# Patient Record
Sex: Female | Born: 1940
Health system: Southern US, Community
[De-identification: ages and names within clinical notes are randomized; demographics above are authoritative.]

## PROBLEM LIST (undated history)

## (undated) DIAGNOSIS — I509 Heart failure, unspecified: Secondary | ICD-10-CM

## (undated) DIAGNOSIS — L570 Actinic keratosis: Secondary | ICD-10-CM

## (undated) DIAGNOSIS — I10 Essential (primary) hypertension: Secondary | ICD-10-CM

## (undated) DIAGNOSIS — J449 Chronic obstructive pulmonary disease, unspecified: Secondary | ICD-10-CM

## (undated) DIAGNOSIS — I2510 Atherosclerotic heart disease of native coronary artery without angina pectoris: Secondary | ICD-10-CM

## (undated) DIAGNOSIS — M1990 Unspecified osteoarthritis, unspecified site: Secondary | ICD-10-CM

## (undated) DIAGNOSIS — C801 Malignant (primary) neoplasm, unspecified: Secondary | ICD-10-CM

## (undated) DIAGNOSIS — J45909 Unspecified asthma, uncomplicated: Secondary | ICD-10-CM

## (undated) DIAGNOSIS — F32A Depression, unspecified: Secondary | ICD-10-CM

## (undated) DIAGNOSIS — F329 Major depressive disorder, single episode, unspecified: Secondary | ICD-10-CM

## (undated) HISTORY — DX: Depression, unspecified: F32.A

## (undated) HISTORY — DX: Unspecified osteoarthritis, unspecified site: M19.90

## (undated) HISTORY — DX: Major depressive disorder, single episode, unspecified: F32.9

## (undated) HISTORY — DX: Actinic keratosis: L57.0

## (undated) HISTORY — DX: Unspecified asthma, uncomplicated: J45.909

## (undated) HISTORY — PX: JOINT REPLACEMENT: SHX530

## (undated) HISTORY — DX: Malignant (primary) neoplasm, unspecified: C80.1

## (undated) HISTORY — PX: CORONARY ARTERY BYPASS GRAFT: SHX141

---

## 1968-05-20 HISTORY — PX: OVARIAN CYST REMOVAL: SHX89

## 1968-05-20 HISTORY — PX: TUBAL LIGATION: SHX77

## 1988-05-20 DIAGNOSIS — C801 Malignant (primary) neoplasm, unspecified: Secondary | ICD-10-CM

## 1988-05-20 HISTORY — PX: BREAST SURGERY: SHX581

## 1988-05-20 HISTORY — DX: Malignant (primary) neoplasm, unspecified: C80.1

## 2017-01-06 LAB — BASIC METABOLIC PANEL
BUN: 15 (ref 4–21)
Creatinine: 0.8 (ref 0.5–1.1)
Glucose: 119
Potassium: 4.6 (ref 3.4–5.3)
Sodium: 140 (ref 137–147)

## 2017-03-17 LAB — TSH: TSH: 2.31 (ref 0.41–5.90)

## 2017-03-17 LAB — VITAMIN D 25 HYDROXY (VIT D DEFICIENCY, FRACTURES): Vit D, 25-Hydroxy: 30

## 2017-03-24 LAB — PTH, INTACT: PTH, Intact: 72

## 2018-04-07 ENCOUNTER — Encounter (INDEPENDENT_AMBULATORY_CARE_PROVIDER_SITE_OTHER): Payer: Self-pay

## 2018-04-07 ENCOUNTER — Ambulatory Visit (INDEPENDENT_AMBULATORY_CARE_PROVIDER_SITE_OTHER): Payer: PPO | Admitting: Family Medicine

## 2018-04-07 ENCOUNTER — Encounter: Payer: Self-pay | Admitting: Family Medicine

## 2018-04-07 VITALS — BP 138/84 | HR 64 | Temp 97.7°F | Resp 16 | Ht 61.0 in | Wt 176.2 lb

## 2018-04-07 DIAGNOSIS — I10 Essential (primary) hypertension: Secondary | ICD-10-CM | POA: Diagnosis not present

## 2018-04-07 DIAGNOSIS — L989 Disorder of the skin and subcutaneous tissue, unspecified: Secondary | ICD-10-CM | POA: Diagnosis not present

## 2018-04-07 DIAGNOSIS — Z9109 Other allergy status, other than to drugs and biological substances: Secondary | ICD-10-CM | POA: Diagnosis not present

## 2018-04-07 DIAGNOSIS — Z853 Personal history of malignant neoplasm of breast: Secondary | ICD-10-CM

## 2018-04-07 DIAGNOSIS — I2510 Atherosclerotic heart disease of native coronary artery without angina pectoris: Secondary | ICD-10-CM

## 2018-04-07 DIAGNOSIS — J4530 Mild persistent asthma, uncomplicated: Secondary | ICD-10-CM

## 2018-04-07 DIAGNOSIS — F339 Major depressive disorder, recurrent, unspecified: Secondary | ICD-10-CM | POA: Diagnosis not present

## 2018-04-07 DIAGNOSIS — Z23 Encounter for immunization: Secondary | ICD-10-CM

## 2018-04-07 HISTORY — DX: Personal history of malignant neoplasm of breast: Z85.3

## 2018-04-07 MED ORDER — AZELASTINE HCL 0.15 % NA SOLN: 1.0000 | Freq: Every day | NASAL | 1 refills | Status: DC | PRN

## 2018-04-07 MED ORDER — ALBUTEROL SULFATE HFA 108 (90 BASE) MCG/ACT IN AERS: 2.0000 | INHALATION_SPRAY | RESPIRATORY_TRACT | 2 refills | Status: DC | PRN

## 2018-04-07 MED ORDER — LISINOPRIL 20 MG PO TABS: 20.0000 mg | ORAL_TABLET | Freq: Two times a day (BID) | ORAL | 3 refills | Status: DC

## 2018-04-07 MED ORDER — ALBUTEROL SULFATE (2.5 MG/3ML) 0.083% IN NEBU: 2.5000 mg | INHALATION_SOLUTION | Freq: Three times a day (TID) | RESPIRATORY_TRACT | 2 refills | Status: DC | PRN

## 2018-04-07 MED ORDER — FLUTICASONE FUROATE-VILANTEROL 200-25 MCG/INH IN AEPB: 1.0000 | INHALATION_SPRAY | Freq: Every day | RESPIRATORY_TRACT | 5 refills | Status: DC

## 2018-04-07 MED ORDER — TIOTROPIUM BROMIDE MONOHYDRATE 2.5 MCG/ACT IN AERS: 2.0000 | INHALATION_SPRAY | Freq: Every day | RESPIRATORY_TRACT | 3 refills | Status: DC

## 2018-04-07 MED ORDER — SERTRALINE HCL 25 MG PO TABS: 25.0000 mg | ORAL_TABLET | Freq: Every day | ORAL | 3 refills | Status: DC

## 2018-04-07 NOTE — Assessment & Plan Note (Signed)
Not currently on Statin. Will discuss at next visit

## 2018-04-07 NOTE — Assessment & Plan Note (Addendum)
When on controllers does well - no need for rescue. Worse with any illness or allergy trigger. Moved due to possible need for O2 in [place]. Refill medications. Obtain Pulm records. Pulse Ox 98% on RA

## 2018-04-07 NOTE — Assessment & Plan Note (Signed)
Declined any screening.

## 2018-04-07 NOTE — Patient Instructions (Addendum)
You will get a phone call from our referral coordinator   It was great to meet you today!  Restart your Zoloft  If thoughts about suicide you said you would 1. Talk to your daughter Dennis Bast can also call the clinic or the Suicide Hotline

## 2018-04-07 NOTE — Assessment & Plan Note (Addendum)
Off medication x 3 months and noticing symptoms returning. Restart zoloft. Has safety plan. Return in 6 weeks for re-evaluation

## 2018-04-07 NOTE — Progress Notes (Signed)
Subjective:     Nancy Merritt is a 77 y.o. female presenting for [place] (previous PCP in [place].) and Medication Refill (needs all of her medications refilled)     HPI  #Asthma/COPD - was living with daughter and the altitude of [place] and her doctor wanted to put her on O2 but she didn't want that - can walk 1/2 mile - stairs are difficult - was seeing a specialist - but has reached a stable medication and feels  - doing well on controllers  #HTN #S/p CABG - went to the ER 2 times for chest pain which was non-cardiac related - no HA - has been more than a year since ER visit - had a normal cardiac work-up through the ER - no recent visits to cardiology >1 year ago - not currently on statin - will discuss at next visit or review records    #Sore on back - started 3-4 years ago - started like pimple, thought ingrown hair - never healed - seems to be growing in size - irritating, but no painful - more itchy - no hx of skin cancer for self or family  #depression - has been out of medication and would like to restart - no thoughts of wanting to hurt herself  Review of Systems  Constitutional: Negative for chills and fever.  HENT: Negative for congestion and sore throat.   Eyes: Negative.   Respiratory: Positive for cough. Negative for chest tightness and shortness of breath.   Cardiovascular: Negative for chest pain, palpitations and leg swelling.  Gastrointestinal: Negative.   Endocrine: Negative.   Genitourinary: Negative.   Musculoskeletal: Negative.   Skin: Positive for wound (see HPI).  Allergic/Immunologic: Negative.   Neurological: Negative for dizziness and light-headedness.  Hematological: Bruises/bleeds easily (skin is thinner).  Psychiatric/Behavioral: Negative for confusion and decreased concentration. The patient is nervous/anxious (depressed mood).    Past Medical History:  Diagnosis Date  . Arthritis   . Asthma   .  Cancer ([place]) 1990  . Depression    Past Surgical History:  Procedure Laterality Date  . BREAST SURGERY Left 1990  . CORONARY ARTERY BYPASS GRAFT    . JOINT REPLACEMENT Right    Hip  . OVARIAN CYST REMOVAL  1970  . TUBAL LIGATION  1970   Family History  Problem Relation Age of Onset  . Cancer Father        lung  . Heart attack Father 27  . Alcohol abuse Father   . Arthritis Father   . COPD Father   . Diabetes Father   . Hypertension Father   . Breast cancer Sister 54  . Arthritis Sister   . Cancer Sister   . COPD Sister   . Drug abuse Sister   . Hypertension Sister   . Miscarriages / Stillbirths Sister   . Thyroid disease Sister   . Arthritis Mother   . COPD Mother   . Diabetes Mother   . Hearing loss Mother   . Hypertension Mother   . Miscarriages / [place] Mother   . Stroke Mother   . Alcohol abuse Brother   . Arthritis Brother   . Hypertension Brother   . Arthritis Daughter   . Asthma Daughter   . Depression Daughter   . Hypertension Daughter   . Miscarriages / [place] Daughter   . Thyroid disease Daughter   . Arthritis Maternal Grandmother   . Hypertension Maternal Grandmother   . Stroke Maternal Grandmother   .  Thyroid disease Maternal Grandmother   . Alcohol abuse Maternal Grandfather   . Arthritis Maternal Grandfather   . Cancer Maternal Grandfather   . Depression Maternal Grandfather   . Hearing loss Maternal Grandfather   . Hypertension Maternal Grandfather   . Arthritis Sister   . Thyroid disease Sister   . Thyroid disease Daughter   . Hypertension Daughter   . Depression Daughter   . COPD Daughter   . Alcohol abuse Daughter   . Arthritis Daughter   . Arthritis Daughter   . COPD Daughter   . Depression Daughter   . Hearing loss Daughter   . Hypertension Daughter   . Thyroid disease Daughter     Social History   Tobacco Use  . Smoking status: Former Smoker    Packs/day: 1.00    Years: 40.00    Pack years: 40.00    Types:  Cigarettes    Last attempt to quit: 2002    Years since quitting: 17.8  . Smokeless tobacco: Never Used  Substance Use Topics  . Alcohol use: Not Currently  . Drug use: Never       Objective:    BP Readings from Last 3 Encounters:  04/07/18 138/84   Wt Readings from Last 3 Encounters:  04/07/18 176 lb 4 oz (79.9 kg)    BP 138/84   Pulse 64   Temp 97.7 F (36.5 C)   Resp 16   Ht 5\' 1"  (1.549 m)   Wt 176 lb 4 oz (79.9 kg)   SpO2 98%   BMI 33.30 kg/m    Physical Exam  Constitutional: She appears well-developed and well-nourished. No distress.  HENT:  Head: Normocephalic and atraumatic.  Eyes: Conjunctivae and EOM are normal. No scleral icterus.  Neck: Neck supple.  Cardiovascular: Normal rate and regular rhythm.  No murmur heard. Pulmonary/Chest: Effort normal. No respiratory distress. She has wheezes (scattered expiratory).  Neurological: She is alert.  Skin: Skin is warm and dry. Capillary refill takes less than 2 seconds. She is not diaphoretic.  ~1.5 x 3 cm red to pink lesion with some areas of poor healing.   Psychiatric: She has a normal mood and affect.      Office Visit from 04/07/2018 in [facility] at [facility]  PHQ-9 Total Score  18           Assessment & Plan:   Problem List Items Addressed This Visit      Cardiovascular and Mediastinum   Hypertension   Relevant Medications   lisinopril (PRINIVIL,ZESTRIL) 20 MG tablet   CAD (coronary artery disease)    Not currently on Statin. Will discuss at next visit      Relevant Medications   lisinopril (PRINIVIL,ZESTRIL) 20 MG tablet     Respiratory   Asthma - Primary    When on controllers does well - no need for rescue. Worse with any illness or allergy trigger. Moved due to possible need for O2 in [place]. Refill medications. Obtain Pulm records. Pulse Ox 98% on RA       Relevant Medications   albuterol (PROVENTIL HFA;VENTOLIN HFA) 108 (90 Base) MCG/ACT inhaler   albuterol  (PROVENTIL) (2.5 MG/3ML) 0.083% nebulizer solution   fluticasone furoate-vilanterol (BREO ELLIPTA) 200-25 MCG/INH AEPB   Tiotropium Bromide Monohydrate (SPIRIVA RESPIMAT) 2.5 MCG/ACT AERS     Musculoskeletal and Integument   Skin lesion of back    Concerning for BCC. Referral to derm      Relevant Orders  Ambulatory referral to Dermatology     Other   Depression, recurrent ([place])    Off medication x 3 months and noticing symptoms returning. Restart zoloft. Has safety plan. Return in 6 weeks for re-evaluation      Relevant Medications   sertraline (ZOLOFT) 25 MG tablet   Hx of breast cancer    Declined any screening.       Other Visit Diagnoses    Environmental allergies       Relevant Medications   Azelastine HCl 0.15 % SOLN   Need for immunization against influenza       Relevant Orders   Flu vaccine HIGH DOSE PF (Fluzone High dose) (Completed)       Return in about 6 weeks (around 05/19/2018) for depression.  Lesleigh Noe, MD

## 2018-04-07 NOTE — Assessment & Plan Note (Signed)
Concerning for BCC. Referral to derm

## 2018-04-13 ENCOUNTER — Encounter: Payer: Self-pay | Admitting: Family Medicine

## 2018-04-13 DIAGNOSIS — E210 Primary hyperparathyroidism: Secondary | ICD-10-CM | POA: Insufficient documentation

## 2018-04-15 ENCOUNTER — Telehealth: Payer: Self-pay | Admitting: Family Medicine

## 2018-04-15 NOTE — Telephone Encounter (Signed)
Rec'd from [facility] forwarded 29 pages to Dr. Lesleigh Noe MD

## 2018-04-20 LAB — CALCIUM: CALCIUM: 10.8

## 2018-05-26 ENCOUNTER — Encounter: Payer: Self-pay | Admitting: Family Medicine

## 2018-05-26 ENCOUNTER — Ambulatory Visit (INDEPENDENT_AMBULATORY_CARE_PROVIDER_SITE_OTHER): Payer: PPO | Admitting: Family Medicine

## 2018-05-26 VITALS — BP 128/72 | HR 70 | Temp 97.7°F | Ht 61.0 in | Wt 174.8 lb

## 2018-05-26 DIAGNOSIS — I10 Essential (primary) hypertension: Secondary | ICD-10-CM

## 2018-05-26 DIAGNOSIS — F339 Major depressive disorder, recurrent, unspecified: Secondary | ICD-10-CM | POA: Diagnosis not present

## 2018-05-26 DIAGNOSIS — J4530 Mild persistent asthma, uncomplicated: Secondary | ICD-10-CM

## 2018-05-26 MED ORDER — SERTRALINE HCL 25 MG PO TABS: 25.0000 mg | ORAL_TABLET | Freq: Every day | ORAL | 3 refills | Status: DC

## 2018-05-26 NOTE — Progress Notes (Signed)
Subjective:     Nancy Merritt is a 78 y.o. female presenting for Depression (6 week follow up)     HPI  #Depression - feels like the zoloft is working well - no side effects  - has noticed improvement, and feels better on this dose - stopped it for a few years successfully but symptoms returned  #Asthma - has noticed more cough in the wet climate - is needing her rescue inhaler more often - does not feel that she has a cold - is using the rescue 1 time a day  #Sore on shoulder - has derm appointment in February   Review of Systems  HENT: Negative for congestion, sinus pain and sneezing.   Respiratory: Positive for cough. Negative for shortness of breath.      04/07/2018: Clinic - Restarted zoloft for depression  Social History   Tobacco Use  Smoking Status Former Smoker  . Packs/day: 1.00  . Years: 40.00  . Pack years: 40.00  . Types: Cigarettes  . Last attempt to quit: 2002  . Years since quitting: 18.0  Smokeless Tobacco Never Used        Objective:    BP Readings from Last 3 Encounters:  05/26/18 128/72  04/07/18 138/84   Wt Readings from Last 3 Encounters:  05/26/18 174 lb 12 oz (79.3 kg)  04/07/18 176 lb 4 oz (79.9 kg)    BP 128/72   Pulse 70   Temp 97.7 F (36.5 C)   Ht 5\' 1"  (1.549 m)   Wt 174 lb 12 oz (79.3 kg)   SpO2 98%   BMI 33.02 kg/m    Physical Exam Constitutional:      General: She is not in acute distress.    Appearance: She is well-developed. She is not diaphoretic.  HENT:     Right Ear: External ear normal.     Left Ear: External ear normal.     Nose: Nose normal.  Eyes:     Conjunctiva/sclera: Conjunctivae normal.  Neck:     Musculoskeletal: Neck supple.  Cardiovascular:     Rate and Rhythm: Normal rate and regular rhythm.     Heart sounds: Murmur present.  Pulmonary:     Effort: Pulmonary effort is normal. No respiratory distress.     Breath sounds: Wheezing and rhonchi present. No rales.  Skin:  General: Skin is warm and dry.     Capillary Refill: Capillary refill takes less than 2 seconds.  Neurological:     Mental Status: She is alert. Mental status is at baseline.  Psychiatric:        Mood and Affect: Mood normal.        Behavior: Behavior normal.       Depression screen [facility] 2/9 05/26/2018 04/07/2018  Decreased Interest 0 3  Down, Depressed, Hopeless 0 3  PHQ - 2 Score 0 6  Altered sleeping 1 3  Tired, decreased energy 2 3  Change in appetite 0 3  Feeling bad or failure about yourself  0 1  Trouble concentrating 1 2  Moving slowly or fidgety/restless 0 0  Suicidal thoughts 0 0  PHQ-9 Score 4 18  Difficult doing work/chores Somewhat difficult Somewhat difficult       Assessment & Plan:   Problem List Items Addressed This Visit      Cardiovascular and Mediastinum   Hypertension    BP at goal on current medication.         Respiratory   Asthma -  Primary    Worsening symptoms as in new environment. Previously followed by pulm with potential need for O2. SpO2 is good today. Will refer. Also discussed trial of increased albuterol x 1-2 days to see if that helps with symptoms. No sign of exacerbation.       Relevant Orders   Ambulatory referral to Pulmonology     Other   Depression, recurrent ([place])    Significant improvement with PHQ-9 from 18>4 since starting medication. Refill provided.       Relevant Medications   sertraline (ZOLOFT) 25 MG tablet       Return in about 6 months (around 11/24/2018).  Lesleigh Noe, MD

## 2018-05-26 NOTE — Assessment & Plan Note (Signed)
Worsening symptoms as in new environment. Previously followed by pulm with potential need for O2. SpO2 is good today. Will refer. Also discussed trial of increased albuterol x 1-2 days to see if that helps with symptoms. No sign of exacerbation.

## 2018-05-26 NOTE — Patient Instructions (Signed)
#  Depression  - I'm glad you are doing better with the zoloft, continue taking the medication  #Asthma - try using albuterol 3 times a day for the next 1-2 days to see if that helps - pulmonology referral -- go see the coordinator today  #Blood pressure - looks great! Keep taking the medication  #Lesion on shoulder - I'm glad you have a dermatology appointment - have them send records from your visit to me

## 2018-05-26 NOTE — Assessment & Plan Note (Signed)
BP at goal on current medication.

## 2018-05-26 NOTE — Assessment & Plan Note (Signed)
Significant improvement with PHQ-9 from 18>4 since starting medication. Refill provided.

## 2018-05-27 ENCOUNTER — Telehealth: Payer: Self-pay

## 2018-05-27 ENCOUNTER — Ambulatory Visit (INDEPENDENT_AMBULATORY_CARE_PROVIDER_SITE_OTHER): Payer: PPO | Admitting: Pulmonary Disease

## 2018-05-27 ENCOUNTER — Encounter: Payer: Self-pay | Admitting: Pulmonary Disease

## 2018-05-27 VITALS — BP 120/80 | HR 92 | Ht 61.0 in | Wt 175.4 lb

## 2018-05-27 DIAGNOSIS — J4530 Mild persistent asthma, uncomplicated: Secondary | ICD-10-CM | POA: Diagnosis not present

## 2018-05-27 MED ORDER — FLUTICASONE PROPIONATE HFA 44 MCG/ACT IN AERO: 2.0000 | INHALATION_SPRAY | Freq: Two times a day (BID) | RESPIRATORY_TRACT | 5 refills | Status: DC | PRN

## 2018-05-27 NOTE — Telephone Encounter (Signed)
Reached out to [facility] office and they do not have a record of Bone density, colonoscopy or mammogram records.

## 2018-05-27 NOTE — Patient Instructions (Addendum)
Mild Persistent Asthma Uncontrolled symptoms. Not in active exacerbation.   --CONTINUE Breo 200-25 mcg one puff daily --CONTINUE Spiriva Respimat 2.5 mcg two puffs twice daily --START Flovent 44 mcg 2 puffs as needed up to twice a day for shortness of breath or wheezing --OK TO CONTINUE Albuterol inhaler 2 puffs every 4 hours as needed for shortness of breath or wheezing  Please call if you are using your inhalers as instructed but are still having worsening symptoms.

## 2018-05-27 NOTE — Progress Notes (Signed)
Synopsis: Referred in 05/2018 for asthma  Subjective:   [name] GENDER: female DOB: February 12, 1941, MRN: [id number]   HPI  Chief Complaint  Patient presents with  . Consult    hx asthma - moved here recently from [place] - noticed more coughing since moving here   Nancy Merritt is a 78 year old female former smoker with childhood asthma, hypertension and depression who presents as a new consult for asthma.  She recently moved from [place] to [place] and is experiencing worsening asthma symptoms. Has been on Breo and Spiriva since 1.5-2 years and seemed to effective before she moved.  Does not have symptoms in the morning. During the day she starts having mild wheezing and productive cough with thin yellow secretions. No shortness of breath. Worsens with rainy weather, humidity. She uses her albuterol once a day with relief. Reports post nasal drip. She takes claritin. No exacerbations in the last year.  Social History: 40-pack-year smoking history.  Quit 18 years ago  Environmental exposures: None  2019 Jan Feb March April May June July Aug Sept Oct Nov Dec                2020 Feb Feb March April May June July Aug Sept Oct Nov Dec                 ACT-2  I have personally reviewed patient's past medical/family/social history, allergies, current medications.  Past Medical History:  Diagnosis Date  . Arthritis   . Asthma   . Cancer ([place]) 1990  . Depression      Family History  Problem Relation Age of Onset  . Cancer Father        lung  . Heart attack Father 5  . Alcohol abuse Father   . Arthritis Father   . COPD Father   . Diabetes Father   . Hypertension Father   . Breast cancer Sister 61  . Arthritis Sister   . Cancer Sister   . COPD Sister   . Drug abuse Sister   . Hypertension Sister   . Miscarriages / Stillbirths Sister   . Thyroid disease Sister   . Arthritis Mother   . COPD Mother   . Diabetes Mother   . Hearing loss Mother     . Hypertension Mother   . Miscarriages / [place] Mother   . Stroke Mother   . Alcohol abuse Brother   . Arthritis Brother   . Hypertension Brother   . Arthritis Daughter   . Asthma Daughter   . Depression Daughter   . Hypertension Daughter   . Miscarriages / [place] Daughter   . Thyroid disease Daughter   . Arthritis Maternal Grandmother   . Hypertension Maternal Grandmother   . Stroke Maternal Grandmother   . Thyroid disease Maternal Grandmother   . Alcohol abuse Maternal Grandfather   . Arthritis Maternal Grandfather   . Cancer Maternal Grandfather   . Depression Maternal Grandfather   . Hearing loss Maternal Grandfather   . Hypertension Maternal Grandfather   . Arthritis Sister   . Thyroid disease Sister   . Thyroid disease Daughter   . Hypertension Daughter   . Depression Daughter   . COPD Daughter   . Alcohol abuse Daughter   . Arthritis Daughter   . Arthritis Daughter   . COPD Daughter   . Depression Daughter   . Hearing loss Daughter   . Hypertension Daughter   . Thyroid disease Daughter  Social History   Occupational History  . Not on file  Tobacco Use  . Smoking status: Former Smoker    Packs/day: 1.00    Years: 40.00    Pack years: 40.00    Types: Cigarettes    Last attempt to quit: 2002    Years since quitting: 18.0  . Smokeless tobacco: Never Used  Substance and Sexual Activity  . Alcohol use: Not Currently  . Drug use: Never  . Sexual activity: Not Currently    Allergies  Allergen Reactions  . Advair Diskus [Fluticasone-Salmeterol] Shortness Of Breath    As of 09/01/2014  . Levofloxacin     Nausea, brain fog, depression, as of 12/12/14  . Qvar [Beclomethasone]     Lungs closing-as of 05/03/2015  . Singulair [Montelukast] Shortness Of Breath  . Symbicort [Budesonide-Formoterol Fumarate]     Hard time breathing as of 10/31/2014.     Outpatient Medications Prior to Visit  Medication Sig Dispense Refill  . albuterol  (PROVENTIL HFA;VENTOLIN HFA) 108 (90 Base) MCG/ACT inhaler Inhale 2 puffs into the lungs every 4 (four) hours as needed for wheezing or shortness of breath. 1 Inhaler 2  . albuterol (PROVENTIL) (2.5 MG/3ML) 0.083% nebulizer solution Take 3 mLs (2.5 mg total) by nebulization 3 (three) times daily as needed. 75 mL 2  . Azelastine HCl 0.15 % SOLN Place 1 spray into the nose daily as needed. 30 mL 1  . Flaxseed, Linseed, (FLAX SEEDS PO) Take 1 tablet by mouth daily.    . fluticasone furoate-vilanterol (BREO ELLIPTA) 200-25 MCG/INH AEPB Inhale 1 puff into the lungs daily. 60 each 5  . ibuprofen (ADVIL,MOTRIN) 200 MG tablet Take 200 mg by mouth every 6 (six) hours as needed.    Marland Kitchen lisinopril (PRINIVIL,ZESTRIL) 20 MG tablet Take 1 tablet (20 mg total) by mouth 2 (two) times daily. 180 tablet 3  . loratadine (CLARITIN) 10 MG tablet Take 10 mg by mouth daily.    . Multiple Vitamins-Minerals (CENTRUM SILVER PO) Take 1 tablet by mouth daily.    . sertraline (ZOLOFT) 25 MG tablet Take 1 tablet (25 mg total) by mouth daily. 90 tablet 3  . Tiotropium Bromide Monohydrate (SPIRIVA RESPIMAT) 2.5 MCG/ACT AERS Inhale 2 puffs into the lungs daily. 3 Inhaler 3   No facility-administered medications prior to visit.     Review of Systems  Constitutional: Negative for chills, diaphoresis, fever, malaise/fatigue and weight loss.  HENT: Positive for congestion. Negative for sore throat.   Respiratory: Positive for cough, sputum production, shortness of breath and wheezing. Negative for hemoptysis.   Cardiovascular: Negative for chest pain, orthopnea, leg swelling and PND.  Gastrointestinal: Negative for abdominal pain, heartburn and nausea.  Genitourinary: Negative for frequency.  Musculoskeletal: Negative for myalgias.  Skin: Negative for rash.  Neurological: Negative for dizziness, weakness and headaches.  Endo/Heme/Allergies: Does not bruise/bleed easily.     Objective:   Vitals:   05/27/18 1339  BP: 120/80   Pulse: 92  SpO2: 93%  Weight: 175 lb 6.4 oz (79.6 kg)  Height: 5\' 1"  (1.549 m)   SpO2: 93 % O2 Device: None (Room air)  Physical Exam General: Well-appearing, no acute distress HENT: Nancy Merritt, AT, OP with cobblestoning, MMM Eyes: EOMI, no scleral icterus Respiratory: Clear to auscultation bilaterally.  No crackles, wheezing or rales Cardiovascular: RRR, -M/R/G, no JVD GI: BS+, soft, nontender Extremities:-Edema,-tenderness Neuro: AAO x4, CNII-XII grossly intact Skin: Intact, no rashes or bruising Psych: Normal mood, normal affect  Chest imaging: No available  chest imaging for review  PFT: 07/17/2015 FEV1/FVC 54 FVC 1.96 (86%) FEV1 1.24 (73%).  Significant increase post-BD.  Normal lung volumes.  Normal DLCO Imaging, labs and test noted above have been reviewed independently by me.    Assessment & Plan:   Mild Persistent Asthma Allergic Rhinitis.  Discussion: 78 year old female compliant with current asthma therapy. Has previously tried Symbicort, Qvar and Advair and did not tolerate.  Mild Persistent Asthma Uncontrolled symptoms. Not in active exacerbation.   --CONTINUE Breo 200-25 mcg one puff daily --CONTINUE Spiriva Respimat 2.5 mcg two puffs twice daily --START Flovent 44 mcg 2 puffs as needed up to twice a day for shortness of breath or wheezing --OK TO CONTINUE Albuterol inhaler 2 puffs every 4 hours as needed for shortness of breath or wheezing  Allergic Rhinitis --CONTINUE nasal spray daily as needed --CONTINUE claritin daily  Please call if you are using your inhalers as instructed but are still having worsening symptoms.  No orders of the defined types were placed in this encounter.  Meds ordered this encounter  Medications  . fluticasone (FLOVENT HFA) 44 MCG/ACT inhaler    Sig: Inhale 2 puffs into the lungs 2 (two) times daily as needed.    Dispense:  1 Inhaler    Refill:  5    Return in about 3 months (around 08/26/2018).  Elison Worrel Rodman Pickle,  MD [facility] 05/27/2018 2:19 PM  Personal pager: [phone] If unanswered, please page CCM On-call: [phone]

## 2018-07-07 DIAGNOSIS — L819 Disorder of pigmentation, unspecified: Secondary | ICD-10-CM | POA: Diagnosis not present

## 2018-07-07 DIAGNOSIS — C44519 Basal cell carcinoma of skin of other part of trunk: Secondary | ICD-10-CM | POA: Diagnosis not present

## 2018-07-07 DIAGNOSIS — D485 Neoplasm of uncertain behavior of skin: Secondary | ICD-10-CM | POA: Diagnosis not present

## 2018-07-07 DIAGNOSIS — C44612 Basal cell carcinoma of skin of right upper limb, including shoulder: Secondary | ICD-10-CM | POA: Diagnosis not present

## 2018-08-26 ENCOUNTER — Ambulatory Visit: Payer: PPO | Admitting: Pulmonary Disease

## 2018-09-01 ENCOUNTER — Ambulatory Visit: Payer: PPO

## 2018-09-08 ENCOUNTER — Encounter: Payer: PPO | Admitting: Family Medicine

## 2018-10-29 ENCOUNTER — Other Ambulatory Visit: Payer: Self-pay | Admitting: Family Medicine

## 2018-10-29 DIAGNOSIS — I10 Essential (primary) hypertension: Secondary | ICD-10-CM

## 2018-10-29 NOTE — Addendum Note (Signed)
Addended by: Ellamae Sia on: 10/29/2018 01:57 PM   Modules accepted: Orders

## 2018-10-29 NOTE — Progress Notes (Signed)
Orders for annual  

## 2018-11-02 ENCOUNTER — Ambulatory Visit: Payer: PPO

## 2018-11-02 ENCOUNTER — Other Ambulatory Visit (INDEPENDENT_AMBULATORY_CARE_PROVIDER_SITE_OTHER): Payer: PPO

## 2018-11-02 DIAGNOSIS — I10 Essential (primary) hypertension: Secondary | ICD-10-CM

## 2018-11-02 LAB — BASIC METABOLIC PANEL
BUN: 15 mg/dL (ref 6–23)
CO2: 29 mEq/L (ref 19–32)
Calcium: 10.8 mg/dL — ABNORMAL HIGH (ref 8.4–10.5)
Chloride: 107 mEq/L (ref 96–112)
Creatinine, Ser: 0.83 mg/dL (ref 0.40–1.20)
GFR: 66.19 mL/min (ref >60.00)
Glucose, Bld: 96 mg/dL (ref 70–99)
Potassium: 5.6 mEq/L — ABNORMAL HIGH (ref 3.5–5.1)
Sodium: 140 mEq/L (ref 135–145)

## 2018-11-02 LAB — LIPID PANEL
Cholesterol: 188 mg/dL (ref 0–200)
HDL: 53.2 mg/dL (ref >39.00)
LDL Cholesterol: 111 mg/dL — ABNORMAL HIGH (ref 0–99)
NonHDL: 134.82
Total CHOL/HDL Ratio: 4
Triglycerides: 120 mg/dL (ref 0.0–149.0)
VLDL: 24 mg/dL (ref 0.0–40.0)

## 2018-11-09 ENCOUNTER — Telehealth: Payer: Self-pay | Admitting: Family Medicine

## 2018-11-09 ENCOUNTER — Ambulatory Visit (INDEPENDENT_AMBULATORY_CARE_PROVIDER_SITE_OTHER): Payer: PPO | Admitting: Family Medicine

## 2018-11-09 ENCOUNTER — Other Ambulatory Visit: Payer: Self-pay

## 2018-11-09 ENCOUNTER — Encounter: Payer: Self-pay | Admitting: Family Medicine

## 2018-11-09 VITALS — BP 148/80 | HR 71 | Temp 98.1°F | Resp 20 | Ht 60.5 in | Wt 179.2 lb

## 2018-11-09 DIAGNOSIS — Z7189 Other specified counseling: Secondary | ICD-10-CM | POA: Insufficient documentation

## 2018-11-09 DIAGNOSIS — Z0000 Encounter for general adult medical examination without abnormal findings: Secondary | ICD-10-CM | POA: Diagnosis not present

## 2018-11-09 DIAGNOSIS — I10 Essential (primary) hypertension: Secondary | ICD-10-CM

## 2018-11-09 DIAGNOSIS — E875 Hyperkalemia: Secondary | ICD-10-CM

## 2018-11-09 DIAGNOSIS — F339 Major depressive disorder, recurrent, unspecified: Secondary | ICD-10-CM

## 2018-11-09 DIAGNOSIS — Z23 Encounter for immunization: Secondary | ICD-10-CM

## 2018-11-09 LAB — COMPREHENSIVE METABOLIC PANEL
ALT: 19 U/L (ref 0–35)
AST: 14 U/L (ref 0–37)
Albumin: 4.1 g/dL (ref 3.5–5.2)
Alkaline Phosphatase: 102 U/L (ref 39–117)
BUN: 16 mg/dL (ref 6–23)
CO2: 29 mEq/L (ref 19–32)
Calcium: 10.8 mg/dL — ABNORMAL HIGH (ref 8.4–10.5)
Chloride: 108 mEq/L (ref 96–112)
Creatinine, Ser: 0.81 mg/dL (ref 0.40–1.20)
GFR: 68.08 mL/min (ref >60.00)
Glucose, Bld: 91 mg/dL (ref 70–99)
Potassium: 5.8 mEq/L — ABNORMAL HIGH (ref 3.5–5.1)
Sodium: 141 mEq/L (ref 135–145)
Total Bilirubin: 0.4 mg/dL (ref 0.2–1.2)
Total Protein: 6.3 g/dL (ref 6.0–8.3)

## 2018-11-09 MED ORDER — HYDROCHLOROTHIAZIDE 12.5 MG PO TABS: 12.5000 mg | ORAL_TABLET | Freq: Every day | ORAL | 1 refills | Status: DC

## 2018-11-09 MED ORDER — LISINOPRIL 20 MG PO TABS: 20.0000 mg | ORAL_TABLET | Freq: Every day | ORAL | 3 refills | Status: DC

## 2018-11-09 NOTE — Progress Notes (Signed)
Subjective:   Nancy Merritt is a 78 y.o. female who presents for Medicare Annual (Subsequent) preventive examination.  Review of Systems:  Review of Systems  Constitutional: Negative for chills and fever.  HENT: Positive for congestion. Negative for hearing loss.   Eyes: Negative for blurred vision and double vision.  Respiratory: Negative for cough, sputum production and shortness of breath.   Cardiovascular: Negative for chest pain, palpitations and leg swelling.  Gastrointestinal: Negative for heartburn, nausea and vomiting.  Genitourinary: Negative for dysuria.  Musculoskeletal: Negative for back pain and myalgias.  Skin: Negative for itching and rash.  Neurological: Negative for dizziness and headaches.  Endo/Heme/Allergies: Bruises/bleeds easily.  Psychiatric/Behavioral: Negative for depression. The patient is not nervous/anxious.           Objective:     Vitals: BP (!) 148/80   Pulse 71   Temp 98.1 F (36.7 C)   Resp 20   Ht 5' 0.5" (1.537 m)   Wt 179 lb 4 oz (81.3 kg)   SpO2 96%   BMI 34.43 kg/m   Body mass index is 34.43 kg/m.  No flowsheet data found.  Tobacco Social History   Tobacco Use  Smoking Status Former Smoker  . Packs/day: 1.00  . Years: 40.00  . Pack years: 40.00  . Types: Cigarettes  . Quit date: 2002  . Years since quitting: 18.4  Smokeless Tobacco Never Used     Counseling given: Not Answered    Past Medical History:  Diagnosis Date  . Arthritis   . Asthma   . Cancer ([place]) 1990  . Depression    Past Surgical History:  Procedure Laterality Date  . BREAST SURGERY Left 1990  . CORONARY ARTERY BYPASS GRAFT    . JOINT REPLACEMENT Right    Hip  . OVARIAN CYST REMOVAL  1970  . TUBAL LIGATION  1970   Family History  Problem Relation Age of Onset  . Cancer Father        lung  . Heart attack Father 64  . Alcohol abuse Father   . Arthritis Father   . COPD Father   . Diabetes Father   . Hypertension Father   .  Breast cancer Sister 64  . Arthritis Sister   . Cancer Sister   . COPD Sister   . Drug abuse Sister   . Hypertension Sister   . Miscarriages / Stillbirths Sister   . Thyroid disease Sister   . Arthritis Mother   . COPD Mother   . Diabetes Mother   . Hearing loss Mother   . Hypertension Mother   . Miscarriages / [place] Mother   . Stroke Mother   . Alcohol abuse Brother   . Arthritis Brother   . Hypertension Brother   . Arthritis Daughter   . Asthma Daughter   . Depression Daughter   . Hypertension Daughter   . Miscarriages / [place] Daughter   . Thyroid disease Daughter   . Arthritis Maternal Grandmother   . Hypertension Maternal Grandmother   . Stroke Maternal Grandmother   . Thyroid disease Maternal Grandmother   . Alcohol abuse Maternal Grandfather   . Arthritis Maternal Grandfather   . Cancer Maternal Grandfather   . Depression Maternal Grandfather   . Hearing loss Maternal Grandfather   . Hypertension Maternal Grandfather   . Arthritis Sister   . Thyroid disease Sister   . Thyroid disease Daughter   . Hypertension Daughter   . Depression Daughter   .  COPD Daughter   . Alcohol abuse Daughter   . Arthritis Daughter   . Arthritis Daughter   . COPD Daughter   . Depression Daughter   . Hearing loss Daughter   . Hypertension Daughter   . Thyroid disease Daughter    Social History   Socioeconomic History  . Marital status: Widowed    Spouse name: Not on file  . Number of children: 3  . Years of education: 2 associate degrees  . Highest education level: Not on file  Occupational History  . Not on file  Social Needs  . Financial resource strain: Not hard at all  . Food insecurity    Worry: Not on file    Inability: Not on file  . Transportation needs    Medical: Not on file    Non-medical: Not on file  Tobacco Use  . Smoking status: Former Smoker    Packs/day: 1.00    Years: 40.00    Pack years: 40.00    Types: Cigarettes    Quit date: 2002     Years since quitting: 18.4  . Smokeless tobacco: Never Used  Substance and Sexual Activity  . Alcohol use: Not Currently  . Drug use: Never  . Sexual activity: Not Currently  Lifestyle  . Physical activity    Days per week: Not on file    Minutes per session: Not on file  . Stress: Not on file  Relationships  . Social Herbalist on phone: Not on file    Gets together: Not on file    Attends religious service: Not on file    Active member of club or organization: Not on file    Attends meetings of clubs or organizations: Not on file    Relationship status: Not on file  Other Topics Concern  . 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 albuterol (PROVENTIL HFA;VENTOLIN HFA) 108 (90 Base) MCG/ACT inhaler Inhale 2 puffs into the lungs every 4 (four) hours as needed for wheezing or shortness of breath.  Marland Kitchen albuterol (PROVENTIL) (2.5 MG/3ML) 0.083% nebulizer solution Take 3 mLs (2.5 mg total) by nebulization 3 (three) times daily as needed.  . Azelastine HCl 0.15 % SOLN Place 1 spray into the nose daily as needed.  . Flaxseed, Linseed, (FLAX SEEDS PO) Take 1 tablet by mouth daily.  . fluticasone (FLOVENT HFA) 44 MCG/ACT inhaler Inhale 2 puffs into the lungs 2 (two) times daily as needed.  . fluticasone furoate-vilanterol (BREO ELLIPTA) 200-25 MCG/INH AEPB Inhale 1 puff into the lungs daily.  Marland Kitchen ibuprofen (ADVIL,MOTRIN) 200 MG tablet Take 200 mg by mouth every 6 (six) hours as needed.  Marland Kitchen lisinopril (PRINIVIL,ZESTRIL) 20 MG tablet Take 1 tablet (20 mg total) by mouth  2 (two) times daily.  Marland Kitchen loratadine (CLARITIN) 10 MG tablet Take 10 mg by mouth daily.  . Multiple Vitamins-Minerals (CENTRUM SILVER PO) Take 1 tablet by mouth daily.  . Tiotropium Bromide Monohydrate (SPIRIVA RESPIMAT) 2.5 MCG/ACT AERS Inhale 2 puffs into the lungs  daily.  . [DISCONTINUED] sertraline (ZOLOFT) 25 MG tablet Take 1 tablet (25 mg total) by mouth daily.   No facility-administered encounter medications on file as of 11/09/2018.     Activities of Daily Living No flowsheet data found.   - Able to do all care for herself, cooks for her family - dresses herself - manages her own money  Patient Care Team: Lesleigh Noe, MD as PCP - General (Family Medicine) Margaretha Seeds, MD as Consulting Physician (Pulmonary Disease)  Dentist - needs to get one Eye doctor - does not have       Assessment:   This is a routine wellness examination for [place].  Exercise Activities and Dietary recommendations     Stays active Eats well rounded diet  Fall Risk Fall Risk  11/09/2018 05/26/2018  Falls in the past year? 0 0  Number falls in past yr: 0 0  Injury with Fall? 0 0   Is the patient's home free of loose throw rugs in walkways, pet beds, electrical cords, etc?   no      Grab bars in the bathroom? yes      Handrails on the stairs?   yes      Adequate lighting?   yes  Timed Get Up and Go performed: not indicated, no falls  Depression Screen PHQ 2/9 Scores 11/09/2018 05/26/2018 04/07/2018  PHQ - 2 Score 0 0 6  PHQ- 9 Score 0 4 18     Cognitive Function  Mini-Cog - 11/09/18 1017    Normal clock drawing test?  yes    How many words correct?  3        Immunization History  Administered Date(s) Administered  . Influenza, High Dose Seasonal PF 04/07/2018  . Pneumococcal Conjugate-13 11/09/2018  . Pneumococcal Polysaccharide-23 08/15/2015  . Tdap 08/15/2015  . Zoster 08/15/2015    Qualifies for Shingles Vaccine? yes  Screening Tests Health Maintenance  Topic  Date Due  . PNA vac Low Risk Adult (2 of 2 - PCV13) 08/14/2016  . DEXA SCAN  11/09/2019 (Originally 02/06/2004)  . INFLUENZA VACCINE  12/19/2018  . TETANUS/TDAP  08/14/2025    Cancer Screenings: Lung: Low Dose CT Chest recommended if Age 64-80 years, 30 pack-year currently smoking OR have quit w/in 15years. Patient does not qualify. Breast:  Up to date on Mammogram? No   Up to date of Bone Density/Dexa? No -- declined screening Colorectal: n/a due to age  Additional Screenings: : Hepatitis C Screening: already done   Lab Results  Component Value Date   CHOL 188 11/02/2018   HDL 53.20 11/02/2018   LDLCALC 111 (H) 11/02/2018   TRIG 120.0 11/02/2018   CHOLHDL 4 11/02/2018    The 10-year ASCVD risk score Mikey Bussing DC Jr., et al., 2013) is: 39.2%   Values used to calculate the score:     Age: 55 years     Sex: Female     Is Non-Hispanic African American: No     Diabetic: No     Tobacco smoker: No     Systolic Blood Pressure: 767 mmHg     Is BP treated: Yes     HDL Cholesterol: 53.2 mg/dL     Total Cholesterol: 188 mg/dL  Was on statin for a while after her heart surgery. Does not feel the benefits are great.      Plan:       I have personally reviewed and noted the following in the patient's chart:   .  Medical and social history . Use of alcohol, tobacco or illicit drugs - not using . Current medications and supplements - reviewed. Discussed risk of ibuprofen and advised tylenol. Pt understands risk and using low dose.  . Functional ability and status - good, independent . Nutritional status - good . Physical activity - good . Advanced directives -- given paperwork and advised discussing with daughter . List of other physicians -- updated, though advised finding dentist . Hospitalizations, surgeries, and ER visits in previous 12 months - n/a . Vitals - bp a little elevated, recommended home monitoring . Screenings to include cognitive, depression, and falls -- negative  . Referrals and appointments -- non needed  In addition, I have reviewed and discussed with patient certain preventive protocols, quality metrics, and best practice recommendations. A written personalized care plan for preventive services as well as general preventive health recommendations were provided to patient.     Lesleigh Noe, MD  11/09/2018

## 2018-11-09 NOTE — Telephone Encounter (Signed)
Notified patient of elevated potassium  Advised reducing lisinopril from 20 mg BID > 20 mg once daily  Will add HCTZ 12.5 mg to help with BP control  Advised the ER if cp, palpitations develop  Also told pt to call to schedule lab f/u appointment on Thursday to repeat blood work. Will also plan to order a CBC to rule out other causes of hyperkalemia.

## 2018-11-09 NOTE — Patient Instructions (Signed)
Great to see you today  # Pneumonia shot today  # Advance Directive Form - work through with your daughter - then bring copy to the office to put in your chart  #Blood Pressure - check at home occasionally if higher than 150/90 call the clinic for a follow-up appointment

## 2018-11-09 NOTE — Assessment & Plan Note (Signed)
Remission currently. Using a sunlight lamp. Uses Zoloft seasonally for SAD

## 2018-11-12 NOTE — Telephone Encounter (Signed)
I left msg for pt to call for lab scheduling.

## 2018-11-17 NOTE — Telephone Encounter (Signed)
I left second msg

## 2018-12-25 ENCOUNTER — Other Ambulatory Visit: Payer: Self-pay

## 2018-12-25 DIAGNOSIS — J4530 Mild persistent asthma, uncomplicated: Secondary | ICD-10-CM

## 2018-12-25 MED ORDER — BREO ELLIPTA 200-25 MCG/INH IN AEPB: 1.0000 | INHALATION_SPRAY | Freq: Every day | RESPIRATORY_TRACT | 5 refills | Status: DC

## 2018-12-30 ENCOUNTER — Other Ambulatory Visit: Payer: Self-pay

## 2018-12-30 DIAGNOSIS — J4530 Mild persistent asthma, uncomplicated: Secondary | ICD-10-CM

## 2018-12-30 MED ORDER — BREO ELLIPTA 200-25 MCG/INH IN AEPB: 1.0000 | INHALATION_SPRAY | Freq: Every day | RESPIRATORY_TRACT | 5 refills | Status: DC

## 2018-12-30 NOTE — Telephone Encounter (Signed)
Patient changed pharmacy. Resending Breo inhaler to [facility].

## 2019-05-03 DIAGNOSIS — M542 Cervicalgia: Secondary | ICD-10-CM | POA: Diagnosis not present

## 2019-05-03 DIAGNOSIS — M9901 Segmental and somatic dysfunction of cervical region: Secondary | ICD-10-CM | POA: Diagnosis not present

## 2019-05-03 DIAGNOSIS — M9902 Segmental and somatic dysfunction of thoracic region: Secondary | ICD-10-CM | POA: Diagnosis not present

## 2019-05-03 DIAGNOSIS — M62830 Muscle spasm of back: Secondary | ICD-10-CM | POA: Diagnosis not present

## 2019-05-03 DIAGNOSIS — M9903 Segmental and somatic dysfunction of lumbar region: Secondary | ICD-10-CM | POA: Diagnosis not present

## 2019-05-03 DIAGNOSIS — M62838 Other muscle spasm: Secondary | ICD-10-CM | POA: Diagnosis not present

## 2019-05-03 DIAGNOSIS — M546 Pain in thoracic spine: Secondary | ICD-10-CM | POA: Diagnosis not present

## 2019-05-03 DIAGNOSIS — M545 Low back pain: Secondary | ICD-10-CM | POA: Diagnosis not present

## 2019-05-08 ENCOUNTER — Other Ambulatory Visit: Payer: Self-pay

## 2019-05-08 ENCOUNTER — Emergency Department (HOSPITAL_COMMUNITY): Payer: PPO

## 2019-05-08 ENCOUNTER — Emergency Department (HOSPITAL_COMMUNITY)
Admission: EM | Admit: 2019-05-08 | Discharge: 2019-05-08 | Disposition: A | Discharge status: Home / Self Care | Payer: PPO | Attending: Emergency Medicine | Admitting: Emergency Medicine

## 2019-05-08 DIAGNOSIS — I10 Essential (primary) hypertension: Secondary | ICD-10-CM | POA: Diagnosis not present

## 2019-05-08 DIAGNOSIS — J45909 Unspecified asthma, uncomplicated: Secondary | ICD-10-CM | POA: Insufficient documentation

## 2019-05-08 DIAGNOSIS — Z853 Personal history of malignant neoplasm of breast: Secondary | ICD-10-CM | POA: Insufficient documentation

## 2019-05-08 DIAGNOSIS — Z79899 Other long term (current) drug therapy: Secondary | ICD-10-CM | POA: Diagnosis not present

## 2019-05-08 DIAGNOSIS — I2510 Atherosclerotic heart disease of native coronary artery without angina pectoris: Secondary | ICD-10-CM | POA: Insufficient documentation

## 2019-05-08 DIAGNOSIS — R079 Chest pain, unspecified: Secondary | ICD-10-CM | POA: Diagnosis not present

## 2019-05-08 DIAGNOSIS — Z87891 Personal history of nicotine dependence: Secondary | ICD-10-CM | POA: Diagnosis not present

## 2019-05-08 DIAGNOSIS — Z951 Presence of aortocoronary bypass graft: Secondary | ICD-10-CM | POA: Insufficient documentation

## 2019-05-08 DIAGNOSIS — J441 Chronic obstructive pulmonary disease with (acute) exacerbation: Secondary | ICD-10-CM | POA: Insufficient documentation

## 2019-05-08 DIAGNOSIS — Z20828 Contact with and (suspected) exposure to other viral communicable diseases: Secondary | ICD-10-CM | POA: Insufficient documentation

## 2019-05-08 DIAGNOSIS — R0602 Shortness of breath: Secondary | ICD-10-CM | POA: Diagnosis not present

## 2019-05-08 LAB — POCT I-STAT EG7
Bicarbonate: 24.4 mmol/L (ref 20.0–28.0)
Calcium, Ion: 1.31 mmol/L (ref 1.15–1.40)
HCT: 43 % (ref 36.0–46.0)
Hemoglobin: 14.6 g/dL (ref 12.0–15.0)
O2 Saturation: 92 %
Potassium: 3.6 mmol/L (ref 3.5–5.1)
Sodium: 141 mmol/L (ref 135–145)
TCO2: 26 mmol/L (ref 22–32)
pCO2, Ven: 38.4 mmHg — ABNORMAL LOW (ref 44.0–60.0)
pH, Ven: 7.412 (ref 7.250–7.430)
pO2, Ven: 62 mmHg — ABNORMAL HIGH (ref 32.0–45.0)

## 2019-05-08 LAB — BRAIN NATRIURETIC PEPTIDE: B Natriuretic Peptide: 62 pg/mL (ref 0.0–100.0)

## 2019-05-08 LAB — CBC WITH DIFFERENTIAL/PLATELET
Abs Immature Granulocytes: 0.03 10*3/uL (ref 0.00–0.07)
Basophils Absolute: 0 10*3/uL (ref 0.0–0.1)
Basophils Relative: 0 %
Eosinophils Absolute: 0.2 10*3/uL (ref 0.0–0.5)
Eosinophils Relative: 2 %
HCT: 44.2 % (ref 36.0–46.0)
Hemoglobin: 14.7 g/dL (ref 12.0–15.0)
Immature Granulocytes: 0 %
Lymphocytes Relative: 19 %
Lymphs Abs: 2.2 10*3/uL (ref 0.7–4.0)
MCH: 30.1 pg (ref 26.0–34.0)
MCHC: 33.3 g/dL (ref 30.0–36.0)
MCV: 90.6 fL (ref 80.0–100.0)
Monocytes Absolute: 0.5 10*3/uL (ref 0.1–1.0)
Monocytes Relative: 4 %
Neutro Abs: 8.8 10*3/uL — ABNORMAL HIGH (ref 1.7–7.7)
Neutrophils Relative %: 75 %
Platelets: 544 10*3/uL — ABNORMAL HIGH (ref 150–400)
RBC: 4.88 MIL/uL (ref 3.87–5.11)
RDW: 13.3 % (ref 11.5–15.5)
WBC: 11.8 10*3/uL — ABNORMAL HIGH (ref 4.0–10.5)
nRBC: 0 % (ref 0.0–0.2)

## 2019-05-08 LAB — COMPREHENSIVE METABOLIC PANEL
ALT: 25 U/L (ref 0–44)
AST: 19 U/L (ref 15–41)
Albumin: 3.6 g/dL (ref 3.5–5.0)
Alkaline Phosphatase: 96 U/L (ref 38–126)
Anion gap: 12 (ref 5–15)
BUN: 13 mg/dL (ref 8–23)
CO2: 22 mmol/L (ref 22–32)
Calcium: 10.7 mg/dL — ABNORMAL HIGH (ref 8.9–10.3)
Chloride: 109 mmol/L (ref 98–111)
Creatinine, Ser: 0.96 mg/dL (ref 0.44–1.00)
GFR calc Af Amer: >60 mL/min (ref >60)
GFR calc non Af Amer: 56 mL/min — ABNORMAL LOW (ref >60)
Glucose, Bld: 174 mg/dL — ABNORMAL HIGH (ref 70–99)
Potassium: 3.7 mmol/L (ref 3.5–5.1)
Sodium: 143 mmol/L (ref 135–145)
Total Bilirubin: 0.3 mg/dL (ref 0.3–1.2)
Total Protein: 6.4 g/dL — ABNORMAL LOW (ref 6.5–8.1)

## 2019-05-08 LAB — TROPONIN I (HIGH SENSITIVITY): Troponin I (High Sensitivity): 5 ng/L (ref <18)

## 2019-05-08 LAB — POC SARS CORONAVIRUS 2 AG -  ED: SARS Coronavirus 2 Ag: NEGATIVE

## 2019-05-08 MED ORDER — DEXAMETHASONE 4 MG PO TABS
6.0000 mg | ORAL_TABLET | Freq: Once | ORAL | Status: AC
  Administered 2019-05-08: 19:00:00 6 mg via ORAL
  Filled 2019-05-08: qty 2

## 2019-05-08 MED ORDER — ALBUTEROL SULFATE HFA 108 (90 BASE) MCG/ACT IN AERS
4.0000 | INHALATION_SPRAY | Freq: Once | RESPIRATORY_TRACT | Status: AC
  Administered 2019-05-08: 4 via RESPIRATORY_TRACT
  Filled 2019-05-08: qty 6.7

## 2019-05-08 MED ORDER — AZITHROMYCIN 250 MG PO TABS: ORAL_TABLET | ORAL | 0 refills | Status: DC

## 2019-05-08 MED ORDER — PREDNISONE 20 MG PO TABS: 20.0000 mg | ORAL_TABLET | Freq: Two times a day (BID) | ORAL | 0 refills | Status: AC

## 2019-05-08 NOTE — ED Notes (Addendum)
Pt ambulated around room. Oxygen saturations maintained between 91%-94% on room air. Patient still has cough with ambulating

## 2019-05-08 NOTE — ED Provider Notes (Signed)
[facility] EM[facility] DEPART[facility] Provider Note   CSN: [id number] Arrival date & time: 05/08/19  1619     History No chief complaint on file.   Nancy Merritt is a 78 y.o. female.  HPI Nancy Merritt is a 78 y.o. female with a medical history of cabg, asthma who presents to the ED for non productive cough and shortness of breath that worsened today despite home breathing treatments. She also reports chest pain for two hours just prior to arrival, left sided, sharp, no modifying factors, left sided. No sick contacts. She reports her symptoms feel like a mild asthma exacerbation similar to previous episodes. She reports mild dyspnea with ambulation.      Past Medical History:  Diagnosis Date  . Arthritis   . Asthma   . Cancer ([place]) 1990  . Depression     Patient Active Problem List   Diagnosis Date Noted  . Advance directive discussed with patient 11/09/2018  . Hypercalcemia 04/13/2018  . Mild persistent asthma 04/07/2018  . Hypertension 04/07/2018  . Depression, recurrent ([place]) 04/07/2018  . Hx of breast cancer 04/07/2018  . Skin lesion of back 04/07/2018  . CAD (coronary artery disease) 04/07/2018    Past Surgical History:  Procedure Laterality Date  . BREAST SURGERY Left 1990  . CORONARY ARTERY BYPASS GRAFT    . JOINT REPLACEMENT Right    Hip  . OVARIAN CYST REMOVAL  1970  . TUBAL LIGATION  1970     OB History   No obstetric history on file.     Family History  Problem Relation Age of Onset  . Cancer Father        lung  . Heart attack Father 26  . Alcohol abuse Father   . Arthritis Father   . COPD Father   . Diabetes Father   . Hypertension Father   . Breast cancer Sister 37  . Arthritis Sister   . Cancer Sister   . COPD Sister   . Drug abuse Sister   . Hypertension Sister   . Miscarriages / Stillbirths Sister   . Thyroid disease Sister   . Arthritis Mother   . COPD Mother   . Diabetes Mother   . Hearing loss Mother     . Hypertension Mother   . Miscarriages / [place] Mother   . Stroke Mother   . Alcohol abuse Brother   . Arthritis Brother   . Hypertension Brother   . Arthritis Daughter   . Asthma Daughter   . Depression Daughter   . Hypertension Daughter   . Miscarriages / [place] Daughter   . Thyroid disease Daughter   . Arthritis Maternal Grandmother   . Hypertension Maternal Grandmother   . Stroke Maternal Grandmother   . Thyroid disease Maternal Grandmother   . Alcohol abuse Maternal Grandfather   . Arthritis Maternal Grandfather   . Cancer Maternal Grandfather   . Depression Maternal Grandfather   . Hearing loss Maternal Grandfather   . Hypertension Maternal Grandfather   . Arthritis Sister   . Thyroid disease Sister   . Thyroid disease Daughter   . Hypertension Daughter   . Depression Daughter   . COPD Daughter   . Alcohol abuse Daughter   . Arthritis Daughter   . Arthritis Daughter   . COPD Daughter   . Depression Daughter   . Hearing loss Daughter   . Hypertension Daughter   . Thyroid disease Daughter     Social History  Tobacco Use  . Smoking status: Former Smoker    Packs/Sheilyn Boehlke: 1.00    Years: 40.00    Pack years: 40.00    Types: Cigarettes    Quit date: 2002    Years since quitting: 18.9  . Smokeless tobacco: Never Used  Substance Use Topics  . Alcohol use: Not Currently  . Drug use: Never    Home Medications Prior to Admission medications   Medication Sig Start Date End Date Taking? Authorizing Provider  albuterol (PROVENTIL HFA;VENTOLIN HFA) 108 (90 Base) MCG/ACT inhaler Inhale 2 puffs into the lungs every 4 (four) hours as needed for wheezing or shortness of breath. 04/07/18   Lesleigh Noe, MD  albuterol (PROVENTIL) (2.5 MG/3ML) 0.083% nebulizer solution Take 3 mLs (2.5 mg total) by nebulization 3 (three) times daily as needed. 04/07/18   Lesleigh Noe, MD  Azelastine HCl 0.15 % SOLN Place 1 spray into the nose daily as needed. 04/07/18   Lesleigh Noe, MD  azithromycin (ZITHROMAX) 250 MG tablet Take 2 tablets on 12/20 From 12/21-12/24 take one tablet once a Jaidyn Usery 05/08/19   Niasia Lanphear, Lovena Le, MD  Flaxseed, Linseed, (FLAX SEEDS PO) Take 1 tablet by mouth daily.    [provider]  fluticasone (FLOVENT HFA) 44 MCG/ACT inhaler Inhale 2 puffs into the lungs 2 (two) times daily as needed. 05/27/18   Margaretha Seeds, MD  fluticasone furoate-vilanterol (BREO ELLIPTA) 200-25 MCG/INH AEPB Inhale 1 puff into the lungs daily. 12/30/18   Lesleigh Noe, MD  hydrochlorothiazide (HYDRODIURIL) 12.5 MG tablet Take 1 tablet (12.5 mg total) by mouth daily. 11/09/18   Lesleigh Noe, MD  ibuprofen (ADVIL,MOTRIN) 200 MG tablet Take 200 mg by mouth every 6 (six) hours as needed.    [provider]  lisinopril (ZESTRIL) 20 MG tablet Take 1 tablet (20 mg total) by mouth daily. 11/09/18   Lesleigh Noe, MD  loratadine (CLARITIN) 10 MG tablet Take 10 mg by mouth daily.    [provider]  Multiple Vitamins-Minerals (CENTRUM SILVER PO) Take 1 tablet by mouth daily.    [provider]  predniSONE (DELTASONE) 20 MG tablet Take 1 tablet (20 mg total) by mouth 2 (two) times daily with a meal for 5 days. 05/08/19 05/13/19  Anisha Starliper, Lovena Le, MD  Tiotropium Bromide Monohydrate (SPIRIVA RESPIMAT) 2.5 MCG/ACT AERS Inhale 2 puffs into the lungs daily. 04/07/18   Lesleigh Noe, MD    Allergies    Advair diskus [fluticasone-salmeterol], Levofloxacin, Qvar [beclomethasone], Singulair [montelukast], and Symbicort [budesonide-formoterol fumarate]  Review of Systems   Review of Systems  Constitutional: Positive for fatigue. Negative for chills and fever.  HENT: Negative for ear pain and sore throat.   Eyes: Negative for pain and visual disturbance.  Respiratory: Positive for cough and shortness of breath.   Cardiovascular: Negative for chest pain and palpitations.  Gastrointestinal: Negative for abdominal pain and vomiting.  Genitourinary:  Negative for dysuria and hematuria.  Musculoskeletal: Negative for arthralgias and back pain.  Skin: Negative for color change and rash.  Neurological: Negative for seizures and syncope.  All other systems reviewed and are negative.   Physical Exam Updated Vital Signs BP (!) 143/86 (BP Location: Right Arm)   Pulse 86   Temp 98.1 F (36.7 C) (Oral)   Resp 20   Ht 5' (1.524 m)   Wt 81.6 kg   SpO2 95%   BMI 35.15 kg/m   Physical Exam Vitals and nursing note reviewed.  Constitutional:  General: She is in acute distress.     Appearance: Normal appearance. She is well-developed. She is obese. She is not ill-appearing.     Comments: Mild acute distress  HENT:     Head: Normocephalic and atraumatic.     Right Ear: External ear normal.     Left Ear: External ear normal.     Nose: Nose normal. No rhinorrhea.     Mouth/Throat:     Mouth: Mucous membranes are moist.  Eyes:     General:        Right eye: No discharge.        Left eye: No discharge.     Conjunctiva/sclera: Conjunctivae normal.  Cardiovascular:     Rate and Rhythm: Normal rate and regular rhythm.     Pulses: Normal pulses.     Heart sounds: Normal heart sounds. No murmur.  Pulmonary:     Effort: Pulmonary effort is normal. No respiratory distress.     Breath sounds: Wheezing and rhonchi present. No rales.  Abdominal:     General: Abdomen is flat. There is no distension.     Palpations: Abdomen is soft.     Tenderness: There is no abdominal tenderness.  Musculoskeletal:        General: No deformity or signs of injury. Normal range of motion.     Cervical back: Normal range of motion and neck supple.  Skin:    General: Skin is warm and dry.     Capillary Refill: Capillary refill takes less than 2 seconds.     Coloration: Skin is not jaundiced.  Neurological:     General: No focal deficit present.     Mental Status: She is alert. Mental status is at baseline.  Psychiatric:        Mood and Affect: Mood  normal.        Behavior: Behavior normal.     ED Results / Procedures / Treatments   Labs (all labs ordered are listed, but only abnormal results are displayed) Labs Reviewed  COMPREHENSIVE METABOLIC PANEL - Abnormal; Notable for the following components:      Result Value   Glucose, Bld 174 (*)    Calcium 10.7 (*)    Total Protein 6.4 (*)    GFR calc non Af Amer 56 (*)    All other components within normal limits  CBC WITH DIFFERENTIAL/PLATELET - Abnormal; Notable for the following components:   WBC 11.8 (*)    Platelets 544 (*)    Neutro Abs 8.8 (*)    All other components within normal limits  POCT I-STAT EG7 - Abnormal; Notable for the following components:   pCO2, Ven 38.4 (*)    pO2, Ven 62.0 (*)    All other components within normal limits  BRAIN NATRIURETIC PEPTIDE  POC SARS CORONAVIRUS 2 AG -  ED  I-STAT VENOUS BLOOD GAS, ED  TROPONIN I (HIGH SENSITIVITY)  TROPONIN I (HIGH SENSITIVITY)    EKG EKG Interpretation  Date/Time:  Saturday May 08 2019 17:24:42 EST Ventricular Rate:  93 PR Interval:  162 QRS Duration: 86 QT Interval:  354 QTC Calculation: 440 R Axis:   -19 Text Interpretation: Sinus rhythm with occasional Premature ventricular complexes Septal infarct , age undetermined Abnormal ECG Confirmed by Virgel Manifold [phone]) on 05/08/2019 8:58:30 PM   Radiology DG Chest Port 1 View  Result Date: 05/08/2019 CLINICAL DATA:  Shortness of breath and chest pain. EXAM: PORTABLE CHEST 1 VIEW COMPARISON:  None. FINDINGS: The  cardiomediastinal silhouette is unremarkable. CABG changes and surgical clips in the LEFT axilla are noted. Mild bibasilar opacities are present No pleural effusion, pneumothorax or acute bony abnormality. IMPRESSION: Mild bibasilar opacities of uncertain chronicity. Question atelectasis, scarring or possibly airspace disease. Electronically Signed   By: Margarette Canada M.D.   On: 05/08/2019 17:56    Procedures Procedures (including  critical care time)  Medications Ordered in ED Medications  albuterol (VENTOLIN HFA) 108 (90 Base) MCG/ACT inhaler 4 puff (4 puffs Inhalation Given 05/08/19 1900)  dexamethasone (DECADRON) tablet 6 mg (6 mg Oral Given 05/08/19 1858)    ED Course  I have reviewed the triage vital signs and the nursing notes.  Pertinent labs & imaging results that were available during my care of the patient were reviewed by me and considered in my medical decision making (see chart for details).    MDM Rules/Calculators/A&P                      Ridhima Demerath is a 78 y.o. female with a medical history of cabg, asthma who presents to the ED for non productive cough and shortness of breath that worsened today despite home breathing treatments. She also reports chest pain for two hours just prior to arrival, left sided, sharp, no modifying factors, left sided. No sick contacts. She reports her symptoms feel like a mild asthma exacerbation similar to previous episodes. She reports mild dyspnea with ambulation.  HPI and physical exam as above. She presents awake, alert, hemodynamically stable, afebrile, non toxic. No desaturation with ambulation on room air. She is mildly dyspneic on exertion. Cxr is equivocal with mild bibasilar opacities. Low suspicion for PE (no hypoxia or tachycardia, wells score 0). Her symptoms are likely due to copd exacerbation, will treat her with course of antibiotics and steroids. She is well appearing and ready for discharge. Discussed plan with patient and family including strict return precautions and follow up with PCP. Patient and family understand and are amenable with plan.    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

## 2019-05-08 NOTE — ED Notes (Signed)
Patient verbalizes understanding of discharge instructions. Opportunity for questioning and answers were provided. Armband removed by staff, pt discharged from ED with family.  

## 2019-05-08 NOTE — ED Triage Notes (Signed)
Patient arrives via POV due to having increased shortness and of breath and cough x2 days and some chest pain that has resolved. No known covid exposures.   Reports no chest pain at this time.

## 2019-05-08 NOTE — ED Notes (Signed)
Pt daughter Ricard Dillon [phone]

## 2019-05-08 NOTE — ED Notes (Signed)
Ricard Dillon [phone] daughter looking for an update on the patient

## 2019-05-17 ENCOUNTER — Telehealth: Payer: Self-pay

## 2019-05-17 NOTE — Telephone Encounter (Signed)
Ok for in-person visit as long as she has not developed new symptoms (fever, diarrhea, loss of taste/smell) which would want to consider retesting for covid.

## 2019-05-17 NOTE — Telephone Encounter (Signed)
Patient advised and verbalized understanding 

## 2019-05-17 NOTE — Telephone Encounter (Signed)
Patient was seen in the ED on 05/08/19 for COPD exacerbation. Patient states she is still having issues with feeling short of breath, and does not feel like her symptoms are improving. Patient was tested for COVID while in the ED and it was negative. Dr. Einar Pheasant, is it okay to bring this patient in the office for an ED follow up, or will it need to be virtual? Thanks!

## 2019-05-19 ENCOUNTER — Ambulatory Visit (INDEPENDENT_AMBULATORY_CARE_PROVIDER_SITE_OTHER): Payer: PPO | Admitting: Family Medicine

## 2019-05-19 ENCOUNTER — Other Ambulatory Visit: Payer: Self-pay

## 2019-05-19 ENCOUNTER — Encounter: Payer: Self-pay | Admitting: Family Medicine

## 2019-05-19 VITALS — BP 132/88 | HR 90 | Temp 97.8°F | Resp 24 | Ht 60.5 in | Wt 184.2 lb

## 2019-05-19 DIAGNOSIS — Z23 Encounter for immunization: Secondary | ICD-10-CM | POA: Diagnosis not present

## 2019-05-19 DIAGNOSIS — J4531 Mild persistent asthma with (acute) exacerbation: Secondary | ICD-10-CM

## 2019-05-19 MED ORDER — PREDNISONE 20 MG PO TABS: ORAL_TABLET | ORAL | 0 refills | Status: AC

## 2019-05-19 MED ORDER — FLOVENT HFA 44 MCG/ACT IN AERO: 2.0000 | INHALATION_SPRAY | Freq: Two times a day (BID) | RESPIRATORY_TRACT | 5 refills | Status: DC | PRN

## 2019-05-19 NOTE — Progress Notes (Signed)
Subjective:     Nancy Merritt is a 78 y.o. female presenting for ER follow up     HPI  #asthma exacerbation - has not been feeling well for 4-5 weeks - went to the ER 12/19 - treated with prednisone and azithromycin - cannot do normal activities - worse breathing with moving around - things got better on the medication  - does not feel she has gotten back to normal - not worse with laying flat - sitting or laying down is doing well - using rescue inhaler 1 time a day and nebulizer 3 times a day  - did not get back to baseline on the steroid and noticed improvement towards the end of the course  Review of Systems  Constitutional: Positive for fatigue. Negative for chills and fever.  Respiratory: Positive for cough, shortness of breath and wheezing.     05/08/2019: ER - COPD exacerbation - prednisone and azithromycin. Normal pulse ox, neg covid  Social History   Tobacco Use  Smoking Status Former Smoker  . Packs/day: 1.00  . Years: 40.00  . Pack years: 40.00  . Types: Cigarettes  . Quit date: 2002  . Years since quitting: 19.0  Smokeless Tobacco Never Used        Objective:    BP Readings from Last 3 Encounters:  05/19/19 132/88  05/08/19 (!) 143/86  11/09/18 (!) 148/80   Wt Readings from Last 3 Encounters:  05/19/19 184 lb 4 oz (83.6 kg)  05/08/19 180 lb (81.6 kg)  11/09/18 179 lb 4 oz (81.3 kg)    BP 132/88   Pulse 90   Temp 97.8 F (36.6 C)   Resp (!) 24   Ht 5' 0.5" (1.537 m)   Wt 184 lb 4 oz (83.6 kg)   SpO2 98%   BMI 35.39 kg/m    Physical Exam Constitutional:      General: She is not in acute distress.    Appearance: She is well-developed. She is not diaphoretic.  HENT:     Right Ear: External ear normal.     Left Ear: External ear normal.     Nose:     Comments: Wearing mask Eyes:     Conjunctiva/sclera: Conjunctivae normal.  Cardiovascular:     Rate and Rhythm: Normal rate and regular rhythm.     Heart sounds: No murmur.    Pulmonary:     Effort: Pulmonary effort is normal. Tachypnea present. No accessory muscle usage or respiratory distress.     Breath sounds: Wheezing (diffuse) present.  Musculoskeletal:     Cervical back: Neck supple.  Skin:    General: Skin is warm and dry.     Capillary Refill: Capillary refill takes less than 2 seconds.  Neurological:     Mental Status: She is alert. Mental status is at baseline.  Psychiatric:        Mood and Affect: Mood normal.        Behavior: Behavior normal.           Assessment & Plan:   Problem List Items Addressed This Visit      Respiratory   Mild persistent asthma - Primary    Reviewed last pulm note from 05/2018. Pt never started the flovent inhaler. This may be helpful for her. Sent to pharmacy. Will also do longer steroid course - as pt noted some improvement towards the end of previous steroid burst. Advised follow-up with pulmonology if no improvement in 2 weeks  Relevant Medications   fluticasone (FLOVENT HFA) 44 MCG/ACT inhaler   predniSONE (DELTASONE) 20 MG tablet       Return if symptoms worsen or fail to improve.  Lesleigh Noe, MD

## 2019-05-19 NOTE — Patient Instructions (Addendum)
Asthma - start taking Flovent (new inhaler) twice a day for the next 3 days.  - then take as needed for wheezing and shortness of breath  - Prednisone taper  Call to schedule appointment with Chi Rodman Pickle, MD [facility] Phone: [phone]  If not better see the pulmonary doctor

## 2019-05-19 NOTE — Assessment & Plan Note (Addendum)
Reviewed last pulm note from 05/2018. Pt never started the flovent inhaler. This may be helpful for her. Sent to pharmacy. Will also do longer steroid course - as pt noted some improvement towards the end of previous steroid burst. Advised follow-up with pulmonology if no improvement in 2 weeks

## 2019-05-29 ENCOUNTER — Other Ambulatory Visit: Payer: Self-pay | Admitting: Family Medicine

## 2019-05-29 DIAGNOSIS — J4530 Mild persistent asthma, uncomplicated: Secondary | ICD-10-CM

## 2019-06-10 DIAGNOSIS — M542 Cervicalgia: Secondary | ICD-10-CM | POA: Diagnosis not present

## 2019-06-10 DIAGNOSIS — M9902 Segmental and somatic dysfunction of thoracic region: Secondary | ICD-10-CM | POA: Diagnosis not present

## 2019-06-10 DIAGNOSIS — M62838 Other muscle spasm: Secondary | ICD-10-CM | POA: Diagnosis not present

## 2019-06-10 DIAGNOSIS — M9903 Segmental and somatic dysfunction of lumbar region: Secondary | ICD-10-CM | POA: Diagnosis not present

## 2019-06-10 DIAGNOSIS — M9901 Segmental and somatic dysfunction of cervical region: Secondary | ICD-10-CM | POA: Diagnosis not present

## 2019-06-10 DIAGNOSIS — M62830 Muscle spasm of back: Secondary | ICD-10-CM | POA: Diagnosis not present

## 2019-06-10 DIAGNOSIS — M545 Low back pain: Secondary | ICD-10-CM | POA: Diagnosis not present

## 2019-06-10 DIAGNOSIS — M546 Pain in thoracic spine: Secondary | ICD-10-CM | POA: Diagnosis not present

## 2019-06-17 DIAGNOSIS — M9902 Segmental and somatic dysfunction of thoracic region: Secondary | ICD-10-CM | POA: Diagnosis not present

## 2019-06-17 DIAGNOSIS — M9903 Segmental and somatic dysfunction of lumbar region: Secondary | ICD-10-CM | POA: Diagnosis not present

## 2019-06-17 DIAGNOSIS — M542 Cervicalgia: Secondary | ICD-10-CM | POA: Diagnosis not present

## 2019-06-17 DIAGNOSIS — M62830 Muscle spasm of back: Secondary | ICD-10-CM | POA: Diagnosis not present

## 2019-06-17 DIAGNOSIS — M9901 Segmental and somatic dysfunction of cervical region: Secondary | ICD-10-CM | POA: Diagnosis not present

## 2019-06-17 DIAGNOSIS — M62838 Other muscle spasm: Secondary | ICD-10-CM | POA: Diagnosis not present

## 2019-06-17 DIAGNOSIS — M546 Pain in thoracic spine: Secondary | ICD-10-CM | POA: Diagnosis not present

## 2019-06-17 DIAGNOSIS — M545 Low back pain: Secondary | ICD-10-CM | POA: Diagnosis not present

## 2019-06-21 DIAGNOSIS — M546 Pain in thoracic spine: Secondary | ICD-10-CM | POA: Diagnosis not present

## 2019-06-21 DIAGNOSIS — M542 Cervicalgia: Secondary | ICD-10-CM | POA: Diagnosis not present

## 2019-06-21 DIAGNOSIS — M62830 Muscle spasm of back: Secondary | ICD-10-CM | POA: Diagnosis not present

## 2019-06-21 DIAGNOSIS — M9901 Segmental and somatic dysfunction of cervical region: Secondary | ICD-10-CM | POA: Diagnosis not present

## 2019-06-21 DIAGNOSIS — M62838 Other muscle spasm: Secondary | ICD-10-CM | POA: Diagnosis not present

## 2019-06-21 DIAGNOSIS — M9902 Segmental and somatic dysfunction of thoracic region: Secondary | ICD-10-CM | POA: Diagnosis not present

## 2019-06-21 DIAGNOSIS — M9903 Segmental and somatic dysfunction of lumbar region: Secondary | ICD-10-CM | POA: Diagnosis not present

## 2019-06-21 DIAGNOSIS — M545 Low back pain: Secondary | ICD-10-CM | POA: Diagnosis not present

## 2019-06-22 DIAGNOSIS — M546 Pain in thoracic spine: Secondary | ICD-10-CM | POA: Diagnosis not present

## 2019-06-22 DIAGNOSIS — M542 Cervicalgia: Secondary | ICD-10-CM | POA: Diagnosis not present

## 2019-06-22 DIAGNOSIS — M9902 Segmental and somatic dysfunction of thoracic region: Secondary | ICD-10-CM | POA: Diagnosis not present

## 2019-06-22 DIAGNOSIS — M9901 Segmental and somatic dysfunction of cervical region: Secondary | ICD-10-CM | POA: Diagnosis not present

## 2019-06-22 DIAGNOSIS — M62830 Muscle spasm of back: Secondary | ICD-10-CM | POA: Diagnosis not present

## 2019-06-22 DIAGNOSIS — M9903 Segmental and somatic dysfunction of lumbar region: Secondary | ICD-10-CM | POA: Diagnosis not present

## 2019-06-22 DIAGNOSIS — M62838 Other muscle spasm: Secondary | ICD-10-CM | POA: Diagnosis not present

## 2019-06-22 DIAGNOSIS — M545 Low back pain: Secondary | ICD-10-CM | POA: Diagnosis not present

## 2019-06-28 DIAGNOSIS — M62838 Other muscle spasm: Secondary | ICD-10-CM | POA: Diagnosis not present

## 2019-06-28 DIAGNOSIS — M545 Low back pain: Secondary | ICD-10-CM | POA: Diagnosis not present

## 2019-06-28 DIAGNOSIS — M9901 Segmental and somatic dysfunction of cervical region: Secondary | ICD-10-CM | POA: Diagnosis not present

## 2019-06-28 DIAGNOSIS — M546 Pain in thoracic spine: Secondary | ICD-10-CM | POA: Diagnosis not present

## 2019-06-28 DIAGNOSIS — M9903 Segmental and somatic dysfunction of lumbar region: Secondary | ICD-10-CM | POA: Diagnosis not present

## 2019-06-28 DIAGNOSIS — M62830 Muscle spasm of back: Secondary | ICD-10-CM | POA: Diagnosis not present

## 2019-06-28 DIAGNOSIS — M542 Cervicalgia: Secondary | ICD-10-CM | POA: Diagnosis not present

## 2019-06-28 DIAGNOSIS — M9902 Segmental and somatic dysfunction of thoracic region: Secondary | ICD-10-CM | POA: Diagnosis not present

## 2019-06-29 DIAGNOSIS — M545 Low back pain: Secondary | ICD-10-CM | POA: Diagnosis not present

## 2019-06-29 DIAGNOSIS — M62838 Other muscle spasm: Secondary | ICD-10-CM | POA: Diagnosis not present

## 2019-06-29 DIAGNOSIS — M9903 Segmental and somatic dysfunction of lumbar region: Secondary | ICD-10-CM | POA: Diagnosis not present

## 2019-06-29 DIAGNOSIS — M9902 Segmental and somatic dysfunction of thoracic region: Secondary | ICD-10-CM | POA: Diagnosis not present

## 2019-06-29 DIAGNOSIS — M9901 Segmental and somatic dysfunction of cervical region: Secondary | ICD-10-CM | POA: Diagnosis not present

## 2019-06-29 DIAGNOSIS — M542 Cervicalgia: Secondary | ICD-10-CM | POA: Diagnosis not present

## 2019-06-29 DIAGNOSIS — M546 Pain in thoracic spine: Secondary | ICD-10-CM | POA: Diagnosis not present

## 2019-06-29 DIAGNOSIS — M62830 Muscle spasm of back: Secondary | ICD-10-CM | POA: Diagnosis not present

## 2019-08-19 ENCOUNTER — Ambulatory Visit (INDEPENDENT_AMBULATORY_CARE_PROVIDER_SITE_OTHER): Payer: PPO | Admitting: Family Medicine

## 2019-08-19 ENCOUNTER — Encounter: Payer: Self-pay | Admitting: Family Medicine

## 2019-08-19 ENCOUNTER — Telehealth: Payer: Self-pay | Admitting: Family Medicine

## 2019-08-19 VITALS — HR 86

## 2019-08-19 DIAGNOSIS — J4530 Mild persistent asthma, uncomplicated: Secondary | ICD-10-CM

## 2019-08-19 DIAGNOSIS — J441 Chronic obstructive pulmonary disease with (acute) exacerbation: Secondary | ICD-10-CM

## 2019-08-19 DIAGNOSIS — J449 Chronic obstructive pulmonary disease, unspecified: Secondary | ICD-10-CM | POA: Insufficient documentation

## 2019-08-19 MED ORDER — AZITHROMYCIN 250 MG PO TABS: ORAL_TABLET | ORAL | 0 refills | Status: DC

## 2019-08-19 MED ORDER — PREDNISONE 20 MG PO TABS: ORAL_TABLET | ORAL | 0 refills | Status: AC

## 2019-08-19 MED ORDER — ALBUTEROL SULFATE (2.5 MG/3ML) 0.083% IN NEBU: 2.5000 mg | INHALATION_SOLUTION | Freq: Three times a day (TID) | RESPIRATORY_TRACT | 2 refills | Status: DC | PRN

## 2019-08-19 NOTE — Progress Notes (Signed)
I connected with Nancy Merritt on 08/19/19 at  4:00 PM EDT by video and verified that I am speaking with the correct person using two identifiers.   I discussed the limitations, risks, security and privacy concerns of performing an evaluation and management service by video and the availability of in person appointments. I also discussed with the patient that there may be a patient responsible charge related to this service. The patient expressed understanding and agreed to proceed.  Patient location: Home Provider Location: [place] Participants: Lesleigh Noe and Nancy Merritt   Subjective:     Nancy Merritt is a 79 y.o. female presenting for Shortness of Breath, Cough, Fatigue, and Nasal Congestion     Cough This is a new problem. The current episode started 1 to 4 weeks ago. The cough is productive of sputum. Associated symptoms include ear pain, myalgias, nasal congestion, rhinorrhea and shortness of breath. Pertinent negatives include no chills, fever, headaches or sore throat.   No loss of taste or smell Did get tested for covid in December  No known exposure - no one else is sick at home  Has been doing nebulizer 3 times in additional her regular asthma medications, will also occasionally need her rescue inhaler  Thinks this is an asthma e  Review of Systems  Constitutional: Positive for fatigue. Negative for chills and fever.  HENT: Positive for congestion, ear pain and rhinorrhea. Negative for sinus pressure, sinus pain and sore throat.   Eyes: Negative for itching.  Respiratory: Positive for cough and shortness of breath.   Gastrointestinal: Negative for abdominal pain, diarrhea, nausea and vomiting.       Occasional gas pains  Musculoskeletal: Positive for myalgias.  Neurological: Negative for headaches.     Social History   Tobacco Use  Smoking Status Former Smoker  . Packs/day: 1.00  . Years: 40.00  . Pack years: 40.00  . Types:  Cigarettes  . Quit date: 2002  . Years since quitting: 19.2  Smokeless Tobacco Never Used        Objective:   BP Readings from Last 3 Encounters:  05/19/19 132/88  05/08/19 (!) 143/86  11/09/18 (!) 148/80   Wt Readings from Last 3 Encounters:  05/19/19 184 lb 4 oz (83.6 kg)  05/08/19 180 lb (81.6 kg)  11/09/18 179 lb 4 oz (81.3 kg)   Pulse 86   SpO2 90%    Physical Exam Constitutional:      General: She is not in acute distress.    Appearance: Normal appearance. She is not ill-appearing.  HENT:     Head: Normocephalic and atraumatic.     Right Ear: External ear normal.     Left Ear: External ear normal.  Eyes:     Conjunctiva/sclera: Conjunctivae normal.  Pulmonary:     Effort: Pulmonary effort is normal. No respiratory distress.  Neurological:     Mental Status: She is alert. Mental status is at baseline.  Psychiatric:        Mood and Affect: Mood normal.        Behavior: Behavior normal.        Thought Content: Thought content normal.        Judgment: Judgment normal.             Assessment & Plan:   Problem List Items Addressed This Visit      Respiratory   Mild persistent asthma   Relevant Medications   azithromycin (ZITHROMAX) 250 MG  tablet   albuterol (PROVENTIL) (2.5 MG/3ML) 0.083% nebulizer solution   predniSONE (DELTASONE) 20 MG tablet   COPD exacerbation (HCC) - Primary    Second exacerbation in a 6 month period. Previous one did not resolve with 5 days of steroids, so will give longer course with this episode. Discuss need for pulmonology follow-up and will provide number for them to call in AVS. Pt desired avoiding abx, will start with steroids and if no improvement in 2 days will start Abx. ER precautions if worsening      Relevant Medications   cetirizine (ZYRTEC) 10 MG tablet   azithromycin (ZITHROMAX) 250 MG tablet   albuterol (PROVENTIL) (2.5 MG/3ML) 0.083% nebulizer solution   predniSONE (DELTASONE) 20 MG tablet       No  follow-ups on file.  Lesleigh Noe, MD

## 2019-08-19 NOTE — Patient Instructions (Signed)
Call [place] [facility], [place]: [phone] Fax: [phone]  To get follow-up appointment

## 2019-08-19 NOTE — Telephone Encounter (Signed)
[facility] - Client TELEPHONE ADVICE RECORD [facility] Patient Name: Nancy Merritt Gender: Female DOB: Sep 09, 1940 Age: 79 Y 93 M 13 D Return Phone Number: [phone] (Primary) Address[place] [address] Client [facility] - Client Client Site [place] - Day Physician Waunita Schooner- MD Contact Type Call Who Is Calling Patient / Member / Family / Caregiver Call Type Triage / Clinical Relationship To Patient Self Return Phone Number [phone] (Primary) Chief Complaint BREATHING - shortness of breath or sounds breathless Reason for Call Symptomatic / Request for [name] states she has for about a week she is having shortness of breath and now it is getting worse to where when she is gets up she is having to gasp for air. Translation No Nurse Assessment Nurse: Sherrell Puller, RN, Amy Date/Time Eilene Ghazi Time): 08/19/2019 9:28:19 AM Confirm and document reason for call. If symptomatic, describe symptoms. ---Caller states she is short of breath, cough and fatigue. Has had shortness of breath for a week that has gradually become worse. No fever. Says she feels some tightness in her chest, pulse ox is 90% and usually runs about 97% Has the patient had close contact with a person known or suspected to have the novel coronavirus illness OR traveled / lives in area with major community spread (including international travel) in the last 14 days from the onset of symptoms? * If Asymptomatic, screen for exposure and travel within the last 14 days. ---No Does the patient have any new or worsening symptoms? ---Yes Will a triage be completed? ---Yes Related visit to physician within the last 2 weeks? ---No Does the PT have any chronic conditions? (i.e. diabetes, asthma, this includes High risk factors for pregnancy, etc.) ---Yes List chronic conditions.  ---Asthma Is this a behavioral health or substance abuse call? ---No Guidelines Guideline Title Affirmed Question Affirmed Notes Nurse Date/Time ([place] Time) COVID-19 - Diagnosed or Suspected SEVERE or constant chest pain or pressure (Exception: mild central Ramsey, RN, Amy 08/19/2019 9:29:46 AM PLEASE NOTE: All timestamps contained within this report are represented as [place] Standard Time. CONFIDENTIALTY NOTICE: This fax transmission is intended only for the addressee. It contains information that is legally privileged, confidential or otherwise protected from use or disclosure. If you are not the intended recipient, you are strictly prohibited from reviewing, disclosing, copying using or disseminating any of this information or taking any action in reliance on or regarding this information. If you have received this fax in error, please notify us immediately by telephone so that we can arrange for its return to [place]. Phone: [phone], Toll-Free: [phone], Fax: [phone] Page: 2 of 2 Call Id: [id number] Guidelines Guideline Title Affirmed Question Affirmed Notes Nurse Date/Time Eilene Ghazi Time) chest pain, present only when coughing) Disp. Time Eilene Ghazi Time) Disposition Final User 08/19/2019 9:25:32 AM Send to Urgent Queue Rulon Eisenmenger 08/19/2019 9:36:35 AM Go to ED Now Yes Sherrell Puller, RN, Amy Caller Disagree/Comply Disagree Caller Understands Yes PreDisposition Did not know what to do Care Advice Given Per Guideline GO TO ED NOW: * You need to be seen in the Emergency Department. * Go to the ED at ___________ [place] now. Drive carefully. WEAR A MASK - COVER YOUR MOUTH AND NOSE: * Wear a mask. ANOTHER ADULT SHOULD DRIVE: * It is better and safer if another adult drives instead of you. CALL EMS 911 IF: * Severe difficulty breathing occurs * Lips or face turns blue *  Confusion occurs. CARE ADVICE given per COVID-19 - DIAGNOSED OR SUSPECTED (Adult) guideline.  Patient was advised to go to ER but she says she doesn't want to because she doesn't think her symptoms are that serious and she wants to be seen in the office, this nurse advised ER would be best but patient wants appointment. Called backline and provided information to the office and that patient is refusing an ER visit. Warm transferred patient over per office. Referrals GO TO FACILITY REFUSED

## 2019-08-19 NOTE — Telephone Encounter (Signed)
Patient called in today stating she was having SOB upon exertion, a cough and fatigue.PAtient stated that this has been going on for 1 week and progressing. I sent patient over to the Access Nurse to be Triaged. Access nurse called back , stating she advised that patient should go to the ED for further evaluation of her symptoms but patient declined, she was refusing to go to an ED. Patient was transferred to me where I advised that with these symptoms we are unable to bring her in but would be happy to schedule a virtual visit with her provider. Patient wanted me to speak with her daughter to get information about virtual and the reason an in office visit could not happen. Patient daughter voiced understanding about information given  Virtual visit was scheduled with Dr Einar Pheasant for this afternoon

## 2019-08-19 NOTE — Telephone Encounter (Signed)
Please see note from encounter

## 2019-08-19 NOTE — Assessment & Plan Note (Addendum)
Second exacerbation in a 6 month period. Previous one did not resolve with 5 days of steroids, so will give longer course with this episode. Discuss need for pulmonology follow-up and will provide number for them to call in AVS. Pt desired avoiding abx, will start with steroids and if no improvement in 2 days will start Abx. ER precautions if worsening

## 2019-08-27 ENCOUNTER — Telehealth: Payer: Self-pay

## 2019-08-27 DIAGNOSIS — I10 Essential (primary) hypertension: Secondary | ICD-10-CM

## 2019-08-27 MED ORDER — LISINOPRIL 20 MG PO TABS: 20.0000 mg | ORAL_TABLET | Freq: Two times a day (BID) | ORAL | 2 refills | Status: DC

## 2019-08-27 NOTE — Telephone Encounter (Signed)
Please schedule patient for AWV with the nurse and then CPE with labs same day with Dr Einar Pheasant after 11/09/2019, when possible. Thank you

## 2019-08-30 NOTE — Telephone Encounter (Signed)
Refill was already provided last week.

## 2019-08-30 NOTE — Telephone Encounter (Signed)
Patient came in office stating she needs a refill for her lisinopril prescription

## 2019-09-12 ENCOUNTER — Other Ambulatory Visit: Payer: Self-pay | Admitting: Family Medicine

## 2019-09-12 DIAGNOSIS — J4530 Mild persistent asthma, uncomplicated: Secondary | ICD-10-CM

## 2019-11-04 ENCOUNTER — Ambulatory Visit: Payer: PPO

## 2019-11-11 ENCOUNTER — Encounter: Payer: Self-pay | Admitting: Family Medicine

## 2019-11-11 ENCOUNTER — Ambulatory Visit (INDEPENDENT_AMBULATORY_CARE_PROVIDER_SITE_OTHER): Payer: PPO | Admitting: Family Medicine

## 2019-11-11 ENCOUNTER — Other Ambulatory Visit: Payer: Self-pay

## 2019-11-11 VITALS — BP 100/60 | HR 74 | Temp 97.8°F | Ht 59.5 in | Wt 176.5 lb

## 2019-11-11 DIAGNOSIS — I2510 Atherosclerotic heart disease of native coronary artery without angina pectoris: Secondary | ICD-10-CM | POA: Diagnosis not present

## 2019-11-11 DIAGNOSIS — J441 Chronic obstructive pulmonary disease with (acute) exacerbation: Secondary | ICD-10-CM

## 2019-11-11 DIAGNOSIS — Z0000 Encounter for general adult medical examination without abnormal findings: Secondary | ICD-10-CM | POA: Diagnosis not present

## 2019-11-11 DIAGNOSIS — J4530 Mild persistent asthma, uncomplicated: Secondary | ICD-10-CM | POA: Diagnosis not present

## 2019-11-11 DIAGNOSIS — I10 Essential (primary) hypertension: Secondary | ICD-10-CM | POA: Diagnosis not present

## 2019-11-11 MED ORDER — ALBUTEROL SULFATE (2.5 MG/3ML) 0.083% IN NEBU: 2.5000 mg | INHALATION_SOLUTION | Freq: Three times a day (TID) | RESPIRATORY_TRACT | 2 refills | Status: DC | PRN

## 2019-11-11 NOTE — Progress Notes (Signed)
Annual Exam   Chief Complaint:  Chief Complaint  Patient presents with  . Annual Exam    History of Present Illness:  [name] is a 79 y.o. No obstetric history on file. who LMP was No LMP recorded. Patient is postmenopausal., presents today for her annual examination.    Dyspnea with exertion With sustained moving around Sometimes has issues with in the grocery  Worse than it was last year No nighttime waking PND Lays flat at night  Nutrition She does get adequate calcium and Vitamin D in her diet. Diet: cheese, yogurt, meat, most veggies, salad greens - trying to avoid sweets and fastfood Exercise: getting SOB with activites  Safety The patient wears seatbelts: yes.     The patient feels safe at home and in their relationships: yes.   Dentist: has dentures - planning to get them realigned in the next month No vision issues - no recent eye  GYN She is not sexually active.    Lung Cancer Screening Annual screening for adults age 35-80 yo with 30 year pack history? Yes Current Tobacco user? No Quit less than 15 years ago? No Interested in low dose CT for lung cancer screening? no  Weight Wt Readings from Last 3 Encounters:  11/11/19 176 lb 8 oz (80.1 kg)  05/19/19 184 lb 4 oz (83.6 kg)  05/08/19 180 lb (81.6 kg)   Patient has high BMI  BMI Readings from Last 1 Encounters:  11/11/19 35.05 kg/m     Chronic disease screening Blood pressure monitoring:  BP Readings from Last 3 Encounters:  11/11/19 100/60  05/19/19 132/88  05/08/19 (!) 143/86    Lipid Monitoring: Indication for screening: age >71, obesity, diabetes, family hx, CV risk factors.  Lipid screening: Yes  Lab Results  Component Value Date   CHOL 188 11/02/2018   HDL 53.20 11/02/2018   LDLCALC 111 (H) 11/02/2018   TRIG 120.0 11/02/2018   CHOLHDL 4 11/02/2018     Diabetes Screening: age >81, overweight, family hx, PCOS, hx of gestational diabetes, at risk ethnicity Diabetes  Screening screening: Yes  No results found for: HGBA1C   Past Medical History:  Diagnosis Date  . Arthritis   . Asthma   . Cancer ([place]) 1990  . Depression     Past Surgical History:  Procedure Laterality Date  . BREAST SURGERY Left 1990  . CORONARY ARTERY BYPASS GRAFT    . JOINT REPLACEMENT Right    Hip  . OVARIAN CYST REMOVAL  1970  . TUBAL LIGATION  1970    Prior to Admission medications   Medication Sig Start Date End Date Taking? Authorizing Provider  albuterol (PROVENTIL HFA;VENTOLIN HFA) 108 (90 Base) MCG/ACT inhaler Inhale 2 puffs into the lungs every 4 (four) hours as needed for wheezing or shortness of breath. 04/07/18  Yes Lesleigh Noe, MD  albuterol (PROVENTIL) (2.5 MG/3ML) 0.083% nebulizer solution Take 3 mLs (2.5 mg total) by nebulization 3 (three) times daily as needed. 08/19/19  Yes Lesleigh Noe, MD  Azelastine HCl 0.15 % SOLN Place 1 spray into the nose daily as needed. 04/07/18  Yes Lesleigh Noe, MD  BREO ELLIPTA 200-25 MCG/INH AEPB TAKE 1 PUFF BY MOUTH EVERY DAY 09/13/19  Yes Lesleigh Noe, MD  Cholecalciferol (VITAMIN D-3) 125 MCG (5000 UT) TABS Take 1 tablet by mouth daily.   Yes [provider]  Flaxseed, Linseed, (FLAX SEEDS PO) Take 1 tablet by mouth daily.   Yes [provider]  ibuprofen (ADVIL,MOTRIN) 200 MG tablet Take 200 mg by mouth every 6 (six) hours as needed.   Yes [provider]  lisinopril (ZESTRIL) 20 MG tablet Take 1 tablet (20 mg total) by mouth in the morning and at bedtime. 08/27/19  Yes Lesleigh Noe, MD  loratadine (CLARITIN) 10 MG tablet Take 10 mg by mouth daily.   Yes [provider]  milk thistle 175 MG tablet Take 250 mg by mouth 3 (three) times daily.   Yes [provider]  Multiple Vitamins-Minerals (CENTRUM SILVER PO) Take 1 tablet by mouth daily.   Yes [provider]  NUTRITIONAL SUPPLEMENTS PO Take by mouth. Daily essentials for women- 1 packet a day   Yes [provider]  NUTRITIONAL SUPPLEMENTS PO Pro Nac- 1 capsule daily   Yes [provider]  NUTRITIONAL SUPPLEMENTS PO Hista support- 3 capsules a daily during allergy season   Yes [provider]  Probiotic Product (PROBIOTIC DAILY PO) Take 1 tablet by mouth daily.   Yes [provider]  SPIRIVA RESPIMAT 2.5 MCG/ACT AERS INHALE 2 PUFFS INTO THE LUNGS EVERY DAY 05/29/19  Yes Lesleigh Noe, MD    Allergies  Allergen Reactions  . Advair Diskus [Fluticasone-Salmeterol] Shortness Of Breath    As of 09/01/2014  . Levofloxacin     Nausea, brain fog, depression, as of 12/12/14  . Qvar [Beclomethasone]     Lungs closing-as of 05/03/2015  . Singulair [Montelukast] Shortness Of Breath  . Symbicort [Budesonide-Formoterol Fumarate]     Hard time breathing as of 10/31/2014.    Gynecologic History: No LMP recorded. Patient is postmenopausal.  Obstetric History: No obstetric history on file.  Social History   Socioeconomic History  . Marital status: Widowed    Spouse name: Not on file  . Number of children: 3  . Years of education: 2 associate degrees  . Highest education level: Not on file  Occupational History  . Not on file  Tobacco Use  . Smoking status: Former Smoker    Packs/day: 1.00    Years: 40.00    Pack years: 40.00    Types: Cigarettes    Quit date: 2002    Years since quitting: 19.4  . Smokeless tobacco: Never Used  Vaping Use  . Vaping Use: Never used  Substance and Sexual Activity  . Alcohol use: Not Currently  . Drug use: Never  . Sexual activity: Not Currently  Other Topics Concern  . Not on file  Social History Narrative   Moved from [place] with Daughter, [name]  (one daughter in [place] and the other in [place]) - 5 granddaughters and 2 great-grandchildren   Enjoys needle work, reading, cook   Exercise: not really, can walk 1/2 a mile at slow pace w/o getting SOB, SOB with stairs   Sleeps on the ground floor   Retired  from Press photographer (bookkeeper)   Social Determinants of Radio broadcast assistant Strain:   . Difficulty of Paying Living Expenses:   Food Insecurity:   . Worried About Charity fundraiser in the Last Year:   . Arboriculturist in the Last Year:   Transportation Needs:   . Film/video editor (Medical):   Marland Kitchen Lack of Transportation (Non-Medical):   Physical Activity:   . Days of Exercise per Week:   . Minutes of Exercise per Session:   Stress:   . Feeling of Stress :   Social Connections:   .  Frequency of Communication with Friends and Family:   . Frequency of Social Gatherings with Friends and Family:   . Attends Religious Services:   . Active Member of Clubs or Organizations:   . Attends Archivist Meetings:   Marland Kitchen Marital Status:   Intimate Partner Violence:   . Fear of Current or Ex-Partner:   . Emotionally Abused:   Marland Kitchen Physically Abused:   . Sexually Abused:     Family History  Problem Relation Age of Onset  . Cancer Father        lung  . Heart attack Father 36  . Alcohol abuse Father   . Arthritis Father   . COPD Father   . Diabetes Father   . Hypertension Father   . Breast cancer Sister 64  . Arthritis Sister   . Cancer Sister   . COPD Sister   . Drug abuse Sister   . Hypertension Sister   . Miscarriages / Stillbirths Sister   . Thyroid disease Sister   . Arthritis Mother   . COPD Mother   . Diabetes Mother   . Hearing loss Mother   . Hypertension Mother   . Miscarriages / [place] Mother   . Stroke Mother   . Alcohol abuse Brother   . Arthritis Brother   . Hypertension Brother   . Arthritis Daughter   . Asthma Daughter   . Depression Daughter   . Hypertension Daughter   . Miscarriages / [place] Daughter   . Thyroid disease Daughter   . Arthritis Maternal Grandmother   . Hypertension Maternal Grandmother   . Stroke Maternal Grandmother   . Thyroid disease Maternal Grandmother   . Alcohol abuse Maternal Grandfather   . Arthritis  Maternal Grandfather   . Cancer Maternal Grandfather   . Depression Maternal Grandfather   . Hearing loss Maternal Grandfather   . Hypertension Maternal Grandfather   . Arthritis Sister   . Thyroid disease Sister   . Thyroid disease Daughter   . Hypertension Daughter   . Depression Daughter   . COPD Daughter   . Alcohol abuse Daughter   . Arthritis Daughter   . Arthritis Daughter   . COPD Daughter   . Depression Daughter   . Hearing loss Daughter   . Hypertension Daughter   . Thyroid disease Daughter     Review of Systems  Constitutional: Negative for chills and fever.  HENT: Negative for congestion and sore throat.   Eyes: Negative for blurred vision and double vision.  Respiratory: Negative for shortness of breath.   Cardiovascular: Negative for chest pain.  Gastrointestinal: Negative for heartburn, nausea and vomiting.  Genitourinary: Negative.   Musculoskeletal: Negative.  Negative for myalgias.  Skin: Negative for rash.  Neurological: Negative for dizziness and headaches.  Endo/Heme/Allergies: Does not bruise/bleed easily.  Psychiatric/Behavioral: Negative for depression. The patient is not nervous/anxious.      Physical Exam BP 100/60   Pulse 74   Temp 97.8 F (36.6 C) (Temporal)   Ht 4' 11.5" (1.511 m)   Wt 176 lb 8 oz (80.1 kg)   SpO2 95%   BMI 35.05 kg/m    BP Readings from Last 3 Encounters:  11/11/19 100/60  05/19/19 132/88  05/08/19 (!) 143/86      Physical Exam Constitutional:      General: She is not in acute distress.    Appearance: She is well-developed. She is obese. She is not diaphoretic.  HENT:     Head: Normocephalic  and atraumatic.     Right Ear: External ear normal.     Left Ear: External ear normal.     Nose: Nose normal.  Eyes:     General: No scleral icterus.    Conjunctiva/sclera: Conjunctivae normal.  Cardiovascular:     Rate and Rhythm: Normal rate and regular rhythm.     Heart sounds: No murmur heard.   Pulmonary:      Effort: Pulmonary effort is normal. No respiratory distress.     Breath sounds: Decreased air movement present. Decreased breath sounds and wheezing present.  Abdominal:     General: Bowel sounds are normal. There is no distension.     Palpations: Abdomen is soft. There is no mass.     Tenderness: There is no abdominal tenderness. There is no guarding or rebound.  Musculoskeletal:        General: Normal range of motion.     Cervical back: Neck supple.  Lymphadenopathy:     Cervical: No cervical adenopathy.  Skin:    General: Skin is warm and dry.     Capillary Refill: Capillary refill takes less than 2 seconds.  Neurological:     Mental Status: She is alert and oriented to person, place, and time.     Deep Tendon Reflexes: Reflexes normal.  Psychiatric:        Behavior: Behavior normal.        Results:  PHQ-9:    Office Visit from 11/09/2018 in [place] [facility] Total Score 0        Assessment: 79 y.o. No obstetric history on file. female here for routine annual physical examination.  Plan: Problem List Items Addressed This Visit      Cardiovascular and Mediastinum   Hypertension   Relevant Orders   Comprehensive metabolic panel   CAD (coronary artery disease)   Relevant Orders   Lipid panel     Respiratory   Mild persistent asthma   Relevant Medications   albuterol (PROVENTIL) (2.5 MG/3ML) 0.083% nebulizer solution   COPD exacerbation (HCC)   Relevant Medications   albuterol (PROVENTIL) (2.5 MG/3ML) 0.083% nebulizer solution    Other Visit Diagnoses    Annual physical exam    -  Primary      Screening: -- Blood pressure screen normal -- cholesterol screening: will obtain -- Weight screening: overweight: continue to monitor -- Diabetes Screening: will obtain -- Nutrition: normal - encouraged calcium and healthy diet  The ASCVD Risk score Mikey Bussing DC Jr., et al., 2013) failed to calculate for the following reasons:   The 2013  ASCVD risk score is only valid for ages 48 to 64  -- Statin therapy for Age 7-75 with CVD risk >7.5%  --->> pt with CAD and discussed benefit of statin. She states she would not like to be on statin, stopped several years ago  Psych -- Depression screening (PHQ-9):    Office Visit from 11/09/2018 in [place] [facility]  PHQ-9 Total Score 0       Safety -- tobacco screening: not using -- alcohol screening:  low-risk usage. -- no evidence of domestic violence or intimate partner violence.   Cancer Screening --- aged out of cancer screening  Immunizations -- flu vaccine up to date -- TDAP q10 years up to date -- PPSV-23 (19-64 with chronic disease or smoking) up to date  Discussed covid-19 vaccine. She declined getting - due to not enough research. Discussed that she is high risk  and recommended she continue to wear masks in public.   Encouraged regular vision and dental care    Lesleigh Noe, MD

## 2019-11-11 NOTE — Patient Instructions (Addendum)
For sleep - try melatonin   Try to avoid drinking water 2 hours before bed Still drink during the day  Try albuterol before exercise  Make an eye doctor appointment    Would recommend the following:   1) 800 units of Vitamin D daily 2) Get 1200 mg of elemental calcium --- this is best from your diet. Try to track how much calcium you get on a typical day. You could find ways to add more (dairy products, leafy greens). Take a supplement for whatever you don't typically get so you reach 1200 mg of calcium.  3) Physical activity (ideally weight bearing) - like walking briskly 30 minutes 5 days a week.

## 2019-11-12 LAB — COMPREHENSIVE METABOLIC PANEL
ALT: 779 U/L — ABNORMAL HIGH (ref 0–35)
AST: 352 U/L — ABNORMAL HIGH (ref 0–37)
Albumin: 4.2 g/dL (ref 3.5–5.2)
Alkaline Phosphatase: 130 U/L — ABNORMAL HIGH (ref 39–117)
BUN: 33 mg/dL — ABNORMAL HIGH (ref 6–23)
CO2: 22 mEq/L (ref 19–32)
Calcium: 11.1 mg/dL — ABNORMAL HIGH (ref 8.4–10.5)
Chloride: 103 mEq/L (ref 96–112)
Creatinine, Ser: 0.8 mg/dL (ref 0.40–1.20)
GFR: 68.88 mL/min (ref >60.00)
Glucose, Bld: 84 mg/dL (ref 70–99)
Potassium: 5.2 mEq/L — ABNORMAL HIGH (ref 3.5–5.1)
Sodium: 138 mEq/L (ref 135–145)
Total Bilirubin: 0.4 mg/dL (ref 0.2–1.2)
Total Protein: 6.6 g/dL (ref 6.0–8.3)

## 2019-11-12 LAB — LIPID PANEL
Cholesterol: 148 mg/dL (ref 0–200)
HDL: 38.6 mg/dL — ABNORMAL LOW (ref >39.00)
LDL Cholesterol: 89 mg/dL (ref 0–99)
NonHDL: 109.22
Total CHOL/HDL Ratio: 4
Triglycerides: 103 mg/dL (ref 0.0–149.0)
VLDL: 20.6 mg/dL (ref 0.0–40.0)

## 2019-11-16 ENCOUNTER — Other Ambulatory Visit: Payer: Self-pay

## 2019-11-16 ENCOUNTER — Ambulatory Visit (INDEPENDENT_AMBULATORY_CARE_PROVIDER_SITE_OTHER): Payer: PPO | Admitting: Family Medicine

## 2019-11-16 VITALS — BP 130/78 | HR 74 | Temp 97.2°F | Wt 177.0 lb

## 2019-11-16 DIAGNOSIS — R7989 Other specified abnormal findings of blood chemistry: Secondary | ICD-10-CM | POA: Diagnosis not present

## 2019-11-16 DIAGNOSIS — J4530 Mild persistent asthma, uncomplicated: Secondary | ICD-10-CM

## 2019-11-16 LAB — PROTIME-INR
INR: 1.1 ratio — ABNORMAL HIGH (ref 0.8–1.0)
Prothrombin Time: 11.8 s (ref 9.6–13.1)

## 2019-11-16 LAB — COMPREHENSIVE METABOLIC PANEL
ALT: 702 U/L — ABNORMAL HIGH (ref 0–35)
AST: 306 U/L — ABNORMAL HIGH (ref 0–37)
Albumin: 3.9 g/dL (ref 3.5–5.2)
Alkaline Phosphatase: 120 U/L — ABNORMAL HIGH (ref 39–117)
BUN: 18 mg/dL (ref 6–23)
CO2: 26 mEq/L (ref 19–32)
Calcium: 11 mg/dL — ABNORMAL HIGH (ref 8.4–10.5)
Chloride: 104 mEq/L (ref 96–112)
Creatinine, Ser: 0.67 mg/dL (ref 0.40–1.20)
GFR: 84.52 mL/min (ref >60.00)
Glucose, Bld: 112 mg/dL — ABNORMAL HIGH (ref 70–99)
Potassium: 4.5 mEq/L (ref 3.5–5.1)
Sodium: 136 mEq/L (ref 135–145)
Total Bilirubin: 0.7 mg/dL (ref 0.2–1.2)
Total Protein: 6.3 g/dL (ref 6.0–8.3)

## 2019-11-16 LAB — CBC WITH DIFFERENTIAL/PLATELET
Basophils Absolute: 0 10*3/uL (ref 0.0–0.1)
Basophils Relative: 0.7 % (ref 0.0–3.0)
Eosinophils Absolute: 0.2 10*3/uL (ref 0.0–0.7)
Eosinophils Relative: 3.4 % (ref 0.0–5.0)
HCT: 41.7 % (ref 36.0–46.0)
Hemoglobin: 13.9 g/dL (ref 12.0–15.0)
Lymphocytes Relative: 27.4 % (ref 12.0–46.0)
Lymphs Abs: 1.9 10*3/uL (ref 0.7–4.0)
MCHC: 33.3 g/dL (ref 30.0–36.0)
MCV: 90.1 fl (ref 78.0–100.0)
Monocytes Absolute: 0.6 10*3/uL (ref 0.1–1.0)
Monocytes Relative: 9 % (ref 3.0–12.0)
Neutro Abs: 4.2 10*3/uL (ref 1.4–7.7)
Neutrophils Relative %: 59.5 % (ref 43.0–77.0)
Platelets: 615 10*3/uL — ABNORMAL HIGH (ref 150.0–400.0)
RBC: 4.63 Mil/uL (ref 3.87–5.11)
RDW: 15.7 % — ABNORMAL HIGH (ref 11.5–15.5)
WBC: 7 10*3/uL (ref 4.0–10.5)

## 2019-11-16 LAB — IBC PANEL
Iron: 182 ug/dL — ABNORMAL HIGH (ref 42–145)
Saturation Ratios: 62.5 % — ABNORMAL HIGH (ref 20.0–50.0)
Transferrin: 208 mg/dL — ABNORMAL LOW (ref 212.0–360.0)

## 2019-11-16 LAB — FERRITIN: Ferritin: 739.4 ng/mL — ABNORMAL HIGH (ref 10.0–291.0)

## 2019-11-16 MED ORDER — ALBUTEROL SULFATE HFA 108 (90 BASE) MCG/ACT IN AERS: 2.0000 | INHALATION_SPRAY | RESPIRATORY_TRACT | 3 refills | Status: DC | PRN

## 2019-11-16 NOTE — Assessment & Plan Note (Signed)
Pt with new elevation in LFTs, Ca, Alk Phos. Discussed concern for acute hepatitis or cancer of some kind. Advised urgent blood work and liver ultrasound. She would like to see what the blood work shows before getting the US. Reassuring is she seems to be asymptomatic and no other risk factors.  

## 2019-11-16 NOTE — Patient Instructions (Signed)
Liver tests today  Will call you when the results come back

## 2019-11-16 NOTE — Progress Notes (Signed)
Subjective:     Nancy Merritt is a 79 y.o. female presenting for Follow-up (abnormal labs )     HPI  #elevated liver function tests - does not drink alcohol - does not take tylenol - does take ibuprofen occasionally  - did have period of easy bruising which has resolved - no hx of blood transfusion - no family hx of liver issues - no belly pain - endorses some belly pain  Review of Systems   Social History   Tobacco Use  Smoking Status Former Smoker  . Packs/day: 1.00  . Years: 40.00  . Pack years: 40.00  . Types: Cigarettes  . Quit date: 2002  . Years since quitting: 19.5  Smokeless Tobacco Never Used        Objective:    BP Readings from Last 3 Encounters:  11/16/19 130/78  11/11/19 100/60  05/19/19 132/88   Wt Readings from Last 3 Encounters:  11/16/19 177 lb (80.3 kg)  11/11/19 176 lb 8 oz (80.1 kg)  05/19/19 184 lb 4 oz (83.6 kg)    BP 130/78   Pulse 74   Temp (!) 97.2 F (36.2 C) (Temporal)   Wt 177 lb (80.3 kg)   SpO2 95%   BMI 35.15 kg/m    Physical Exam Constitutional:      General: She is not in acute distress.    Appearance: She is well-developed. She is obese. She is not diaphoretic.  HENT:     Right Ear: External ear normal.     Left Ear: External ear normal.     Nose: Nose normal.  Eyes:     General: No scleral icterus.    Conjunctiva/sclera: Conjunctivae normal.  Cardiovascular:     Rate and Rhythm: Normal rate and regular rhythm.     Heart sounds: No murmur heard.   Pulmonary:     Effort: Pulmonary effort is normal. No respiratory distress.     Breath sounds: Wheezing and rhonchi present. No rales.  Abdominal:     General: Bowel sounds are normal. There is no distension.     Palpations: Abdomen is soft. There is no hepatomegaly.     Tenderness: There is no abdominal tenderness. There is no guarding or rebound.  Musculoskeletal:     Cervical back: Neck supple.  Skin:    General: Skin is warm and dry.      Capillary Refill: Capillary refill takes less than 2 seconds.  Neurological:     Mental Status: She is alert. Mental status is at baseline.  Psychiatric:        Mood and Affect: Mood normal.        Behavior: Behavior normal.           Assessment & Plan:   Problem List Items Addressed This Visit      Respiratory   Mild persistent asthma    Refill as inhalers expired. Pt to try using prior to exercise.       Relevant Medications   albuterol (VENTOLIN HFA) 108 (90 Base) MCG/ACT inhaler     Other   Elevated liver function tests - Primary    Pt with new elevation in LFTs, Ca, Alk Phos. Discussed concern for acute hepatitis or cancer of some kind. Advised urgent blood work and liver ultrasound. She would like to see what the blood work shows before getting the [place]. Reassuring is she seems to be asymptomatic and no other risk factors.       Relevant Orders  Comprehensive metabolic panel   CBC with Differential   Protime-INR   Hepatitis panel, acute   IBC panel   Ferritin   US Abdomen Limited RUQ       Return for based on results.  Lesleigh Noe, MD  This visit occurred during the SARS-CoV-2 public health emergency.  Safety protocols were in place, including screening questions prior to the visit, additional usage of staff PPE, and extensive cleaning of exam room while observing appropriate contact time as indicated for disinfecting solutions.

## 2019-11-16 NOTE — Assessment & Plan Note (Signed)
Refill as inhalers expired. Pt to try using prior to exercise.

## 2019-11-17 ENCOUNTER — Telehealth: Payer: Self-pay | Admitting: Family Medicine

## 2019-11-17 ENCOUNTER — Telehealth: Payer: Self-pay | Admitting: Oncology

## 2019-11-17 DIAGNOSIS — R7989 Other specified abnormal findings of blood chemistry: Secondary | ICD-10-CM

## 2019-11-17 LAB — HEPATITIS PANEL, ACUTE
Hep A IgM: NONREACTIVE
Hep B C IgM: NONREACTIVE
Hepatitis B Surface Ag: NONREACTIVE
Hepatitis C Ab: NONREACTIVE
SIGNAL TO CUT-OFF: 0.01 (ref <1.00)

## 2019-11-17 NOTE — Telephone Encounter (Signed)
Received an urgent referral from Dr. Einar Pheasant for elevated liver function tests and elevated ferritin. Nancy Merritt has been cld and scheduled to see Dr. Alen Blew on 7/2 at 2pm. [facility] been made aware to arrive 15 minutes early. I provided the address and phone # to our office.

## 2019-11-17 NOTE — Telephone Encounter (Signed)
Called to review abnormal iron studies Referral to hematology Other work-up reassuring - no sign of hepatitis infection  Again discussed red flags with elevated liver studies   She will delay ultrasound until she speaks with hematology incase other imaging study is recommended

## 2019-11-18 ENCOUNTER — Ambulatory Visit (INDEPENDENT_AMBULATORY_CARE_PROVIDER_SITE_OTHER): Payer: PPO

## 2019-11-18 VITALS — BP 130/78 | Wt 173.2 lb

## 2019-11-18 DIAGNOSIS — Z0000 Encounter for general adult medical examination without abnormal findings: Secondary | ICD-10-CM | POA: Diagnosis not present

## 2019-11-18 NOTE — Patient Instructions (Signed)
Nancy Merritt , Thank you for taking time to come for your Medicare Wellness Visit. I appreciate your ongoing commitment to your health goals. Please review the following plan we discussed and let me know if I can assist you in the future.   Screening recommendations/referrals: Colonoscopy: no longer required Mammogram: no longer required Bone Density: declined Recommended yearly ophthalmology/optometry visit for glaucoma screening and checkup Recommended yearly dental visit for hygiene and checkup  Vaccinations: Influenza vaccine: Up to date, completed 05/19/2019, due 12/2019 Pneumococcal vaccine: Completed series Tdap vaccine: Up to date, completed 08/15/2015, due 07/2025 Shingles vaccine: due, check with insurance for coverage   Covid-19:declined  Advanced directives: Please bring a copy of your POA (Power of Attorney) and/or Living Will to your next appointment.   Conditions/risks identified: hypertension  Next appointment: Follow up in one year for your annual wellness visit    Preventive Care 41 Years and Older, Female Preventive care refers to lifestyle choices and visits with your health care provider that can promote health and wellness. What does preventive care include?  A yearly physical exam. This is also called an annual well check.  Dental exams once or twice a year.  Routine eye exams. Ask your health care provider how often you should have your eyes checked.  Personal lifestyle choices, including:  Daily care of your teeth and gums.  Regular physical activity.  Eating a healthy diet.  Avoiding tobacco and drug use.  Limiting alcohol use.  Practicing safe sex.  Taking low-dose aspirin every day.  Taking vitamin and mineral supplements as recommended by your health care provider. What happens during an annual well check? The services and screenings done by your health care provider during your annual well check will depend on your age, overall health,  lifestyle risk factors, and family history of disease. Counseling  Your health care provider may ask you questions about your:  Alcohol use.  Tobacco use.  Drug use.  Emotional well-being.  Home and relationship well-being.  Sexual activity.  Eating habits.  History of falls.  Memory and ability to understand (cognition).  Work and work Statistician.  Reproductive health. Screening  You may have the following tests or measurements:  Height, weight, and BMI.  Blood pressure.  Lipid and cholesterol levels. These may be checked every 5 years, or more frequently if you are over 75 years old.  Skin check.  Lung cancer screening. You may have this screening every year starting at age 51 if you have a 30-pack-year history of smoking and currently smoke or have quit within the past 15 years.  Fecal occult blood test (FOBT) of the stool. You may have this test every year starting at age 71.  Flexible sigmoidoscopy or colonoscopy. You may have a sigmoidoscopy every 5 years or a colonoscopy every 10 years starting at age 13.  Hepatitis C blood test.  Hepatitis B blood test.  Sexually transmitted disease (STD) testing.  Diabetes screening. This is done by checking your blood sugar (glucose) after you have not eaten for a while (fasting). You may have this done every 1-3 years.  Bone density scan. This is done to screen for osteoporosis. You may have this done starting at age 33.  Mammogram. This may be done every 1-2 years. Talk to your health care provider about how often you should have regular mammograms. Talk with your health care provider about your test results, treatment options, and if necessary, the need for more tests. Vaccines  Your health care  provider may recommend certain vaccines, such as:  Influenza vaccine. This is recommended every year.  Tetanus, diphtheria, and acellular pertussis (Tdap, Td) vaccine. You may need a Td booster every 10 years.  Zoster  vaccine. You may need this after age 67.  Pneumococcal 13-valent conjugate (PCV13) vaccine. One dose is recommended after age 17.  Pneumococcal polysaccharide (PPSV23) vaccine. One dose is recommended after age 67. Talk to your health care provider about which screenings and vaccines you need and how often you need them. This information is not intended to replace advice given to you by your health care provider. Make sure you discuss any questions you have with your health care provider. Document Released: 06/02/2015 Document Revised: 01/24/2016 Document Reviewed: 03/07/2015 Elsevier Interactive Patient Education  2017 [place] Prevention in the Home Falls can cause injuries. They can happen to people of all ages. There are many things you can do to make your home safe and to help prevent falls. What can I do on the outside of my home?  Regularly fix the edges of walkways and driveways and fix any cracks.  Remove anything that might make you trip as you walk through a door, such as a raised step or threshold.  Trim any bushes or trees on the path to your home.  Use bright outdoor lighting.  Clear any walking paths of anything that might make someone trip, such as rocks or tools.  Regularly check to see if handrails are loose or broken. Make sure that both sides of any steps have handrails.  Any raised decks and porches should have guardrails on the edges.  Have any leaves, snow, or ice cleared regularly.  Use sand or salt on walking paths during winter.  Clean up any spills in your garage right away. This includes oil or grease spills. What can I do in the bathroom?  Use night lights.  Install grab bars by the toilet and in the tub and shower. Do not use towel bars as grab bars.  Use non-skid mats or decals in the tub or shower.  If you need to sit down in the shower, use a plastic, non-slip stool.  Keep the floor dry. Clean up any water that spills on the  floor as soon as it happens.  Remove soap buildup in the tub or shower regularly.  Attach bath mats securely with double-sided non-slip rug tape.  Do not have throw rugs and other things on the floor that can make you trip. What can I do in the bedroom?  Use night lights.  Make sure that you have a light by your bed that is easy to reach.  Do not use any sheets or blankets that are too big for your bed. They should not hang down onto the floor.  Have a firm chair that has side arms. You can use this for support while you get dressed.  Do not have throw rugs and other things on the floor that can make you trip. What can I do in the kitchen?  Clean up any spills right away.  Avoid walking on wet floors.  Keep items that you use a lot in easy-to-reach places.  If you need to reach something above you, use a strong step stool that has a grab bar.  Keep electrical cords out of the way.  Do not use floor polish or wax that makes floors slippery. If you must use wax, use non-skid floor wax.  Do not have throw  rugs and other things on the floor that can make you trip. What can I do with my stairs?  Do not leave any items on the stairs.  Make sure that there are handrails on both sides of the stairs and use them. Fix handrails that are broken or loose. Make sure that handrails are as long as the stairways.  Check any carpeting to make sure that it is firmly attached to the stairs. Fix any carpet that is loose or worn.  Avoid having throw rugs at the top or bottom of the stairs. If you do have throw rugs, attach them to the floor with carpet tape.  Make sure that you have a light switch at the top of the stairs and the bottom of the stairs. If you do not have them, ask someone to add them for you. What else can I do to help prevent falls?  Wear shoes that:  Do not have high heels.  Have rubber bottoms.  Are comfortable and fit you well.  Are closed at the toe. Do not wear  sandals.  If you use a stepladder:  Make sure that it is fully opened. Do not climb a closed stepladder.  Make sure that both sides of the stepladder are locked into place.  Ask someone to hold it for you, if possible.  Clearly mark and make sure that you can see:  Any grab bars or handrails.  First and last steps.  Where the edge of each step is.  Use tools that help you move around (mobility aids) if they are needed. These include:  Canes.  Walkers.  Scooters.  Crutches.  Turn on the lights when you go into a dark area. Replace any light bulbs as soon as they burn out.  Set up your furniture so you have a clear path. Avoid moving your furniture around.  If any of your floors are uneven, fix them.  If there are any pets around you, be aware of where they are.  Review your medicines with your doctor. Some medicines can make you feel dizzy. This can increase your chance of falling. Ask your doctor what other things that you can do to help prevent falls. This information is not intended to replace advice given to you by your health care provider. Make sure you discuss any questions you have with your health care provider. Document Released: 03/02/2009 Document Revised: 10/12/2015 Document Reviewed: 06/10/2014 Elsevier Interactive Patient Education  2017 [employer].

## 2019-11-18 NOTE — Progress Notes (Signed)
Subjective:   Nancy Merritt is a 79 y.o. female who presents for Medicare Annual (Subsequent) preventive examination.  Review of Systems: N/A      I connected with the patient today by telephone and verified that I am speaking with the correct person using two identifiers. Location patient: home Location nurse: work Persons participating in the virtual visit: patient, Marine scientist.   I discussed the limitations, risks, security and privacy concerns of performing an evaluation and management service by telephone and the availability of in person appointments. I also discussed with the patient that there may be a patient responsible charge related to this service. The patient expressed understanding and verbally consented to this telephonic visit.    Interactive audio and video telecommunications were attempted between this nurse and patient, however failed, due to patient having technical difficulties OR patient did not have access to video capability.  We continued and completed visit with audio only.     Cardiac Risk Factors include: advanced age (>86men, >30 women);hypertension     Objective:    Today's Vitals   11/18/19 1524  BP: 130/78  Weight: 173 lb 4 oz (78.6 kg)   Body mass index is 34.41 kg/m.  Advanced Directives 11/18/2019  Does Patient Have a Medical Advance Directive? Yes  Type of Paramedic of [place];Living will  Copy of [place] in Chart? No - copy requested    Current Medications (verified) Outpatient Encounter Medications as of 11/18/2019  Medication Sig  . albuterol (PROVENTIL) (2.5 MG/3ML) 0.083% nebulizer solution Take 3 mLs (2.5 mg total) by nebulization 3 (three) times daily as needed.  Marland Kitchen albuterol (VENTOLIN HFA) 108 (90 Base) MCG/ACT inhaler Inhale 2 puffs into the lungs every 4 (four) hours as needed for wheezing or shortness of breath.  . Azelastine HCl 0.15 % SOLN Place 1 spray into the nose daily as needed.    Marland Kitchen BREO ELLIPTA 200-25 MCG/INH AEPB TAKE 1 PUFF BY MOUTH EVERY DAY  . Cholecalciferol (VITAMIN D-3) 125 MCG (5000 UT) TABS Take 1 tablet by mouth daily.  . Flaxseed, Linseed, (FLAX SEEDS PO) Take 1 tablet by mouth daily.  Marland Kitchen ibuprofen (ADVIL,MOTRIN) 200 MG tablet Take 200 mg by mouth every 6 (six) hours as needed.  Marland Kitchen lisinopril (ZESTRIL) 20 MG tablet Take 1 tablet (20 mg total) by mouth in the morning and at bedtime.  Marland Kitchen loratadine (CLARITIN) 10 MG tablet Take 10 mg by mouth daily.  . milk thistle 175 MG tablet Take 250 mg by mouth 3 (three) times daily.  . Multiple Vitamins-Minerals (CENTRUM SILVER PO) Take 1 tablet by mouth daily.  Marland Kitchen NUTRITIONAL SUPPLEMENTS PO Take by mouth. Daily essentials for women- 1 packet a day  . NUTRITIONAL SUPPLEMENTS PO Pro Nac- 1 capsule daily  . NUTRITIONAL SUPPLEMENTS PO [place] support- 3 capsules a daily during allergy season  . Probiotic Product (PROBIOTIC DAILY PO) Take 1 tablet by mouth daily.  Marland Kitchen SPIRIVA RESPIMAT 2.5 MCG/ACT AERS INHALE 2 PUFFS INTO THE LUNGS EVERY DAY   No facility-administered encounter medications on file as of 11/18/2019.    Allergies (verified) Advair diskus [fluticasone-salmeterol], Levofloxacin, Qvar [beclomethasone], Singulair [montelukast], and Symbicort [budesonide-formoterol fumarate]   History: Past Medical History:  Diagnosis Date  . Arthritis   . Asthma   . Cancer ([place]) 1990  . Depression    Past Surgical History:  Procedure Laterality Date  . BREAST SURGERY Left 1990  . CORONARY ARTERY BYPASS GRAFT    . JOINT REPLACEMENT  Right    Hip  . OVARIAN CYST REMOVAL  1970  . TUBAL LIGATION  1970   Family History  Problem Relation Age of Onset  . Cancer Father        lung  . Heart attack Father 24  . Alcohol abuse Father   . Arthritis Father   . COPD Father   . Diabetes Father   . Hypertension Father   . Breast cancer Sister 95  . Arthritis Sister   . Cancer Sister   . COPD Sister   . Drug abuse Sister   .  Hypertension Sister   . Miscarriages / Stillbirths Sister   . Thyroid disease Sister   . Arthritis Mother   . COPD Mother   . Diabetes Mother   . Hearing loss Mother   . Hypertension Mother   . Miscarriages / [place] Mother   . Stroke Mother   . Alcohol abuse Brother   . Arthritis Brother   . Hypertension Brother   . Arthritis Daughter   . Asthma Daughter   . Depression Daughter   . Hypertension Daughter   . Miscarriages / [place] Daughter   . Thyroid disease Daughter   . Arthritis Maternal Grandmother   . Hypertension Maternal Grandmother   . Stroke Maternal Grandmother   . Thyroid disease Maternal Grandmother   . Alcohol abuse Maternal Grandfather   . Arthritis Maternal Grandfather   . Cancer Maternal Grandfather   . Depression Maternal Grandfather   . Hearing loss Maternal Grandfather   . Hypertension Maternal Grandfather   . Arthritis Sister   . Thyroid disease Sister   . Thyroid disease Daughter   . Hypertension Daughter   . Depression Daughter   . COPD Daughter   . Alcohol abuse Daughter   . Arthritis Daughter   . Arthritis Daughter   . COPD Daughter   . Depression Daughter   . Hearing loss Daughter   . Hypertension Daughter   . Thyroid disease Daughter    Social History   Socioeconomic History  . Marital status: Widowed    Spouse name: Not on file  . Number of children: 3  . Years of education: 2 associate degrees  . Highest education level: Not on file  Occupational History  . Not on file  Tobacco Use  . Smoking status: Former Smoker    Packs/day: 1.00    Years: 40.00    Pack years: 40.00    Types: Cigarettes    Quit date: 2002    Years since quitting: 19.5  . Smokeless tobacco: Never Used  Vaping Use  . Vaping Use: Never used  Substance and Sexual Activity  . Alcohol use: Not Currently  . Drug use: Never  . Sexual activity: Not Currently  Other Topics Concern  . 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 Difficulty of Paying Living Expenses: Not hard at all  Food Insecurity: No Food Insecurity  . Worried About Charity fundraiser in the Last Year: Never true  .  Ran Out of Food in the Last Year: Never true  Transportation Needs: No Transportation Needs  . Lack of Transportation (Medical): No  . Lack of Transportation (Non-Medical): No  Physical Activity: Insufficiently Active  . Days of Exercise per Week: 3 days  . Minutes of Exercise per Session: 20 min  Stress: No Stress Concern Present  . Feeling of Stress : Not at all  Social Connections:   . Frequency of Communication with Friends and Family:   . Frequency of Social Gatherings with Friends and Family:   . Attends Religious Services:   . Active Member of Clubs or Organizations:   . Attends Archivist Meetings:   Marland Kitchen Marital Status:     Tobacco Counseling Counseling given: Not Answered   Clinical Intake:  Pre-visit preparation completed: Yes  Pain : No/denies pain     Nutritional Status: BMI > 30  Obese Nutritional Risks: None Diabetes: No  How often do you need to have someone help you when you read instructions, pamphlets, or other written materials from your doctor or pharmacy?: 1 - Never What is the last grade level you completed in school?: 2 associate degrees  Diabetic: No Nutrition Risk Assessment:  Has the patient had any N/V/D within the last 2 months?  No  Does the patient have any non-healing wounds?  No  Has the patient had any unintentional weight loss or weight gain?  No   Diabetes:  Is the patient  diabetic?  No  If diabetic, was a CBG obtained today?  No  Did the patient bring in their glucometer from home?  No  How often do you monitor your CBG's? N/A.   Financial Strains and Diabetes Management:  Are you having any financial strains with the device, your supplies or your medication? No .  Does the patient want to be seen by Chronic Care Management for management of their diabetes?  No  Would the patient like to be referred to a Nutritionist or for Diabetic Management?  No   Interpreter Needed?: No  Information entered by :: CJohnson, LPN   Activities of Daily Living In your present state of health, do you have any difficulty performing the following activities: 11/18/2019  Hearing? Y  Comment wears hearing aids  Vision? N  Difficulty concentrating or making decisions? N  Walking or climbing stairs? N  Dressing or bathing? N  Doing errands, shopping? N  Preparing Food and eating ? N  Using the Toilet? N  In the past six months, have you accidently leaked urine? Y  Comment stress incontinence  Do you have problems with loss of bowel control? N  Managing your Medications? N  Managing your Finances? N  Housekeeping or managing your Housekeeping? N  Some recent data might be hidden    Patient Care Team: Lesleigh Noe, MD as PCP - General (Family Medicine) Margaretha Seeds, MD as Consulting Physician (Pulmonary Disease)  Indicate any recent Medical Services you may have received from other than [facility] providers in the past year (date may be approximate).     Assessment:   This is a routine wellness examination for [place].  Hearing/Vision screen  Hearing Screening   125Hz  250Hz  500Hz  1000Hz  2000Hz  3000Hz  4000Hz  6000Hz  8000Hz   Right ear:           Left ear:           Vision Screening Comments: Patient gets annual eye exams   Dietary issues and exercise activities discussed: Current  Exercise Habits: Home exercise routine, Type of exercise: treadmill, Time  (Minutes): 15, Frequency (Times/Week): 3, Weekly Exercise (Minutes/Week): 45, Intensity: Moderate, Exercise limited by: None identified  Goals    . Patient Stated     11/18/2019, I will continue to walk on my treadmill for 3 days a week for 15 minutes.       Depression Screen PHQ 2/9 Scores 11/18/2019 11/11/2019 11/09/2018 05/26/2018 04/07/2018  PHQ - 2 Score 0 0 0 0 6  PHQ- 9 Score 0 2 0 4 18    Fall Risk Fall Risk  11/18/2019 11/09/2018 05/26/2018  Falls in the past year? 0 0 0  Number falls in past yr: 0 0 0  Injury with Fall? 0 0 0  Risk for fall due to : Medication side effect - -  Follow up Falls evaluation completed;Falls prevention discussed - -    Any stairs in or around the home? Yes  If so, are there any without handrails? No  Home free of loose throw rugs in walkways, pet beds, electrical cords, etc? Yes  Adequate lighting in your home to reduce risk of falls? Yes   ASSISTIVE DEVICES UTILIZED TO PREVENT FALLS:  Life alert? No  Use of a cane, walker or w/c? Yes  Grab bars in the bathroom? Yes  Shower chair or bench in shower? No  Elevated toilet seat or a handicapped toilet? No   TIMED UP AND GO:  Was the test performed? N/A, telephonic visit.   Cognitive Function: MMSE - Mini Mental State Exam 11/18/2019  Not completed: Refused      Mini Cog  Mini-Cog screen was completed. Maximum score is 22. A value of 0 denotes this part of the MMSE was not completed or the patient failed this part of the Mini-Cog screening.  6CIT Screen 11/11/2019  What Year? 4 points    Immunizations Immunization History  Administered Date(s) Administered  . Influenza, High Dose Seasonal PF 04/07/2018  . Influenza,inj,Quad PF,6+ Mos 05/19/2019  . Pneumococcal Conjugate-13 11/09/2018  . Pneumococcal Polysaccharide-23 08/15/2015  . Tdap 08/15/2015  . Zoster 08/15/2015    TDAP status: Up to date Flu Vaccine status: Up to date Pneumococcal vaccine status: Up to date Covid-19 vaccine  status: Declined, Education has been provided regarding the importance of this vaccine but patient still declined. Advised may receive this vaccine at local pharmacy or Health Dept.or vaccine clinic. Aware to provide a copy of the vaccination record if obtained from local pharmacy or Health Dept. Verbalized acceptance and understanding.  Qualifies for Shingles Vaccine? Yes   Zostavax completed Yes   Shingrix Completed?: No.    Education has been provided regarding the importance of this vaccine. Patient has been advised to call insurance company to determine out of pocket expense if they have not yet received this vaccine. Advised may also receive vaccine at local pharmacy or Health Dept. Verbalized acceptance and understanding.  Screening Tests Health Maintenance  Topic Date Due  . COVID-19 Vaccine (1) 11/27/2019 (Originally 02/06/1953)  . INFLUENZA VACCINE  12/19/2019  . TETANUS/TDAP  08/14/2025  . PNA vac Low Risk Adult  Completed  . DEXA SCAN  Discontinued    Health Maintenance  There are no preventive care reminders to display for this patient.  Colorectal cancer screening: No longer required.  Mammogram status: No longer required.  Bone Density status: declined  Lung Cancer Screening: (Low Dose CT Chest recommended if Age 56-80 years, 30 pack-year currently smoking OR have quit w/in 15 years.)  does not qualify.    Additional Screening:  Hepatitis C Screening: does not qualify; Completed N/A  Vision Screening: Recommended annual ophthalmology exams for early detection of glaucoma and other disorders of the eye. Is the patient up to date with their annual eye exam?  No Who is the provider or what is the name of the office in which the patient attends annual eye exams? Does not have one If pt is not established with a provider, would they like to be referred to a provider to establish care? No .   Dental Screening: Recommended annual dental exams for proper oral  hygiene  Community Resource Referral / Chronic Care Management: CRR required this visit?  No   CCM required this visit?  No      Plan:     I have personally reviewed and noted the following in the patient's chart:   . Medical and social history . Use of alcohol, tobacco or illicit drugs  . Current medications and supplements . Functional ability and status . Nutritional status . Physical activity . Advanced directives . List of other physicians . Hospitalizations, surgeries, and ER visits in previous 12 months . Vitals . Screenings to include cognitive, depression, and falls . Referrals and appointments  In addition, I have reviewed and discussed with patient certain preventive protocols, quality metrics, and best practice recommendations. A written personalized care plan for preventive services as well as general preventive health recommendations were provided to patient.   Due to this being a telephonic visit, the after visit summary with patients personalized plan was offered to patient via mail or my-chart. Patient preferred to pick up at office at next visit.   Andrez Grime, LPN   01/23/453

## 2019-11-18 NOTE — Progress Notes (Signed)
PCP notes:  Health Maintenance: COVID- declined Shingrix- declined   Abnormal Screenings: none   Patient concerns: none   Nurse concerns: none   Next PCP appt: none

## 2019-11-19 ENCOUNTER — Inpatient Hospital Stay: Payer: PPO

## 2019-11-19 ENCOUNTER — Other Ambulatory Visit: Payer: Self-pay

## 2019-11-19 ENCOUNTER — Inpatient Hospital Stay: Payer: PPO | Attending: Oncology | Admitting: Oncology

## 2019-11-19 ENCOUNTER — Telehealth: Payer: Self-pay | Admitting: Oncology

## 2019-11-19 DIAGNOSIS — Z809 Family history of malignant neoplasm, unspecified: Secondary | ICD-10-CM | POA: Insufficient documentation

## 2019-11-19 DIAGNOSIS — J45909 Unspecified asthma, uncomplicated: Secondary | ICD-10-CM | POA: Diagnosis not present

## 2019-11-19 DIAGNOSIS — R7401 Elevation of levels of liver transaminase levels: Secondary | ICD-10-CM | POA: Insufficient documentation

## 2019-11-19 DIAGNOSIS — Z801 Family history of malignant neoplasm of trachea, bronchus and lung: Secondary | ICD-10-CM

## 2019-11-19 DIAGNOSIS — Z87891 Personal history of nicotine dependence: Secondary | ICD-10-CM | POA: Insufficient documentation

## 2019-11-19 DIAGNOSIS — R7989 Other specified abnormal findings of blood chemistry: Secondary | ICD-10-CM | POA: Diagnosis not present

## 2019-11-19 DIAGNOSIS — Z803 Family history of malignant neoplasm of breast: Secondary | ICD-10-CM | POA: Diagnosis not present

## 2019-11-19 NOTE — Telephone Encounter (Signed)
Scheduled per 07/02 los, patient received updated calender.

## 2019-11-19 NOTE — Progress Notes (Signed)
Reason for the request:    Elevated iron levels.  HPI: I was asked by Dr. Einar Pheasant  to evaluate Nancy Merritt for evaluation of elevated iron studies. She is an 79 year old woman with history of asthma and no active health issues. She had a routine laboratory data on 11/11/2019 which showed elevation in her liver function tests including alkaline phosphatase at 130, AST of 352 and ALT of 779. Repeat testing on November 16, 2019 showed her alkaline phosphatase 120, AST of 306 and ALT of 702. Bilirubin was normal. Iron studies were obtained showed iron level of 182 and ferritin of 739. Hepatitis panel was not detected. Previous labs in June 2020 showed normal liver function test. In December 2020 she also had normal LFTs.  Clinically, she reports no specific complaints at this time. She denies any abdominal pain, early satiety or weight loss. Denies excessive fatigue or tiredness. Performance status quality of life remained intact. Is able to drive but lives with her daughter currently.  She does not report any headaches, blurry vision, syncope or seizures. Does not report any fevers, chills or sweats.  Does not report any cough, wheezing or hemoptysis.  Does not report any chest pain, palpitation, orthopnea or leg edema.  Does not report any nausea, vomiting or abdominal pain.  Does not report any constipation or diarrhea.  Does not report any skeletal complaints.    Does not report frequency, urgency or hematuria.  Does not report any skin rashes or lesions. Does not report any heat or cold intolerance.  Does not report any lymphadenopathy or petechiae.  Does not report any anxiety or depression.  Remaining review of systems is negative.    Past Medical History:  Diagnosis Date   Arthritis    Asthma    Cancer ([place]) 1990   Depression   :  Past Surgical History:  Procedure Laterality Date   BREAST SURGERY Left 1990   CORONARY ARTERY BYPASS GRAFT     JOINT REPLACEMENT Right    Hip   OVARIAN  CYST REMOVAL  1970   TUBAL LIGATION  1970  :   Current Outpatient Medications:    albuterol (PROVENTIL) (2.5 MG/3ML) 0.083% nebulizer solution, Take 3 mLs (2.5 mg total) by nebulization 3 (three) times daily as needed., Disp: 75 mL, Rfl: 2   albuterol (VENTOLIN HFA) 108 (90 Base) MCG/ACT inhaler, Inhale 2 puffs into the lungs every 4 (four) hours as needed for wheezing or shortness of breath., Disp: 6.7 g, Rfl: 3   Azelastine HCl 0.15 % SOLN, Place 1 spray into the nose daily as needed., Disp: 30 mL, Rfl: 1   BREO ELLIPTA 200-25 MCG/INH AEPB, TAKE 1 PUFF BY MOUTH EVERY DAY, Disp: 60 each, Rfl: 5   Cholecalciferol (VITAMIN D-3) 125 MCG (5000 UT) TABS, Take 1 tablet by mouth daily., Disp: , Rfl:    Flaxseed, Linseed, (FLAX SEEDS PO), Take 1 tablet by mouth daily., Disp: , Rfl:    ibuprofen (ADVIL,MOTRIN) 200 MG tablet, Take 200 mg by mouth every 6 (six) hours as needed., Disp: , Rfl:    lisinopril (ZESTRIL) 20 MG tablet, Take 1 tablet (20 mg total) by mouth in the morning and at bedtime., Disp: 180 tablet, Rfl: 2   loratadine (CLARITIN) 10 MG tablet, Take 10 mg by mouth daily., Disp: , Rfl:    milk thistle 175 MG tablet, Take 250 mg by mouth 3 (three) times daily., Disp: , Rfl:    Multiple Vitamins-Minerals (CENTRUM SILVER PO), Take 1 tablet  by mouth daily., Disp: , Rfl:    NUTRITIONAL SUPPLEMENTS PO, Take by mouth. Daily essentials for women- 1 packet a day, Disp: , Rfl:    NUTRITIONAL SUPPLEMENTS PO, Pro Nac- 1 capsule daily, Disp: , Rfl:    NUTRITIONAL SUPPLEMENTS PO, Hista support- 3 capsules a daily during allergy season, Disp: , Rfl:    Probiotic Product (PROBIOTIC DAILY PO), Take 1 tablet by mouth daily., Disp: , Rfl:    SPIRIVA RESPIMAT 2.5 MCG/ACT AERS, INHALE 2 PUFFS INTO THE LUNGS EVERY DAY, Disp: 12 g, Rfl: 2:  Allergies  Allergen Reactions   Advair Diskus [Fluticasone-Salmeterol] Shortness Of Breath    As of 09/01/2014   Levofloxacin     Nausea, brain fog,  depression, as of 12/12/14   Qvar [Beclomethasone]     Lungs closing-as of 05/03/2015   Singulair [Montelukast] Shortness Of Breath   Symbicort [Budesonide-Formoterol Fumarate]     Hard time breathing as of 10/31/2014.  :  Family History  Problem Relation Age of Onset   Cancer Father        lung   Heart attack Father 61   Alcohol abuse Father    Arthritis Father    COPD Father    Diabetes Father    Hypertension Father    Breast cancer Sister 3   Arthritis Sister    Cancer Sister    COPD Sister    Drug abuse Sister    Hypertension Sister    Miscarriages / [place] Sister    Thyroid disease Sister    Arthritis Mother    COPD Mother    Diabetes Mother    Hearing loss Mother    Hypertension Mother    Miscarriages / [place] Mother    Stroke Mother    Alcohol abuse Brother    Arthritis Brother    Hypertension Brother    Arthritis Daughter    Asthma Daughter    Depression Daughter    Hypertension Daughter    50 / [place] Daughter    Thyroid disease Daughter    Arthritis Maternal Grandmother    Hypertension Maternal Grandmother    Stroke Maternal Grandmother    Thyroid disease Maternal Grandmother    Alcohol abuse Maternal Grandfather    Arthritis Maternal Grandfather    Cancer Maternal Grandfather    Depression Maternal Grandfather    Hearing loss Maternal Grandfather    Hypertension Maternal Grandfather    Arthritis Sister    Thyroid disease Sister    Thyroid disease Daughter    Hypertension Daughter    Depression Daughter    COPD Daughter    Alcohol abuse Daughter    Arthritis Daughter    Arthritis Daughter    COPD Daughter    Depression Daughter    Hearing loss Daughter    Hypertension Daughter    Thyroid disease Daughter   :  Social History   Socioeconomic History   Marital status: Widowed    Spouse name: Not on file   Number of children: 3   Years of education: 2  associate degrees   Highest education level: Not on file  Occupational History   Not on file  Tobacco Use   Smoking status: Former Smoker    Packs/day: 1.00    Years: 40.00    Pack years: 40.00    Types: Cigarettes    Quit date: 2002    Years since quitting: 19.5   Smokeless tobacco: Never Used  Scientific laboratory technician Use: Never used  Substance and Sexual Activity   Alcohol use: Not Currently   Drug use: Never   Sexual activity: Not Currently  Other Topics Concern   Not on file  Social History Narrative   Moved from [place] with Daughter, [name]  (one daughter in [place] and the other in [place]) - 5 granddaughters and 2 great-grandchildren   Enjoys needle work, reading, cook   Exercise: not really, can walk 1/2 a mile at slow pace w/o getting SOB, SOB with stairs   Sleeps on the ground floor   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)   Social Determinants of Radio broadcast assistant Strain: Low Risk    Difficulty of Paying Living Expenses: Not hard at all  Food Insecurity: No Food Insecurity   Worried About Charity fundraiser in the Last Year: Never true   Arboriculturist in the Last Year: Never true  Transportation Needs: No Transportation Needs   Lack of Transportation (Medical): No   Lack of Transportation (Non-Medical): No  Physical Activity: Insufficiently Active   Days of Exercise per Week: 3 days   Minutes of Exercise per Session: 20 min  Stress: No Stress Concern Present   Feeling of Stress : Not at all  Social Connections:    Frequency of Communication with Friends and Family:    Frequency of Social Gatherings with Friends and Family:    Attends Religious Services:    Active Member of Clubs or Organizations:    Attends Music therapist:    Marital Status:   Intimate Partner Violence: Not At Risk   Fear of Current or Ex-Partner: No   Emotionally Abused: No   Physically Abused: No   Sexually Abused: No   :  Pertinent items are noted in HPI.  Exam: Blood pressure (!) 142/77, pulse 98, temperature 98.1 F (36.7 C), temperature source Temporal, resp. rate 20, height 4' 11.5" (1.511 m), weight 175 lb 6.4 oz (79.6 kg), SpO2 96 %.  ECOG 1 General appearance: alert and cooperative appeared without distress. Head: atraumatic without any abnormalities. Eyes: conjunctivae/corneas clear. PERRL.  Sclera anicteric. Throat: lips, mucosa, and tongue normal; without oral thrush or ulcers. Resp: clear to auscultation bilaterally without rhonchi, wheezes or dullness to percussion. Cardio: regular rate and rhythm, S1, S2 normal, no murmur, click, rub or gallop GI: soft, non-tender; bowel sounds normal; no masses,  no organomegaly Skin: Skin color, texture, turgor normal. No rashes or lesions Lymph nodes: Cervical, supraclavicular, and axillary nodes normal. Neurologic: Grossly normal without any motor, sensory or deep tendon reflexes. Musculoskeletal: No joint deformity or effusion.    Assessment and Plan:    79 year old woman with:  1. Elevated iron studies including total iron as well as ferritin detected on labs in June 2021. This is in the setting of elevated liver function tests including alkaline phosphatase, AST and ALT. Previous laboratory testing in December 2020 showed no specific abnormalities.  The differential diagnosis of these findings were reviewed. Hemochromatosis can be a possibility at this point which is resulting in her increase iron levels as well as decrease. This can in turn caused increased iron deposition in her liver and elevated liver function test.  I doubt this is related to a occult malignancy and metastatic disease to the liver but imaging of the liver would be reasonable at this time.  It is also possible that she is suffering from acute hepatitis which in turn can cause elevation in her iron levels. We  will work this up at this time I will obtain a hemochromatosis  panel and repeat iron studies in the near. Treatment for her hemochromatosis would require phlebotomy if this is documented to be the case.  We'll set up a follow-up after the results have been completed to give the final diagnosis and management plan.  2. Elevated transaminases: Unclear etiology. This could be related to hemochromatosis but could also be related to acute hepatitis. He is scheduled to have an ultrasound of the liver. I recommend GI hepatology evaluation to rule out other causes.   3. Follow-up: Will be in the near future to follow her progress and discuss laboratory testing.  45  minutes were dedicated to this visit. The time was spent on reviewing laboratory data, imaging studies, discussing treatment options, discussing differential diagnosis and answering questions regarding future plan.      A copy of this consult has been forwarded to the requesting physician.

## 2019-11-24 ENCOUNTER — Ambulatory Visit
Admission: RE | Admit: 2019-11-24 | Discharge: 2019-11-24 | Disposition: A | Discharge status: Home / Self Care | Payer: PPO | Source: Ambulatory Visit | Attending: Family Medicine | Admitting: Family Medicine

## 2019-11-24 DIAGNOSIS — R7989 Other specified abnormal findings of blood chemistry: Secondary | ICD-10-CM | POA: Diagnosis not present

## 2019-11-24 DIAGNOSIS — K824 Cholesterolosis of gallbladder: Secondary | ICD-10-CM | POA: Diagnosis not present

## 2019-11-24 LAB — HEMOCHROMATOSIS DNA-PCR(C282Y,H63D)

## 2019-11-25 ENCOUNTER — Other Ambulatory Visit: Payer: Self-pay | Admitting: Family Medicine

## 2019-11-25 ENCOUNTER — Telehealth: Payer: Self-pay | Admitting: Family Medicine

## 2019-11-25 DIAGNOSIS — R7989 Other specified abnormal findings of blood chemistry: Secondary | ICD-10-CM

## 2019-11-25 NOTE — Telephone Encounter (Signed)
Routing to Hematologist to see if they would like to reach out to patient about seeing Hepatology sooner to rule out other etiologies.   Pt declined referral to hepatology due to wanting to see hematology again prior to going.   Recent liver US results available - normal appearing liver.

## 2019-11-25 NOTE — Telephone Encounter (Signed)
Called patient regarding Urgent Hepatology  Referral. She doesn't want to go to this consult until after her Hematology appointment on 01/13/2020. Patient says she will call our  office after the appointment and will tell us if she wants to get the consult with Hepatology  or if she doesn't want the consult. Will Defer Referral until August 26th 2021

## 2020-01-07 ENCOUNTER — Other Ambulatory Visit: Payer: Self-pay

## 2020-01-07 ENCOUNTER — Inpatient Hospital Stay: Payer: PPO | Attending: Oncology

## 2020-01-07 DIAGNOSIS — R7401 Elevation of levels of liver transaminase levels: Secondary | ICD-10-CM | POA: Insufficient documentation

## 2020-01-07 DIAGNOSIS — R7989 Other specified abnormal findings of blood chemistry: Secondary | ICD-10-CM | POA: Insufficient documentation

## 2020-01-07 LAB — CMP (CANCER CENTER ONLY)
ALT: 74 U/L — ABNORMAL HIGH (ref 0–44)
AST: 37 U/L (ref 15–41)
Albumin: 3.2 g/dL — ABNORMAL LOW (ref 3.5–5.0)
Alkaline Phosphatase: 105 U/L (ref 38–126)
Anion gap: 8 (ref 5–15)
BUN: 15 mg/dL (ref 8–23)
CO2: 28 mmol/L (ref 22–32)
Calcium: 10.9 mg/dL — ABNORMAL HIGH (ref 8.9–10.3)
Chloride: 108 mmol/L (ref 98–111)
Creatinine: 0.84 mg/dL (ref 0.44–1.00)
GFR, Est AFR Am: >60 mL/min (ref >60)
GFR, Estimated: >60 mL/min (ref >60)
Glucose, Bld: 110 mg/dL — ABNORMAL HIGH (ref 70–99)
Potassium: 4 mmol/L (ref 3.5–5.1)
Sodium: 144 mmol/L (ref 135–145)
Total Bilirubin: 0.7 mg/dL (ref 0.3–1.2)
Total Protein: 6.3 g/dL — ABNORMAL LOW (ref 6.5–8.1)

## 2020-01-07 LAB — IRON AND TIBC
Iron: 152 ug/dL — ABNORMAL HIGH (ref 41–142)
Saturation Ratios: 55 % (ref 21–57)
TIBC: 275 ug/dL (ref 236–444)
UIBC: 123 ug/dL (ref 120–384)

## 2020-01-07 LAB — FERRITIN: Ferritin: 396 ng/mL — ABNORMAL HIGH (ref 11–307)

## 2020-01-10 ENCOUNTER — Other Ambulatory Visit: Payer: Self-pay

## 2020-01-10 ENCOUNTER — Inpatient Hospital Stay: Payer: PPO | Admitting: Oncology

## 2020-01-10 DIAGNOSIS — R7989 Other specified abnormal findings of blood chemistry: Secondary | ICD-10-CM | POA: Diagnosis not present

## 2020-01-10 NOTE — Progress Notes (Signed)
Hematology and Oncology Follow Up Visit  Nancy Merritt [id number] 1940/09/18 79 y.o. 01/10/2020 9:25 AM Nancy Merritt, MDCody, [name], MD   Principle Diagnosis: 79 year old woman with elevated iron and ferritin diagnosed in June 2021.  She was found to have iron of 182 and ferritin of 700 in the setting of elevated transaminases.  These findings appear to be reactive with work-up revealed no evidence of hemochromatosis.    Current therapy: No intervention needed.  Interim History: Nancy Merritt returns today for a follow-up visit.  Since the last visit, she reports no major changes in her health.  She denies any recent hospitalization or illnesses.  She denies any abdominal pain or excessive fatigue.  She has been taking multivitamins with increased iron supplements and has discontinued this at this time.     Medications: I have reviewed the patient's current medications.  Current Outpatient Medications  Medication Sig Dispense Refill   albuterol (PROVENTIL) (2.5 MG/3ML) 0.083% nebulizer solution Take 3 mLs (2.5 mg total) by nebulization 3 (three) times daily as needed. 75 mL 2   albuterol (VENTOLIN HFA) 108 (90 Base) MCG/ACT inhaler Inhale 2 puffs into the lungs every 4 (four) hours as needed for wheezing or shortness of breath. 6.7 g 3   Azelastine HCl 0.15 % SOLN Place 1 spray into the nose daily as needed. 30 mL 1   BREO ELLIPTA 200-25 MCG/INH AEPB TAKE 1 PUFF BY MOUTH EVERY DAY 60 each 5   Cholecalciferol (VITAMIN D-3) 125 MCG (5000 UT) TABS Take 1 tablet by mouth daily.     Flaxseed, Linseed, (FLAX SEEDS PO) Take 1 tablet by mouth daily.     ibuprofen (ADVIL,MOTRIN) 200 MG tablet Take 200 mg by mouth every 6 (six) hours as needed.     lisinopril (ZESTRIL) 20 MG tablet Take 1 tablet (20 mg total) by mouth in the morning and at bedtime. 180 tablet 2   loratadine (CLARITIN) 10 MG tablet Take 10 mg by mouth daily.     milk thistle 175 MG tablet Take 250 mg by mouth 3  (three) times daily.     Multiple Vitamins-Minerals (CENTRUM SILVER PO) Take 1 tablet by mouth daily.     NUTRITIONAL SUPPLEMENTS PO Take by mouth. Daily essentials for women- 1 packet a day     NUTRITIONAL SUPPLEMENTS PO Pro Nac- 1 capsule daily     NUTRITIONAL SUPPLEMENTS PO Hista support- 3 capsules a daily during allergy season     Probiotic Product (PROBIOTIC DAILY PO) Take 1 tablet by mouth daily.     SPIRIVA RESPIMAT 2.5 MCG/ACT AERS INHALE 2 PUFFS INTO THE LUNGS EVERY DAY 12 g 2   No current facility-administered medications for this visit.     Allergies:  Allergies  Allergen Reactions   Advair Diskus [Fluticasone-Salmeterol] Shortness Of Breath    As of 09/01/2014   Levofloxacin     Nausea, brain fog, depression, as of 12/12/14   Qvar [Beclomethasone]     Lungs closing-as of 05/03/2015   Singulair [Montelukast] Shortness Of Breath   Symbicort [Budesonide-Formoterol Fumarate]     Hard time breathing as of 10/31/2014.     Physical Exam: Blood pressure (!) 153/79, pulse 89, temperature 99.2 F (37.3 C), temperature source Tympanic, resp. rate 20, height 4' 11.5" (1.511 m), weight 171 lb (77.6 kg), SpO2 98 %. ECOG: 0   General appearance: Comfortable appearing without any discomfort Head: Normocephalic without any trauma Oropharynx: Mucous membranes are moist and pink without any thrush or  ulcers. Eyes: Pupils are equal and round reactive to light. Lymph nodes: No cervical, supraclavicular, inguinal or axillary lymphadenopathy.   Heart:regular rate and rhythm.  S1 and S2 without leg edema. Lung: Clear without any rhonchi or wheezes.  No dullness to percussion. Abdomin: Soft, nontender, nondistended with good bowel sounds.  No hepatosplenomegaly. Musculoskeletal: No joint deformity or effusion.  Full range of motion noted. Neurological: No deficits noted on motor, sensory and deep tendon reflex exam. Skin: No petechial rash or dryness.  Appeared moist.      Lab Results: Lab Results  Component Value Date   WBC 7.0 11/16/2019   HGB 13.9 11/16/2019   HCT 41.7 11/16/2019   MCV 90.1 11/16/2019   PLT 615.0 (H) 11/16/2019     Chemistry      Component Value Date/Time   NA 144 01/07/2020 1024   NA 140 01/06/2017 0000   K 4.0 01/07/2020 1024   CL 108 01/07/2020 1024   CO2 28 01/07/2020 1024   BUN 15 01/07/2020 1024   BUN 15 01/06/2017 0000   CREATININE 0.84 01/07/2020 1024   GLU 119 01/06/2017 0000      Component Value Date/Time   CALCIUM 10.9 (H) 01/07/2020 1024   CALCIUM 10.8 03/24/2017 0000   ALKPHOS 105 01/07/2020 1024   AST 37 01/07/2020 1024   ALT 74 (H) 01/07/2020 1024   BILITOT 0.7 01/07/2020 1024         Impression and Plan:   79 year old woman with:  1. Elevated serum iron and ferritin diagnosed in December 2020.  She was found to have ferritin up to 739.4 with iron of 182.   Interventional diagnosis was reviewed again and her laboratory data were updated.  Laboratory data obtained on August 20 showed iron level of 152 with a ferritin that is down to 396.  Her hemochromatosis panel was normal without any genetic mutation detected.  These findings are highly suggestive of an inflammatory reaction predominantly in the setting of her elevated liver function test.  Acute hepatitis might likely caused her elevation in iron and ferritin level which appears to be improving at this time.  I see no evidence to suggest need for phlebotomy or additional intervention.    2. Elevated transaminases: Her liver ultrasound is normal.  Her AST ALT are also normalizing.  It is possible that acute hepatitis might have resulted in an elevation in her AST and ALT and subsequently resulted in elevation in her ferritin.  I recommended the periodic monitoring under the care of her primary care physician and further the pathology evaluation if abnormalities are detected again in the future.   3. Follow-up: I am happy to see her  in the future as needed.  I see no need for any further hematological follow-up.    30  minutes were spent on this encounter.  The time was dedicated to reviewing imaging studies updating her laboratory data, reviewing differential diagnosis and answering questions regarding future plan of care.     Zola Button, MD 8/23/20219:25 AM

## 2020-01-11 ENCOUNTER — Telehealth: Payer: Self-pay | Admitting: Oncology

## 2020-01-11 NOTE — Telephone Encounter (Signed)
No appointments scheduled per 8/23 los. No check out notes provided.  

## 2020-02-22 ENCOUNTER — Ambulatory Visit (INDEPENDENT_AMBULATORY_CARE_PROVIDER_SITE_OTHER): Payer: PPO | Admitting: Family Medicine

## 2020-02-22 ENCOUNTER — Encounter: Payer: Self-pay | Admitting: Family Medicine

## 2020-02-22 ENCOUNTER — Other Ambulatory Visit: Payer: Self-pay

## 2020-02-22 VITALS — BP 122/80 | HR 100 | Temp 97.6°F | Wt 172.5 lb

## 2020-02-22 DIAGNOSIS — H6123 Impacted cerumen, bilateral: Secondary | ICD-10-CM

## 2020-02-22 DIAGNOSIS — J302 Other seasonal allergic rhinitis: Secondary | ICD-10-CM

## 2020-02-22 NOTE — Patient Instructions (Addendum)
#  Ear fullness - Try Flonase - nasal spray - over the counter - Continue daily allergy medication - OK to use pseudoephedrine as needed - You may get some drainage over the next day or so  - If still having fullness or difficulty hearing after you get your new hearing aids let me know and we can have you see the Ear specialist  Watch the moles on your skin and let me know if any change

## 2020-02-22 NOTE — Progress Notes (Signed)
   Subjective:     Nancy Merritt is a 79 y.o. female presenting for Ear Fullness     HPI  #Ear fullness - due for new hearing aides - has hearing loss - trouble with ear aches and stuffiness - increased sinus symptoms - tried pseudoephedrine due to pressure - eye watering and allergy symptoms as well    Review of Systems   Social History   Tobacco Use  Smoking Status Former Smoker  . Packs/day: 1.00  . Years: 40.00  . Pack years: 40.00  . Types: Cigarettes  . Quit date: 2002  . Years since quitting: 19.7  Smokeless Tobacco Never Used        Objective:    BP Readings from Last 3 Encounters:  02/22/20 122/80  01/10/20 (!) 153/79  11/19/19 (!) 142/77   Wt Readings from Last 3 Encounters:  02/22/20 172 lb 8 oz (78.2 kg)  01/10/20 171 lb (77.6 kg)  11/19/19 175 lb 6.4 oz (79.6 kg)    BP 122/80   Pulse 100   Temp 97.6 F (36.4 C) (Temporal)   Wt 172 lb 8 oz (78.2 kg)   SpO2 95%   BMI 34.26 kg/m    Physical Exam Constitutional:      General: She is not in acute distress.    Appearance: She is well-developed. She is not diaphoretic.  HENT:     Head:     Comments: Some improvement in left cerumen after cleaning. Unable to clear right.     Right Ear: External ear normal. There is impacted cerumen.     Left Ear: External ear normal. There is impacted cerumen.     Nose: Nose normal.  Eyes:     Conjunctiva/sclera: Conjunctivae normal.  Cardiovascular:     Rate and Rhythm: Normal rate.  Pulmonary:     Effort: Pulmonary effort is normal.  Musculoskeletal:     Cervical back: Neck supple.  Skin:    General: Skin is warm and dry.     Capillary Refill: Capillary refill takes less than 2 seconds.  Neurological:     Mental Status: She is alert. Mental status is at baseline.  Psychiatric:        Mood and Affect: Mood normal.        Behavior: Behavior normal.           Assessment & Plan:   Problem List Items Addressed This Visit    None      Visit Diagnoses    Bilateral impacted cerumen    -  Primary   Seasonal allergies         Attempted to clear ear wax w/o improvement. On further question she notes worsening allergies. Suspect some eustacian tube dysfunction and allergies - treat with flonase and allergy medication - if hearing aides replaced and still difficulty hearing return to clinic   Return if symptoms worsen or fail to improve.  Lesleigh Noe, MD  This visit occurred during the SARS-CoV-2 public health emergency.  Safety protocols were in place, including screening questions prior to the visit, additional usage of staff PPE, and extensive cleaning of exam room while observing appropriate contact time as indicated for disinfecting solutions.

## 2020-03-09 ENCOUNTER — Other Ambulatory Visit: Payer: Self-pay | Admitting: Family Medicine

## 2020-03-09 DIAGNOSIS — J4530 Mild persistent asthma, uncomplicated: Secondary | ICD-10-CM

## 2020-03-27 ENCOUNTER — Other Ambulatory Visit: Payer: Self-pay | Admitting: Family Medicine

## 2020-03-27 DIAGNOSIS — J4530 Mild persistent asthma, uncomplicated: Secondary | ICD-10-CM

## 2020-03-27 DIAGNOSIS — J441 Chronic obstructive pulmonary disease with (acute) exacerbation: Secondary | ICD-10-CM

## 2020-05-03 DIAGNOSIS — I6202 Nontraumatic subacute subdural hemorrhage: Secondary | ICD-10-CM | POA: Diagnosis not present

## 2020-05-03 DIAGNOSIS — Z951 Presence of aortocoronary bypass graft: Secondary | ICD-10-CM | POA: Diagnosis not present

## 2020-05-03 DIAGNOSIS — Z853 Personal history of malignant neoplasm of breast: Secondary | ICD-10-CM | POA: Diagnosis not present

## 2020-05-03 DIAGNOSIS — I679 Cerebrovascular disease, unspecified: Secondary | ICD-10-CM | POA: Diagnosis not present

## 2020-05-03 DIAGNOSIS — I2510 Atherosclerotic heart disease of native coronary artery without angina pectoris: Secondary | ICD-10-CM | POA: Diagnosis not present

## 2020-05-03 DIAGNOSIS — I6782 Cerebral ischemia: Secondary | ICD-10-CM | POA: Diagnosis not present

## 2020-05-03 DIAGNOSIS — R29700 NIHSS score 0: Secondary | ICD-10-CM | POA: Diagnosis not present

## 2020-05-03 DIAGNOSIS — I6529 Occlusion and stenosis of unspecified carotid artery: Secondary | ICD-10-CM | POA: Diagnosis not present

## 2020-05-03 DIAGNOSIS — Z881 Allergy status to other antibiotic agents status: Secondary | ICD-10-CM | POA: Diagnosis not present

## 2020-05-03 DIAGNOSIS — I6502 Occlusion and stenosis of left vertebral artery: Secondary | ICD-10-CM | POA: Diagnosis not present

## 2020-05-03 DIAGNOSIS — R4789 Other speech disturbances: Secondary | ICD-10-CM | POA: Diagnosis not present

## 2020-05-03 DIAGNOSIS — I639 Cerebral infarction, unspecified: Secondary | ICD-10-CM | POA: Diagnosis not present

## 2020-05-03 DIAGNOSIS — I10 Essential (primary) hypertension: Secondary | ICD-10-CM | POA: Diagnosis not present

## 2020-05-03 DIAGNOSIS — I6522 Occlusion and stenosis of left carotid artery: Secondary | ICD-10-CM | POA: Diagnosis not present

## 2020-05-03 DIAGNOSIS — J449 Chronic obstructive pulmonary disease, unspecified: Secondary | ICD-10-CM | POA: Diagnosis not present

## 2020-05-03 DIAGNOSIS — Z888 Allergy status to other drugs, medicaments and biological substances status: Secondary | ICD-10-CM | POA: Diagnosis not present

## 2020-05-03 DIAGNOSIS — Z7902 Long term (current) use of antithrombotics/antiplatelets: Secondary | ICD-10-CM | POA: Diagnosis not present

## 2020-05-03 DIAGNOSIS — Z20822 Contact with and (suspected) exposure to covid-19: Secondary | ICD-10-CM | POA: Diagnosis not present

## 2020-05-03 DIAGNOSIS — R4701 Aphasia: Secondary | ICD-10-CM | POA: Diagnosis not present

## 2020-05-04 DIAGNOSIS — Z881 Allergy status to other antibiotic agents status: Secondary | ICD-10-CM | POA: Diagnosis not present

## 2020-05-04 DIAGNOSIS — I2510 Atherosclerotic heart disease of native coronary artery without angina pectoris: Secondary | ICD-10-CM | POA: Diagnosis not present

## 2020-05-04 DIAGNOSIS — Z20822 Contact with and (suspected) exposure to covid-19: Secondary | ICD-10-CM | POA: Diagnosis not present

## 2020-05-04 DIAGNOSIS — Z853 Personal history of malignant neoplasm of breast: Secondary | ICD-10-CM | POA: Diagnosis not present

## 2020-05-04 DIAGNOSIS — I6529 Occlusion and stenosis of unspecified carotid artery: Secondary | ICD-10-CM | POA: Diagnosis not present

## 2020-05-04 DIAGNOSIS — Z7902 Long term (current) use of antithrombotics/antiplatelets: Secondary | ICD-10-CM | POA: Diagnosis not present

## 2020-05-04 DIAGNOSIS — I6522 Occlusion and stenosis of left carotid artery: Secondary | ICD-10-CM | POA: Diagnosis not present

## 2020-05-04 DIAGNOSIS — I6202 Nontraumatic subacute subdural hemorrhage: Secondary | ICD-10-CM | POA: Diagnosis not present

## 2020-05-04 DIAGNOSIS — I10 Essential (primary) hypertension: Secondary | ICD-10-CM | POA: Diagnosis not present

## 2020-05-04 DIAGNOSIS — I6782 Cerebral ischemia: Secondary | ICD-10-CM | POA: Diagnosis not present

## 2020-05-04 DIAGNOSIS — R4701 Aphasia: Secondary | ICD-10-CM | POA: Diagnosis not present

## 2020-05-04 DIAGNOSIS — I6502 Occlusion and stenosis of left vertebral artery: Secondary | ICD-10-CM | POA: Diagnosis not present

## 2020-05-04 DIAGNOSIS — I6509 Occlusion and stenosis of unspecified vertebral artery: Secondary | ICD-10-CM | POA: Diagnosis not present

## 2020-05-04 DIAGNOSIS — J449 Chronic obstructive pulmonary disease, unspecified: Secondary | ICD-10-CM | POA: Diagnosis not present

## 2020-05-04 DIAGNOSIS — Z888 Allergy status to other drugs, medicaments and biological substances status: Secondary | ICD-10-CM | POA: Diagnosis not present

## 2020-05-04 DIAGNOSIS — R29700 NIHSS score 0: Secondary | ICD-10-CM | POA: Diagnosis not present

## 2020-05-04 DIAGNOSIS — Z951 Presence of aortocoronary bypass graft: Secondary | ICD-10-CM | POA: Diagnosis not present

## 2020-05-04 DIAGNOSIS — R4789 Other speech disturbances: Secondary | ICD-10-CM | POA: Diagnosis not present

## 2020-05-04 DIAGNOSIS — I679 Cerebrovascular disease, unspecified: Secondary | ICD-10-CM | POA: Diagnosis not present

## 2020-05-04 DIAGNOSIS — I639 Cerebral infarction, unspecified: Secondary | ICD-10-CM | POA: Diagnosis not present

## 2020-05-18 ENCOUNTER — Other Ambulatory Visit: Payer: Self-pay

## 2020-05-18 ENCOUNTER — Ambulatory Visit (INDEPENDENT_AMBULATORY_CARE_PROVIDER_SITE_OTHER): Payer: PPO | Admitting: Family Medicine

## 2020-05-18 VITALS — BP 138/80 | HR 84 | Temp 98.2°F | Ht <= 58 in | Wt 168.4 lb

## 2020-05-18 DIAGNOSIS — N390 Urinary tract infection, site not specified: Secondary | ICD-10-CM | POA: Diagnosis not present

## 2020-05-18 DIAGNOSIS — J441 Chronic obstructive pulmonary disease with (acute) exacerbation: Secondary | ICD-10-CM | POA: Diagnosis not present

## 2020-05-18 DIAGNOSIS — I10 Essential (primary) hypertension: Secondary | ICD-10-CM | POA: Diagnosis not present

## 2020-05-18 DIAGNOSIS — I69328 Other speech and language deficits following cerebral infarction: Secondary | ICD-10-CM

## 2020-05-18 DIAGNOSIS — N3000 Acute cystitis without hematuria: Secondary | ICD-10-CM | POA: Diagnosis not present

## 2020-05-18 DIAGNOSIS — R319 Hematuria, unspecified: Secondary | ICD-10-CM | POA: Diagnosis not present

## 2020-05-18 DIAGNOSIS — Z8673 Personal history of transient ischemic attack (TIA), and cerebral infarction without residual deficits: Secondary | ICD-10-CM | POA: Insufficient documentation

## 2020-05-18 LAB — COMPREHENSIVE METABOLIC PANEL
ALT: 25 U/L (ref 0–35)
AST: 22 U/L (ref 0–37)
Albumin: 4 g/dL (ref 3.5–5.2)
Alkaline Phosphatase: 106 U/L (ref 39–117)
BUN: 15 mg/dL (ref 6–23)
CO2: 28 mEq/L (ref 19–32)
Calcium: 10.8 mg/dL — ABNORMAL HIGH (ref 8.4–10.5)
Chloride: 103 mEq/L (ref 96–112)
Creatinine, Ser: 0.77 mg/dL (ref 0.40–1.20)
GFR: 72.42 mL/min (ref >60.00)
Glucose, Bld: 105 mg/dL — ABNORMAL HIGH (ref 70–99)
Potassium: 4.5 mEq/L (ref 3.5–5.1)
Sodium: 137 mEq/L (ref 135–145)
Total Bilirubin: 0.5 mg/dL (ref 0.2–1.2)
Total Protein: 7 g/dL (ref 6.0–8.3)

## 2020-05-18 LAB — CBC
HCT: 45.6 % (ref 36.0–46.0)
Hemoglobin: 15 g/dL (ref 12.0–15.0)
MCHC: 32.8 g/dL (ref 30.0–36.0)
MCV: 90.3 fl (ref 78.0–100.0)
Platelets: 998 10*3/uL — ABNORMAL HIGH (ref 150.0–400.0)
RBC: 5.05 Mil/uL (ref 3.87–5.11)
RDW: 14.4 % (ref 11.5–15.5)
WBC: 16.3 10*3/uL — ABNORMAL HIGH (ref 4.0–10.5)

## 2020-05-18 LAB — POCT URINALYSIS DIP (MANUAL ENTRY)
Bilirubin, UA: NEGATIVE
Glucose, UA: NEGATIVE mg/dL
Nitrite, UA: POSITIVE — AB
Protein Ur, POC: 30 mg/dL — AB
Spec Grav, UA: 1.015 (ref 1.010–1.025)
Urobilinogen, UA: NEGATIVE E.U./dL — AB
pH, UA: 5.5 (ref 5.0–8.0)

## 2020-05-18 MED ORDER — CEPHALEXIN 500 MG PO CAPS: 500.0000 mg | ORAL_CAPSULE | Freq: Two times a day (BID) | ORAL | 0 refills | Status: AC

## 2020-05-18 MED ORDER — PREDNISONE 50 MG PO TABS: 50.0000 mg | ORAL_TABLET | Freq: Every day | ORAL | 0 refills | Status: AC

## 2020-05-18 NOTE — Patient Instructions (Addendum)
Urine infection  - Keflex 500 mg twice daily for 7 days - urine culture - just in case  #Referral I have placed a referral to a specialist for you. You should receive a phone call from the specialty office. Make sure your voicemail is not full and that if you are able to answer your phone to unknown or new numbers.   It may take up to 2 weeks to hear about the referral. If you do not hear anything in 2 weeks, please call our office and ask to speak with the referral coordinator.    #Speech language referral  Neurology   Continue the aspirin and the cholesterol medication

## 2020-05-18 NOTE — Assessment & Plan Note (Signed)
Word finding and some decision making difficulty. Will start with SLP referral and neurology referral to help with some memory/decision difficulty

## 2020-05-18 NOTE — Assessment & Plan Note (Signed)
Worsening cough/wheezing w/o increased sputum. Nebulizer for 1-2 days and start steroids if not improved

## 2020-05-18 NOTE — Progress Notes (Signed)
Subjective:     Nancy Merritt is a 79 y.o. female presenting for Hospitalization Follow-up (Stroke)     HPI  #Dysruia - cloudy urine - discomfort in urination - increased urgency - no frequency - no blood in the urine - no fever/chills, n/v, diarrhea  #Stoke - 05/03/2020 - daughter helps provide history - was on vacation  - 11:30 AM - she was not responding to questions and conversations - she said she was looking a needle work trying to figure out what to do. Then she started drooling and falling back in chair and "out of it"  - called 911 - she was recovering - first responders thought it was a TIA - since symptoms were improving but she had some forgetfulness  - was having word finding difficulty - stayed overnight - saw neurology and diagnosed with stroke - echo, mri, ct head  Persistent symptoms: when reading is noticing that her right eye keeps closing, difficulty making decisions, trouble finding words  Medication changes - started low dose asa and cholesterol medication   #Hip pain - has been taking ibuprofen for this   #COPD - planning ot use her albuterol today - daughter notes that she has not been breathing well for a while - has been using breo and spiriva - using rescue inhaler at least daily  Review of Systems   Social History   Tobacco Use  Smoking Status Former Smoker   Packs/day: 1.00   Years: 40.00   Pack years: 40.00   Types: Cigarettes   Quit date: 2002   Years since quitting: 20.0  Smokeless Tobacco Never Used        Objective:    BP Readings from Last 3 Encounters:  05/18/20 138/80  02/22/20 122/80  01/10/20 (!) 153/79   Wt Readings from Last 3 Encounters:  05/18/20 168 lb 6.4 oz (76.4 kg)  02/22/20 172 lb 8 oz (78.2 kg)  01/10/20 171 lb (77.6 kg)    BP 138/80 (BP Location: Right Arm, Patient Position: Sitting, Cuff Size: Normal)    Pulse 84    Temp 98.2 F (36.8 C) (Temporal)    Ht (!) 5.25" (0.133 m)    Wt  168 lb 6.4 oz (76.4 kg)    SpO2 94%    BMI 4295.62 kg/m    Physical Exam Constitutional:      General: She is not in acute distress.    Appearance: She is well-developed. She is not diaphoretic.  HENT:     Right Ear: External ear normal.     Left Ear: External ear normal.  Eyes:     Extraocular Movements: Extraocular movements intact.     Conjunctiva/sclera: Conjunctivae normal.  Cardiovascular:     Rate and Rhythm: Normal rate and regular rhythm.  Pulmonary:     Effort: Pulmonary effort is normal.     Breath sounds: Wheezing present.  Musculoskeletal:     Cervical back: Neck supple.  Skin:    General: Skin is warm and dry.     Capillary Refill: Capillary refill takes less than 2 seconds.  Neurological:     Mental Status: She is alert. Mental status is at baseline.  Psychiatric:        Mood and Affect: Mood normal.        Behavior: Behavior normal.     Urine with +LE and nitrites      Assessment & Plan:   Problem List Items Addressed This Visit  Cardiovascular and Mediastinum   Hypertension    BP at goal. Cont lisinopril 20 mg      Relevant Medications   atorvastatin (LIPITOR) 40 MG tablet   [facility] ASPIRIN ADULT LOW DOSE 81 MG chewable tablet   Other Relevant Orders   Comprehensive metabolic panel   CBC     Respiratory   COPD exacerbation (HCC)    Worsening cough/wheezing w/o increased sputum. 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 Will start with SLP referral and neurology referral to help with some memory/decision difficulty      Relevant Orders   Ambulatory referral to Speech Therapy   Ambulatory referral to Neurology   History of CVA (cerebrovascular accident) without residual deficits - Primary    Cont asa 81 mg and  atorvastatin 40 mg. Neurology referral. Request outside records.       Relevant Orders   Ambulatory referral to Speech Therapy   Ambulatory referral to Neurology    Other Visit Diagnoses    Urinary tract infection with hematuria, site unspecified       Relevant Medications   cephALEXin (KEFLEX) 500 MG capsule   Other Relevant Orders   POCT urinalysis dipstick (Completed)   Acute cystitis without hematuria       Relevant Medications   cephALEXin (KEFLEX) 500 MG capsule     Will treat for UTI and send for culture   Return in about 2 months (around 07/17/2020).  Lesleigh Noe, MD  This visit occurred during the SARS-CoV-2 public health emergency.  Safety protocols were in place, including screening questions prior to the visit, additional usage of staff PPE, and extensive cleaning of exam room while observing appropriate contact time as indicated for disinfecting solutions.

## 2020-05-18 NOTE — Assessment & Plan Note (Signed)
Cont asa 81 mg and atorvastatin 40 mg. Neurology referral. Request outside records.

## 2020-05-18 NOTE — Addendum Note (Signed)
Addended by: Trudie Reed A on: 05/18/2020 01:03 PM   Modules accepted: Orders

## 2020-05-18 NOTE — Assessment & Plan Note (Signed)
BP at goal. Cont lisinopril 20 mg

## 2020-05-19 LAB — URINE CULTURE
MICRO NUMBER:: 11370513
SPECIMEN QUALITY:: ADEQUATE

## 2020-05-24 ENCOUNTER — Telehealth: Payer: Self-pay | Admitting: Family Medicine

## 2020-05-24 DIAGNOSIS — Z8673 Personal history of transient ischemic attack (TIA), and cerebral infarction without residual deficits: Secondary | ICD-10-CM

## 2020-05-24 NOTE — Telephone Encounter (Signed)
Left voicemail for pt to call back.   If she does please relay the following  Reviewed outside records with recommendation for cardiology follow-up.   Please see if she already sees cardiology. If not will recommend referral to cardiology.

## 2020-05-24 NOTE — Telephone Encounter (Signed)
Referral sent to CVD office on [facility], and faxed outside hospital records to them also.

## 2020-05-24 NOTE — Telephone Encounter (Signed)
She currently is not seeing a cardiologist and would appreciate a referral to one. Thank you, EM

## 2020-05-24 NOTE — Telephone Encounter (Signed)
Referral placed. Routing to referral coordinator so that records from hospital stay can be sent

## 2020-05-24 NOTE — Addendum Note (Signed)
Addended by: Lynnda Child on: 05/24/2020 03:13 PM   Modules accepted: Orders

## 2020-05-25 ENCOUNTER — Other Ambulatory Visit: Payer: Self-pay | Admitting: Family Medicine

## 2020-05-25 DIAGNOSIS — I10 Essential (primary) hypertension: Secondary | ICD-10-CM

## 2020-05-31 ENCOUNTER — Other Ambulatory Visit: Payer: Self-pay

## 2020-05-31 ENCOUNTER — Telehealth: Payer: Self-pay

## 2020-05-31 DIAGNOSIS — J4530 Mild persistent asthma, uncomplicated: Secondary | ICD-10-CM

## 2020-05-31 MED ORDER — ALBUTEROL SULFATE HFA 108 (90 BASE) MCG/ACT IN AERS: 2.0000 | INHALATION_SPRAY | RESPIRATORY_TRACT | 3 refills | Status: DC | PRN

## 2020-05-31 MED ORDER — ATORVASTATIN CALCIUM 40 MG PO TABS: 40.0000 mg | ORAL_TABLET | Freq: Every day | ORAL | 1 refills | Status: DC

## 2020-05-31 NOTE — Telephone Encounter (Signed)
Patient came into the office needing a refill on medications   atorvastatin (LIPITOR) 40 MG tablet  albuterol (VENTOLIN HFA) 108 (90 Base) MCG/ACT inhaler . [id number] [place], [place] - [address]

## 2020-06-14 ENCOUNTER — Encounter: Payer: Self-pay | Admitting: Family Medicine

## 2020-06-14 ENCOUNTER — Other Ambulatory Visit: Payer: PPO

## 2020-06-14 ENCOUNTER — Telehealth (INDEPENDENT_AMBULATORY_CARE_PROVIDER_SITE_OTHER): Payer: PPO | Admitting: Family Medicine

## 2020-06-14 ENCOUNTER — Other Ambulatory Visit: Payer: Self-pay

## 2020-06-14 ENCOUNTER — Telehealth: Payer: Self-pay | Admitting: *Deleted

## 2020-06-14 VITALS — Temp 98.9°F | Ht 62.5 in

## 2020-06-14 DIAGNOSIS — R300 Dysuria: Secondary | ICD-10-CM | POA: Diagnosis not present

## 2020-06-14 DIAGNOSIS — N3090 Cystitis, unspecified without hematuria: Secondary | ICD-10-CM

## 2020-06-14 DIAGNOSIS — U071 COVID-19: Secondary | ICD-10-CM

## 2020-06-14 HISTORY — DX: COVID-19: U07.1

## 2020-06-14 LAB — POC URINALSYSI DIPSTICK (AUTOMATED)
Bilirubin, UA: NEGATIVE
Glucose, UA: NEGATIVE
Ketones, UA: NEGATIVE
Nitrite, UA: NEGATIVE
Protein, UA: POSITIVE — AB
Spec Grav, UA: 1.015 (ref 1.010–1.025)
Urobilinogen, UA: 1 E.U./dL
pH, UA: 6.5 (ref 5.0–8.0)

## 2020-06-14 MED ORDER — SULFAMETHOXAZOLE-TRIMETHOPRIM 800-160 MG PO TABS: 1.0000 | ORAL_TABLET | Freq: Two times a day (BID) | ORAL | 0 refills | Status: AC

## 2020-06-14 NOTE — Telephone Encounter (Signed)
Patient called stating that she has another UTI and it is worse this time. Patient stated that she is having pain with urination. Patient complaints of burning, urgency and frequency. Patient requested medication. Patient was scheduled for a virtual visit with Dr. Lorelei Pont today. Patient's daughter will drop off a urine specimen before the appointment. ER precautions were given to patient's daughter and she verbalized understanding.

## 2020-06-14 NOTE — Telephone Encounter (Signed)
Noted  

## 2020-06-14 NOTE — Progress Notes (Signed)
      Nancy Merritt T. Nancy Apollo, MD Primary Care and Sports Medicine [facility] [place], [address] Phone: [phone]  FAX: [phone]  Nancy Merritt - 80 y.o. female  MRN [id number]  Date of Birth: [birth year]  Visit Date: 06/14/2020  PCP: Lesleigh Noe, MD  Referred by: Lesleigh Noe, MD  Virtual Visit via Video Note:  I connected with  Nancy Merritt on 06/14/2020  3:40 PM EST by a video enabled telemedicine application and verified that I am speaking with the correct person using two identifiers.   Location patient: home computer, tablet, or smartphone Location provider: work or home office Consent: Verbal consent directly obtained from [name]. Persons participating in the virtual visit: patient, provider  I discussed the limitations of evaluation and management by telemedicine and the availability of in person appointments. The patient expressed understanding and agreed to proceed.  Chief Complaint  Patient presents with  . Dysuria    With urinary urgency/frequency     History of Present Illness:  Not frequently having UTI's  She was treated several weeks ago for a UTI, culture was contaminated, she was treated with Keflex 500 mg p.o. twice daily.  She has had a recurrence of some symptoms.  Her daughter does provide additional history.  Currently she is having some significant dysuria as well as urgency.   Review of Systems as above: See pertinent positives and pertinent negatives per HPI No acute distress verbally   Observations/Objective/Exam:  An attempt was made to discern vital signs over the phone and per patient if applicable and possible.   General:    Alert, Oriented, appears well and in no acute distress  Pulmonary:     On inspection no signs of respiratory distress.  Psych / Neurological:     Pleasant and cooperative.  Assessment and Plan:    ICD-10-CM   1. Cystitis  N30.90    2. Dysuria  R30.0 POCT Urinalysis Dipstick (Automated)    Urine Culture   Start Septra, await culture.  I discussed the assessment and treatment plan with the patient. The patient was provided an opportunity to ask questions and all were answered. The patient agreed with the plan and demonstrated an understanding of the instructions.   The patient was advised to call back or seek an in-person evaluation if the symptoms worsen or if the condition fails to improve as anticipated.  Follow-up: prn unless noted otherwise below No follow-ups on file.  Meds ordered this encounter  Medications  . sulfamethoxazole-trimethoprim (BACTRIM DS) 800-160 MG tablet    Sig: Take 1 tablet by mouth 2 (two) times daily for 7 days.    Dispense:  14 tablet    Refill:  0   Orders Placed This Encounter  Procedures  . Urine Culture  . POCT Urinalysis Dipstick (Automated)    Signed,  Dawnielle Christiana T. Gloriajean Okun, MD

## 2020-06-15 ENCOUNTER — Emergency Department (HOSPITAL_COMMUNITY): Payer: PPO

## 2020-06-15 ENCOUNTER — Other Ambulatory Visit: Payer: Self-pay

## 2020-06-15 ENCOUNTER — Inpatient Hospital Stay (HOSPITAL_COMMUNITY)
Admission: EM | Admit: 2020-06-15 | Discharge: 2020-06-20 | DRG: 177 | Disposition: A | Discharge status: Home / Self Care | Payer: PPO | Attending: Internal Medicine | Admitting: Internal Medicine

## 2020-06-15 ENCOUNTER — Telehealth: Payer: Self-pay

## 2020-06-15 DIAGNOSIS — U071 COVID-19: Principal | ICD-10-CM | POA: Diagnosis present

## 2020-06-15 DIAGNOSIS — J440 Chronic obstructive pulmonary disease with acute lower respiratory infection: Secondary | ICD-10-CM | POA: Diagnosis present

## 2020-06-15 DIAGNOSIS — Z87891 Personal history of nicotine dependence: Secondary | ICD-10-CM

## 2020-06-15 DIAGNOSIS — Z813 Family history of other psychoactive substance abuse and dependence: Secondary | ICD-10-CM | POA: Diagnosis not present

## 2020-06-15 DIAGNOSIS — Z66 Do not resuscitate: Secondary | ICD-10-CM | POA: Diagnosis present

## 2020-06-15 DIAGNOSIS — F32A Depression, unspecified: Secondary | ICD-10-CM | POA: Diagnosis present

## 2020-06-15 DIAGNOSIS — Z888 Allergy status to other drugs, medicaments and biological substances status: Secondary | ICD-10-CM

## 2020-06-15 DIAGNOSIS — Z833 Family history of diabetes mellitus: Secondary | ICD-10-CM

## 2020-06-15 DIAGNOSIS — Z8673 Personal history of transient ischemic attack (TIA), and cerebral infarction without residual deficits: Secondary | ICD-10-CM | POA: Diagnosis not present

## 2020-06-15 DIAGNOSIS — Z951 Presence of aortocoronary bypass graft: Secondary | ICD-10-CM | POA: Diagnosis not present

## 2020-06-15 DIAGNOSIS — J411 Mucopurulent chronic bronchitis: Secondary | ICD-10-CM | POA: Diagnosis not present

## 2020-06-15 DIAGNOSIS — Z96641 Presence of right artificial hip joint: Secondary | ICD-10-CM | POA: Diagnosis present

## 2020-06-15 DIAGNOSIS — Z853 Personal history of malignant neoplasm of breast: Secondary | ICD-10-CM | POA: Diagnosis not present

## 2020-06-15 DIAGNOSIS — I2510 Atherosclerotic heart disease of native coronary artery without angina pectoris: Secondary | ICD-10-CM | POA: Diagnosis present

## 2020-06-15 DIAGNOSIS — Z8349 Family history of other endocrine, nutritional and metabolic diseases: Secondary | ICD-10-CM | POA: Diagnosis not present

## 2020-06-15 DIAGNOSIS — Z7982 Long term (current) use of aspirin: Secondary | ICD-10-CM

## 2020-06-15 DIAGNOSIS — J1282 Pneumonia due to coronavirus disease 2019: Secondary | ICD-10-CM | POA: Diagnosis present

## 2020-06-15 DIAGNOSIS — I10 Essential (primary) hypertension: Secondary | ICD-10-CM | POA: Diagnosis present

## 2020-06-15 DIAGNOSIS — Z823 Family history of stroke: Secondary | ICD-10-CM | POA: Diagnosis not present

## 2020-06-15 DIAGNOSIS — J449 Chronic obstructive pulmonary disease, unspecified: Secondary | ICD-10-CM | POA: Diagnosis present

## 2020-06-15 DIAGNOSIS — Z811 Family history of alcohol abuse and dependence: Secondary | ICD-10-CM | POA: Diagnosis not present

## 2020-06-15 DIAGNOSIS — Z803 Family history of malignant neoplasm of breast: Secondary | ICD-10-CM

## 2020-06-15 DIAGNOSIS — Z825 Family history of asthma and other chronic lower respiratory diseases: Secondary | ICD-10-CM | POA: Diagnosis not present

## 2020-06-15 DIAGNOSIS — R0602 Shortness of breath: Secondary | ICD-10-CM | POA: Diagnosis not present

## 2020-06-15 DIAGNOSIS — R000 Tachycardia, unspecified: Secondary | ICD-10-CM | POA: Diagnosis not present

## 2020-06-15 DIAGNOSIS — J9811 Atelectasis: Secondary | ICD-10-CM | POA: Diagnosis not present

## 2020-06-15 DIAGNOSIS — Z8249 Family history of ischemic heart disease and other diseases of the circulatory system: Secondary | ICD-10-CM

## 2020-06-15 DIAGNOSIS — Z7951 Long term (current) use of inhaled steroids: Secondary | ICD-10-CM

## 2020-06-15 DIAGNOSIS — J9601 Acute respiratory failure with hypoxia: Secondary | ICD-10-CM | POA: Diagnosis present

## 2020-06-15 DIAGNOSIS — Z79899 Other long term (current) drug therapy: Secondary | ICD-10-CM

## 2020-06-15 DIAGNOSIS — Z8261 Family history of arthritis: Secondary | ICD-10-CM

## 2020-06-15 DIAGNOSIS — Z818 Family history of other mental and behavioral disorders: Secondary | ICD-10-CM

## 2020-06-15 LAB — BASIC METABOLIC PANEL
Anion gap: 11 (ref 5–15)
BUN: 9 mg/dL (ref 8–23)
CO2: 22 mmol/L (ref 22–32)
Calcium: 9.8 mg/dL (ref 8.9–10.3)
Chloride: 105 mmol/L (ref 98–111)
Creatinine, Ser: 0.92 mg/dL (ref 0.44–1.00)
GFR, Estimated: >60 mL/min (ref >60)
Glucose, Bld: 132 mg/dL — ABNORMAL HIGH (ref 70–99)
Potassium: 3.7 mmol/L (ref 3.5–5.1)
Sodium: 138 mmol/L (ref 135–145)

## 2020-06-15 LAB — FERRITIN: Ferritin: 656 ng/mL — ABNORMAL HIGH (ref 11–307)

## 2020-06-15 LAB — CBC
HCT: 45.6 % (ref 36.0–46.0)
Hemoglobin: 15.6 g/dL — ABNORMAL HIGH (ref 12.0–15.0)
MCH: 30.5 pg (ref 26.0–34.0)
MCHC: 34.2 g/dL (ref 30.0–36.0)
MCV: 89.2 fL (ref 80.0–100.0)
Platelets: 544 10*3/uL — ABNORMAL HIGH (ref 150–400)
RBC: 5.11 MIL/uL (ref 3.87–5.11)
RDW: 14.4 % (ref 11.5–15.5)
WBC: 8.2 10*3/uL (ref 4.0–10.5)
nRBC: 0 % (ref 0.0–0.2)

## 2020-06-15 LAB — FIBRINOGEN: Fibrinogen: 362 mg/dL (ref 210–475)

## 2020-06-15 LAB — D-DIMER, QUANTITATIVE: D-Dimer, Quant: 1.01 ug/mL-FEU — ABNORMAL HIGH (ref 0.00–0.50)

## 2020-06-15 LAB — LACTATE DEHYDROGENASE: LDH: 246 U/L — ABNORMAL HIGH (ref 98–192)

## 2020-06-15 LAB — PROCALCITONIN: Procalcitonin: <0.1 ng/mL

## 2020-06-15 LAB — SARS CORONAVIRUS 2 BY RT PCR (HOSPITAL ORDER, PERFORMED IN ~~LOC~~ HOSPITAL LAB): SARS Coronavirus 2: POSITIVE — AB

## 2020-06-15 LAB — TROPONIN I (HIGH SENSITIVITY): Troponin I (High Sensitivity): 6 ng/L (ref <18)

## 2020-06-15 LAB — BRAIN NATRIURETIC PEPTIDE: B Natriuretic Peptide: 47.5 pg/mL (ref 0.0–100.0)

## 2020-06-15 MED ORDER — ATORVASTATIN CALCIUM 40 MG PO TABS
40.0000 mg | ORAL_TABLET | Freq: Every day | ORAL | Status: DC
  Administered 2020-06-16 – 2020-06-20 (×5): 40 mg via ORAL
  Filled 2020-06-15 (×4): qty 1

## 2020-06-15 MED ORDER — ACETAMINOPHEN 325 MG PO TABS
650.0000 mg | ORAL_TABLET | Freq: Four times a day (QID) | ORAL | Status: DC | PRN
  Administered 2020-06-19 – 2020-06-20 (×4): 650 mg via ORAL
  Filled 2020-06-15 (×4): qty 2

## 2020-06-15 MED ORDER — UMECLIDINIUM BROMIDE 62.5 MCG/INH IN AEPB
1.0000 | INHALATION_SPRAY | Freq: Every day | RESPIRATORY_TRACT | Status: DC
  Administered 2020-06-16 – 2020-06-20 (×5): 1 via RESPIRATORY_TRACT
  Filled 2020-06-15: qty 7

## 2020-06-15 MED ORDER — ENOXAPARIN SODIUM 40 MG/0.4ML ~~LOC~~ SOLN
40.0000 mg | SUBCUTANEOUS | Status: DC
  Administered 2020-06-15 – 2020-06-20 (×6): 40 mg via SUBCUTANEOUS
  Filled 2020-06-15 (×7): qty 0.4

## 2020-06-15 MED ORDER — ASPIRIN 81 MG PO CHEW
81.0000 mg | CHEWABLE_TABLET | Freq: Every day | ORAL | Status: DC
  Administered 2020-06-16 – 2020-06-20 (×5): 81 mg via ORAL
  Filled 2020-06-15 (×5): qty 1

## 2020-06-15 MED ORDER — POLYETHYLENE GLYCOL 3350 17 G PO PACK
17.0000 g | PACK | Freq: Every day | ORAL | Status: DC | PRN
  Administered 2020-06-19: 17 g via ORAL
  Filled 2020-06-15: qty 1

## 2020-06-15 MED ORDER — SODIUM CHLORIDE 0.9 % IV SOLN
200.0000 mg | Freq: Once | INTRAVENOUS | Status: AC
  Administered 2020-06-15: 200 mg via INTRAVENOUS
  Filled 2020-06-15: qty 200

## 2020-06-15 MED ORDER — IPRATROPIUM-ALBUTEROL 20-100 MCG/ACT IN AERS
1.0000 | INHALATION_SPRAY | Freq: Four times a day (QID) | RESPIRATORY_TRACT | Status: DC | PRN
  Filled 2020-06-15: qty 4

## 2020-06-15 MED ORDER — DEXAMETHASONE SODIUM PHOSPHATE 10 MG/ML IJ SOLN
6.0000 mg | Freq: Once | INTRAMUSCULAR | Status: AC
  Administered 2020-06-15: 6 mg via INTRAVENOUS
  Filled 2020-06-15: qty 1

## 2020-06-15 MED ORDER — LISINOPRIL 20 MG PO TABS
20.0000 mg | ORAL_TABLET | Freq: Every day | ORAL | Status: DC
  Administered 2020-06-16 – 2020-06-20 (×5): 20 mg via ORAL
  Filled 2020-06-15 (×5): qty 1

## 2020-06-15 MED ORDER — ZINC SULFATE 220 (50 ZN) MG PO CAPS
220.0000 mg | ORAL_CAPSULE | Freq: Every day | ORAL | Status: DC
  Administered 2020-06-15 – 2020-06-20 (×6): 220 mg via ORAL
  Filled 2020-06-15 (×6): qty 1

## 2020-06-15 MED ORDER — GUAIFENESIN-DM 100-10 MG/5ML PO SYRP: 10.0000 mL | ORAL_SOLUTION | ORAL | Status: DC | PRN

## 2020-06-15 MED ORDER — HYDROCOD POLST-CPM POLST ER 10-8 MG/5ML PO SUER: 5.0000 mL | Freq: Two times a day (BID) | ORAL | Status: DC | PRN

## 2020-06-15 MED ORDER — SODIUM CHLORIDE 0.9 % IV SOLN
100.0000 mg | Freq: Every day | INTRAVENOUS | Status: AC
  Administered 2020-06-16 – 2020-06-19 (×4): 100 mg via INTRAVENOUS
  Filled 2020-06-15 (×4): qty 20

## 2020-06-15 MED ORDER — SODIUM CHLORIDE 0.9% FLUSH
3.0000 mL | Freq: Two times a day (BID) | INTRAVENOUS | Status: DC
  Administered 2020-06-16 – 2020-06-20 (×9): 3 mL via INTRAVENOUS

## 2020-06-15 MED ORDER — DEXAMETHASONE SODIUM PHOSPHATE 10 MG/ML IJ SOLN
6.0000 mg | INTRAMUSCULAR | Status: DC
Start: 2020-06-16 — End: 2020-06-21
  Administered 2020-06-16 – 2020-06-20 (×5): 6 mg via INTRAVENOUS
  Filled 2020-06-15 (×5): qty 1

## 2020-06-15 MED ORDER — ASCORBIC ACID 500 MG PO TABS
500.0000 mg | ORAL_TABLET | Freq: Every day | ORAL | Status: DC
  Administered 2020-06-15 – 2020-06-20 (×6): 500 mg via ORAL
  Filled 2020-06-15 (×6): qty 1

## 2020-06-15 MED ORDER — ALBUTEROL SULFATE HFA 108 (90 BASE) MCG/ACT IN AERS
2.0000 | INHALATION_SPRAY | RESPIRATORY_TRACT | Status: DC | PRN
  Administered 2020-06-16: 2 via RESPIRATORY_TRACT

## 2020-06-15 MED ORDER — ALBUTEROL SULFATE HFA 108 (90 BASE) MCG/ACT IN AERS
2.0000 | INHALATION_SPRAY | Freq: Once | RESPIRATORY_TRACT | Status: AC
  Administered 2020-06-15: 2 via RESPIRATORY_TRACT
  Filled 2020-06-15: qty 6.7

## 2020-06-15 MED ORDER — FLUTICASONE FUROATE-VILANTEROL 200-25 MCG/INH IN AEPB
1.0000 | INHALATION_SPRAY | Freq: Every day | RESPIRATORY_TRACT | Status: DC
  Administered 2020-06-16 – 2020-06-20 (×5): 1 via RESPIRATORY_TRACT
  Filled 2020-06-15: qty 28

## 2020-06-15 NOTE — H&P (Signed)
History and Physical   Nancy Merritt [id number] DOB: Feb 26, 1941 DOA: 06/15/2020  PCP: Lesleigh Noe, MD   Patient coming from: Home  Chief Complaint: Shortness of breath, orthopnea, edema  HPI: Nancy Merritt is a 80 y.o. female with medical history significant of CAD status post CABG x4, COPD, depression, CVA , breast cancer, hypertension who presents with about a week of shortness of breath, orthopnea.  She states she has had about a month of increased leg swelling, 2 weeks of fatigue and 1 week of shortness of breath as above. She was checking her home pulse ox back on the recommendation of her doctor and found that she had desatted to 85% at home.  She called her doctor who recommended that she go to the ED to be evaluated.  Patient continued to have saturations in the mid to upper 80s which improved with 2 L of oxygen.  Of note, Patient is unvaccinated and her daughter was positive for Covid 2 weeks ago.  She lives with her daughter. Patient denies fevers, chills, chest pain, dental pain, constipation, diarrhea, nausea, vomiting.  ED Course: Vital signs in the ED significant for requiring 2 L to maintain saturations, tachypnea in the 20s, heart rate in the 90s to low 100s.  Lab work-up showed BMP with glucose 132.  CBC with hemoglobin stable 15.6 and platelets stable at 544.  BMP normal.  Troponin pending.  Respiratory panel for flu and Covid positive for Covid.  Imaging showed chest x-ray with patchy right lung infiltrates.  Patient started on steroids and remdesivir in ED.  Review of Systems: As per HPI otherwise all other systems reviewed and are negative.  Past Medical History:  Diagnosis Date  . Arthritis   . Asthma   . Cancer ([place]) 1990  . Depression     Past Surgical History:  Procedure Laterality Date  . BREAST SURGERY Left 1990  . CORONARY ARTERY BYPASS GRAFT    . JOINT REPLACEMENT Right    Hip  . OVARIAN CYST REMOVAL  1970  . TUBAL LIGATION  1970    Social  History  reports that she quit smoking about 20 years ago. Her smoking use included cigarettes. She has a 40.00 pack-year smoking history. She has never used smokeless tobacco. She reports previous alcohol use. She reports that she does not use drugs.  Allergies  Allergen Reactions  . Advair Diskus [Fluticasone-Salmeterol] Shortness Of Breath    As of 09/01/2014  . Levofloxacin     Nausea, brain fog, depression, as of 12/12/14  . Qvar [Beclomethasone]     Lungs closing-as of 05/03/2015  . Singulair [Montelukast] Shortness Of Breath  . Symbicort [Budesonide-Formoterol Fumarate]     Hard time breathing as of 10/31/2014.    Family History  Problem Relation Age of Onset  . Cancer Father        lung  . Heart attack Father 18  . Alcohol abuse Father   . Arthritis Father   . COPD Father   . Diabetes Father   . Hypertension Father   . Breast cancer Sister 20  . Arthritis Sister   . Cancer Sister   . COPD Sister   . Drug abuse Sister   . Hypertension Sister   . Miscarriages / Stillbirths Sister   . Thyroid disease Sister   . Arthritis Mother   . COPD Mother   . Diabetes Mother   . Hearing loss Mother   . Hypertension Mother   . Miscarriages / [place]  Mother   . Stroke Mother   . Alcohol abuse Brother   . Arthritis Brother   . Hypertension Brother   . Arthritis Daughter   . Asthma Daughter   . Depression Daughter   . Hypertension Daughter   . Miscarriages / [place] Daughter   . Thyroid disease Daughter   . Arthritis Maternal Grandmother   . Hypertension Maternal Grandmother   . Stroke Maternal Grandmother   . Thyroid disease Maternal Grandmother   . Alcohol abuse Maternal Grandfather   . Arthritis Maternal Grandfather   . Cancer Maternal Grandfather   . Depression Maternal Grandfather   . Hearing loss Maternal Grandfather   . Hypertension Maternal Grandfather   . Arthritis Sister   . Thyroid disease Sister   . Thyroid disease Daughter   . Hypertension  Daughter   . Depression Daughter   . COPD Daughter   . Alcohol abuse Daughter   . Arthritis Daughter   . Arthritis Daughter   . COPD Daughter   . Depression Daughter   . Hearing loss Daughter   . Hypertension Daughter   . Thyroid disease Daughter   Reviewed on admission  Prior to Admission medications   Medication Sig Start Date End Date Taking? Authorizing Provider  albuterol (PROVENTIL) (2.5 MG/3ML) 0.083% nebulizer solution INHALE 1 VIAL VIA NEBULIZER 3 TIMES A DAY AS NEEDED 03/27/20   Lesleigh Noe, MD  albuterol (VENTOLIN HFA) 108 (90 Base) MCG/ACT inhaler Inhale 2 puffs into the lungs every 4 (four) hours as needed for wheezing or shortness of breath. 05/31/20   Lesleigh Noe, MD  atorvastatin (LIPITOR) 40 MG tablet Take 1 tablet (40 mg total) by mouth daily. 05/31/20   Lesleigh Noe, MD  Azelastine HCl 0.15 % SOLN Place 1 spray into the nose daily as needed. 04/07/18   Lesleigh Noe, MD  BREO ELLIPTA 200-25 MCG/INH AEPB INHALE 1 PUFF BY MOUTH EVERY DAY 03/09/20   Lesleigh Noe, MD  [facility]S ASPIRIN ADULT LOW DOSE 81 MG chewable tablet Chew 81 mg by mouth daily. 05/04/20   [provider]  ibuprofen (ADVIL,MOTRIN) 200 MG tablet Take 200 mg by mouth every 6 (six) hours as needed.    [provider]  lisinopril (ZESTRIL) 20 MG tablet TAKE 1 TABLET (20 MG TOTAL) BY MOUTH IN THE MORNING AND AT BEDTIME. 05/25/20   Lesleigh Noe, MD  loratadine (CLARITIN) 10 MG tablet Take 10 mg by mouth daily.    [provider]  SPIRIVA RESPIMAT 2.5 MCG/ACT AERS INHALE 2 PUFFS INTO THE LUNGS EVERY DAY 05/29/19   Lesleigh Noe, MD  sulfamethoxazole-trimethoprim (BACTRIM DS) 800-160 MG tablet Take 1 tablet by mouth 2 (two) times daily for 7 days. 06/14/20 06/21/20  Owens Loffler, MD    Physical Exam: Vitals:   06/15/20 1815 06/15/20 1830 06/15/20 1845 06/15/20 1849  BP: 122/77 123/73 126/70   Pulse: 91 94 95   Resp: (!) 23 19 (!) 27   Temp:      TempSrc:      SpO2: 92%  91% 92% 92%  Height:       Physical Exam Constitutional:      General: She is not in acute distress.    Appearance: Normal appearance.  HENT:     Head: Normocephalic and atraumatic.     Mouth/Throat:     Mouth: Mucous membranes are moist.     Pharynx: Oropharynx is clear.  Eyes:     Extraocular Movements: Extraocular  movements intact.     Pupils: Pupils are equal, round, and reactive to light.  Cardiovascular:     Rate and Rhythm: Normal rate and regular rhythm.     Pulses: Normal pulses.     Heart sounds: Normal heart sounds.  Pulmonary:     Effort: Pulmonary effort is normal. No respiratory distress.     Breath sounds: Rales (Trace) present.  Abdominal:     General: Bowel sounds are normal. There is no distension.     Palpations: Abdomen is soft.     Tenderness: There is no abdominal tenderness.  Musculoskeletal:        General: No swelling or deformity.     Right lower leg: No edema.     Left lower leg: No edema.  Skin:    General: Skin is warm and dry.  Neurological:     General: No focal deficit present.     Mental Status: Mental status is at baseline.    Labs on Admission: I have personally reviewed following labs and imaging studies  CBC: Recent Labs  Lab 06/15/20 1309  WBC 8.2  HGB 15.6*  HCT 45.6  MCV 89.2  PLT 544*    Basic Metabolic Panel: Recent Labs  Lab 06/15/20 1309  NA 138  K 3.7  CL 105  CO2 22  GLUCOSE 132*  BUN 9  CREATININE 0.92  CALCIUM 9.8    GFR: CrCl cannot be calculated (Unknown ideal weight.).  Liver Function Tests: No results for input(s): AST, ALT, ALKPHOS, BILITOT, PROT, ALBUMIN in the last 168 hours.  Urine analysis:    Component Value Date/Time   BILIRUBINUR Negative 06/14/2020 1457   KETONESUR trace (5) (A) 05/18/2020 1230   PROTEINUR Positive (A) 06/14/2020 1457   UROBILINOGEN 1.0 06/14/2020 1457   NITRITE Negative 06/14/2020 1457   LEUKOCYTESUR Large (3+) (A) 06/14/2020 1457    Radiological Exams on  Admission: DG Chest 2 View  Result Date: 06/15/2020 CLINICAL DATA:  Shortness of breath. EXAM: CHEST - 2 VIEW COMPARISON:  No prior. FINDINGS: Prior median sternotomy. Heart size normal. Patchy right lung infiltrates consistent with pneumonia. Mild bibasilar atelectasis. No pleural effusion or pneumothorax. Thoracic spine scoliosis concave left. Prior left mastectomy. IMPRESSION: Patchy right lung infiltrates consistent with pneumonia. Question patient's COVID status. Mild bibasilar atelectasis. Electronically Signed   By: Marcello Moores  Register   On: 06/15/2020 13:27    EKG: Independently reviewed.  Sinus tachycardia 103 bpm.  Some baseline wander and T wave flattening in V1.  Assessment/Plan Principal Problem:   Acute hypoxemic respiratory failure due to COVID-19 [facility]) Active Problems:   Hypertension   Hx of breast cancer   CAD (coronary artery disease)   COPD (chronic obstructive pulmonary disease) (HCC)   History of CVA (cerebrovascular accident) without residual deficits  Acute proximal respiratory failure due to COVID-19 >  Found to be positive here in ED in the setting of ongoing shortness of breath and daughter with recent positive.  Unvaccinated. > Requiring 2 L to maintain saturations,  patchy opacities at right lung on chest x-ray - Continue Decadron - Continue remdesivir - Pulmonary hygiene - As needed cough suppressant - Zinc, vitamin C - As needed inhaler - We will check procalcitonin, D-dimer, CRP, ferritin, fibrinogen - Daily CBC, CMP, CRP, D-dimer, ferritin, mag, Phos  COPD - Continue home to troponin, Breo, albuterol - As needed Combivent  CAD status post CABG x4 - Continue home aspirin, atorvastatin, lisinopril  History of CVA with resolution of speech  deficits - Continue home statin  Hypertension - Continue home lisinopril  History of breast cancer  DVT prophylaxis: Lovenox Code Status:   DNR, states daughter is healthcare power of attorney Family  Communication:  None on admission.  It does appear her daughter has been previously updated.   Disposition Plan:   Patient is from:  Home  Anticipated DC to:  Pending clinical course  Anticipated DC date:  Pending clinical course  Anticipated DC barriers: None  Consults called:  None  Admission status:  Observation, telemetry   Severity of Illness: The appropriate patient status for this patient is OBSERVATION. Observation status is judged to be reasonable and necessary in order to provide the required intensity of service to ensure the patient's safety. The patient's presenting symptoms, physical exam findings, and initial radiographic and laboratory data in the context of their medical condition is felt to place them at decreased risk for further clinical deterioration. Furthermore, it is anticipated that the patient will be medically stable for discharge from the hospital within 2 midnights of admission. The following factors support the patient status of observation.   " The patient's presenting symptoms include shortness of breath, orthopnea, edema. " The physical exam findings include trace rales. " The initial radiographic and laboratory data are concerning for right lung patchy infiltrates, respiratory panel positive for Covid, requiring oxygen to maintain saturations.Marcelyn Bruins MD [facility]  How to contact the [facility] Attending or Consulting provider [place] or covering provider during after hours [place], for this patient?   1. Check the care team in [facility] and look for a) attending/consulting [facility] provider listed and b) the [facility] team listed 2. Log into [email or web address] and use [place] universal password to access. If you do not have the password, please contact the hospital operator. 3. Locate the [facility] provider you are looking for under [facility] and page to a number that you can be directly reached. 4. If you still have difficulty reaching the provider, please  page the [facility] (Director on Call) for the Hospitalists listed on amion for assistance.  06/15/2020, 9:13 PM

## 2020-06-15 NOTE — ED Provider Notes (Signed)
[facility]ERGENCY DEPARTMENT Provider Note   CSN: [id number] Arrival date & time: 06/15/20  1246     History Chief Complaint  Patient presents with  . Shortness of Breath    Nancy Merritt is a 80 y.o. female hx of CVA, COPD, here presenting with shortness of breath and hypoxia.  Patient states that her daughter was positive for Covid about 2 weeks ago.  They do live together.  She states that for the last week or so, she has been having worsening shortness of breath.  Patient has been monitoring her oxygen level and it is in the low 90s and since yesterday it is down in the 80s.  Apparently at home he was reading 85%.  Patient did not receive the Covid vaccine.  The history is provided by the patient.       Past Medical History:  Diagnosis Date  . Arthritis   . Asthma   . Cancer ([place]) 1990  . Depression     Patient Active Problem List   Diagnosis Date Noted  . 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 History of CVA (cerebrovascular accident) without residual deficits 05/18/2020  . Elevated liver function tests 11/16/2019  . COPD exacerbation ([place]) 08/19/2019  . Advance directive discussed with patient 11/09/2018  . Hypercalcemia 04/13/2018  . Mild persistent asthma 04/07/2018  . Hypertension 04/07/2018  . Depression, recurrent ([place]) 04/07/2018  . Hx of breast cancer 04/07/2018  . Skin lesion of back 04/07/2018  . CAD (coronary artery disease) 04/07/2018    Past Surgical History:  Procedure Laterality Date  . BREAST SURGERY Left 1990  . CORONARY ARTERY BYPASS GRAFT    . JOINT REPLACEMENT Right    Hip  . OVARIAN CYST REMOVAL  1970  . TUBAL LIGATION  1970     OB History   No obstetric history on file.     Family History  Problem Relation Age of Onset  . Cancer Father        lung  . Heart attack Father 63  . Alcohol abuse Father   . Arthritis Father   . COPD Father   . Diabetes  Father   . Hypertension Father   . Breast cancer Sister 25  . Arthritis Sister   . Cancer Sister   . COPD Sister   . Drug abuse Sister   . Hypertension Sister   . Miscarriages / Stillbirths Sister   . Thyroid disease Sister   . Arthritis Mother   . COPD Mother   . Diabetes Mother   . Hearing loss Mother   . Hypertension Mother   . Miscarriages / [place] Mother   . Stroke Mother   . Alcohol abuse Brother   . Arthritis Brother   . Hypertension Brother   . Arthritis Daughter   . Asthma Daughter   . Depression Daughter   . Hypertension Daughter   . Miscarriages / [place] Daughter   . Thyroid disease Daughter   . Arthritis Maternal Grandmother   . Hypertension Maternal Grandmother   . Stroke Maternal Grandmother   . Thyroid disease Maternal Grandmother   . Alcohol abuse Maternal Grandfather   . Arthritis Maternal Grandfather   . Cancer Maternal Grandfather   . Depression Maternal Grandfather   . Hearing loss Maternal Grandfather   . Hypertension Maternal Grandfather   . Arthritis Sister   . Thyroid disease Sister   . Thyroid disease Daughter   .  Hypertension Daughter   . Depression Daughter   . COPD Daughter   . Alcohol abuse Daughter   . Arthritis Daughter   . Arthritis Daughter   . COPD Daughter   . Depression Daughter   . Hearing loss Daughter   . Hypertension Daughter   . Thyroid disease Daughter     Social History   Tobacco Use  . Smoking status: Former Smoker    Packs/day: 1.00    Years: 40.00    Pack years: 40.00    Types: Cigarettes    Quit date: 2002    Years since quitting: 20.0  . Smokeless tobacco: Never Used  Vaping Use  . Vaping Use: Never used  Substance Use Topics  . Alcohol use: Not Currently  . Drug use: Never    Home Medications Prior to Admission medications   Medication Sig Start Date End Date Taking? Authorizing Provider  albuterol (PROVENTIL) (2.5 MG/3ML) 0.083% nebulizer solution INHALE 1 VIAL VIA NEBULIZER 3 TIMES A  DAY AS NEEDED 03/27/20   Lesleigh Noe, MD  albuterol (VENTOLIN HFA) 108 (90 Base) MCG/ACT inhaler Inhale 2 puffs into the lungs every 4 (four) hours as needed for wheezing or shortness of breath. 05/31/20   Lesleigh Noe, MD  atorvastatin (LIPITOR) 40 MG tablet Take 1 tablet (40 mg total) by mouth daily. 05/31/20   Lesleigh Noe, MD  Azelastine HCl 0.15 % SOLN Place 1 spray into the nose daily as needed. 04/07/18   Lesleigh Noe, MD  BREO ELLIPTA 200-25 MCG/INH AEPB INHALE 1 PUFF BY MOUTH EVERY DAY 03/09/20   Lesleigh Noe, MD  [employer] ASPIRIN ADULT LOW DOSE 81 MG chewable tablet Chew 81 mg by mouth daily. 05/04/20   [provider]  ibuprofen (ADVIL,MOTRIN) 200 MG tablet Take 200 mg by mouth every 6 (six) hours as needed.    [provider]  lisinopril (ZESTRIL) 20 MG tablet TAKE 1 TABLET (20 MG TOTAL) BY MOUTH IN THE MORNING AND AT BEDTIME. 05/25/20   Lesleigh Noe, MD  loratadine (CLARITIN) 10 MG tablet Take 10 mg by mouth daily.    [provider]  SPIRIVA RESPIMAT 2.5 MCG/ACT AERS INHALE 2 PUFFS INTO THE LUNGS EVERY DAY 05/29/19   Lesleigh Noe, MD  sulfamethoxazole-trimethoprim (BACTRIM DS) 800-160 MG tablet Take 1 tablet by mouth 2 (two) times daily for 7 days. 06/14/20 06/21/20  Owens Loffler, MD    Allergies    Advair diskus [fluticasone-salmeterol], Levofloxacin, Qvar [beclomethasone], Singulair [montelukast], and Symbicort [budesonide-formoterol fumarate]  Review of Systems   Review of Systems  Respiratory: Positive for shortness of breath.   All other systems reviewed and are negative.   Physical Exam Updated Vital Signs BP 103/71   Pulse 91   Temp 99.1 F (37.3 C) (Oral)   Resp (!) 21   Ht 5' 2.5" (1.588 m)   SpO2 96%   BMI 30.31 kg/m   Physical Exam Vitals and nursing note reviewed.  Constitutional:      Comments: Chronically ill, tachypneic  HENT:     Head: Normocephalic.     Mouth/Throat:     Mouth: Mucous membranes are moist.   Eyes:     Extraocular Movements: Extraocular movements intact.     Pupils: Pupils are equal, round, and reactive to light.  Cardiovascular:     Rate and Rhythm: Normal rate and regular rhythm.  Pulmonary:     Comments: Tachypneic, diminished bilateral bases, + wheezing throughout  Musculoskeletal:  General: Normal range of motion.     Cervical back: Normal range of motion and neck supple.     Right lower leg: No edema.     Left lower leg: No edema.  Skin:    General: Skin is warm.     Capillary Refill: Capillary refill takes less than 2 seconds.  Neurological:     General: No focal deficit present.     Mental Status: She is oriented to person, place, and time.  Psychiatric:        Mood and Affect: Mood normal.        Behavior: Behavior normal.     ED Results / Procedures / Treatments   Labs (all labs ordered are listed, but only abnormal results are displayed) Labs Reviewed  SARS CORONAVIRUS 2 BY RT PCR (HOSPITAL ORDER, [facility]) - Abnormal; Notable for the following components:      Result Value   SARS Coronavirus 2 POSITIVE (*)    All other components within normal limits  CBC - Abnormal; Notable for the following components:   Hemoglobin 15.6 (*)    Platelets 544 (*)    All other components within normal limits  BASIC METABOLIC PANEL - Abnormal; Notable for the following components:   Glucose, Bld 132 (*)    All other components within normal limits  BRAIN NATRIURETIC PEPTIDE  TROPONIN I (HIGH SENSITIVITY)  TROPONIN I (HIGH SENSITIVITY)    EKG EKG Interpretation  Date/Time:  Thursday June 15 2020 12:57:40 EST Ventricular Rate:  103 PR Interval:  142 QRS Duration: 68 QT Interval:  320 QTC Calculation: 419 R Axis:   58 Text Interpretation: Sinus tachycardia Otherwise normal ECG poor baseline Confirmed by Wandra Arthurs [phone]) on 06/15/2020 4:39:37 PM   Radiology DG Chest 2 View  Result Date: 06/15/2020 CLINICAL DATA:   Shortness of breath. EXAM: CHEST - 2 VIEW COMPARISON:  No prior. FINDINGS: Prior median sternotomy. Heart size normal. Patchy right lung infiltrates consistent with pneumonia. Mild bibasilar atelectasis. No pleural effusion or pneumothorax. Thoracic spine scoliosis concave left. Prior left mastectomy. IMPRESSION: Patchy right lung infiltrates consistent with pneumonia. Question patient's COVID status. Mild bibasilar atelectasis. Electronically Signed   By: Marcello Moores  Register   On: 06/15/2020 13:27    Procedures Procedures   CRITICAL CARE Performed by: Wandra Arthurs   Total critical care time: 30  minutes  Critical care time was exclusive of separately billable procedures and treating other patients.  Critical care was necessary to treat or prevent imminent or life-threatening deterioration.  Critical care was time spent personally by me on the following activities: development of treatment plan with patient and/or surrogate as well as nursing, discussions with consultants, evaluation of patient's response to treatment, examination of patient, obtaining history from patient or surrogate, ordering and performing treatments and interventions, ordering and review of laboratory studies, ordering and review of radiographic studies, pulse oximetry and re-evaluation of patient's condition.   Medications Ordered in ED Medications  dexamethasone (DECADRON) injection 6 mg (6 mg Intravenous Given 06/15/20 1701)  albuterol (VENTOLIN HFA) 108 (90 Base) MCG/ACT inhaler 2 puff (2 puffs Inhalation Given 06/15/20 1703)    ED Course  I have reviewed the triage vital signs and the nursing notes.  Pertinent labs & imaging results that were available during my care of the patient were reviewed by me and considered in my medical decision making (see chart for details).    MDM Rules/Calculators/A&P  Nancy Merritt is a 80 y.o. female here with SOB.  Patient is hypoxic to 85%.  Patient is  not on oxygen at baseline.  Consider Covid versus CHF versus COPD.  Patient is not vaccinated against COVID. Will get covid test, cxr, bnp.   6:44 PM Labs unremarkable and chest x-ray showed Covid pneumonia.  Covid test is positive.  Given Decadron and remdesivir.  Will admit for hypoxia from Covid    Final Clinical Impression(s) / ED Diagnoses Final diagnoses:  None    Rx / DC Orders ED Discharge Orders    None       Drenda Freeze, MD 06/15/20 1845

## 2020-06-15 NOTE — Telephone Encounter (Signed)
Nancy Merritt (DPR called) pt is having some difficulty breathing and SOB upon exertion; pt has COPD and has not been feeling well for couple of weeks. Pt has prod cough but not sure color of phlegm; no fever but does have runny nose and sinus congestion. pts pulse ox earlier was 85% and P 113. Pt is not having CP and has not been tested for covid; Nancy Merritt said she has had covid in last 14 days and has been around pt without a mask. Nancy Merritt said pt is just getting out of shower and recked pulse ox now at 83%. Nancy Merritt declined 911 and will take pt now to [facility] ED. Sending note to DR Einar Pheasant.

## 2020-06-15 NOTE — ED Triage Notes (Signed)
Pt reports shortness of breath, especially when lying down, and BLE swelling for several weeks. Hx of CABG x 4 and CHF. Compliant with diuretic. Reports home SpO2 reading of 85%.

## 2020-06-15 NOTE — Telephone Encounter (Signed)
Agree with ER.

## 2020-06-16 DIAGNOSIS — Z8349 Family history of other endocrine, nutritional and metabolic diseases: Secondary | ICD-10-CM | POA: Diagnosis not present

## 2020-06-16 DIAGNOSIS — Z8261 Family history of arthritis: Secondary | ICD-10-CM | POA: Diagnosis not present

## 2020-06-16 DIAGNOSIS — Z823 Family history of stroke: Secondary | ICD-10-CM | POA: Diagnosis not present

## 2020-06-16 DIAGNOSIS — Z8673 Personal history of transient ischemic attack (TIA), and cerebral infarction without residual deficits: Secondary | ICD-10-CM

## 2020-06-16 DIAGNOSIS — Z853 Personal history of malignant neoplasm of breast: Secondary | ICD-10-CM | POA: Diagnosis not present

## 2020-06-16 DIAGNOSIS — J411 Mucopurulent chronic bronchitis: Secondary | ICD-10-CM | POA: Diagnosis not present

## 2020-06-16 DIAGNOSIS — Z888 Allergy status to other drugs, medicaments and biological substances status: Secondary | ICD-10-CM | POA: Diagnosis not present

## 2020-06-16 DIAGNOSIS — Z87891 Personal history of nicotine dependence: Secondary | ICD-10-CM | POA: Diagnosis not present

## 2020-06-16 DIAGNOSIS — Z813 Family history of other psychoactive substance abuse and dependence: Secondary | ICD-10-CM | POA: Diagnosis not present

## 2020-06-16 DIAGNOSIS — U071 COVID-19: Secondary | ICD-10-CM | POA: Diagnosis present

## 2020-06-16 DIAGNOSIS — J9601 Acute respiratory failure with hypoxia: Secondary | ICD-10-CM | POA: Diagnosis present

## 2020-06-16 DIAGNOSIS — Z825 Family history of asthma and other chronic lower respiratory diseases: Secondary | ICD-10-CM | POA: Diagnosis not present

## 2020-06-16 DIAGNOSIS — J440 Chronic obstructive pulmonary disease with acute lower respiratory infection: Secondary | ICD-10-CM | POA: Diagnosis present

## 2020-06-16 DIAGNOSIS — Z818 Family history of other mental and behavioral disorders: Secondary | ICD-10-CM | POA: Diagnosis not present

## 2020-06-16 DIAGNOSIS — Z803 Family history of malignant neoplasm of breast: Secondary | ICD-10-CM | POA: Diagnosis not present

## 2020-06-16 DIAGNOSIS — F32A Depression, unspecified: Secondary | ICD-10-CM | POA: Diagnosis present

## 2020-06-16 DIAGNOSIS — Z96641 Presence of right artificial hip joint: Secondary | ICD-10-CM | POA: Diagnosis present

## 2020-06-16 DIAGNOSIS — I2510 Atherosclerotic heart disease of native coronary artery without angina pectoris: Secondary | ICD-10-CM | POA: Diagnosis present

## 2020-06-16 DIAGNOSIS — Z811 Family history of alcohol abuse and dependence: Secondary | ICD-10-CM | POA: Diagnosis not present

## 2020-06-16 DIAGNOSIS — I10 Essential (primary) hypertension: Secondary | ICD-10-CM | POA: Diagnosis present

## 2020-06-16 DIAGNOSIS — Z66 Do not resuscitate: Secondary | ICD-10-CM | POA: Diagnosis present

## 2020-06-16 DIAGNOSIS — Z833 Family history of diabetes mellitus: Secondary | ICD-10-CM | POA: Diagnosis not present

## 2020-06-16 DIAGNOSIS — Z8249 Family history of ischemic heart disease and other diseases of the circulatory system: Secondary | ICD-10-CM | POA: Diagnosis not present

## 2020-06-16 DIAGNOSIS — J1282 Pneumonia due to coronavirus disease 2019: Secondary | ICD-10-CM | POA: Diagnosis present

## 2020-06-16 DIAGNOSIS — Z951 Presence of aortocoronary bypass graft: Secondary | ICD-10-CM | POA: Diagnosis not present

## 2020-06-16 LAB — CBC WITH DIFFERENTIAL/PLATELET
Abs Immature Granulocytes: 0.07 10*3/uL (ref 0.00–0.07)
Basophils Absolute: 0 10*3/uL (ref 0.0–0.1)
Basophils Relative: 0 %
Eosinophils Absolute: 0 10*3/uL (ref 0.0–0.5)
Eosinophils Relative: 0 %
HCT: 43.4 % (ref 36.0–46.0)
Hemoglobin: 14.4 g/dL (ref 12.0–15.0)
Immature Granulocytes: 1 %
Lymphocytes Relative: 17 %
Lymphs Abs: 0.9 10*3/uL (ref 0.7–4.0)
MCH: 29.7 pg (ref 26.0–34.0)
MCHC: 33.2 g/dL (ref 30.0–36.0)
MCV: 89.5 fL (ref 80.0–100.0)
Monocytes Absolute: 0.4 10*3/uL (ref 0.1–1.0)
Monocytes Relative: 8 %
Neutro Abs: 4 10*3/uL (ref 1.7–7.7)
Neutrophils Relative %: 74 %
Platelets: 510 10*3/uL — ABNORMAL HIGH (ref 150–400)
RBC: 4.85 MIL/uL (ref 3.87–5.11)
RDW: 14.4 % (ref 11.5–15.5)
WBC: 5.3 10*3/uL (ref 4.0–10.5)
nRBC: 0 % (ref 0.0–0.2)

## 2020-06-16 LAB — COMPREHENSIVE METABOLIC PANEL
ALT: 30 U/L (ref 0–44)
AST: 32 U/L (ref 15–41)
Albumin: 2.7 g/dL — ABNORMAL LOW (ref 3.5–5.0)
Alkaline Phosphatase: 102 U/L (ref 38–126)
Anion gap: 12 (ref 5–15)
BUN: 14 mg/dL (ref 8–23)
CO2: 22 mmol/L (ref 22–32)
Calcium: 10 mg/dL (ref 8.9–10.3)
Chloride: 101 mmol/L (ref 98–111)
Creatinine, Ser: 0.92 mg/dL (ref 0.44–1.00)
GFR, Estimated: >60 mL/min (ref >60)
Glucose, Bld: 171 mg/dL — ABNORMAL HIGH (ref 70–99)
Potassium: 3.9 mmol/L (ref 3.5–5.1)
Sodium: 135 mmol/L (ref 135–145)
Total Bilirubin: 0.8 mg/dL (ref 0.3–1.2)
Total Protein: 6.3 g/dL — ABNORMAL LOW (ref 6.5–8.1)

## 2020-06-16 LAB — URINE CULTURE
MICRO NUMBER:: 11459836
SPECIMEN QUALITY:: ADEQUATE

## 2020-06-16 LAB — D-DIMER, QUANTITATIVE: D-Dimer, Quant: 1.19 ug/mL-FEU — ABNORMAL HIGH (ref 0.00–0.50)

## 2020-06-16 LAB — FERRITIN: Ferritin: 607 ng/mL — ABNORMAL HIGH (ref 11–307)

## 2020-06-16 LAB — PHOSPHORUS: Phosphorus: 3.1 mg/dL (ref 2.5–4.6)

## 2020-06-16 LAB — C-REACTIVE PROTEIN: CRP: 4.6 mg/dL — ABNORMAL HIGH (ref <1.0)

## 2020-06-16 LAB — MAGNESIUM: Magnesium: 1.8 mg/dL (ref 1.7–2.4)

## 2020-06-16 NOTE — ED Notes (Signed)
Pt given incentive spirometer and instructed on use, pt verbalized understanding

## 2020-06-16 NOTE — ED Notes (Signed)
Pt transferred to hospital bed for comfort.

## 2020-06-16 NOTE — ED Notes (Signed)
Attempted to give reportx1 

## 2020-06-16 NOTE — Progress Notes (Signed)
PROGRESS NOTE    Nancy Merritt  OHY:[id number] DOB: [birth year] DOA: 06/15/2020 PCP: Lesleigh Noe, MD    Brief Narrative:  80 y.o. female with medical history significant of CAD status post CABG x4, COPD, depression, CVA , breast cancer, hypertension who presents with about a week of shortness of breath, orthopnea.             She states she has had about a month of increased leg swelling, 2 weeks of fatigue and 1 week of shortness of breath as above. She was checking her home pulse ox back on the recommendation of her doctor and found that she had desatted to 85% at home.  She called her doctor who recommended that she go to the ED to be evaluated.  Patient continued to have saturations in the mid to upper 80s which improved with 2 L of oxygen.             Of note, Patient is unvaccinated and her daughter was positive for Covid 2 weeks ago.  She lives with her daughter. Patient denies fevers, chills, chest pain, dental pain, constipation, diarrhea, nausea, vomiting.  ED Course: Vital signs in the ED significant for requiring 2 L to maintain saturations, tachypnea in the 20s, heart rate in the 90s to low 100s.  Lab work-up showed BMP with glucose 132.  CBC with hemoglobin stable 15.6 and platelets stable at 544.  BMP normal.  Troponin pending.  Respiratory panel for flu and Covid positive for Covid.  Imaging showed chest x-ray with patchy right lung infiltrates.  Patient started on steroids and remdesivir in ED.  Assessment & Plan:   Principal Problem:   Acute hypoxemic respiratory failure due to COVID-19 [facility]) Active Problems:   Hypertension   Hx of breast cancer   CAD (coronary artery disease)   COPD (chronic obstructive pulmonary disease) (HCC)   History of CVA (cerebrovascular accident) without residual deficits   Acute proximal respiratory failure due to COVID-19 > Noted to be covid pos > in the setting of ongoing shortness of breath and daughter with recent positive.   Unvaccinated. >Requiring 2.5 L to maintain saturations >patchy opacities were noted in R lung fields on chest x-ray -Continue on Decadron as tolerated -Continue on remdesivir -Zinc, vitamin C -Cont PRN bronchodilator - cont to follow serial inflammatory markers  COPD - Continue home to troponin, Breo, albuterol - cont with PRN Combivent  CAD status post CABG x4 - Cont wtih home aspirin, atorvastatin, lisinopril  History of CVA with resolution of speech deficits - Continue home statin as tolerated  Hypertension - Continue home lisinopril -BP currently stable and controled  History of breast cancer -Followed by Dr. Alen Blew as outpatient  DVT prophylaxis: Lovenox subq Code Status: DNR Family Communication: Pt in room, family not at bedside  Status is: Observation  The patient will require care spanning > 2 midnights and should be moved to inpatient because: Unsafe d/c plan and Inpatient level of care appropriate due to severity of illness  Dispo: The patient is from: Home              Anticipated d/c is to: Home              Anticipated d/c date is: 2 days              Patient currently is not medically stable to d/c.   Difficult to place patient No   Consultants:     Procedures:  Antimicrobials: Anti-infectives (From admission, onward)   Start     Dose/Rate Route Frequency Ordered Stop   06/16/20 1000  remdesivir 100 mg in sodium chloride 0.9 % 100 mL IVPB       "Followed by" Linked Group Details   100 mg 200 mL/hr over 30 Minutes Intravenous Daily 06/15/20 1828 06/20/20 0959   06/15/20 1830  remdesivir 200 mg in sodium chloride 0.9% 250 mL IVPB       "Followed by" Linked Group Details   200 mg 580 mL/hr over 30 Minutes Intravenous Once 06/15/20 1828 06/15/20 2213       Subjective: Feeling generally weak  Objective: Vitals:   06/16/20 0756 06/16/20 0900 06/16/20 1200 06/16/20 1500  BP:  110/72 133/69 (!) 123/51  Pulse:  91 83 (!) 105   Resp:  (!) 28 (!) 23 (!) 22  Temp: 97.8 F (36.6 C)     TempSrc: Oral     SpO2:  94% 95% 92%  Height:        Intake/Output Summary (Last 24 hours) at 06/16/2020 1733 Last data filed at 06/16/2020 1207 Gross per 24 hour  Intake 100 ml  Output --  Net 100 ml   There were no vitals filed for this visit.  Examination:  General exam: Appears calm and comfortable  Respiratory system: no audible wheezing. Respiratory effort normal. Cardiovascular system: perfused, no notable jvd Gastrointestinal system: Abdomen is nondistended, soft and nontender. No organomegaly or masses felt. Normal bowel sounds heard. Central nervous system: Alert and oriented. No focal neurological deficits. Extremities: Symmetric 5 x 5 power. Skin: No rashes, lesions  Psychiatry: Judgement and insight appear normal. Mood & affect appropriate.   Data Reviewed: I have personally reviewed following labs and imaging studies  CBC: Recent Labs  Lab 06/15/20 1309 06/16/20 0704  WBC 8.2 5.3  NEUTROABS  --  4.0  HGB 15.6* 14.4  HCT 45.6 43.4  MCV 89.2 89.5  PLT 544* [id number]*   Basic Metabolic Panel: Recent Labs  Lab 06/15/20 1309 06/16/20 0704  NA 138 135  K 3.7 3.9  CL 105 101  CO2 22 22  GLUCOSE 132* 171*  BUN 9 14  CREATININE 0.92 0.92  CALCIUM 9.8 10.0  MG  --  1.8  PHOS  --  3.1   GFR: CrCl cannot be calculated (Unknown ideal weight.). Liver Function Tests: Recent Labs  Lab 06/16/20 0704  AST 32  ALT 30  ALKPHOS 102  BILITOT 0.8  PROT 6.3*  ALBUMIN 2.7*   No results for input(s): LIPASE, AMYLASE in the last 168 hours. No results for input(s): AMMONIA in the last 168 hours. Coagulation Profile: No results for input(s): INR, PROTIME in the last 168 hours. Cardiac Enzymes: No results for input(s): CKTOTAL, CKMB, CKMBINDEX, TROPONINI in the last 168 hours. BNP (last 3 results) No results for input(s): PROBNP in the last 8760 hours. HbA1C: No results for input(s): HGBA1C in the last  72 hours. CBG: No results for input(s): GLUCAP in the last 168 hours. Lipid Profile: No results for input(s): CHOL, HDL, LDLCALC, TRIG, CHOLHDL, LDLDIRECT in the last 72 hours. Thyroid Function Tests: No results for input(s): TSH, T4TOTAL, FREET4, T3FREE, THYROIDAB in the last 72 hours. Anemia Panel: Recent Labs    06/15/20 2104 06/16/20 0704  FERRITIN 656* 607*   Sepsis Labs: Recent Labs  Lab 06/15/20 2104  PROCALCITON <0.10    Recent Results (from the past 240 hour(s))  Urine Culture     Status: Abnormal (  Preliminary result)   Collection Time: 06/14/20  3:09 PM   Specimen: Urine  Result Value Ref Range Status   MICRO NUMBER: [id number]  Preliminary   SPECIMEN QUALITY: Adequate  Preliminary   Sample Source URINE, CLEAN CATCH  Preliminary   STATUS: PRELIMINARY  Preliminary   ISOLATE 1: Gram negative bacilli isolated (A)  Preliminary    Comment: Greater than 100,000 CFU/mL of Gram negative bacilli isolated Identification and susceptibilities to follow.  SARS Coronavirus 2 by RT PCR (hospital order, performed in [facility] hospital lab) Nasopharyngeal Nasopharyngeal Swab     Status: Abnormal   Collection Time: 06/15/20  4:54 PM   Specimen: Nasopharyngeal Swab  Result Value Ref Range Status   SARS Coronavirus 2 POSITIVE (A) NEGATIVE Final    Comment: RESULT CALLED TO, READ BACK BY AND VERIFIED WITH: H DOOLEY RN 1817 06/15/20 A BROWNING (NOTE) SARS-CoV-2 target nucleic acids are DETECTED  SARS-CoV-2 RNA is generally detectable in upper respiratory specimens  during the acute phase of infection.  Positive results are indicative  of the presence of the identified virus, but do not rule out bacterial infection or co-infection with other pathogens not detected by the test.  Clinical correlation with patient history and  other diagnostic information is necessary to determine patient infection status.  The expected result is negative.  Fact Sheet for Patients:    StrictlyIdeas.no   Fact Sheet for Healthcare Providers:   BankingDealers.co.za    This test is not yet approved or cleared by the [place] FDA and  has been authorized for detection and/or diagnosis of SARS-CoV-2 by FDA under an Emergency Use Authorization (EUA).  This EUA will remain in effect (meaning this t est can be used) for the duration of  the COVID-19 declaration under Section 564(b)(1) of the Act, 21 U.S.C. section 360-bbb-3(b)(1), unless the authorization is terminated or revoked sooner.  Performed at [facility], [place] [address], [place], [place] [address]      Radiology Studies: DG Chest 2 View  Result Date: 06/15/2020 CLINICAL DATA:  Shortness of breath. EXAM: CHEST - 2 VIEW COMPARISON:  No prior. FINDINGS: Prior median sternotomy. Heart size normal. Patchy right lung infiltrates consistent with pneumonia. Mild bibasilar atelectasis. No pleural effusion or pneumothorax. Thoracic spine scoliosis concave left. Prior left mastectomy. IMPRESSION: Patchy right lung infiltrates consistent with pneumonia. Question patient's COVID status. Mild bibasilar atelectasis. Electronically Signed   By: [place]   On: 06/15/2020 13:27    Scheduled Meds: . vitamin C  500 mg Oral Daily  . aspirin  81 mg Oral Daily  . atorvastatin  40 mg Oral Daily  . dexamethasone (DECADRON) injection  6 mg Intravenous Q24H  . enoxaparin (LOVENOX) injection  40 mg Subcutaneous Q24H  . fluticasone furoate-vilanterol  1 puff Inhalation Daily  . lisinopril  20 mg Oral Daily  . sodium chloride flush  3 mL Intravenous Q12H  . umeclidinium bromide  1 puff Inhalation Daily  . zinc sulfate  220 mg Oral Daily   Continuous Infusions: . remdesivir 100 mg in NS 100 mL Stopped (06/16/20 1207)     LOS: 0 days   Marylu Lund, MD [facility] Pager On Amion  If 7PM-7AM, please contact night-coverage 06/16/2020, 5:33 PM

## 2020-06-16 NOTE — ED Notes (Signed)
Attempted to titrate pt off the oxygen but pt dropped to 86% on room air, 2L [place] reapplied

## 2020-06-16 NOTE — ED Notes (Signed)
Attempted to give report. Floor not wanting to take report at this time. States that they need to wait for night shift to get report.

## 2020-06-16 NOTE — ED Notes (Signed)
Dinner Trays Ordered @ 1635. 

## 2020-06-17 ENCOUNTER — Encounter (HOSPITAL_COMMUNITY): Payer: Self-pay | Admitting: Internal Medicine

## 2020-06-17 DIAGNOSIS — I10 Essential (primary) hypertension: Secondary | ICD-10-CM

## 2020-06-17 LAB — CBC WITH DIFFERENTIAL/PLATELET
Abs Immature Granulocytes: 0.16 10*3/uL — ABNORMAL HIGH (ref 0.00–0.07)
Basophils Absolute: 0 10*3/uL (ref 0.0–0.1)
Basophils Relative: 0 %
Eosinophils Absolute: 0 10*3/uL (ref 0.0–0.5)
Eosinophils Relative: 0 %
HCT: 45.9 % (ref 36.0–46.0)
Hemoglobin: 15.3 g/dL — ABNORMAL HIGH (ref 12.0–15.0)
Immature Granulocytes: 1 %
Lymphocytes Relative: 12 %
Lymphs Abs: 1.8 10*3/uL (ref 0.7–4.0)
MCH: 29.8 pg (ref 26.0–34.0)
MCHC: 33.3 g/dL (ref 30.0–36.0)
MCV: 89.5 fL (ref 80.0–100.0)
Monocytes Absolute: 0.9 10*3/uL (ref 0.1–1.0)
Monocytes Relative: 6 %
Neutro Abs: 12.8 10*3/uL — ABNORMAL HIGH (ref 1.7–7.7)
Neutrophils Relative %: 81 %
Platelets: 740 10*3/uL — ABNORMAL HIGH (ref 150–400)
RBC: 5.13 MIL/uL — ABNORMAL HIGH (ref 3.87–5.11)
RDW: 14.3 % (ref 11.5–15.5)
WBC: 15.7 10*3/uL — ABNORMAL HIGH (ref 4.0–10.5)
nRBC: 0 % (ref 0.0–0.2)

## 2020-06-17 LAB — COMPREHENSIVE METABOLIC PANEL
ALT: 38 U/L (ref 0–44)
AST: 42 U/L — ABNORMAL HIGH (ref 15–41)
Albumin: 2.9 g/dL — ABNORMAL LOW (ref 3.5–5.0)
Alkaline Phosphatase: 103 U/L (ref 38–126)
Anion gap: 13 (ref 5–15)
BUN: 25 mg/dL — ABNORMAL HIGH (ref 8–23)
CO2: 21 mmol/L — ABNORMAL LOW (ref 22–32)
Calcium: 10.6 mg/dL — ABNORMAL HIGH (ref 8.9–10.3)
Chloride: 104 mmol/L (ref 98–111)
Creatinine, Ser: 0.98 mg/dL (ref 0.44–1.00)
GFR, Estimated: 58 mL/min — ABNORMAL LOW (ref >60)
Glucose, Bld: 126 mg/dL — ABNORMAL HIGH (ref 70–99)
Potassium: 4.3 mmol/L (ref 3.5–5.1)
Sodium: 138 mmol/L (ref 135–145)
Total Bilirubin: 0.5 mg/dL (ref 0.3–1.2)
Total Protein: 6.2 g/dL — ABNORMAL LOW (ref 6.5–8.1)

## 2020-06-17 LAB — D-DIMER, QUANTITATIVE: D-Dimer, Quant: 1.03 ug/mL-FEU — ABNORMAL HIGH (ref 0.00–0.50)

## 2020-06-17 LAB — PHOSPHORUS: Phosphorus: 3.4 mg/dL (ref 2.5–4.6)

## 2020-06-17 LAB — C-REACTIVE PROTEIN: CRP: 2.3 mg/dL — ABNORMAL HIGH (ref <1.0)

## 2020-06-17 LAB — FERRITIN: Ferritin: 564 ng/mL — ABNORMAL HIGH (ref 11–307)

## 2020-06-17 LAB — MAGNESIUM: Magnesium: 2.1 mg/dL (ref 1.7–2.4)

## 2020-06-17 MED ORDER — ORAL CARE MOUTH RINSE
15.0000 mL | Freq: Two times a day (BID) | OROMUCOSAL | Status: DC
  Administered 2020-06-17 – 2020-06-20 (×7): 15 mL via OROMUCOSAL

## 2020-06-17 MED ORDER — ALBUTEROL SULFATE HFA 108 (90 BASE) MCG/ACT IN AERS: 2.0000 | INHALATION_SPRAY | RESPIRATORY_TRACT | Status: DC | PRN

## 2020-06-17 NOTE — Plan of Care (Signed)
  Problem: Nutrition: Goal: Adequate nutrition will be maintained Outcome: Progressing   Problem: Pain Managment: Goal: General experience of comfort will improve Outcome: Progressing   Problem: Safety: Goal: Ability to remain free from injury will improve Outcome: Progressing   Problem: Skin Integrity: Goal: Risk for impaired skin integrity will decrease Outcome: Progressing   Problem: Education: Goal: Knowledge of risk factors and measures for prevention of condition will improve Outcome: Progressing   Problem: Coping: Goal: Psychosocial and spiritual needs will be supported Outcome: Progressing   Problem: Respiratory: Goal: Will maintain a patent airway Outcome: Progressing Goal: Complications related to the disease process, condition or treatment will be avoided or minimized Outcome: Progressing   Problem: Education: Goal: Knowledge of disease or condition will improve Outcome: Progressing Goal: Knowledge of the prescribed therapeutic regimen will improve Outcome: Progressing Goal: Individualized Educational Video(s) Outcome: Progressing   Problem: Activity: Goal: Ability to tolerate increased activity will improve Outcome: Progressing Goal: Will verbalize the importance of balancing activity with adequate rest periods Outcome: Progressing   Problem: Respiratory: Goal: Ability to maintain a clear airway will improve Outcome: Progressing Goal: Levels of oxygenation will improve Outcome: Progressing Goal: Ability to maintain adequate ventilation will improve Outcome: Progressing

## 2020-06-17 NOTE — Evaluation (Signed)
Physical Therapy Evaluation Patient Details Name: Nancy Merritt MRN: [id number] DOB: 08/10/1940 Today's Date: 06/17/2020   History of Present Illness  80 y.o. female with medical history significant of CAD status post CABG x4, COPD, depression, CVA , breast cancer, hypertension who presented to the ED with about a week of shortness of breath, orthopnea.Admitted COVID+.    Clinical Impression  Pt admitted with above diagnosis. PTA pt independent and active, living at home with her daughter. On eval, she required supervision ambulation 50' without AD. Pt demonstrates mod I bed mobility and transfers. Pt on 2L O2. O2 monitor not working Investment banker, corporate notified), unable to obtain SpO2. 2/4 DOE noted with ambulation. Pt currently with functional limitations due to the deficits listed below. Pt will benefit from skilled PT to increase their independence and safety with mobility to allow discharge home. PT to follow acutely. No follow up services indicated.       Follow Up Recommendations No PT follow up;Supervision for mobility/OOB    Equipment Recommendations  None recommended by PT    Recommendations for Other Services       Precautions / Restrictions Precautions Precautions: None      Mobility  Bed Mobility Overal bed mobility: Modified Independent                  Transfers Overall transfer level: Modified independent Equipment used: None                Ambulation/Gait Ambulation/Gait assistance: Supervision Gait Distance (Feet): 50 Feet Assistive device: None Gait Pattern/deviations: WFL(Within Functional Limits) Gait velocity: WFL Gait velocity interpretation: 1.31 - 2.62 ft/sec, indicative of limited community ambulator General Gait Details: Mobilized on 2L O2. 2/4 DOE. O2 monitor not reading, unable to obtain SpO2. Pt declining hallway amb due to fatigue (reports not sleeping well last night).  Stairs            Wheelchair Mobility    Modified Rankin  (Stroke Patients Only)       Balance Overall balance assessment: Mild deficits observed, not formally tested                                           Pertinent Vitals/Pain Pain Assessment: No/denies pain    Home Living Family/patient expects to be discharged to:: Private residence Living Arrangements: Children (daughter) Available Help at Discharge: Family;Available 24 hours/day Type of Home: House Home Access: Stairs to enter Entrance Stairs-Rails: Psychiatric nurse of Steps: 5 Home Layout: One level Home Equipment: Cane - single point      Prior Function Level of Independence: Independent with assistive device(s)         Comments: occassional use of cane in community; drives, cooks     Journalist, newspaper        Extremity/Trunk Assessment   Upper Extremity Assessment Upper Extremity Assessment: Overall WFL for tasks assessed    Lower Extremity Assessment Lower Extremity Assessment: Overall WFL for tasks assessed    Cervical / Trunk Assessment Cervical / Trunk Assessment: Kyphotic  Communication   Communication: No difficulties  Cognition Arousal/Alertness: Awake/alert Behavior During Therapy: WFL for tasks assessed/performed Overall Cognitive Status: Within Functional Limits for tasks assessed  General Comments      Exercises     Assessment/Plan    PT Assessment Patient needs continued PT services  PT Problem List Decreased mobility;Cardiopulmonary status limiting activity;Decreased activity tolerance       PT Treatment Interventions Therapeutic activities;Gait training;Therapeutic exercise;Patient/family education;Balance training;Functional mobility training    PT Goals (Current goals can be found in the Care Plan section)  Acute Rehab PT Goals Patient Stated Goal: home PT Goal Formulation: With patient Time For Goal Achievement: 07/01/20 Potential to  Achieve Goals: Good    Frequency Min 3X/week   Barriers to discharge        Co-evaluation               AM-PAC PT "6 Clicks" Mobility  Outcome Measure Help needed turning from your back to your side while in a flat bed without using bedrails?: None Help needed moving from lying on your back to sitting on the side of a flat bed without using bedrails?: None Help needed moving to and from a bed to a chair (including a wheelchair)?: None Help needed standing up from a chair using your arms (e.g., wheelchair or bedside chair)?: None Help needed to walk in hospital room?: A Little Help needed climbing 3-5 steps with a railing? : A Little 6 Click Score: 22    End of Session Equipment Utilized During Treatment: Oxygen Activity Tolerance: Patient tolerated treatment well Patient left: in bed;with call bell/phone within reach Nurse Communication: Mobility status PT Visit Diagnosis: Difficulty in walking, not elsewhere classified (R26.2)    Time: 0940-1000 PT Time Calculation (min) (ACUTE ONLY): 20 min   Charges:   PT Evaluation $PT Eval Moderate Complexity: 1 Mod          Lorrin Goodell, PT  Office # [phone] Pager [phone]   Lorriane Shire 06/17/2020, 2:30 PM

## 2020-06-17 NOTE — Progress Notes (Signed)
PROGRESS NOTE    Nancy Merritt  [id number] DOB: Jan 11, 1941 DOA: 06/15/2020 PCP: Lesleigh Noe, MD    Brief Narrative:  80 y.o. female with medical history significant of CAD status post CABG x4, COPD, depression, CVA , breast cancer, hypertension who presents with about a week of shortness of breath, orthopnea.             She states she has had about a month of increased leg swelling, 2 weeks of fatigue and 1 week of shortness of breath as above. She was checking her home pulse ox back on the recommendation of her doctor and found that she had desatted to 85% at home.  She called her doctor who recommended that she go to the ED to be evaluated.  Patient continued to have saturations in the mid to upper 80s which improved with 2 L of oxygen.             Of note, Patient is unvaccinated and her daughter was positive for Covid 2 weeks ago.  She lives with her daughter. Patient denies fevers, chills, chest pain, dental pain, constipation, diarrhea, nausea, vomiting.  ED Course: Vital signs in the ED significant for requiring 2 L to maintain saturations, tachypnea in the 20s, heart rate in the 90s to low 100s.  Lab work-up showed BMP with glucose 132.  CBC with hemoglobin stable 15.6 and platelets stable at 544.  BMP normal.  Troponin pending.  Respiratory panel for flu and Covid positive for Covid.  Imaging showed chest x-ray with patchy right lung infiltrates.  Patient started on steroids and remdesivir in ED.  Assessment & Plan:   Principal Problem:   Acute hypoxemic respiratory failure due to COVID-19 [facility]) Active Problems:   Hypertension   Hx of breast cancer   CAD (coronary artery disease)   COPD (chronic obstructive pulmonary disease) (HCC)   History of CVA (cerebrovascular accident) without residual deficits   COVID-19   Acute proximal respiratory failure due to COVID-19 > Noted to be covid pos > in the setting of ongoing shortness of breath and daughter with recent  positive.  Unvaccinated. >Requiring 2.5 L to maintain saturations >patchy opacities were noted in R lung fields on chest x-ray -Continue on Decadron as tolerated -Continue on remdesivir -Zinc, vitamin C -Cont PRN bronchodilator - cont to follow serial inflammatory markers. CRP is down to 2.3  COPD - Continue home to troponin, Breo, albuterol - cont with PRN Combivent as needed  CAD status post CABG x4 - Cont wtih home aspirin, atorvastatin, lisinopril  History of CVA with resolution of speech deficits - Continue home statin as pt tolerates  Hypertension - Continue home lisinopril -BP currently stable and controled  History of breast cancer -Pt is followed by Dr. Alen Blew as outpatient  DVT prophylaxis: Lovenox subq Code Status: DNR Family Communication: Pt in room, family not at bedside  Status is: Inpatient  Will require inpatient stay because of: Unsafe d/c plan and Inpatient level of care appropriate due to severity of illness  Dispo: The patient is from: Home              Anticipated d/c is to: Home              Anticipated d/c date is: 2 days              Patient currently is not medically stable to d/c.   Difficult to place patient No   Consultants:     Procedures:  Antimicrobials: Anti-infectives (From admission, onward)   Start     Dose/Rate Route Frequency Ordered Stop   06/16/20 1000  remdesivir 100 mg in sodium chloride 0.9 % 100 mL IVPB       "Followed by" Linked Group Details   100 mg 200 mL/hr over 30 Minutes Intravenous Daily 06/15/20 1828 06/20/20 0959   06/15/20 1830  remdesivir 200 mg in sodium chloride 0.9% 250 mL IVPB       "Followed by" Linked Group Details   200 mg 580 mL/hr over 30 Minutes Intravenous Once 06/15/20 1828 06/15/20 2213      Subjective: Reports feeling sob this AM  Objective: Vitals:   06/16/20 2011 06/17/20 0100 06/17/20 0441 06/17/20 1349  BP: (!) 120/59 113/62 110/66 92/66  Pulse: 96  73 89   Resp: 18  20 19   Temp: (!) 97.5 F (36.4 C) 97.8 F (36.6 C) 98.3 F (36.8 C) 97.6 F (36.4 C)  TempSrc: Oral Oral Oral Oral  SpO2: 93% 96% 95% 92%  Weight: 75.2 kg  75.6 kg   Height: 5' (1.524 m)       Intake/Output Summary (Last 24 hours) at 06/17/2020 1735 Last data filed at 06/16/2020 2100 Gross per 24 hour  Intake 120 ml  Output --  Net 120 ml   Filed Weights   06/16/20 2011 06/17/20 0441  Weight: 75.2 kg 75.6 kg    Examination: General exam: Awake, laying in bed, in nad Respiratory system: Normal respiratory effort, no wheezing Cardiovascular system: regular rate, s1, s2 Gastrointestinal system: Soft, nondistended, positive BS Central nervous system: CN2-12 grossly intact, strength intact Extremities: Perfused, no clubbing Skin: Normal skin turgor, no notable skin lesions seen Psychiatry: Mood normal // no visual hallucinations   Data Reviewed: I have personally reviewed following labs and imaging studies  CBC: Recent Labs  Lab 06/15/20 1309 06/16/20 0704 06/17/20 0519  WBC 8.2 5.3 15.7*  NEUTROABS  --  4.0 12.8*  HGB 15.6* 14.4 15.3*  HCT 45.6 43.4 45.9  MCV 89.2 89.5 89.5  PLT 544* 510* 629*   Basic Metabolic Panel: Recent Labs  Lab 06/15/20 1309 06/16/20 0704 06/17/20 0519  NA 138 135 138  K 3.7 3.9 4.3  CL 105 101 104  CO2 22 22 21*  GLUCOSE 132* 171* 126*  BUN 9 14 25*  CREATININE 0.92 0.92 0.98  CALCIUM 9.8 10.0 10.6*  MG  --  1.8 2.1  PHOS  --  3.1 3.4   GFR: Estimated Creatinine Clearance: 40.9 mL/min (by C-G formula based on SCr of 0.98 mg/dL). Liver Function Tests: Recent Labs  Lab 06/16/20 0704 06/17/20 0519  AST 32 42*  ALT 30 38  ALKPHOS 102 103  BILITOT 0.8 0.5  PROT 6.3* 6.2*  ALBUMIN 2.7* 2.9*   No results for input(s): LIPASE, AMYLASE in the last 168 hours. No results for input(s): AMMONIA in the last 168 hours. Coagulation Profile: No results for input(s): INR, PROTIME in the last 168 hours. Cardiac  Enzymes: No results for input(s): CKTOTAL, CKMB, CKMBINDEX, TROPONINI in the last 168 hours. BNP (last 3 results) No results for input(s): PROBNP in the last 8760 hours. HbA1C: No results for input(s): HGBA1C in the last 72 hours. CBG: No results for input(s): GLUCAP in the last 168 hours. Lipid Profile: No results for input(s): CHOL, HDL, LDLCALC, TRIG, CHOLHDL, LDLDIRECT in the last 72 hours. Thyroid Function Tests: No results for input(s): TSH, T4TOTAL, FREET4, T3FREE, THYROIDAB in the last 72 hours. Anemia Panel:  Recent Labs    06/16/20 0704 06/17/20 0519  FERRITIN 607* 564*   Sepsis Labs: Recent Labs  Lab 06/15/20 2104  PROCALCITON <0.10    Recent Results (from the past 240 hour(s))  Urine Culture     Status: Abnormal   Collection Time: 06/14/20  3:09 PM   Specimen: Urine  Result Value Ref Range Status   MICRO NUMBER: [id number]  Final   SPECIMEN QUALITY: Adequate  Final   Sample Source URINE, CLEAN CATCH  Final   STATUS: FINAL  Final   ISOLATE 1: Proteus mirabilis (A)  Final    Comment: Greater than 100,000 CFU/mL of Proteus mirabilis      Susceptibility   Proteus mirabilis - URINE CULTURE, REFLEX    AMOX/CLAVULANIC <=2 Sensitive     AMPICILLIN <=2 Sensitive     AMPICILLIN/SULBACTAM <=2 Sensitive     CEFAZOLIN* <=4 Not Reportable      * For infections other than uncomplicated UTIcaused by E. coli, K. pneumoniae or P. mirabilis:Cefazolin is resistant if MIC > or = 8 mcg/mL.(Distinguishing susceptible versus intermediatefor isolates with MIC < or = 4 mcg/mL requiresadditional testing.)For uncomplicated UTI caused by E. coli,K. pneumoniae or P. mirabilis: Cefazolin issusceptible if MIC <32 mcg/mL and predictssusceptible to the oral agents cefaclor, cefdinir,cefpodoxime, cefprozil, cefuroxime, cephalexinand loracarbef.    CEFEPIME <=1 Sensitive     CEFTRIAXONE <=1 Sensitive     CIPROFLOXACIN <=0.25 Sensitive     LEVOFLOXACIN <=0.12 Sensitive     ERTAPENEM <=0.5  Sensitive     GENTAMICIN <=1 Sensitive     NITROFURANTOIN 128 Resistant     PIP/TAZO <=4 Sensitive     TOBRAMYCIN <=1 Sensitive     TRIMETH/SULFA* <=20 Sensitive      * For infections other than uncomplicated UTIcaused by E. coli, K. pneumoniae or P. mirabilis:Cefazolin is resistant if MIC > or = 8 mcg/mL.(Distinguishing susceptible versus intermediatefor isolates with MIC < or = 4 mcg/mL requiresadditional testing.)For uncomplicated UTI caused by E. coli,K. pneumoniae or P. mirabilis: Cefazolin issusceptible if MIC <32 mcg/mL and predictssusceptible to the oral agents cefaclor, cefdinir,cefpodoxime, cefprozil, cefuroxime, cephalexinand loracarbef.Legend:S = Susceptible  I = IntermediateR = Resistant  NS = Not susceptible* = Not tested  NR = Not reported**NN = See antimicrobic comments  SARS Coronavirus 2 by RT PCR (hospital order, performed in [place] hospital lab) Nasopharyngeal Nasopharyngeal Swab     Status: Abnormal   Collection Time: 06/15/20  4:54 PM   Specimen: Nasopharyngeal Swab  Result Value Ref Range Status   SARS Coronavirus 2 POSITIVE (A) NEGATIVE Final    Comment: RESULT CALLED TO, READ BACK BY AND VERIFIED WITH: Shirline Frees RN [id number] 06/15/20 A BROWNING (NOTE) SARS-CoV-2 target nucleic acids are DETECTED  SARS-CoV-2 RNA is generally detectable in upper respiratory specimens  during the acute phase of infection.  Positive results are indicative  of the presence of the identified virus, but do not rule out bacterial infection or co-infection with other pathogens not detected by the test.  Clinical correlation with patient history and  other diagnostic information is necessary to determine patient infection status.  The expected result is negative.  Fact Sheet for Patients:   StrictlyIdeas.no   Fact Sheet for Healthcare Providers:   BankingDealers.co.za    This test is not yet approved or cleared by the [place] FDA and   has been authorized for detection and/or diagnosis of SARS-CoV-2 by FDA under an Emergency Use Authorization (EUA).  This EUA will remain in effect (  meaning this t est can be used) for the duration of  the COVID-19 declaration under Section 564(b)(1) of the Act, 21 U.S.C. section 360-bbb-3(b)(1), unless the authorization is terminated or revoked sooner.  Performed at [facility], [place] [address], [place], [place] [address]      Radiology Studies: No results found.  Scheduled Meds: . vitamin C  500 mg Oral Daily  . aspirin  81 mg Oral Daily  . atorvastatin  40 mg Oral Daily  . dexamethasone (DECADRON) injection  6 mg Intravenous Q24H  . enoxaparin (LOVENOX) injection  40 mg Subcutaneous Q24H  . fluticasone furoate-vilanterol  1 puff Inhalation Daily  . lisinopril  20 mg Oral Daily  . mouth rinse  15 mL Mouth Rinse BID  . sodium chloride flush  3 mL Intravenous Q12H  . umeclidinium bromide  1 puff Inhalation Daily  . zinc sulfate  220 mg Oral Daily   Continuous Infusions: . remdesivir 100 mg in NS 100 mL 100 mg (06/17/20 0848)     LOS: 1 day   Marylu Lund, MD [facility] Pager On Amion  If 7PM-7AM, please contact night-coverage 06/17/2020, 5:35 PM

## 2020-06-18 DIAGNOSIS — Z853 Personal history of malignant neoplasm of breast: Secondary | ICD-10-CM

## 2020-06-18 LAB — CBC WITH DIFFERENTIAL/PLATELET
Abs Immature Granulocytes: 0.12 10*3/uL — ABNORMAL HIGH (ref 0.00–0.07)
Basophils Absolute: 0 10*3/uL (ref 0.0–0.1)
Basophils Relative: 0 %
Eosinophils Absolute: 0 10*3/uL (ref 0.0–0.5)
Eosinophils Relative: 0 %
HCT: 39.8 % (ref 36.0–46.0)
Hemoglobin: 13.4 g/dL (ref 12.0–15.0)
Immature Granulocytes: 1 %
Lymphocytes Relative: 13 %
Lymphs Abs: 1.7 10*3/uL (ref 0.7–4.0)
MCH: 29.8 pg (ref 26.0–34.0)
MCHC: 33.7 g/dL (ref 30.0–36.0)
MCV: 88.6 fL (ref 80.0–100.0)
Monocytes Absolute: 1 10*3/uL (ref 0.1–1.0)
Monocytes Relative: 7 %
Neutro Abs: 10.6 10*3/uL — ABNORMAL HIGH (ref 1.7–7.7)
Neutrophils Relative %: 79 %
Platelets: 690 10*3/uL — ABNORMAL HIGH (ref 150–400)
RBC: 4.49 MIL/uL (ref 3.87–5.11)
RDW: 14.4 % (ref 11.5–15.5)
WBC: 13.4 10*3/uL — ABNORMAL HIGH (ref 4.0–10.5)
nRBC: 0 % (ref 0.0–0.2)

## 2020-06-18 LAB — COMPREHENSIVE METABOLIC PANEL
ALT: 34 U/L (ref 0–44)
AST: 31 U/L (ref 15–41)
Albumin: 2.5 g/dL — ABNORMAL LOW (ref 3.5–5.0)
Alkaline Phosphatase: 84 U/L (ref 38–126)
Anion gap: 7 (ref 5–15)
BUN: 24 mg/dL — ABNORMAL HIGH (ref 8–23)
CO2: 22 mmol/L (ref 22–32)
Calcium: 9.9 mg/dL (ref 8.9–10.3)
Chloride: 108 mmol/L (ref 98–111)
Creatinine, Ser: 0.93 mg/dL (ref 0.44–1.00)
GFR, Estimated: >60 mL/min (ref >60)
Glucose, Bld: 122 mg/dL — ABNORMAL HIGH (ref 70–99)
Potassium: 4.3 mmol/L (ref 3.5–5.1)
Sodium: 137 mmol/L (ref 135–145)
Total Bilirubin: 0.6 mg/dL (ref 0.3–1.2)
Total Protein: 5.4 g/dL — ABNORMAL LOW (ref 6.5–8.1)

## 2020-06-18 LAB — FERRITIN: Ferritin: 493 ng/mL — ABNORMAL HIGH (ref 11–307)

## 2020-06-18 LAB — C-REACTIVE PROTEIN: CRP: 0.9 mg/dL (ref <1.0)

## 2020-06-18 LAB — MAGNESIUM: Magnesium: 1.9 mg/dL (ref 1.7–2.4)

## 2020-06-18 LAB — D-DIMER, QUANTITATIVE: D-Dimer, Quant: 0.77 ug/mL-FEU — ABNORMAL HIGH (ref 0.00–0.50)

## 2020-06-18 LAB — PHOSPHORUS: Phosphorus: 3.2 mg/dL (ref 2.5–4.6)

## 2020-06-18 NOTE — Plan of Care (Signed)
  Problem: Clinical Measurements: Goal: Will remain free from infection Outcome: Progressing   Problem: Activity: Goal: Risk for activity intolerance will decrease Outcome: Progressing   Problem: Nutrition: Goal: Adequate nutrition will be maintained Outcome: Progressing   Problem: Coping: Goal: Level of anxiety will decrease Outcome: Progressing   Problem: Pain Managment: Goal: General experience of comfort will improve Outcome: Progressing   Problem: Safety: Goal: Ability to remain free from injury will improve Outcome: Progressing   Problem: Skin Integrity: Goal: Risk for impaired skin integrity will decrease Outcome: Progressing   Problem: Education: Goal: Knowledge of risk factors and measures for prevention of condition will improve Outcome: Progressing   Problem: Respiratory: Goal: Complications related to the disease process, condition or treatment will be avoided or minimized Outcome: Progressing   Problem: Activity: Goal: Ability to tolerate increased activity will improve Outcome: Progressing Goal: Will verbalize the importance of balancing activity with adequate rest periods Outcome: Progressing

## 2020-06-18 NOTE — Progress Notes (Signed)
PROGRESS NOTE    Nancy Merritt  [id number] DOB: 06-10-1940 DOA: 06/15/2020 PCP: Lesleigh Noe, MD    Brief Narrative:  80 y.o. female with medical history significant of CAD status post CABG x4, COPD, depression, CVA , breast cancer, hypertension who presents with about a week of shortness of breath, orthopnea.             She states she has had about a month of increased leg swelling, 2 weeks of fatigue and 1 week of shortness of breath as above. She was checking her home pulse ox back on the recommendation of her doctor and found that she had desatted to 85% at home.  She called her doctor who recommended that she go to the ED to be evaluated.  Patient continued to have saturations in the mid to upper 80s which improved with 2 L of oxygen.             Of note, Patient is unvaccinated and her daughter was positive for Covid 2 weeks ago.  She lives with her daughter. Patient denies fevers, chills, chest pain, dental pain, constipation, diarrhea, nausea, vomiting.  ED Course: Vital signs in the ED significant for requiring 2 L to maintain saturations, tachypnea in the 20s, heart rate in the 90s to low 100s.  Lab work-up showed BMP with glucose 132.  CBC with hemoglobin stable 15.6 and platelets stable at 544.  BMP normal.  Troponin pending.  Respiratory panel for flu and Covid positive for Covid.  Imaging showed chest x-ray with patchy right lung infiltrates.  Patient started on steroids and remdesivir in ED.  Assessment & Plan:   Principal Problem:   Acute hypoxemic respiratory failure due to COVID-19 [facility]) Active Problems:   Hypertension   Hx of breast cancer   CAD (coronary artery disease)   COPD (chronic obstructive pulmonary disease) (HCC)   History of CVA (cerebrovascular accident) without residual deficits   COVID-19   Acute proximal respiratory failure due to COVID-19 > Noted to be covid pos > in the setting of ongoing shortness of breath and daughter with recent  positive.  Unvaccinated. >Weaned to [facility], cont to wean O2 as tolerated >patchy opacities were noted in R lung fields on chest x-ray -Continue on Decadron as tolerated -Continue on remdesivir -Zinc, vitamin C -Cont PRN bronchodilator - cont to follow serial inflammatory markers. CRP is down to 0.9  COPD - Continue home to troponin, Breo, albuterol - cont with PRN Combivent PRN  CAD status post CABG x4 - Cont wtih home aspirin, atorvastatin, lisinopril  History of CVA with resolution of speech deficits - Continue home statin as pt tolerates  Hypertension - Continue home lisinopril as tolerated -BP currently stable and controled  History of breast cancer -Pt is followed by Dr. Alen Blew as outpatient  DVT prophylaxis: Lovenox subq Code Status: DNR Family Communication: Pt in room, family not at bedside  Status is: Inpatient  Will require inpatient stay because of: Unsafe d/c plan and Inpatient level of care appropriate due to severity of illness  Dispo: The patient is from: Home              Anticipated d/c is to: Home              Anticipated d/c date is: 2 days              Patient currently is not medically stable to d/c.   Difficult to place patient No   Consultants:  Procedures:     Antimicrobials: Anti-infectives (From admission, onward)   Start     Dose/Rate Route Frequency Ordered Stop   06/16/20 1000  remdesivir 100 mg in sodium chloride 0.9 % 100 mL IVPB       "Followed by" Linked Group Details   100 mg 200 mL/hr over 30 Minutes Intravenous Daily 06/15/20 1828 06/20/20 0959   06/15/20 1830  remdesivir 200 mg in sodium chloride 0.9% 250 mL IVPB       "Followed by" Linked Group Details   200 mg 580 mL/hr over 30 Minutes Intravenous Once 06/15/20 1828 06/15/20 2213      Subjective: States feeling better today  Objective: Vitals:   06/17/20 1349 06/17/20 2006 06/18/20 0400 06/18/20 1412  BP: 92/66 117/60 115/64 (!) 129/116  Pulse: 89  90 78 95  Resp: 19 17 18 19   Temp: 97.6 F (36.4 C) 97.8 F (36.6 C) 97.9 F (36.6 C) 97.6 F (36.4 C)  TempSrc: Oral Oral Axillary Oral  SpO2: 92% 92% 95% 96%  Weight:      Height:       No intake or output data in the 24 hours ending 06/18/20 1829 Filed Weights   06/16/20 2011 06/17/20 0441  Weight: 75.2 kg 75.6 kg    Examination: General exam: Conversant, in no acute distress Respiratory system: normal chest rise, clear, no audible wheezing Cardiovascular system: regular rhythm, s1-s2 Gastrointestinal system: Nondistended, nontender, pos BS Central nervous system: No seizures, no tremors Extremities: No cyanosis, no joint deformities Skin: No rashes, no pallor Psychiatry: Affect normal // no auditory hallucinations   Data Reviewed: I have personally reviewed following labs and imaging studies  CBC: Recent Labs  Lab 06/15/20 1309 06/16/20 0704 06/17/20 0519 06/18/20 0405  WBC 8.2 5.3 15.7* 13.4*  NEUTROABS  --  4.0 12.8* 10.6*  HGB 15.6* 14.4 15.3* 13.4  HCT 45.6 43.4 45.9 39.8  MCV 89.2 89.5 89.5 88.6  PLT 544* 510* 740* [id number]*   Basic Metabolic Panel: Recent Labs  Lab 06/15/20 1309 06/16/20 0704 06/17/20 0519 06/18/20 0405  NA 138 135 138 137  K 3.7 3.9 4.3 4.3  CL 105 101 104 108  CO2 22 22 21* 22  GLUCOSE 132* 171* 126* 122*  BUN 9 14 25* 24*  CREATININE 0.92 0.92 0.98 0.93  CALCIUM 9.8 10.0 10.6* 9.9  MG  --  1.8 2.1 1.9  PHOS  --  3.1 3.4 3.2   GFR: Estimated Creatinine Clearance: 43.1 mL/min (by C-G formula based on SCr of 0.93 mg/dL). Liver Function Tests: Recent Labs  Lab 06/16/20 0704 06/17/20 0519 06/18/20 0405  AST 32 42* 31  ALT 30 38 34  ALKPHOS 102 103 84  BILITOT 0.8 0.5 0.6  PROT 6.3* 6.2* 5.4*  ALBUMIN 2.7* 2.9* 2.5*   No results for input(s): LIPASE, AMYLASE in the last 168 hours. No results for input(s): AMMONIA in the last 168 hours. Coagulation Profile: No results for input(s): INR, PROTIME in the last 168  hours. Cardiac Enzymes: No results for input(s): CKTOTAL, CKMB, CKMBINDEX, TROPONINI in the last 168 hours. BNP (last 3 results) No results for input(s): PROBNP in the last 8760 hours. HbA1C: No results for input(s): HGBA1C in the last 72 hours. CBG: No results for input(s): GLUCAP in the last 168 hours. Lipid Profile: No results for input(s): CHOL, HDL, LDLCALC, TRIG, CHOLHDL, LDLDIRECT in the last 72 hours. Thyroid Function Tests: No results for input(s): TSH, T4TOTAL, FREET4, T3FREE, THYROIDAB in the last  72 hours. Anemia Panel: Recent Labs    06/17/20 0519 06/18/20 0405  FERRITIN 564* 493*   Sepsis Labs: Recent Labs  Lab 06/15/20 2104  PROCALCITON <0.10    Recent Results (from the past 240 hour(s))  Urine Culture     Status: Abnormal   Collection Time: 06/14/20  3:09 PM   Specimen: Urine  Result Value Ref Range Status   MICRO NUMBER: [id number]  Final   SPECIMEN QUALITY: Adequate  Final   Sample Source URINE, CLEAN CATCH  Final   STATUS: FINAL  Final   ISOLATE 1: Proteus mirabilis (A)  Final    Comment: Greater than 100,000 CFU/mL of Proteus mirabilis      Susceptibility   Proteus mirabilis - URINE CULTURE, REFLEX    AMOX/CLAVULANIC <=2 Sensitive     AMPICILLIN <=2 Sensitive     AMPICILLIN/SULBACTAM <=2 Sensitive     CEFAZOLIN* <=4 Not Reportable      * For infections other than uncomplicated UTIcaused by E. coli, K. pneumoniae or P. mirabilis:Cefazolin is resistant if MIC > or = 8 mcg/mL.(Distinguishing susceptible versus intermediatefor isolates with MIC < or = 4 mcg/mL requiresadditional testing.)For uncomplicated UTI caused by E. coli,K. pneumoniae or P. mirabilis: Cefazolin issusceptible if MIC <32 mcg/mL and predictssusceptible to the oral agents cefaclor, cefdinir,cefpodoxime, cefprozil, cefuroxime, cephalexinand loracarbef.    CEFEPIME <=1 Sensitive     CEFTRIAXONE <=1 Sensitive     CIPROFLOXACIN <=0.25 Sensitive     LEVOFLOXACIN <=0.12 Sensitive      ERTAPENEM <=0.5 Sensitive     GENTAMICIN <=1 Sensitive     NITROFURANTOIN 128 Resistant     PIP/TAZO <=4 Sensitive     TOBRAMYCIN <=1 Sensitive     TRIMETH/SULFA* <=20 Sensitive      * For infections other than uncomplicated UTIcaused by E. coli, K. pneumoniae or P. mirabilis:Cefazolin is resistant if MIC > or = 8 mcg/mL.(Distinguishing susceptible versus intermediatefor isolates with MIC < or = 4 mcg/mL requiresadditional testing.)For uncomplicated UTI caused by E. coli,K. pneumoniae or P. mirabilis: Cefazolin issusceptible if MIC <32 mcg/mL and predictssusceptible to the oral agents cefaclor, cefdinir,cefpodoxime, cefprozil, cefuroxime, cephalexinand loracarbef.Legend:S = Susceptible  I = IntermediateR = Resistant  NS = Not susceptible* = Not tested  NR = Not reported**NN = See antimicrobic comments  SARS Coronavirus 2 by RT PCR (hospital order, performed in [place] hospital lab) Nasopharyngeal Nasopharyngeal Swab     Status: Abnormal   Collection Time: 06/15/20  4:54 PM   Specimen: Nasopharyngeal Swab  Result Value Ref Range Status   SARS Coronavirus 2 POSITIVE (A) NEGATIVE Final    Comment: RESULT CALLED TO, READ BACK BY AND VERIFIED WITH: Shirline Frees RN [id number] 06/15/20 A BROWNING (NOTE) SARS-CoV-2 target nucleic acids are DETECTED  SARS-CoV-2 RNA is generally detectable in upper respiratory specimens  during the acute phase of infection.  Positive results are indicative  of the presence of the identified virus, but do not rule out bacterial infection or co-infection with other pathogens not detected by the test.  Clinical correlation with patient history and  other diagnostic information is necessary to determine patient infection status.  The expected result is negative.  Fact Sheet for Patients:   StrictlyIdeas.no   Fact Sheet for Healthcare Providers:   BankingDealers.co.za    This test is not yet approved or cleared by the [place] FDA and  has been authorized for detection and/or diagnosis of SARS-CoV-2 by FDA under an Emergency Use Authorization (EUA).  This EUA  will remain in effect (meaning this t est can be used) for the duration of  the COVID-19 declaration under Section 564(b)(1) of the Act, 21 U.S.C. section 360-bbb-3(b)(1), unless the authorization is terminated or revoked sooner.  Performed at [facility], [place] [address], [place], [place] [address]      Radiology Studies: No results found.  Scheduled Meds: . vitamin C  500 mg Oral Daily  . aspirin  81 mg Oral Daily  . atorvastatin  40 mg Oral Daily  . dexamethasone (DECADRON) injection  6 mg Intravenous Q24H  . enoxaparin (LOVENOX) injection  40 mg Subcutaneous Q24H  . fluticasone furoate-vilanterol  1 puff Inhalation Daily  . lisinopril  20 mg Oral Daily  . mouth rinse  15 mL Mouth Rinse BID  . sodium chloride flush  3 mL Intravenous Q12H  . umeclidinium bromide  1 puff Inhalation Daily  . zinc sulfate  220 mg Oral Daily   Continuous Infusions: . remdesivir 100 mg in NS 100 mL 100 mg (06/18/20 0911)     LOS: 2 days   Marylu Lund, MD [facility] Pager On Amion  If 7PM-7AM, please contact night-coverage 06/18/2020, 6:29 PM

## 2020-06-19 LAB — CBC WITH DIFFERENTIAL/PLATELET
Abs Immature Granulocytes: 0.11 10*3/uL — ABNORMAL HIGH (ref 0.00–0.07)
Basophils Absolute: 0 10*3/uL (ref 0.0–0.1)
Basophils Relative: 0 %
Eosinophils Absolute: 0 10*3/uL (ref 0.0–0.5)
Eosinophils Relative: 0 %
HCT: 45.7 % (ref 36.0–46.0)
Hemoglobin: 15.1 g/dL — ABNORMAL HIGH (ref 12.0–15.0)
Immature Granulocytes: 1 %
Lymphocytes Relative: 17 %
Lymphs Abs: 2.1 10*3/uL (ref 0.7–4.0)
MCH: 29.8 pg (ref 26.0–34.0)
MCHC: 33 g/dL (ref 30.0–36.0)
MCV: 90.1 fL (ref 80.0–100.0)
Monocytes Absolute: 1 10*3/uL (ref 0.1–1.0)
Monocytes Relative: 8 %
Neutro Abs: 9.5 10*3/uL — ABNORMAL HIGH (ref 1.7–7.7)
Neutrophils Relative %: 74 %
Platelets: 838 10*3/uL — ABNORMAL HIGH (ref 150–400)
RBC: 5.07 MIL/uL (ref 3.87–5.11)
RDW: 14.5 % (ref 11.5–15.5)
WBC: 12.7 10*3/uL — ABNORMAL HIGH (ref 4.0–10.5)
nRBC: 0 % (ref 0.0–0.2)

## 2020-06-19 LAB — COMPREHENSIVE METABOLIC PANEL
ALT: 37 U/L (ref 0–44)
AST: 31 U/L (ref 15–41)
Albumin: 2.8 g/dL — ABNORMAL LOW (ref 3.5–5.0)
Alkaline Phosphatase: 112 U/L (ref 38–126)
Anion gap: 8 (ref 5–15)
BUN: 26 mg/dL — ABNORMAL HIGH (ref 8–23)
CO2: 23 mmol/L (ref 22–32)
Calcium: 10.4 mg/dL — ABNORMAL HIGH (ref 8.9–10.3)
Chloride: 105 mmol/L (ref 98–111)
Creatinine, Ser: 0.94 mg/dL (ref 0.44–1.00)
GFR, Estimated: >60 mL/min (ref >60)
Glucose, Bld: 150 mg/dL — ABNORMAL HIGH (ref 70–99)
Potassium: 4.6 mmol/L (ref 3.5–5.1)
Sodium: 136 mmol/L (ref 135–145)
Total Bilirubin: 0.4 mg/dL (ref 0.3–1.2)
Total Protein: 6 g/dL — ABNORMAL LOW (ref 6.5–8.1)

## 2020-06-19 LAB — D-DIMER, QUANTITATIVE: D-Dimer, Quant: 0.73 ug/mL-FEU — ABNORMAL HIGH (ref 0.00–0.50)

## 2020-06-19 LAB — C-REACTIVE PROTEIN: CRP: 0.6 mg/dL (ref <1.0)

## 2020-06-19 LAB — FERRITIN: Ferritin: 550 ng/mL — ABNORMAL HIGH (ref 11–307)

## 2020-06-19 LAB — MAGNESIUM: Magnesium: 2.2 mg/dL (ref 1.7–2.4)

## 2020-06-19 LAB — PHOSPHORUS: Phosphorus: 2.9 mg/dL (ref 2.5–4.6)

## 2020-06-19 NOTE — Progress Notes (Signed)
PROGRESS NOTE    Nancy Merritt  VOH:[id number] DOB: [birth year] DOA: 06/15/2020 PCP: Lesleigh Noe, MD    Brief Narrative:  80 y.o. female with medical history significant of CAD status post CABG x4, COPD, depression, CVA , breast cancer, hypertension who presents with about a week of shortness of breath, orthopnea.             She states she has had about a month of increased leg swelling, 2 weeks of fatigue and 1 week of shortness of breath as above. She was checking her home pulse ox back on the recommendation of her doctor and found that she had desatted to 85% at home.  She called her doctor who recommended that she go to the ED to be evaluated.  Patient continued to have saturations in the mid to upper 80s which improved with 2 L of oxygen.             Of note, Patient is unvaccinated and her daughter was positive for Covid 2 weeks ago.  She lives with her daughter. Patient denies fevers, chills, chest pain, dental pain, constipation, diarrhea, nausea, vomiting.  ED Course: Vital signs in the ED significant for requiring 2 L to maintain saturations, tachypnea in the 20s, heart rate in the 90s to low 100s.  Lab work-up showed BMP with glucose 132.  CBC with hemoglobin stable 15.6 and platelets stable at 544.  BMP normal.  Troponin pending.  Respiratory panel for flu and Covid positive for Covid.  Imaging showed chest x-ray with patchy right lung infiltrates.  Patient started on steroids and remdesivir in ED.  Assessment & Plan:   Principal Problem:   Acute hypoxemic respiratory failure due to COVID-19 [facility]) Active Problems:   Hypertension   Hx of breast cancer   CAD (coronary artery disease)   COPD (chronic obstructive pulmonary disease) (HCC)   History of CVA (cerebrovascular accident) without residual deficits   COVID-19   Acute proximal respiratory failure due to COVID-19 > Noted to be covid pos > in the setting of ongoing shortness of breath and daughter with recent  positive.  Unvaccinated. >Weaned to [facility], cont to wean O2 as tolerated, still desaturates on ambulation, cont to wean o2 >patchy opacities were noted in R lung fields on chest x-ray -Continue on Decadron as tolerated -Continue on remdesivir -Zinc, vitamin C -Cont PRN bronchodilator - cont to follow serial inflammatory markers. CRP is down to 0.6  COPD - Continue home to troponin, Breo, albuterol - cont with PRN Combivent PRN  CAD status post CABG x4 - Cont wtih home aspirin, atorvastatin, lisinopril  History of CVA with resolution of speech deficits - Continue home statin as pt tolerates  Hypertension - Continue home lisinopril as tolerated -BP remains stable  History of breast cancer -Pt is followed by Dr. Alen Blew as outpatient  DVT prophylaxis: Lovenox subq Code Status: DNR Family Communication: Pt in room, family not at bedside  Status is: Inpatient  Will require inpatient stay because of: Unsafe d/c plan and Inpatient level of care appropriate due to severity of illness  Dispo: The patient is from: Home              Anticipated d/c is to: Home              Anticipated d/c date is: 2 days              Patient currently is not medically stable to d/c.   Difficult to place  patient No   Consultants:     Procedures:     Antimicrobials: Anti-infectives (From admission, onward)   Start     Dose/Rate Route Frequency Ordered Stop   06/16/20 1000  remdesivir 100 mg in sodium chloride 0.9 % 100 mL IVPB       "Followed by" Linked Group Details   100 mg 200 mL/hr over 30 Minutes Intravenous Daily 06/15/20 1828 06/19/20 0942   06/15/20 1830  remdesivir 200 mg in sodium chloride 0.9% 250 mL IVPB       "Followed by" Linked Group Details   200 mg 580 mL/hr over 30 Minutes Intravenous Once 06/15/20 1828 06/15/20 2213      Subjective: Feeling better  Objective: Vitals:   06/18/20 1412 06/18/20 2156 06/19/20 0900 06/19/20 1300  BP: (!) 129/116 (!) 128/56   (!) 101/50  Pulse: 95 77  92  Resp: 19 18  17   Temp: 97.6 F (36.4 C) 97.9 F (36.6 C)  98.4 F (36.9 C)  TempSrc: Oral Oral  Oral  SpO2: 96% 96% 96% 95%  Weight:      Height:        Intake/Output Summary (Last 24 hours) at 06/19/2020 1735 Last data filed at 06/19/2020 1343 Gross per 24 hour  Intake 240 ml  Output --  Net 240 ml   Filed Weights   06/16/20 2011 06/17/20 0441  Weight: 75.2 kg 75.6 kg    Examination: General exam: Awake, laying in bed, in nad Respiratory system: Normal respiratory effort, no wheezing Cardiovascular system: regular rate, s1, s2 Gastrointestinal system: Soft, nondistended, positive BS Central nervous system: CN2-12 grossly intact, strength intact Extremities: Perfused, no clubbing Skin: Normal skin turgor, no notable skin lesions seen Psychiatry: Mood normal // no visual hallucinations   Data Reviewed: I have personally reviewed following labs and imaging studies  CBC: Recent Labs  Lab 06/15/20 1309 06/16/20 0704 06/17/20 0519 06/18/20 0405 06/19/20 0324  WBC 8.2 5.3 15.7* 13.4* 12.7*  NEUTROABS  --  4.0 12.8* 10.6* 9.5*  HGB 15.6* 14.4 15.3* 13.4 15.1*  HCT 45.6 43.4 45.9 39.8 45.7  MCV 89.2 89.5 89.5 88.6 90.1  PLT 544* 510* 740* 690* 0000000*   Basic Metabolic Panel: Recent Labs  Lab 06/15/20 1309 06/16/20 0704 06/17/20 0519 06/18/20 0405 06/19/20 0324  NA 138 135 138 137 136  K 3.7 3.9 4.3 4.3 4.6  CL 105 101 104 108 105  CO2 22 22 21* 22 23  GLUCOSE 132* 171* 126* 122* 150*  BUN 9 14 25* 24* 26*  CREATININE 0.92 0.92 0.98 0.93 0.94  CALCIUM 9.8 10.0 10.6* 9.9 10.4*  MG  --  1.8 2.1 1.9 2.2  PHOS  --  3.1 3.4 3.2 2.9   GFR: Estimated Creatinine Clearance: 42.6 mL/min (by C-G formula based on SCr of 0.94 mg/dL). Liver Function Tests: Recent Labs  Lab 06/16/20 0704 06/17/20 0519 06/18/20 0405 06/19/20 0324  AST 32 42* 31 31  ALT 30 38 34 37  ALKPHOS 102 103 84 112  BILITOT 0.8 0.5 0.6 0.4  PROT 6.3* 6.2*  5.4* 6.0*  ALBUMIN 2.7* 2.9* 2.5* 2.8*   No results for input(s): LIPASE, AMYLASE in the last 168 hours. No results for input(s): AMMONIA in the last 168 hours. Coagulation Profile: No results for input(s): INR, PROTIME in the last 168 hours. Cardiac Enzymes: No results for input(s): CKTOTAL, CKMB, CKMBINDEX, TROPONINI in the last 168 hours. BNP (last 3 results) No results for input(s): PROBNP in the  last 8760 hours. HbA1C: No results for input(s): HGBA1C in the last 72 hours. CBG: No results for input(s): GLUCAP in the last 168 hours. Lipid Profile: No results for input(s): CHOL, HDL, LDLCALC, TRIG, CHOLHDL, LDLDIRECT in the last 72 hours. Thyroid Function Tests: No results for input(s): TSH, T4TOTAL, FREET4, T3FREE, THYROIDAB in the last 72 hours. Anemia Panel: Recent Labs    06/18/20 0405 06/19/20 0324  FERRITIN 493* 550*   Sepsis Labs: Recent Labs  Lab 06/15/20 2104  PROCALCITON <0.10    Recent Results (from the past 240 hour(s))  Urine Culture     Status: Abnormal   Collection Time: 06/14/20  3:09 PM   Specimen: Urine  Result Value Ref Range Status   MICRO NUMBER: [id number]  Final   SPECIMEN QUALITY: Adequate  Final   Sample Source URINE, CLEAN CATCH  Final   STATUS: FINAL  Final   ISOLATE 1: Proteus mirabilis (A)  Final    Comment: Greater than 100,000 CFU/mL of Proteus mirabilis      Susceptibility   Proteus mirabilis - URINE CULTURE, REFLEX    AMOX/CLAVULANIC <=2 Sensitive     AMPICILLIN <=2 Sensitive     AMPICILLIN/SULBACTAM <=2 Sensitive     CEFAZOLIN* <=4 Not Reportable      * For infections other than uncomplicated UTIcaused by E. coli, K. pneumoniae or P. mirabilis:Cefazolin is resistant if MIC > or = 8 mcg/mL.(Distinguishing susceptible versus intermediatefor isolates with MIC < or = 4 mcg/mL requiresadditional testing.)For uncomplicated UTI caused by E. coli,K. pneumoniae or P. mirabilis: Cefazolin issusceptible if MIC <32 mcg/mL and predictssusceptible  to the oral agents cefaclor, cefdinir,cefpodoxime, cefprozil, cefuroxime, cephalexinand loracarbef.    CEFEPIME <=1 Sensitive     CEFTRIAXONE <=1 Sensitive     CIPROFLOXACIN <=0.25 Sensitive     LEVOFLOXACIN <=0.12 Sensitive     ERTAPENEM <=0.5 Sensitive     GENTAMICIN <=1 Sensitive     NITROFURANTOIN 128 Resistant     PIP/TAZO <=4 Sensitive     TOBRAMYCIN <=1 Sensitive     TRIMETH/SULFA* <=20 Sensitive      * For infections other than uncomplicated UTIcaused by E. coli, K. pneumoniae or P. mirabilis:Cefazolin is resistant if MIC > or = 8 mcg/mL.(Distinguishing susceptible versus intermediatefor isolates with MIC < or = 4 mcg/mL requiresadditional testing.)For uncomplicated UTI caused by E. coli,K. pneumoniae or P. mirabilis: Cefazolin issusceptible if MIC <32 mcg/mL and predictssusceptible to the oral agents cefaclor, cefdinir,cefpodoxime, cefprozil, cefuroxime, cephalexinand loracarbef.Legend:S = Susceptible  I = IntermediateR = Resistant  NS = Not susceptible* = Not tested  NR = Not reported**NN = See antimicrobic comments  SARS Coronavirus 2 by RT PCR (hospital order, performed in [place] hospital lab) Nasopharyngeal Nasopharyngeal Swab     Status: Abnormal   Collection Time: 06/15/20  4:54 PM   Specimen: Nasopharyngeal Swab  Result Value Ref Range Status   SARS Coronavirus 2 POSITIVE (A) NEGATIVE Final    Comment: RESULT CALLED TO, READ BACK BY AND VERIFIED WITH: Shirline Frees RN 1817 06/15/20 A BROWNING (NOTE) SARS-CoV-2 target nucleic acids are DETECTED  SARS-CoV-2 RNA is generally detectable in upper respiratory specimens  during the acute phase of infection.  Positive results are indicative  of the presence of the identified virus, but do not rule out bacterial infection or co-infection with other pathogens not detected by the test.  Clinical correlation with patient history and  other diagnostic information is necessary to determine patient infection status.  The expected result  is  negative.  Fact Sheet for Patients:   StrictlyIdeas.no   Fact Sheet for Healthcare Providers:   BankingDealers.co.za    This test is not yet approved or cleared by the [place] FDA and  has been authorized for detection and/or diagnosis of SARS-CoV-2 by FDA under an Emergency Use Authorization (EUA).  This EUA will remain in effect (meaning this t est can be used) for the duration of  the COVID-19 declaration under Section 564(b)(1) of the Act, 21 U.S.C. section 360-bbb-3(b)(1), unless the authorization is terminated or revoked sooner.  Performed at [facility], [place] [address], [place], [place] [address]      Radiology Studies: No results found.  Scheduled Meds: . vitamin C  500 mg Oral Daily  . aspirin  81 mg Oral Daily  . atorvastatin  40 mg Oral Daily  . dexamethasone (DECADRON) injection  6 mg Intravenous Q24H  . enoxaparin (LOVENOX) injection  40 mg Subcutaneous Q24H  . fluticasone furoate-vilanterol  1 puff Inhalation Daily  . lisinopril  20 mg Oral Daily  . mouth rinse  15 mL Mouth Rinse BID  . sodium chloride flush  3 mL Intravenous Q12H  . umeclidinium bromide  1 puff Inhalation Daily  . zinc sulfate  220 mg Oral Daily   Continuous Infusions:    LOS: 3 days   Marylu Lund, MD [facility] Pager On Amion  If 7PM-7AM, please contact night-coverage 06/19/2020, 5:35 PM

## 2020-06-19 NOTE — Progress Notes (Signed)
Physical Therapy Treatment Patient Details Name: Nancy Merritt MRN: [id number] DOB: [birth year] Today's Date: 06/19/2020    History of Present Illness 80 y.o. female with medical history significant of CAD status post CABG x4, COPD, depression, CVA , breast cancer, hypertension who presented to the ED with about a week of shortness of breath, orthopnea.Admitted COVID+.    PT Comments    Pt demonstrates mod I bed mobility and transfers. SpO2 94% at rest on 2L in room. Ambulated in hallway x 2. First trial: pt amb 200' without AD supervision on RA, desat to 86%.  Returned to room and replaced 2L O2. 2-minute seated recovery to return to 90%. Second trial: pt amb 200' without AD min guard assist on 2L, SpO2 92%. Min guard provided second trial due to pt fatigue and reports of feeling unsteady. Max HR 105. Resting HR in 80s. Pt remained on 2L O2 in room at end of session.   Follow Up Recommendations  No PT follow up;Supervision for mobility/OOB     Equipment Recommendations  None recommended by PT    Recommendations for Other Services       Precautions / Restrictions Precautions Precautions: None    Mobility  Bed Mobility Overal bed mobility: Modified Independent                Transfers Overall transfer level: Modified independent Equipment used: None                Ambulation/Gait Ambulation/Gait assistance: Supervision;Min guard Gait Distance (Feet): 200 Feet (x 2) Assistive device: None Gait Pattern/deviations: WFL(Within Functional Limits) Gait velocity: WFL Gait velocity interpretation: >2.62 ft/sec, indicative of community ambulatory General Gait Details: Amb first trial on RA 200' supervision without AD. Desat to 86%. 2-minute seated recovery time for return to 90%. Amb second trial on 2L 200' min guard assist, occassional use of handrail. Increased from S to min guard 2nd trial due to pt reporting feeling unsteady. No LOB noted. No physical assist  needed.   Stairs             Wheelchair Mobility    Modified Rankin (Stroke Patients Only)       Balance Overall balance assessment: Mild deficits observed, not formally tested                                          Cognition Arousal/Alertness: Awake/alert Behavior During Therapy: WFL for tasks assessed/performed Overall Cognitive Status: Within Functional Limits for tasks assessed                                        Exercises      General Comments General comments (skin integrity, edema, etc.): Pt remained on 2L in room at end of session. SpO2 94%      Pertinent Vitals/Pain Pain Assessment: No/denies pain    Home Living                      Prior Function            PT Goals (current goals can now be found in the care plan section) Acute Rehab PT Goals Patient Stated Goal: home Progress towards PT goals: Progressing toward goals    Frequency    Min 3X/week  PT Plan Current plan remains appropriate    Co-evaluation              AM-PAC PT "6 Clicks" Mobility   Outcome Measure  Help needed turning from your back to your side while in a flat bed without using bedrails?: None Help needed moving from lying on your back to sitting on the side of a flat bed without using bedrails?: None Help needed moving to and from a bed to a chair (including a wheelchair)?: None Help needed standing up from a chair using your arms (e.g., wheelchair or bedside chair)?: None Help needed to walk in hospital room?: A Little Help needed climbing 3-5 steps with a railing? : A Little 6 Click Score: 22    End of Session Equipment Utilized During Treatment: Oxygen;Gait belt Activity Tolerance: Patient tolerated treatment well Patient left: in chair;with call bell/phone within reach Nurse Communication: Mobility status PT Visit Diagnosis: Difficulty in walking, not elsewhere classified (R26.2)     Time:  9935-7017 PT Time Calculation (min) (ACUTE ONLY): 26 min  Charges:  $Gait Training: 23-37 mins                     Lorrin Goodell, PT  Office # [phone] Pager [phone]    Lorriane Shire 06/19/2020, 12:04 PM

## 2020-06-19 NOTE — Progress Notes (Signed)
PT Progress Note  SATURATION QUALIFICATIONS: (This note is used to comply with regulatory documentation for home oxygen)  Patient Saturations on Room Air at Rest = 94%  Patient Saturations on Room Air while Ambulating = 86%  Patient Saturations on 2 Liters of oxygen while Ambulating = 92%  Please briefly explain why patient needs home oxygen: 2L supplemental O2 required during ambulation to maintain SpO2 > 90%.  Lorrin Goodell, PT  Office # [phone] Pager [phone]

## 2020-06-19 NOTE — Plan of Care (Signed)
  Problem: Education: Goal: Knowledge of General Education information will improve Description: Including pain rating scale, medication(s)/side effects and non-pharmacologic comfort measures Outcome: Progressing   Problem: Health Behavior/Discharge Planning: Goal: Ability to manage health-related needs will improve Outcome: Progressing   Problem: Clinical Measurements: Goal: Ability to maintain clinical measurements within normal limits will improve Outcome: Progressing Goal: Will remain free from infection Outcome: Progressing Goal: Diagnostic test results will improve Outcome: Progressing Goal: Respiratory complications will improve Outcome: Progressing Goal: Cardiovascular complication will be avoided Outcome: Progressing   Problem: Health Behavior/Discharge Planning: Goal: Ability to manage health-related needs will improve Outcome: Progressing   Problem: Education: Goal: Knowledge of General Education information will improve Description: Including pain rating scale, medication(s)/side effects and non-pharmacologic comfort measures Outcome: Progressing   

## 2020-06-20 ENCOUNTER — Ambulatory Visit: Payer: PPO | Admitting: Cardiology

## 2020-06-20 ENCOUNTER — Other Ambulatory Visit (HOSPITAL_COMMUNITY): Payer: Self-pay | Admitting: Internal Medicine

## 2020-06-20 LAB — CBC WITH DIFFERENTIAL/PLATELET
Abs Immature Granulocytes: 0.08 10*3/uL — ABNORMAL HIGH (ref 0.00–0.07)
Basophils Absolute: 0 10*3/uL (ref 0.0–0.1)
Basophils Relative: 0 %
Eosinophils Absolute: 0 10*3/uL (ref 0.0–0.5)
Eosinophils Relative: 0 %
HCT: 39.8 % (ref 36.0–46.0)
Hemoglobin: 13.9 g/dL (ref 12.0–15.0)
Immature Granulocytes: 1 %
Lymphocytes Relative: 18 %
Lymphs Abs: 1.8 10*3/uL (ref 0.7–4.0)
MCH: 30.8 pg (ref 26.0–34.0)
MCHC: 34.9 g/dL (ref 30.0–36.0)
MCV: 88.2 fL (ref 80.0–100.0)
Monocytes Absolute: 0.9 10*3/uL (ref 0.1–1.0)
Monocytes Relative: 9 %
Neutro Abs: 7.5 10*3/uL (ref 1.7–7.7)
Neutrophils Relative %: 72 %
Platelets: 747 10*3/uL — ABNORMAL HIGH (ref 150–400)
RBC: 4.51 MIL/uL (ref 3.87–5.11)
RDW: 14.4 % (ref 11.5–15.5)
WBC: 10.3 10*3/uL (ref 4.0–10.5)
nRBC: 0 % (ref 0.0–0.2)

## 2020-06-20 LAB — D-DIMER, QUANTITATIVE: D-Dimer, Quant: 0.43 ug/mL-FEU (ref 0.00–0.50)

## 2020-06-20 LAB — COMPREHENSIVE METABOLIC PANEL
ALT: 31 U/L (ref 0–44)
AST: 24 U/L (ref 15–41)
Albumin: 2.4 g/dL — ABNORMAL LOW (ref 3.5–5.0)
Alkaline Phosphatase: 94 U/L (ref 38–126)
Anion gap: 7 (ref 5–15)
BUN: 24 mg/dL — ABNORMAL HIGH (ref 8–23)
CO2: 22 mmol/L (ref 22–32)
Calcium: 9.7 mg/dL (ref 8.9–10.3)
Chloride: 107 mmol/L (ref 98–111)
Creatinine, Ser: 0.72 mg/dL (ref 0.44–1.00)
GFR, Estimated: >60 mL/min (ref >60)
Glucose, Bld: 130 mg/dL — ABNORMAL HIGH (ref 70–99)
Potassium: 4.7 mmol/L (ref 3.5–5.1)
Sodium: 136 mmol/L (ref 135–145)
Total Bilirubin: 1 mg/dL (ref 0.3–1.2)
Total Protein: 5.2 g/dL — ABNORMAL LOW (ref 6.5–8.1)

## 2020-06-20 LAB — C-REACTIVE PROTEIN: CRP: 0.6 mg/dL (ref <1.0)

## 2020-06-20 LAB — FERRITIN: Ferritin: 546 ng/mL — ABNORMAL HIGH (ref 11–307)

## 2020-06-20 LAB — PHOSPHORUS: Phosphorus: 3.1 mg/dL (ref 2.5–4.6)

## 2020-06-20 LAB — MAGNESIUM: Magnesium: 2.2 mg/dL (ref 1.7–2.4)

## 2020-06-20 MED ORDER — PREDNISONE 10 MG PO TABS: 40.0000 mg | ORAL_TABLET | Freq: Every day | ORAL | 0 refills | Status: DC

## 2020-06-20 MED ORDER — LISINOPRIL 20 MG PO TABS: 20.0000 mg | ORAL_TABLET | Freq: Every day | ORAL | 0 refills | Status: DC

## 2020-06-20 MED FILL — predniSONE 10 MG TABS: 10 | 5 days supply | Qty: 20 | Fill #0

## 2020-06-20 NOTE — TOC Transition Note (Signed)
Transition of Care [facility]) - CM/SW Discharge Note   Patient Details  Name: Nancy Merritt MRN: [id number] Date of Birth: 02-21-1941  Transition [facility]) CM/SW Contact:  Carles Collet, RN Phone Number: 06/20/2020, 2:02 PM   Clinical Narrative:    Spoke w patient to set up home oxygen.  Referral made to [employer].  Portable unit and tank to be delivered to unit. Patient has family who will provide ride home. No other CM needs identified.     Final next level of care: Home/Self Care Barriers to Discharge: No Barriers Identified   Patient Goals and CMS Choice        Discharge Placement                       Discharge Plan and Services                DME Arranged: Oxygen DME Agency: [employer] Date DME Agency Contacted: 06/20/20 Time DME Agency Contacted: [phone] Representative spoke with at DME Agency: [place] Interventions     Readmission Risk Interventions No flowsheet data found.

## 2020-06-20 NOTE — Plan of Care (Signed)
Pt being discharged home with family. Pt home medication and 2 tanks of oxygen sent with her. Pt questions and needs addressed. Pt expressed no other needs at this time. Will continue to monitor.

## 2020-06-20 NOTE — Discharge Summary (Signed)
Physician Discharge Summary  Nancy Merritt [id number] DOB: Jan 13, 1941 DOA: 06/15/2020  PCP: Lesleigh Noe, MD  Admit date: 06/15/2020 Discharge date: 06/20/2020  Admitted From: Home Disposition:  Home  Recommendations for Outpatient Follow-up:  1. Follow up with PCP in 1-2 weeks   Equipment/Devices:Home O2    Discharge Condition:Improved CODE STATUS:DNR Diet recommendation: Heart healthy   Brief/Interim Summary: 80 y.o.femalewith medical history significant ofCAD status post CABG x4, COPD, depression, CVA , breast cancer, hypertension who presents withabout a weekof shortness of breath, orthopnea. She states she has had about a month of increased leg swelling, 2 weeks of fatigue and 1 week of shortness of breath as above.She was checking her home pulse ox back on the recommendation of her doctor and found that she had desatted to 85% at home.Shecalled her doctor who recommended that she go to the ED to be evaluated. Patient continued to have saturations in the mid to upper 80s which improved with 2 L of oxygen. Of note,Patient is unvaccinated and her daughter was positive for Covid 2 weeks ago. She lives with her daughter. Patient denies fevers, chills, chest pain, dental pain, constipation, diarrhea, nausea, vomiting.  ED Course:Vital signs in the ED significant for requiring 2 L to maintain saturations, tachypnea in the 20s, heart rate in the 90s to low 100s. Lab work-up showed BMP with glucose 132. CBC with hemoglobin stable 15.6 and platelets stable at 544. BMP normal. Troponin pending. Respiratory panel for flu and Covid positive for Covid. Imaging showed chest x-ray with patchy right lung infiltrates. Patient started on steroids and remdesivir in ED.  Discharge Diagnoses:  Principal Problem:   Acute hypoxemic respiratory failure due to COVID-19 [facility]) Active Problems:   Hypertension   Hx of breast cancer   CAD (coronary artery  disease)   COPD (chronic obstructive pulmonary disease) (HCC)   History of CVA (cerebrovascular accident) without residual deficits   COVID-19  Acute proximal respiratory failure due to COVID-19 > Noted to be covid pos >in the setting of ongoing shortness of breath and daughter with recent positive. Unvaccinated. >Weaned to [facility], cont to wean O2 as tolerated, still desaturates on ambulation, cont to wean o2 >patchy opacities were noted in R lung fields on chest x-ray -Continue on Decadron as tolerated, complete course of prednisone on d/c -completed course ofremdesivir -Zinc, vitamin C -Cont PRN bronchodilator -cont to follow serial inflammatory markers. CRP is down to 0.6  COPD -Continue home to troponin, Breo, albuterol - continued with PRN Combivent PRN  CADstatus post CABG x4 - Cont wtih home aspirin, atorvastatin, lisinopril  History of CVAwith resolution of speech deficits -Continue home statin as pt tolerates  Hypertension -Continue home lisinopril as tolerated -BP remains stable  History of breast cancer -Pt is followed by Dr. Alen Blew as outpatient  Discharge Instructions   Allergies as of 06/20/2020      Reactions   Advair Diskus [fluticasone-salmeterol] Shortness Of Breath   As of 09/01/2014. Pt can take Breo   Levofloxacin Other (See Comments)   Nausea, brain fog, depression, as of 12/12/14   Qvar [beclomethasone] Other (See Comments)   Lungs closing-as of 05/03/2015   Singulair [montelukast] Shortness Of Breath   Symbicort [budesonide-formoterol Fumarate] Other (See Comments)   Hard time breathing as of 10/31/2014.      Medication List    TAKE these medications   albuterol (2.5 MG/3ML) 0.083% nebulizer solution Commonly known as: PROVENTIL INHALE 1 VIAL VIA NEBULIZER 3 TIMES A DAY AS NEEDED What changed:  See the new instructions.   albuterol 108 (90 Base) MCG/ACT inhaler Commonly known as: VENTOLIN HFA Inhale 2 puffs into the  lungs every 4 (four) hours as needed for wheezing or shortness of breath. What changed: Another medication with the same name was changed. Make sure you understand how and when to take each.   atorvastatin 40 MG tablet Commonly known as: LIPITOR Take 1 tablet (40 mg total) by mouth daily.   Breo Ellipta 200-25 MCG/INH Aepb Generic drug: fluticasone furoate-vilanterol INHALE 1 PUFF BY MOUTH EVERY DAY What changed: See the new instructions.   [employer] Aspirin Adult Low Dose 81 MG chewable tablet Generic drug: aspirin Chew 81 mg by mouth daily.   FISH OIL + D3 PO Take 1 capsule by mouth daily.   ibuprofen 200 MG tablet Commonly known as: ADVIL Take 200 mg by mouth every 6 (six) hours as needed for fever or headache.   lisinopril 20 MG tablet Commonly known as: ZESTRIL Take 1 tablet (20 mg total) by mouth daily. Start taking on: June 21, 2020 What changed: when to take this   loratadine 10 MG tablet Commonly known as: CLARITIN Take 10 mg by mouth daily.   multivitamin tablet Take 1 tablet by mouth daily.   predniSONE 10 MG tablet Commonly known as: DELTASONE Take 4 tablets (40 mg total) by mouth daily with breakfast for 5 days. Start taking on: June 21, 2020   Spiriva Respimat 2.5 MCG/ACT Aers Generic drug: Tiotropium Bromide Monohydrate INHALE 2 PUFFS INTO THE LUNGS EVERY DAY What changed: See the new instructions.   sulfamethoxazole-trimethoprim 800-160 MG tablet Commonly known as: BACTRIM DS Take 1 tablet by mouth 2 (two) times daily for 7 days.            Durable Medical Equipment  (From admission, onward)         Start     Ordered   06/20/20 1027  For home use only DME oxygen  Once       Question Answer Comment  Length of Need 6 Months   Mode or (Route) Nasal cannula   Liters per Minute 2   Frequency Continuous (stationary and portable oxygen unit needed)   Oxygen delivery system Gas      06/20/20 1026          Follow-up Information     Lesleigh Noe, MD. Schedule an appointment as soon as possible for a visit in 2 week(s).   Specialty: Family Medicine Contact information: [address] [place] [address] [phone]              Allergies  Allergen Reactions  . Advair Diskus [Fluticasone-Salmeterol] Shortness Of Breath    As of 09/01/2014. Pt can take Breo  . Levofloxacin Other (See Comments)    Nausea, brain fog, depression, as of 12/12/14  . Qvar [Beclomethasone] Other (See Comments)    Lungs closing-as of 05/03/2015  . Singulair [Montelukast] Shortness Of Breath  . Symbicort [Budesonide-Formoterol Fumarate] Other (See Comments)    Hard time breathing as of 10/31/2014.    Procedures/Studies: DG Chest 2 View  Result Date: 06/15/2020 CLINICAL DATA:  Shortness of breath. EXAM: CHEST - 2 VIEW COMPARISON:  No prior. FINDINGS: Prior median sternotomy. Heart size normal. Patchy right lung infiltrates consistent with pneumonia. Mild bibasilar atelectasis. No pleural effusion or pneumothorax. Thoracic spine scoliosis concave left. Prior left mastectomy. IMPRESSION: Patchy right lung infiltrates consistent with pneumonia. Question patient's COVID status. Mild bibasilar atelectasis. Electronically Signed  By: [place]   On: 06/15/2020 13:27     Subjective: Eager to go home  Discharge Exam: Vitals:   06/20/20 0432 06/20/20 0915  BP: 129/66   Pulse: 64   Resp: 19   Temp: 97.6 F (36.4 C)   SpO2: 97% 96%   Vitals:   06/19/20 1300 06/19/20 2040 06/20/20 0432 06/20/20 0915  BP: (!) 101/50 (!) 110/40 129/66   Pulse: 92 73 64   Resp: 17 20 19    Temp: 98.4 F (36.9 C) 97.6 F (36.4 C) 97.6 F (36.4 C)   TempSrc: Oral Oral Oral   SpO2: 95% 96% 97% 96%  Weight:      Height:        General: Pt is alert, awake, not in acute distress Cardiovascular: RRR, S1/S2 +, no rubs, no gallops Respiratory: CTA bilaterally, no wheezing, no rhonchi Abdominal: Soft, NT, ND, bowel sounds + Extremities: no  edema, no cyanosis   The results of significant diagnostics from this hospitalization (including imaging, microbiology, ancillary and laboratory) are listed below for reference.     Microbiology: Recent Results (from the past 240 hour(s))  Urine Culture     Status: Abnormal   Collection Time: 06/14/20  3:09 PM   Specimen: Urine  Result Value Ref Range Status   MICRO NUMBER: [id number]  Final   SPECIMEN QUALITY: Adequate  Final   Sample Source URINE, CLEAN CATCH  Final   STATUS: FINAL  Final   ISOLATE 1: Proteus mirabilis (A)  Final    Comment: Greater than 100,000 CFU/mL of Proteus mirabilis      Susceptibility   Proteus mirabilis - URINE CULTURE, REFLEX    AMOX/CLAVULANIC <=2 Sensitive     AMPICILLIN <=2 Sensitive     AMPICILLIN/SULBACTAM <=2 Sensitive     CEFAZOLIN* <=4 Not Reportable      * For infections other than uncomplicated UTIcaused by E. coli, K. pneumoniae or P. mirabilis:Cefazolin is resistant if MIC > or = 8 mcg/mL.(Distinguishing susceptible versus intermediatefor isolates with MIC < or = 4 mcg/mL requiresadditional testing.)For uncomplicated UTI caused by E. coli,K. pneumoniae or P. mirabilis: Cefazolin issusceptible if MIC <32 mcg/mL and predictssusceptible to the oral agents cefaclor, cefdinir,cefpodoxime, cefprozil, cefuroxime, cephalexinand loracarbef.    CEFEPIME <=1 Sensitive     CEFTRIAXONE <=1 Sensitive     CIPROFLOXACIN <=0.25 Sensitive     LEVOFLOXACIN <=0.12 Sensitive     ERTAPENEM <=0.5 Sensitive     GENTAMICIN <=1 Sensitive     NITROFURANTOIN 128 Resistant     PIP/TAZO <=4 Sensitive     TOBRAMYCIN <=1 Sensitive     TRIMETH/SULFA* <=20 Sensitive      * For infections other than uncomplicated UTIcaused by E. coli, K. pneumoniae or P. mirabilis:Cefazolin is resistant if MIC > or = 8 mcg/mL.(Distinguishing susceptible versus intermediatefor isolates with MIC < or = 4 mcg/mL requiresadditional testing.)For uncomplicated UTI caused by E. coli,K. pneumoniae or  P. mirabilis: Cefazolin issusceptible if MIC <32 mcg/mL and predictssusceptible to the oral agents cefaclor, cefdinir,cefpodoxime, cefprozil, cefuroxime, cephalexinand loracarbef.Legend:S = Susceptible  I = IntermediateR = Resistant  NS = Not susceptible* = Not tested  NR = Not reported**NN = See antimicrobic comments  SARS Coronavirus 2 by RT PCR (hospital order, performed in [facility] hospital lab) Nasopharyngeal Nasopharyngeal Swab     Status: Abnormal   Collection Time: 06/15/20  4:54 PM   Specimen: Nasopharyngeal Swab  Result Value Ref Range Status   SARS Coronavirus 2 POSITIVE (A) NEGATIVE  Final    Comment: RESULT CALLED TO, READ BACK BY AND VERIFIED WITH: H DOOLEY RN [id number] 06/15/20 A BROWNING (NOTE) SARS-CoV-2 target nucleic acids are DETECTED  SARS-CoV-2 RNA is generally detectable in upper respiratory specimens  during the acute phase of infection.  Positive results are indicative  of the presence of the identified virus, but do not rule out bacterial infection or co-infection with other pathogens not detected by the test.  Clinical correlation with patient history and  other diagnostic information is necessary to determine patient infection status.  The expected result is negative.  Fact Sheet for Patients:   StrictlyIdeas.no   Fact Sheet for Healthcare Providers:   BankingDealers.co.za    This test is not yet approved or cleared by the [place] FDA and  has been authorized for detection and/or diagnosis of SARS-CoV-2 by FDA under an Emergency Use Authorization (EUA).  This EUA will remain in effect (meaning this t est can be used) for the duration of  the COVID-19 declaration under Section 564(b)(1) of the Act, 21 U.S.C. section 360-bbb-3(b)(1), unless the authorization is terminated or revoked sooner.  Performed at [facility], [place] [address] [place], [place] [address]      Labs: BNP (last 3 results) Recent  Labs    06/15/20 1309  BNP 14.9   Basic Metabolic Panel: Recent Labs  Lab 06/16/20 0704 06/17/20 0519 06/18/20 0405 06/19/20 0324 06/20/20 0226  NA 135 138 137 136 136  K 3.9 4.3 4.3 4.6 4.7  CL 101 104 108 105 107  CO2 22 21* 22 23 22   GLUCOSE 171* 126* 122* 150* 130*  BUN 14 25* 24* 26* 24*  CREATININE 0.92 0.98 0.93 0.94 0.72  CALCIUM 10.0 10.6* 9.9 10.4* 9.7  MG 1.8 2.1 1.9 2.2 2.2  PHOS 3.1 3.4 3.2 2.9 3.1   Liver Function Tests: Recent Labs  Lab 06/16/20 0704 06/17/20 0519 06/18/20 0405 06/19/20 0324 06/20/20 0226  AST 32 42* 31 31 24   ALT 30 38 34 37 31  ALKPHOS 102 103 84 112 94  BILITOT 0.8 0.5 0.6 0.4 1.0  PROT 6.3* 6.2* 5.4* 6.0* 5.2*  ALBUMIN 2.7* 2.9* 2.5* 2.8* 2.4*   No results for input(s): LIPASE, AMYLASE in the last 168 hours. No results for input(s): AMMONIA in the last 168 hours. CBC: Recent Labs  Lab 06/16/20 0704 06/17/20 0519 06/18/20 0405 06/19/20 0324 06/20/20 0226  WBC 5.3 15.7* 13.4* 12.7* 10.3  NEUTROABS 4.0 12.8* 10.6* 9.5* 7.5  HGB 14.4 15.3* 13.4 15.1* 13.9  HCT 43.4 45.9 39.8 45.7 39.8  MCV 89.5 89.5 88.6 90.1 88.2  PLT 510* 740* 690* 838* 747*   Cardiac Enzymes: No results for input(s): CKTOTAL, CKMB, CKMBINDEX, TROPONINI in the last 168 hours. BNP: Invalid input(s): POCBNP CBG: No results for input(s): GLUCAP in the last 168 hours. D-Dimer Recent Labs    06/19/20 0324 06/20/20 0226  DDIMER 0.73* 0.43   Hgb A1c No results for input(s): HGBA1C in the last 72 hours. Lipid Profile No results for input(s): CHOL, HDL, LDLCALC, TRIG, CHOLHDL, LDLDIRECT in the last 72 hours. Thyroid function studies No results for input(s): TSH, T4TOTAL, T3FREE, THYROIDAB in the last 72 hours.  Invalid input(s): FREET3 Anemia work up Recent Labs    06/19/20 0324 06/20/20 0226  FERRITIN 550* 546*   Urinalysis    Component Value Date/Time   BILIRUBINUR Negative 06/14/2020 1457   KETONESUR trace (5) (A) 05/18/2020 1230    PROTEINUR Positive (A) 06/14/2020  [place] 1.0 06/14/2020 1457   NITRITE Negative 06/14/2020 1457   LEUKOCYTESUR Large (3+) (A) 06/14/2020 1457   Sepsis Labs Invalid input(s): PROCALCITONIN,  WBC,  LACTICIDVEN Microbiology Recent Results (from the past 240 hour(s))  Urine Culture     Status: Abnormal   Collection Time: 06/14/20  3:09 PM   Specimen: Urine  Result Value Ref Range Status   MICRO NUMBER: [id number]  Final   SPECIMEN QUALITY: Adequate  Final   Sample Source URINE, CLEAN CATCH  Final   STATUS: FINAL  Final   ISOLATE 1: Proteus mirabilis (A)  Final    Comment: Greater than 100,000 CFU/mL of Proteus mirabilis      Susceptibility   Proteus mirabilis - URINE CULTURE, REFLEX    AMOX/CLAVULANIC <=2 Sensitive     AMPICILLIN <=2 Sensitive     AMPICILLIN/SULBACTAM <=2 Sensitive     CEFAZOLIN* <=4 Not Reportable      * For infections other than uncomplicated UTIcaused by E. coli, K. pneumoniae or P. mirabilis:Cefazolin is resistant if MIC > or = 8 mcg/mL.(Distinguishing susceptible versus intermediatefor isolates with MIC < or = 4 mcg/mL requiresadditional testing.)For uncomplicated UTI caused by E. coli,K. pneumoniae or P. mirabilis: Cefazolin issusceptible if MIC <32 mcg/mL and predictssusceptible to the oral agents cefaclor, cefdinir,cefpodoxime, cefprozil, cefuroxime, cephalexinand loracarbef.    CEFEPIME <=1 Sensitive     CEFTRIAXONE <=1 Sensitive     CIPROFLOXACIN <=0.25 Sensitive     LEVOFLOXACIN <=0.12 Sensitive     ERTAPENEM <=0.5 Sensitive     GENTAMICIN <=1 Sensitive     NITROFURANTOIN 128 Resistant     PIP/TAZO <=4 Sensitive     TOBRAMYCIN <=1 Sensitive     TRIMETH/SULFA* <=20 Sensitive      * For infections other than uncomplicated UTIcaused by E. coli, K. pneumoniae or P. mirabilis:Cefazolin is resistant if MIC > or = 8 mcg/mL.(Distinguishing susceptible versus intermediatefor isolates with MIC < or = 4 mcg/mL requiresadditional testing.)For uncomplicated  UTI caused by E. coli,K. pneumoniae or P. mirabilis: Cefazolin issusceptible if MIC <32 mcg/mL and predictssusceptible to the oral agents cefaclor, cefdinir,cefpodoxime, cefprozil, cefuroxime, cephalexinand loracarbef.Legend:S = Susceptible  I = IntermediateR = Resistant  NS = Not susceptible* = Not tested  NR = Not reported**NN = See antimicrobic comments  SARS Coronavirus 2 by RT PCR (hospital order, performed in [place] hospital lab) Nasopharyngeal Nasopharyngeal Swab     Status: Abnormal   Collection Time: 06/15/20  4:54 PM   Specimen: Nasopharyngeal Swab  Result Value Ref Range Status   SARS Coronavirus 2 POSITIVE (A) NEGATIVE Final    Comment: RESULT CALLED TO, READ BACK BY AND VERIFIED WITH: Shirline Frees RN 1817 06/15/20 A BROWNING (NOTE) SARS-CoV-2 target nucleic acids are DETECTED  SARS-CoV-2 RNA is generally detectable in upper respiratory specimens  during the acute phase of infection.  Positive results are indicative  of the presence of the identified virus, but do not rule out bacterial infection or co-infection with other pathogens not detected by the test.  Clinical correlation with patient history and  other diagnostic information is necessary to determine patient infection status.  The expected result is negative.  Fact Sheet for Patients:   StrictlyIdeas.no   Fact Sheet for Healthcare Providers:   BankingDealers.co.za    This test is not yet approved or cleared by the [place] FDA and  has been authorized for detection and/or diagnosis of SARS-CoV-2 by FDA under an Emergency Use Authorization (EUA).  This EUA will  remain in effect (meaning this t est can be used) for the duration of  the COVID-19 declaration under Section 564(b)(1) of the Act, 21 U.S.C. section 360-bbb-3(b)(1), unless the authorization is terminated or revoked sooner.  Performed at [facility], [place] [address] [place], [place] [address]     Time spent: 30 min  SIGNED:   Marylu Lund, MD  [facility] 06/20/2020, 12:47 PM  If 7PM-7AM, please contact night-coverage

## 2020-06-20 NOTE — Progress Notes (Signed)
DNR bracelet re-applied to patient's left wrist, witnessed by [facility].

## 2020-06-20 NOTE — Care Management Important Message (Signed)
Important Message  Patient Details  Name: Nancy Merritt MRN: [id number] Date of Birth: 1941-03-27   Medicare Important Message Given:  Yes - Important Message mailed due to current National Emergency  Verbal consent obtained due to current National Emergency  Relationship to patient: Self Contact Name: Delvina Mizzell Call Date: 06/20/20  Time: 1502 Phone: [phone] Outcome: No Answer/Busy Important Message mailed to: Patient address on file    Delorse Lek 06/20/2020, 3:02 PM

## 2020-06-21 ENCOUNTER — Telehealth: Payer: Self-pay

## 2020-06-21 NOTE — Telephone Encounter (Signed)
Transition Care Management Unsuccessful Follow-up Telephone Call  Date of discharge and from where:  06/20/2020, [facility]  Attempts:  1st Attempt  Reason for unsuccessful TCM follow-up call:  Left voice message

## 2020-06-21 NOTE — Telephone Encounter (Signed)
Transition Care Management Unsuccessful Follow-up Telephone Call  Date of discharge and from where:  06/20/2020, Nancy Merritt   Attempts:  3rd Attempt  Reason for unsuccessful TCM follow-up call:  Left voice message

## 2020-06-21 NOTE — Telephone Encounter (Signed)
Transition Care Management Unsuccessful Follow-up Telephone Call  Date of discharge and from where:  06/20/2020, Nancy Merritt   Attempts:  2nd Attempt  Reason for unsuccessful TCM follow-up call:  Left voice message

## 2020-06-22 NOTE — Telephone Encounter (Signed)
Noted. She has a f/u on 2/10

## 2020-06-29 ENCOUNTER — Inpatient Hospital Stay: Payer: PPO | Admitting: Family Medicine

## 2020-07-10 ENCOUNTER — Other Ambulatory Visit: Payer: Self-pay

## 2020-07-11 ENCOUNTER — Ambulatory Visit (INDEPENDENT_AMBULATORY_CARE_PROVIDER_SITE_OTHER): Payer: PPO | Admitting: Family Medicine

## 2020-07-11 ENCOUNTER — Encounter: Payer: Self-pay | Admitting: Family Medicine

## 2020-07-11 VITALS — BP 130/70 | HR 93 | Temp 97.6°F | Ht 63.0 in | Wt 167.8 lb

## 2020-07-11 DIAGNOSIS — I10 Essential (primary) hypertension: Secondary | ICD-10-CM

## 2020-07-11 DIAGNOSIS — J411 Mucopurulent chronic bronchitis: Secondary | ICD-10-CM

## 2020-07-11 DIAGNOSIS — U071 COVID-19: Secondary | ICD-10-CM | POA: Diagnosis not present

## 2020-07-11 DIAGNOSIS — H6123 Impacted cerumen, bilateral: Secondary | ICD-10-CM | POA: Insufficient documentation

## 2020-07-11 DIAGNOSIS — J9601 Acute respiratory failure with hypoxia: Secondary | ICD-10-CM | POA: Diagnosis not present

## 2020-07-11 DIAGNOSIS — Z8616 Personal history of COVID-19: Secondary | ICD-10-CM | POA: Diagnosis not present

## 2020-07-11 DIAGNOSIS — J4530 Mild persistent asthma, uncomplicated: Secondary | ICD-10-CM

## 2020-07-11 MED ORDER — SPIRIVA RESPIMAT 2.5 MCG/ACT IN AERS: INHALATION_SPRAY | RESPIRATORY_TRACT | 2 refills | Status: DC

## 2020-07-11 MED ORDER — BREO ELLIPTA 200-25 MCG/INH IN AEPB: INHALATION_SPRAY | RESPIRATORY_TRACT | 5 refills | Status: DC

## 2020-07-11 NOTE — Assessment & Plan Note (Signed)
Cough, SOB, and wheezes today in setting of running out of controller medications. Discussed steroids, but will avoid for now. Encouraged scheduled albuterol for the next 2-3 days. Restart spiriva and Breo. F/u if not improving.

## 2020-07-11 NOTE — Assessment & Plan Note (Signed)
BP at goal, cont lisinopril 20 mg.

## 2020-07-11 NOTE — Assessment & Plan Note (Signed)
Doing well on 2L [place] with good pulse ox. Notes lower saturation at home. Discussed continuing 2L as long as >90% and decreasing consistently high and walking w/o SOB or desaturation. HH PT ordered to help with weakness.

## 2020-07-11 NOTE — Patient Instructions (Addendum)
#   Asthma - 1-3 days  - Use rescue inhaler every 4-6 hours while awake - If feeling better, decrease to every 8 hours, then every 12 hours, then just as needed - restart daily inhalers   Pick up and start wearing compression socks for the legs  Home Health Physical Therapy   Try ear wax drops at home

## 2020-07-11 NOTE — Progress Notes (Signed)
Subjective:     Nancy Merritt is a 80 y.o. female presenting for hospital followup (Covid )     HPI  #Post Covid - fatigue has significantly improved - feels weak from resting - cough is persisting - ran out of copd medications  - cough just has not gone away - daughter is concerned about stuffy ears -- has noticed some hearing difficulty  Is on 2L of oxygen -- has been 90-92% until today   Is getting energy back   Still getting SOB - this is unimproved   Review of Systems  06/15/2020-06/20/2020: Admission - Covid-19 infection - steroids, remdesivir, zinc and vit c. CRP improved. COPD - home treatment.   Social History   Tobacco Use  Smoking Status Former Smoker  . Packs/day: 1.00  . Years: 40.00  . Pack years: 40.00  . Types: Cigarettes  . Quit date: 2002  . Years since quitting: 20.1  Smokeless Tobacco Never Used        Objective:    BP Readings from Last 3 Encounters:  07/11/20 130/70  06/20/20 129/66  05/18/20 138/80   Wt Readings from Last 3 Encounters:  07/11/20 167 lb 12 oz (76.1 kg)  06/17/20 166 lb 10.7 oz (75.6 kg)  05/18/20 168 lb 6.4 oz (76.4 kg)    BP 130/70   Pulse 93   Temp 97.6 F (36.4 C) (Temporal)   Ht 5\' 3"  (1.6 m)   Wt 167 lb 12 oz (76.1 kg)   SpO2 100%   BMI 29.72 kg/m    Physical Exam Constitutional:      General: She is not in acute distress.    Appearance: She is well-developed. She is not diaphoretic.  HENT:     Right Ear: External ear normal. There is impacted cerumen.     Left Ear: External ear normal. There is impacted cerumen.     Nose: Nose normal.  Eyes:     Conjunctiva/sclera: Conjunctivae normal.  Cardiovascular:     Rate and Rhythm: Normal rate and regular rhythm.     Heart sounds: Heart sounds are distant.  Pulmonary:     Effort: Pulmonary effort is normal. No accessory muscle usage.     Breath sounds: Decreased air movement present. Wheezing and rhonchi present. No rales.     Comments: 2L  [place] Musculoskeletal:     Cervical back: Neck supple.  Skin:    General: Skin is warm and dry.     Capillary Refill: Capillary refill takes less than 2 seconds.  Neurological:     Mental Status: She is alert. Mental status is at baseline.  Psychiatric:        Mood and Affect: Mood normal.        Behavior: Behavior normal.           Assessment & Plan:   Problem List Items Addressed This Visit      Cardiovascular and Mediastinum   Hypertension    BP at goal, cont lisinopril 20 mg.         Respiratory   Mild persistent asthma    Cough, SOB, and wheezes today in setting of running out of controller medications. Discussed steroids, but will avoid for now. Encouraged scheduled albuterol for the next 2-3 days. Restart spiriva and Breo. F/u if not improving.       Relevant Medications   fluticasone furoate-vilanterol (BREO ELLIPTA) 200-25 MCG/INH AEPB   Tiotropium Bromide Monohydrate (SPIRIVA RESPIMAT) 2.5 MCG/ACT AERS   Other Relevant  Orders   Ambulatory referral to Home Health   COPD (chronic obstructive pulmonary disease) ([place]) - Primary   Relevant Medications   fluticasone furoate-vilanterol (BREO ELLIPTA) 200-25 MCG/INH AEPB   Tiotropium Bromide Monohydrate (SPIRIVA RESPIMAT) 2.5 MCG/ACT AERS   Other Relevant Orders   Ambulatory referral to [place]   Acute hypoxemic respiratory failure due to COVID-19 [facility])    Doing well on 2L [place] with good pulse ox. Notes lower saturation at home. Discussed continuing 2L as long as >90% and decreasing consistently high and walking w/o SOB or desaturation. HH PT ordered to help with weakness.         Nervous and Auditory   Impacted cerumen of both ears    Responded to removal with manual as well as MA support for lavage. Improved hearing.         Other   History of COVID-19   Relevant Orders   Ambulatory referral to [place]       Return in about 4 weeks (around 08/08/2020).  Lesleigh Noe, MD  This visit occurred  during the SARS-CoV-2 public health emergency.  Safety protocols were in place, including screening questions prior to the visit, additional usage of staff PPE, and extensive cleaning of exam room while observing appropriate contact time as indicated for disinfecting solutions.

## 2020-07-11 NOTE — Assessment & Plan Note (Signed)
Responded to removal with manual as well as MA support for lavage. Improved hearing.

## 2020-07-15 NOTE — Progress Notes (Signed)
Cardiology Office Note:    Date:  07/17/2020   ID:  Nancy Merritt, DOB [birth year], MRN [id number]  PCP:  Lesleigh Noe, MD   [place]  Cardiologist:  No primary care provider on file.  Advanced Practice Provider:  No care team member to display Electrophysiologist:  None   Referring MD: Lesleigh Noe, MD    History of Present Illness:    Nancy Merritt is a 80 y.o. 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 Einar Pheasant for further evaluation of prior CVA.  Patient states she had a stroke when she was visiting her [name] in [place] in 04/2020. Specifically, she developed speech difficulties, seemed "out-of-it", and was told she was drooling. MRI confirmed acute stroke. CTA head and neck without acute findings (per report). TTE was performed but unknown results. No known arrhythmias. Symptoms fortunately resolved. She was started on ASA 81mg  and atorvastatin 40mg  daily. Blood pressure has been well controlled at home on lisinopril.   Following her stroke, the patient developed COVID in 05/2020. Noted that her O2 sats were low at 84% with associated fatigue prompting her to go the ER. Now requiring supplemental oxygen due to underlying asthma. She remains on oxygen now.  Patient also has history of MI found to have multivessel CAD s/p CABG (4V) in [place] years ago. Has occasional chest pain that is in the center of her chest when she lays down at night or with certain foods. No exertional chest pain but has shortness of breath. Has limited mobility due to the heaviness of the supplemental oxygen tank. No orthopnea, LE edema, lightheadedness, syncope. Patient states she needs a prescription for portable oxygen.  Past Medical History:  Diagnosis Date  . Arthritis   . Asthma   . Cancer ([place]) 1990  . COVID-19 06/14/2020  . Depression      Past Surgical History:  Procedure Laterality Date  . BREAST SURGERY Left 1990  . CORONARY ARTERY BYPASS GRAFT    . JOINT REPLACEMENT Right    Hip  . OVARIAN CYST REMOVAL  1970  . TUBAL LIGATION  1970    Current Medications: Current Meds  Medication Sig  . albuterol (PROVENTIL) (2.5 MG/3ML) 0.083% nebulizer solution INHALE 1 VIAL VIA NEBULIZER 3 TIMES A DAY AS NEEDED  . albuterol (VENTOLIN HFA) 108 (90 Base) MCG/ACT inhaler Inhale 2 puffs into the lungs every 4 (four) hours as needed for wheezing or shortness of breath.  Marland Kitchen atorvastatin (LIPITOR) 40 MG tablet Take 1 tablet (40 mg total) by mouth daily.  . [facility] ASPIRIN ADULT LOW DOSE 81 MG chewable tablet Chew 81 mg by mouth daily.  . Fish Oil-Cholecalciferol (FISH OIL + D3 PO) Take 1 capsule by mouth daily.  . fluticasone furoate-vilanterol (BREO ELLIPTA) 200-25 MCG/INH AEPB INHALE 1 PUFF BY MOUTH EVERY DAY  . ibuprofen (ADVIL,MOTRIN) 200 MG tablet Take 200 mg by mouth every 6 (six) hours as needed for fever or headache.  . lisinopril (ZESTRIL) 20 MG tablet Take 1 tablet (20 mg total) by mouth daily.  Marland Kitchen loratadine (CLARITIN) 10 MG tablet Take 10 mg by mouth daily.  . Multiple Vitamin (MULTIVITAMIN) tablet Take 1 tablet by mouth daily.  . Tiotropium Bromide Monohydrate (SPIRIVA RESPIMAT) 2.5 MCG/ACT AERS INHALE 2 PUFFS INTO THE LUNGS EVERY DAY     Allergies:   Advair diskus [fluticasone-salmeterol],  Levofloxacin, Qvar [beclomethasone], Singulair [montelukast], and Symbicort [budesonide-formoterol fumarate]   Social History   Socioeconomic History  . Marital status: Widowed    Spouse name: Not on file  . Number of children: 3  . Years of education: 2 associate degrees  . Highest education level: Not on file  Occupational History  . Not on file  Tobacco Use  . Smoking status: Former Smoker    Packs/day: 1.00    Years: 40.00    Pack years: 40.00    Types: Cigarettes    Quit date: 2002    Years since quitting: 20.1  .  Smokeless tobacco: Never Used  Vaping Use  . Vaping Use: Never used  Substance and Sexual Activity  . Alcohol use: Not Currently  . Drug use: Never  . Sexual activity: Not Currently  Other Topics Concern  . 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 Difficulty of Paying Living Expenses: Not hard at all  Food Insecurity: No Food Insecurity  . Worried About Charity fundraiser in the Last Year: Never true  . Ran Out of Food in the Last Year: Never true  Transportation Needs: No Transportation Needs  . Lack of Transportation (Medical): No  . Lack of Transportation (Non-Medical): No  Physical Activity: Insufficiently Active  . Days of Exercise per Week: 3 days  . Minutes of Exercise per Session: 20 min  Stress: No Stress Concern Present  . Feeling of Stress : Not at all  Social Connections: Not on file     Family History: The patient's family history includes Alcohol abuse in her brother, daughter, father, and maternal grandfather; Arthritis in her brother, daughter, daughter, daughter, father, maternal grandfather, maternal grandmother, mother, sister, and sister; Asthma in her daughter; Breast cancer (age of onset: 32) in her sister; COPD in her daughter, daughter, father, mother, and sister; Cancer in her father, maternal grandfather, and sister; Depression in her daughter, daughter, daughter, and maternal grandfather; Diabetes in her father and mother; Drug abuse in her sister; Hearing loss in her daughter, maternal grandfather, and mother; Heart attack (age of onset:  36) in her father; Hypertension in her brother, daughter, daughter, daughter, father, maternal grandfather, maternal grandmother, mother, and sister; Miscarriages / Stillbirths in her daughter, mother, and sister; Stroke in her maternal grandmother and mother; Thyroid disease in her daughter, daughter, daughter, maternal grandmother, sister, and sister.  ROS:   Please see the history of present illness.    Review of Systems  Constitutional: Positive for malaise/fatigue. Negative for chills and fever.  HENT: Negative for nosebleeds.   Eyes: Negative for blurred vision and redness.  Respiratory: Positive for shortness of breath.   Cardiovascular: Positive for chest pain. Negative for palpitations, orthopnea, claudication, leg swelling and PND.  Gastrointestinal: Negative for melena, nausea and vomiting.  Genitourinary: Negative for dysuria.  Musculoskeletal: Positive for myalgias.  Neurological: Negative for dizziness and loss of consciousness.  Endo/Heme/Allergies: Negative for polydipsia.  Psychiatric/Behavioral: Negative for substance abuse.    EKGs/Labs/Other Studies Reviewed:  The following studies were reviewed today: No cardiac studies in our system; will obtain records  EKG:  EKG 06/16/20: Sinus tachycardia  Recent Labs: 06/15/2020: B Natriuretic Peptide 47.5 06/20/2020: ALT 31; BUN 24; Creatinine, Ser 0.72; Hemoglobin 13.9; Magnesium 2.2; Platelets 747; Potassium 4.7; Sodium 136  Recent Lipid Panel    Component Value Date/Time   CHOL 148 11/11/2019 1508   TRIG 103.0 11/11/2019 1508   HDL 38.60 (L) 11/11/2019 1508   CHOLHDL 4 11/11/2019 1508   VLDL 20.6 11/11/2019 1508   LDLCALC 89 11/11/2019 1508      Physical Exam:    VS:  BP 130/80   Pulse 99   Ht 5\' 3"  (1.6 m)   Wt 165 lb 3.2 oz (74.9 kg)   SpO2 95%   BMI 29.26 kg/m     Wt Readings from Last 3 Encounters:  07/17/20 165 lb 3.2 oz (74.9 kg)  07/11/20 167 lb 12 oz (76.1 kg)  06/17/20 166 lb 10.7 oz (75.6  kg)     GEN:  Elderly female, comfortable, supplemental oxygen in place HEENT: Normal NECK: No JVD; No carotid bruits CARDIAC: RRR, no murmurs, rubs, gallops RESPIRATORY:  Clear to auscultation with scattered wheezing ABDOMEN: Soft, non-tender, non-distended MUSCULOSKELETAL:  No edema; No deformity  SKIN: Warm and dry NEUROLOGIC:  Alert and oriented x 3 PSYCHIATRIC:  Normal affect   ASSESSMENT:    1. Cerebrovascular accident (CVA) due to embolism of precerebral artery ([place])   2. Coronary artery disease involving native coronary artery of native heart without angina pectoris   3. Primary hypertension   4. Mixed hyperlipidemia   5. Asthma, unspecified asthma severity, unspecified whether complicated, unspecified whether persistent   6. Status post four vessel coronary artery bypass    PLAN:    In order of problems listed above:  #Recent CVA: Patient with recent CVA in 04/2020 while visiting her grandson in [place]. Presented with speech changes, facial droop and mental status changes. MRI reportedly showed acute stroke. Work-up in [facility] included CTA head and neck, MRI, TTE and telemetry monitoring. Records not brought today but reportedly normal other than MRI. -Obtain OSH records -ASA 81mg  daily -Crestor 20mg  daily -Check TTE with bubble -30 day monitor--may need ILR  #CAD s/p 4v CABG: Patient with history of MI in [place] 13 years ago. Found to have multivessel CAD. Has had intermittent chest pain that is related to food and does not sound like angina, however, patient is exertionally limited due to SOB/oxygen requirement. No HF symptoms. -Continue ASA and statin as above -Check TTE -Continue lisinopril 20mg  daily  #HTN: Well controlled at home. -Continue lisinopril 20mg  daily  #HLD: -Continue crestor 20mg  daily -Check lipids  #COPD: #Asthma: #Recent COVID: Patient requiring supplemental O2 since recent COVID infection. Hoping to obtain a portable oxygen  tank. -Continue home inhalers -Continue supplemental O2 -Follow-up with PCP as scheduled     Medication Adjustments/Labs and Tests Ordered: Current medicines are reviewed at length with the patient today.  Concerns regarding medicines are outlined above.  Orders Placed This Encounter  Procedures  . Lipid panel  . Cardiac event monitor  . ECHOCARDIOGRAM COMPLETE BUBBLE STUDY   No orders of the defined types were placed in this encounter.   Patient Instructions  Medication Instructions:  Your physician recommends that you continue on your current medications as directed. Please refer to the Current Medication list given to you today.  *If you need a refill on your cardiac medications before your next  appointment, please call your pharmacy*   Lab Work: TODAY:  LIPIDS  If you have labs (blood work) drawn today and your tests are completely normal, you will receive your results only by: Marland Kitchen MyChart Message (if you have MyChart) OR . A paper copy in the mail If you have any lab test that is abnormal or we need to change your treatment, we will call you to review the results.   Testing/Procedures: Your physician has requested that you have an echocardiogram. Echocardiography is a painless test that uses sound waves to create images of your heart. It provides your doctor with information about the size and shape of your heart and how well your heart's chambers and valves are working. This procedure takes approximately one hour. There are no restrictions for this procedure.   [employer] Cardiac Event Monitor Instructions Your physician has requested you wear your cardiac event monitor for 30 days, (1-30). [employer] may call or text to confirm a shipping address. The monitor will be sent to a land address via UPS. [employer] will not ship a monitor to a PO BOX. It typically takes 3-5 days to receive your monitor after it has been enrolled. [employer] will assist with USPS tracking if  your package is delayed. The telephone number for [employer] is [phone]. Once you have received your monitor, please review the enclosed instructions. Instruction tutorials can also be viewed under help and settings on the enclosed cell phone. Your monitor has already been registered assigning a specific monitor serial # to you.  Applying the monitor Remove cell phone from case and turn it on. The cell phone works as Dealer and needs to be within [employer] of you at all times. The cell phone will need to be charged on a daily basis. We recommend you plug the cell phone into the enclosed charger at your bedside table every night.  Monitor batteries: You will receive two monitor batteries labelled #1 and #2. These are your recorders. Plug battery #2 onto the second connection on the enclosed charger. Keep one battery on the charger at all times. This will keep the monitor battery deactivated. It will also keep it fully charged for when you need to switch your monitor batteries. A small light will be blinking on the battery emblem when it is charging. The light on the battery emblem will remain on when the battery is fully charged.  Open package of a Monitor strip. Insert battery #1 into black hood on strip and gently squeeze monitor battery onto connection as indicated in instruction booklet. Set aside while preparing skin.  Choose location for your strip, vertical or horizontal, as indicated in the instruction booklet. Shave to remove all hair from location. There cannot be any lotions, oils, powders, or colognes on skin where monitor is to be applied. Wipe skin clean with enclosed Saline wipe. Dry skin completely.  Peel paper labeled #1 off the back of the Monitor strip exposing the adhesive. Place the monitor on the chest in the vertical or horizontal position shown in the instruction booklet. One arrow on the monitor strip must be pointing upward. Carefully remove paper  labeled #2, attaching remainder of strip to your skin. Try not to create any folds or wrinkles in the strip as you apply it.  Firmly press and release the circle in the center of the monitor battery. You will hear a small beep. This is turning the monitor battery on. The heart emblem on the monitor battery will light up  every 5 seconds if the monitor battery in turned on and connected to the patient securely. Do not push and hold the circle down as this turns the monitor battery off. The cell phone will locate the monitor battery. A screen will appear on the cell phone checking the connection of your monitor strip. This may read poor connection initially but change to good connection within the next minute. Once your monitor accepts the connection you will hear a series of 3 beeps followed by a climbing crescendo of beeps. A screen will appear on the cell phone showing the two monitor strip placement options. Touch the picture that demonstrates where you applied the monitor strip.  Your monitor strip and battery are waterproof. You are able to shower, bathe, or swim with the monitor on. They just ask you do not submerge deeper than 3 feet underwater. We recommend removing the monitor if you are swimming in a lake, river, or ocean.  Your monitor battery will need to be switched to a fully charged monitor battery approximately once a week. The cell phone will alert you of an action which needs to be made.  On the cell phone, tap for details to reveal connection status, monitor battery status, and cell phone battery status. The green dots indicates your monitor is in good status. A red dot indicates there is something that needs your attention.  To record a symptom, click the circle on the monitor battery. In 30-60 seconds a list of symptoms will appear on the cell phone. Select your symptom and tap save. Your monitor will record a sustained or significant arrhythmia regardless of you clicking  the button. Some patients do not feel the heart rhythm irregularities. [employer] will notify us of any serious or critical events.  Refer to instruction booklet for instructions on switching batteries, changing strips, the Do not disturb or Pause features, or any additional questions.  Call [employer] at [phone], to confirm your monitor is transmitting and record your baseline. They will answer any questions you may have regarding the monitor instructions at that time.  Returning the monitor to [place] all equipment back into blue box. Peel off strip of paper to expose adhesive and close box securely. There is a prepaid UPS shipping label on this box. Drop in a UPS drop box, or at a UPS facility like [employer]. You may also contact [employer] to arrange UPS to pick up monitor package at your home.   Follow-Up: At [facility], you and your health needs are our priority.  As part of our continuing mission to provide you with exceptional heart care, we have created designated Provider Care Teams.  These Care Teams include your primary Cardiologist (physician) and Advanced Practice Providers (APPs -  Physician Assistants and Nurse Practitioners) who all work together to provide you with the care you need, when you need it.  We recommend signing up for the patient portal called "MyChart".  Sign up information is provided on this After Visit Summary.  MyChart is used to connect with patients for Virtual Visits (Telemedicine).  Patients are able to view lab/test results, encounter notes, upcoming appointments, etc.  Non-urgent messages can be sent to your provider as well.   To learn more about what you can do with MyChart, go to [email or web address].    Your next appointment:   6 WEEKS   The format for your next appointment:   In Person  Provider:   Gwyndolyn Kaufman, MD   Other Instructions  Signed, Freada Bergeron, MD  07/17/2020 12:50 PM    [place]

## 2020-07-17 ENCOUNTER — Ambulatory Visit: Payer: PPO | Admitting: Cardiology

## 2020-07-17 ENCOUNTER — Encounter: Payer: Self-pay | Admitting: Cardiology

## 2020-07-17 ENCOUNTER — Encounter: Payer: Self-pay | Admitting: *Deleted

## 2020-07-17 ENCOUNTER — Other Ambulatory Visit: Payer: Self-pay

## 2020-07-17 VITALS — BP 130/80 | HR 99 | Ht 63.0 in | Wt 165.2 lb

## 2020-07-17 DIAGNOSIS — E782 Mixed hyperlipidemia: Secondary | ICD-10-CM

## 2020-07-17 DIAGNOSIS — J45909 Unspecified asthma, uncomplicated: Secondary | ICD-10-CM | POA: Diagnosis not present

## 2020-07-17 DIAGNOSIS — Z951 Presence of aortocoronary bypass graft: Secondary | ICD-10-CM

## 2020-07-17 DIAGNOSIS — I6310 Cerebral infarction due to embolism of unspecified precerebral artery: Secondary | ICD-10-CM | POA: Diagnosis not present

## 2020-07-17 DIAGNOSIS — Z8673 Personal history of transient ischemic attack (TIA), and cerebral infarction without residual deficits: Secondary | ICD-10-CM | POA: Diagnosis not present

## 2020-07-17 DIAGNOSIS — I2510 Atherosclerotic heart disease of native coronary artery without angina pectoris: Secondary | ICD-10-CM

## 2020-07-17 DIAGNOSIS — I10 Essential (primary) hypertension: Secondary | ICD-10-CM | POA: Diagnosis not present

## 2020-07-17 NOTE — Progress Notes (Signed)
Outside Hospital Records Obtained:  Patient presented with transient aphasia. 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  CTA head and neck :65-68% LICA with superimposed focal ulcerative plaquing; 25% RICA. 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 Right dominant vertebral  TTE with LVEF 65%, wall motion normal, G1DD, mild MR, normal Rv size and systolic function, agitated saline study positive for PFO  TC 153, TG 94, HDL 39, LDL 95, A1C 5.4

## 2020-07-17 NOTE — Progress Notes (Signed)
[name], female   DOB: [birth year], 80 y.o.   MRN: [id number] Patient enrolled for [employer] to ship a 30 day cardiac event monitor to her home.

## 2020-07-17 NOTE — Patient Instructions (Addendum)
Medication Instructions:  Your physician recommends that you continue on your current medications as directed. Please refer to the Current Medication list given to you today.  *If you need a refill on your cardiac medications before your next appointment, please call your pharmacy*   Lab Work: TODAY:  LIPIDS  If you have labs (blood work) drawn today and your tests are completely normal, you will receive your results only by: Marland Kitchen MyChart Message (if you have MyChart) OR . A paper copy in the mail If you have any lab test that is abnormal or we need to change your treatment, we will call you to review the results.   Testing/Procedures: Your physician has requested that you have an echocardiogram. Echocardiography is a painless test that uses sound waves to create images of your heart. It provides your doctor with information about the size and shape of your heart and how well your heart's chambers and valves are working. This procedure takes approximately one hour. There are no restrictions for this procedure.   [employer] Cardiac Event Monitor Instructions Your physician has requested you wear your cardiac event monitor for 30 days, (1-30). [employer] may call or text to confirm a shipping address. The monitor will be sent to a land address via UPS. [employer] will not ship a monitor to a PO BOX. It typically takes 3-5 days to receive your monitor after it has been enrolled. [employer] will assist with USPS tracking if your package is delayed. The telephone number for [employer] is [phone]. Once you have received your monitor, please review the enclosed instructions. Instruction tutorials can also be viewed under help and settings on the enclosed cell phone. Your monitor has already been registered assigning a specific monitor serial # to you.  Applying the monitor Remove cell phone from case and turn it on. The cell phone works as Dealer and needs to be within [employer]  of you at all times. The cell phone will need to be charged on a daily basis. We recommend you plug the cell phone into the enclosed charger at your bedside table every night.  Monitor batteries: You will receive two monitor batteries labelled #1 and #2. These are your recorders. Plug battery #2 onto the second connection on the enclosed charger. Keep one battery on the charger at all times. This will keep the monitor battery deactivated. It will also keep it fully charged for when you need to switch your monitor batteries. A small light will be blinking on the battery emblem when it is charging. The light on the battery emblem will remain on when the battery is fully charged.  Open package of a Monitor strip. Insert battery #1 into black hood on strip and gently squeeze monitor battery onto connection as indicated in instruction booklet. Set aside while preparing skin.  Choose location for your strip, vertical or horizontal, as indicated in the instruction booklet. Shave to remove all hair from location. There cannot be any lotions, oils, powders, or colognes on skin where monitor is to be applied. Wipe skin clean with enclosed Saline wipe. Dry skin completely.  Peel paper labeled #1 off the back of the Monitor strip exposing the adhesive. Place the monitor on the chest in the vertical or horizontal position shown in the instruction booklet. One arrow on the monitor strip must be pointing upward. Carefully remove paper labeled #2, attaching remainder of strip to your skin. Try not to create any folds or wrinkles in the strip as you apply  it.  Firmly press and release the circle in the center of the monitor battery. You will hear a small beep. This is turning the monitor battery on. The heart emblem on the monitor battery will light up every 5 seconds if the monitor battery in turned on and connected to the patient securely. Do not push and hold the circle down as this turns the monitor  battery off. The cell phone will locate the monitor battery. A screen will appear on the cell phone checking the connection of your monitor strip. This may read poor connection initially but change to good connection within the next minute. Once your monitor accepts the connection you will hear a series of 3 beeps followed by a climbing crescendo of beeps. A screen will appear on the cell phone showing the two monitor strip placement options. Touch the picture that demonstrates where you applied the monitor strip.  Your monitor strip and battery are waterproof. You are able to shower, bathe, or swim with the monitor on. They just ask you do not submerge deeper than 3 feet underwater. We recommend removing the monitor if you are swimming in a lake, river, or ocean.  Your monitor battery will need to be switched to a fully charged monitor battery approximately once a week. The cell phone will alert you of an action which needs to be made.  On the cell phone, tap for details to reveal connection status, monitor battery status, and cell phone battery status. The green dots indicates your monitor is in good status. A red dot indicates there is something that needs your attention.  To record a symptom, click the circle on the monitor battery. In 30-60 seconds a list of symptoms will appear on the cell phone. Select your symptom and tap save. Your monitor will record a sustained or significant arrhythmia regardless of you clicking the button. Some patients do not feel the heart rhythm irregularities. [employer] will notify us of any serious or critical events.  Refer to instruction booklet for instructions on switching batteries, changing strips, the Do not disturb or Pause features, or any additional questions.  Call [employer] at [phone], to confirm your monitor is transmitting and record your baseline. They will answer any questions you may have regarding the monitor instructions at that  time.  Returning the monitor to [place] all equipment back into blue box. Peel off strip of paper to expose adhesive and close box securely. There is a prepaid UPS shipping label on this box. Drop in a UPS drop box, or at a UPS facility like [employer]. You may also contact [employer] to arrange UPS to pick up monitor package at your home.   Follow-Up: At [facility], you and your health needs are our priority.  As part of our continuing mission to provide you with exceptional heart care, we have created designated Provider Care Teams.  These Care Teams include your primary Cardiologist (physician) and Advanced Practice Providers (APPs -  Physician Assistants and Nurse Practitioners) who all work together to provide you with the care you need, when you need it.  We recommend signing up for the patient portal called "MyChart".  Sign up information is provided on this After Visit Summary.  MyChart is used to connect with patients for Virtual Visits (Telemedicine).  Patients are able to view lab/test results, encounter notes, upcoming appointments, etc.  Non-urgent messages can be sent to your provider as well.   To learn more about what you can do with  MyChart, go to [email or web address].    Your next appointment:   6 WEEKS   The format for your next appointment:   In Person  Provider:   Gwyndolyn Kaufman, MD   Other Instructions

## 2020-07-18 ENCOUNTER — Telehealth: Payer: Self-pay | Admitting: *Deleted

## 2020-07-18 LAB — LIPID PANEL
Chol/HDL Ratio: 3 ratio (ref 0.0–4.4)
Cholesterol, Total: 158 mg/dL (ref 100–199)
HDL: 53 mg/dL (ref >39)
LDL Chol Calc (NIH): 87 mg/dL (ref 0–99)
Triglycerides: 99 mg/dL (ref 0–149)
VLDL Cholesterol Cal: 18 mg/dL (ref 5–40)

## 2020-07-18 MED ORDER — EZETIMIBE 10 MG PO TABS: 10.0000 mg | ORAL_TABLET | Freq: Every day | ORAL | 3 refills | Status: DC

## 2020-07-18 NOTE — Telephone Encounter (Signed)
-----   Message from Freada Bergeron, MD sent at 07/18/2020 12:52 PM EST ----- Her cholesterol is a little above goal. We want her LDL <70 and she is currently at 87. Can we start zetia 10mg  daily? Thanks so much!

## 2020-07-19 DIAGNOSIS — J4530 Mild persistent asthma, uncomplicated: Secondary | ICD-10-CM | POA: Diagnosis not present

## 2020-07-19 DIAGNOSIS — H6123 Impacted cerumen, bilateral: Secondary | ICD-10-CM | POA: Diagnosis not present

## 2020-07-19 DIAGNOSIS — I10 Essential (primary) hypertension: Secondary | ICD-10-CM | POA: Diagnosis not present

## 2020-07-19 DIAGNOSIS — Z9181 History of falling: Secondary | ICD-10-CM | POA: Diagnosis not present

## 2020-07-19 DIAGNOSIS — J449 Chronic obstructive pulmonary disease, unspecified: Secondary | ICD-10-CM | POA: Diagnosis not present

## 2020-07-19 DIAGNOSIS — Z87891 Personal history of nicotine dependence: Secondary | ICD-10-CM | POA: Diagnosis not present

## 2020-07-19 DIAGNOSIS — Z9981 Dependence on supplemental oxygen: Secondary | ICD-10-CM | POA: Diagnosis not present

## 2020-07-19 DIAGNOSIS — Z8616 Personal history of COVID-19: Secondary | ICD-10-CM | POA: Diagnosis not present

## 2020-07-21 ENCOUNTER — Telehealth: Payer: Self-pay | Admitting: Cardiology

## 2020-07-21 ENCOUNTER — Encounter (INDEPENDENT_AMBULATORY_CARE_PROVIDER_SITE_OTHER): Payer: PPO

## 2020-07-21 ENCOUNTER — Encounter: Payer: Self-pay | Admitting: Cardiology

## 2020-07-21 DIAGNOSIS — I4891 Unspecified atrial fibrillation: Secondary | ICD-10-CM | POA: Diagnosis not present

## 2020-07-21 DIAGNOSIS — I639 Cerebral infarction, unspecified: Secondary | ICD-10-CM | POA: Diagnosis not present

## 2020-07-21 DIAGNOSIS — I6310 Cerebral infarction due to embolism of unspecified precerebral artery: Secondary | ICD-10-CM

## 2020-07-21 NOTE — Telephone Encounter (Signed)
New Message:      Please call, having some problems with the Monitor

## 2020-07-24 DIAGNOSIS — Z8616 Personal history of COVID-19: Secondary | ICD-10-CM

## 2020-07-24 DIAGNOSIS — I10 Essential (primary) hypertension: Secondary | ICD-10-CM

## 2020-07-24 DIAGNOSIS — Z87891 Personal history of nicotine dependence: Secondary | ICD-10-CM

## 2020-07-24 DIAGNOSIS — J4530 Mild persistent asthma, uncomplicated: Secondary | ICD-10-CM

## 2020-07-24 DIAGNOSIS — Z9181 History of falling: Secondary | ICD-10-CM

## 2020-07-24 DIAGNOSIS — J449 Chronic obstructive pulmonary disease, unspecified: Secondary | ICD-10-CM

## 2020-07-24 DIAGNOSIS — Z9981 Dependence on supplemental oxygen: Secondary | ICD-10-CM

## 2020-07-24 DIAGNOSIS — H6123 Impacted cerumen, bilateral: Secondary | ICD-10-CM

## 2020-07-25 NOTE — Telephone Encounter (Signed)
Patient had called tech support and then received a message on the monitor cell phone to turn on WiFi , which she did.  It has been working fine since.  She now has message on phone it is time to switch monitor batteries.  Instructed patient how to access Tutorial videos under Help and Settings.

## 2020-08-08 ENCOUNTER — Encounter: Payer: Self-pay | Admitting: Family Medicine

## 2020-08-08 ENCOUNTER — Other Ambulatory Visit: Payer: Self-pay

## 2020-08-08 ENCOUNTER — Ambulatory Visit (INDEPENDENT_AMBULATORY_CARE_PROVIDER_SITE_OTHER): Payer: PPO | Admitting: Family Medicine

## 2020-08-08 VITALS — BP 108/70 | HR 112 | Temp 99.0°F | Ht 63.0 in | Wt 158.8 lb

## 2020-08-08 DIAGNOSIS — Z8673 Personal history of transient ischemic attack (TIA), and cerebral infarction without residual deficits: Secondary | ICD-10-CM | POA: Diagnosis not present

## 2020-08-08 DIAGNOSIS — J411 Mucopurulent chronic bronchitis: Secondary | ICD-10-CM

## 2020-08-08 DIAGNOSIS — J9601 Acute respiratory failure with hypoxia: Secondary | ICD-10-CM

## 2020-08-08 DIAGNOSIS — U071 COVID-19: Secondary | ICD-10-CM

## 2020-08-08 NOTE — Assessment & Plan Note (Signed)
Pulse Ox 93% on 2L . Discussed trial of 1L with goal >92%. Cont 2L at night for now. She will monitor at home and update as needed for titration advice

## 2020-08-08 NOTE — Progress Notes (Signed)
Subjective:     Nancy Merritt is a 80 y.o. female presenting for Follow-up (4 week )     HPI  #COPD/COVID - continues to use 2 L - pulse ox 93% at home - no sob - does not walk around a lot  - planning to start walking regularly - pulse ox is often low in the morning when she wakes up   Review of Systems  07/11/20: Clinic - COPD worse due to running out of inhalers. Covid on oxygen 07/17/20: Cardiology - post CVA - TTE and monitor, crestor and asa  Social History   Tobacco Use  Smoking Status Former Smoker  . Packs/day: 1.00  . Years: 40.00  . Pack years: 40.00  . Types: Cigarettes  . Quit date: 2002  . Years since quitting: 20.2  Smokeless Tobacco Never Used        Objective:    BP Readings from Last 3 Encounters:  08/08/20 108/70  07/17/20 130/80  07/11/20 130/70   Wt Readings from Last 3 Encounters:  08/08/20 158 lb 12 oz (72 kg)  07/17/20 165 lb 3.2 oz (74.9 kg)  07/11/20 167 lb 12 oz (76.1 kg)    BP 108/70   Pulse (!) 112   Temp 99 F (37.2 C) (Temporal)   Ht 5\' 3"  (1.6 m)   Wt 158 lb 12 oz (72 kg)   SpO2 93%   BMI 28.12 kg/m    Physical Exam Constitutional:      General: She is not in acute distress.    Appearance: She is well-developed. She is not diaphoretic.  HENT:     Right Ear: External ear normal.     Left Ear: External ear normal.  Eyes:     Conjunctiva/sclera: Conjunctivae normal.  Cardiovascular:     Rate and Rhythm: Regular rhythm. Tachycardia present.     Heart sounds: Murmur heard.    Pulmonary:     Effort: Pulmonary effort is normal.     Breath sounds: Wheezing (diffuse) present.     Comments: Wearing oxygen Musculoskeletal:     Cervical back: Neck supple.  Skin:    General: Skin is warm and dry.     Capillary Refill: Capillary refill takes less than 2 seconds.  Neurological:     Mental Status: She is alert. Mental status is at baseline.  Psychiatric:        Mood and Affect: Mood normal.        Behavior:  Behavior normal.           Assessment & Plan:   Problem List Items Addressed This Visit      Respiratory   COPD (chronic obstructive pulmonary disease) ([place]) - Primary    Breathing better than prior. Cont spiriva, breo, and prn albuterol. Encouraged pulm f/u as has not seen them in 2 years. She will call       Acute hypoxemic respiratory failure due to COVID-19 (HCC)    Pulse Ox 93% on 2L [place]. Discussed trial of 1L with goal >92%. Cont 2L at night for now. She will monitor at home and update as needed for titration advice        Other   History of CVA (cerebrovascular accident) without residual deficits    Reviewed cardiology note. Getting ECHO and wearing event monitor in clinic today. Appreciate cardiology support.           Return in about 3 months (around 11/08/2020) for breathing.  Lesleigh Noe, MD  This visit occurred during the SARS-CoV-2 public health emergency.  Safety protocols were in place, including screening questions prior to the visit, additional usage of staff PPE, and extensive cleaning of exam room while observing appropriate contact time as indicated for disinfecting solutions.

## 2020-08-08 NOTE — Assessment & Plan Note (Signed)
Reviewed cardiology note. Getting ECHO and wearing event monitor in clinic today. Appreciate cardiology support.

## 2020-08-08 NOTE — Assessment & Plan Note (Signed)
Breathing better than prior. Cont spiriva, breo, and prn albuterol. Encouraged pulm f/u as has not seen them in 2 years. She will call

## 2020-08-08 NOTE — Patient Instructions (Addendum)
#  Oxygen need - try to decrease the Oxygen 1L O2 during the day, increase to 2L if short of breath or worsening breathing - continue 2L at night   Goal Pulse Ox >92% (ok if occasion 90-92%)  Recommend calling Dr. Loanne Drilling with Pulmonology

## 2020-08-09 ENCOUNTER — Ambulatory Visit (HOSPITAL_COMMUNITY): Payer: PPO | Attending: Internal Medicine

## 2020-08-09 DIAGNOSIS — I252 Old myocardial infarction: Secondary | ICD-10-CM | POA: Insufficient documentation

## 2020-08-09 DIAGNOSIS — Z8616 Personal history of COVID-19: Secondary | ICD-10-CM | POA: Diagnosis not present

## 2020-08-09 DIAGNOSIS — I6310 Cerebral infarction due to embolism of unspecified precerebral artery: Secondary | ICD-10-CM | POA: Diagnosis not present

## 2020-08-09 DIAGNOSIS — I2510 Atherosclerotic heart disease of native coronary artery without angina pectoris: Secondary | ICD-10-CM | POA: Insufficient documentation

## 2020-08-09 DIAGNOSIS — I6389 Other cerebral infarction: Secondary | ICD-10-CM | POA: Diagnosis not present

## 2020-08-09 DIAGNOSIS — I10 Essential (primary) hypertension: Secondary | ICD-10-CM | POA: Diagnosis not present

## 2020-08-09 DIAGNOSIS — Z951 Presence of aortocoronary bypass graft: Secondary | ICD-10-CM | POA: Insufficient documentation

## 2020-08-09 LAB — ECHOCARDIOGRAM COMPLETE BUBBLE STUDY
Area-P 1/2: 3.65 cm2
S' Lateral: 2.2 cm

## 2020-08-11 ENCOUNTER — Telehealth: Payer: Self-pay | Admitting: Cardiology

## 2020-08-11 NOTE — Telephone Encounter (Signed)
Received monitor showing SR with a 3-8 beat run of VT and PVCs with a rate of 195  Called pt and left message to call back.  Showed monitor to Dr. Johney Frame and she said no changes, continue to monitor.

## 2020-08-15 ENCOUNTER — Telehealth: Payer: Self-pay

## 2020-08-15 NOTE — Telephone Encounter (Signed)
Letter completed. Please fax.

## 2020-08-15 NOTE — Telephone Encounter (Signed)
Pt states that she needs a letter from Dr. Einar Pheasant stating that she no longer needs the O2 tanks and concentrator that she has in her home from [place], in order for them to be picked up. Pt now has small, portable unit.  Once letter is typed, it must be faxed to [facility] at [phone] and then let pt know that she can call [employer] and request pick up of equipment.

## 2020-08-15 NOTE — Telephone Encounter (Signed)
Letter faxed. Will advise pt to call [facility] tomorrow to arrange pick up of equipment.

## 2020-08-16 NOTE — Telephone Encounter (Signed)
Spoke to pt's daughter, DPR, and let her know that the letter was faxed and received by [facility] and they should be able to call them now and arrange pickup of equipment. Daughter will call today.

## 2020-08-18 NOTE — Telephone Encounter (Signed)
Left message for patient to call back  

## 2020-08-21 NOTE — Telephone Encounter (Signed)
Pt will see Dr. Johney Frame in clinic on 09/01/20.  Per Dr. Johney Frame, based on recording, no changes, continue to monitor.  Pt made aware of echo results today via triage RN, and advised her to take her baby ASA.  Pt agreed to plan.

## 2020-08-23 ENCOUNTER — Other Ambulatory Visit: Payer: Self-pay | Admitting: Cardiology

## 2020-08-23 DIAGNOSIS — Z87891 Personal history of nicotine dependence: Secondary | ICD-10-CM | POA: Diagnosis not present

## 2020-08-23 DIAGNOSIS — H6123 Impacted cerumen, bilateral: Secondary | ICD-10-CM | POA: Diagnosis not present

## 2020-08-23 DIAGNOSIS — I4891 Unspecified atrial fibrillation: Secondary | ICD-10-CM

## 2020-08-23 DIAGNOSIS — Z9181 History of falling: Secondary | ICD-10-CM | POA: Diagnosis not present

## 2020-08-23 DIAGNOSIS — Z8616 Personal history of COVID-19: Secondary | ICD-10-CM | POA: Diagnosis not present

## 2020-08-23 DIAGNOSIS — Z9981 Dependence on supplemental oxygen: Secondary | ICD-10-CM | POA: Diagnosis not present

## 2020-08-23 DIAGNOSIS — I6310 Cerebral infarction due to embolism of unspecified precerebral artery: Secondary | ICD-10-CM

## 2020-08-23 DIAGNOSIS — I10 Essential (primary) hypertension: Secondary | ICD-10-CM | POA: Diagnosis not present

## 2020-08-23 DIAGNOSIS — J449 Chronic obstructive pulmonary disease, unspecified: Secondary | ICD-10-CM | POA: Diagnosis not present

## 2020-08-23 DIAGNOSIS — J4530 Mild persistent asthma, uncomplicated: Secondary | ICD-10-CM | POA: Diagnosis not present

## 2020-08-26 NOTE — Progress Notes (Deleted)
Cardiology Office Note:    Date:  08/26/2020   ID:  Nancy Merritt, DOB [birth year], MRN [id number]  PCP:  Lesleigh Noe, MD   [facility]  Cardiologist:  No primary care provider on file.  Advanced Practice Provider:  No care team member to display Electrophysiologist:  None   Referring MD: Lesleigh Noe, MD    History of Present Illness:    Nancy Merritt is a 80 y.o. 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  Patient suffered a stroke when she was visiting her [name] in [place] in 04/2020. Specifically, she developed speech difficulties, seemed "out-of-it", and was told she was drooling. MRI confirmed acute stroke. CTA head and neck without acute findings (per report). TTE was performed but unknown results. No known arrhythmias. Symptoms fortunately resolved. She was started on ASA 81mg  and atorvastatin 40mg  daily. Blood pressure has been well controlled at home on lisinopril.   Following her stroke, the patient developed COVID in 05/2020. Noted that her O2 sats were low at 84% with associated fatigue prompting her to go the ER. Now requiring supplemental oxygen due to underlying asthma. She remains on oxygen now.  She also has history of MI found to have multivessel CAD s/p CABG (4V) in [place] years ago.   During our last visit on 07/17/20, we ordered a 30day monitor to assess for Afib given recent CVA, which was negative. We recommended her to see EP, but she declined at that time and wanted to discuss loop further. We also obtained a TTE 08/09/20 which revealed LVEF 60-65%, G1DD, normal RV and systolic function, mild aortic sclerosis, +PFO but unlikely to be source of CVA.  Today,  Past Medical History:  Diagnosis Date  . Arthritis   . Asthma   . Cancer ([place]) 1990  . COVID-19 06/14/2020  . Depression      Past Surgical History:  Procedure Laterality Date  . BREAST SURGERY Left 1990  . CORONARY ARTERY BYPASS GRAFT    . JOINT REPLACEMENT Right    Hip  . OVARIAN CYST REMOVAL  1970  . TUBAL LIGATION  1970    Current Medications: No outpatient medications have been marked as taking for the 09/01/20 encounter (Appointment) with Freada Bergeron, MD.     Allergies:   Advair diskus [fluticasone-salmeterol], Levofloxacin, Qvar [beclomethasone], Singulair [montelukast], and Symbicort [budesonide-formoterol fumarate]   Social History   Socioeconomic History  . Marital status: Widowed    Spouse name: Not on file  . Number of children: 3  . Years of education: 2 associate degrees  . Highest education level: Not on file  Occupational History  . Not on file  Tobacco Use  . Smoking status: Former Smoker    Packs/day: 1.00    Years: 40.00    Pack years: 40.00    Types: Cigarettes    Quit date: 2002    Years since quitting: 20.2  . Smokeless tobacco: Never Used  Vaping Use  . Vaping Use: Never used  Substance and Sexual Activity  . Alcohol use: Not Currently  . Drug use: Never  . Sexual activity: Not Currently  Other Topics Concern  . 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 Difficulty of Paying Living Expenses: Not hard at all  Food Insecurity: No Food Insecurity  . Worried About Charity fundraiser in the Last Year: Never true  . Ran Out of Food in the Last Year: Never true  Transportation Needs: No Transportation Needs  . Lack of Transportation  (Medical): No  . Lack of Transportation (Non-Medical): No  Physical Activity: Insufficiently Active  . Days of Exercise per Week: 3 days  . Minutes of Exercise per Session: 20 min  Stress: No Stress Concern Present  . Feeling of Stress : Not at all  Social Connections: Not on file     Family History: The patient's ***family history includes Alcohol abuse in her brother, daughter, father, and maternal grandfather; Arthritis in her brother, daughter, daughter, daughter, father, maternal grandfather, maternal grandmother, mother, sister, and sister; Asthma in her daughter; Breast cancer (age of onset: 64) in her sister; COPD in her daughter, daughter, father, mother, and sister; Cancer in her father, maternal grandfather, and sister; Depression in her daughter, daughter, daughter, and maternal grandfather; Diabetes in her father and mother; Drug abuse in her sister; Hearing loss in her daughter, maternal grandfather, and mother; Heart attack (age of onset: 53) in her father; Hypertension in her brother, daughter, daughter, daughter, father, maternal grandfather, maternal grandmother, mother, and sister; Miscarriages / Stillbirths in her daughter, mother, and sister; Stroke in her maternal grandmother and mother; Thyroid disease in her daughter, daughter, daughter, maternal grandmother, sister, and sister.  ROS:   Please see the history of present illness.    *** All other systems reviewed and are negative.  EKGs/Labs/Other Studies Reviewed:    The following studies were reviewed today: TTE September 06, 2020: 1. Left ventricular ejection fraction, by estimation, is 60 to 65%. The  left ventricle has normal function. The left ventricle has no regional  wall motion abnormalities. Left ventricular diastolic parameters are  consistent with Grade I diastolic  dysfunction (impaired relaxation).  2. Right ventricular systolic function is normal. The right ventricular  size is normal. There is normal  pulmonary artery systolic pressure. The  estimated right ventricular systolic pressure is 16.1 mmHg.  3. The mitral valve is grossly normal. Trivial mitral valve  regurgitation.  4. The aortic valve is tricuspid. Aortic valve regurgitation is not  visualized. Mild aortic valve sclerosis is present, with no evidence of  aortic valve stenosis.  5. The inferior vena cava is normal in size with greater than 50%  respiratory variability, suggesting right atrial pressure of 3 mmHg.  6. Evidence of possible atrial level shunting suggestive of a PFO - the  atrial septum is aneurysmal. Images do not visualize the left atrium well,  but significant bubbles are seen within 3-4 beats in the left ventricle  suggesting a modest atrial level  shunt. TEE with bubble study recommended for confirmation.   30day monitor 08/23/20:  Patient's monitoring period was from 07/21/2020-08/19/2020  Predominant rhythm was NSR with average HR 85bpm; rnaging from 52-132bpm  There was no Afib  Rare brief runs of NSVT, rare PVCs, rare SVE  No significant pauses or sustained arrhythmias    EKG:  EKG is ***  ordered today.  The ekg ordered today demonstrates ***  Recent Labs: 06/15/2020: B Natriuretic Peptide 47.5 06/20/2020: ALT 31; BUN 24; Creatinine, Ser 0.72; Hemoglobin 13.9; Magnesium 2.2; Platelets 747; Potassium 4.7; Sodium 136  Recent Lipid Panel    Component Value Date/Time   CHOL 158 07/17/2020 1213   TRIG 99 07/17/2020 1213   HDL 53 07/17/2020 1213   CHOLHDL 3.0 07/17/2020 1213   CHOLHDL 4 11/11/2019 1508   VLDL 20.6 11/11/2019 1508   LDLCALC 87 07/17/2020 1213     Risk Assessment/Calculations:   {Does this patient have ATRIAL FIBRILLATION?:[phone]}   Physical Exam:    VS:  There were no vitals taken for this visit.    Wt Readings from Last 3 Encounters:  08/08/20 158 lb 12 oz (72 kg)  07/17/20 165 lb 3.2 oz (74.9 kg)  07/11/20 167 lb 12 oz (76.1 kg)     GEN: *** Well  nourished, well developed in no acute distress HEENT: Normal NECK: No JVD; No carotid bruits LYMPHATICS: No lymphadenopathy CARDIAC: ***RRR, no murmurs, rubs, gallops RESPIRATORY:  Clear to auscultation without rales, wheezing or rhonchi  ABDOMEN: Soft, non-tender, non-distended MUSCULOSKELETAL:  No edema; No deformity  SKIN: Warm and dry NEUROLOGIC:  Alert and oriented x 3 PSYCHIATRIC:  Normal affect   ASSESSMENT:    No diagnosis found. PLAN:    In order of problems listed above:  #Recent CVA: Patient with recent CVA in 04/2020 while visiting her grandson in [place]. Presented with speech changes, facial droop and mental status changes. MRI reportedly showed acute stroke. Work-up in [facility] included CTA head and neck, MRI, TTE and telemetry monitoring. Records not brought today but reportedly normal other than MRI. 30 day monitor here without Afib. TTE with normal BiV function and small PFO (incidental finding). No significant valve disease -Declined EP referral or loop recorder today -PFO in 80 year old unlikely to be cause of CVA; no further work-up needed -ASA 81mg  daily -Crestor 20mg  daily -Zetia 10mg  daily -Repeat lipids with PCP; goal LDL <70  #CAD s/p 4v CABG: Patient with history of MI in [place] 13 years ago. Found to have multivessel CAD. Has had intermittent chest pain that is related to food and does not sound like angina, however, patient is exertionally limited due to SOB/oxygen requirement. No HF symptoms. -Continue ASA and statin as above -TTE 08/09/20 with preserved LVEF, no significant valve disease -Continue lisinopril 20mg  daily  #HTN: Well controlled at home. -Continue lisinopril 20mg  daily  #HLD: -Continue crestor 20mg  daily -Continue zetia 10mg  daily -Repeat lipids with PCP -Goal LDL<70  #COPD: #Asthma: #Recent COVID: Improving and now weaned to 1L [place] as needed during the daytime and continuous at night. Has portable oxygen tank and  monitors her O2 closely at home -Continue home inhalers -Continue supplemental O2 -Follow-up with PCP as scheduled    {Are you ordering a CV Procedure (e.g. stress test, cath, DCCV, TEE, etc)?   Press F2        :[id number]}    Medication Adjustments/Labs and Tests Ordered: Current medicines are reviewed at length with the patient today.  Concerns regarding medicines are outlined above.  No orders of the defined types were placed in this encounter.  No orders of the defined types were placed in this encounter.   There are no Patient Instructions on file for this visit.   Signed, Freada Bergeron, MD  08/26/2020 2:15 PM    [facility]

## 2020-09-01 ENCOUNTER — Ambulatory Visit: Payer: PPO | Admitting: Cardiology

## 2020-09-01 ENCOUNTER — Encounter: Payer: Self-pay | Admitting: Cardiology

## 2020-09-01 ENCOUNTER — Other Ambulatory Visit: Payer: Self-pay

## 2020-09-01 VITALS — BP 126/76 | HR 83 | Ht 63.0 in | Wt 163.0 lb

## 2020-09-01 DIAGNOSIS — I2510 Atherosclerotic heart disease of native coronary artery without angina pectoris: Secondary | ICD-10-CM | POA: Diagnosis not present

## 2020-09-01 DIAGNOSIS — I6310 Cerebral infarction due to embolism of unspecified precerebral artery: Secondary | ICD-10-CM

## 2020-09-01 DIAGNOSIS — J45909 Unspecified asthma, uncomplicated: Secondary | ICD-10-CM | POA: Diagnosis not present

## 2020-09-01 DIAGNOSIS — Z951 Presence of aortocoronary bypass graft: Secondary | ICD-10-CM

## 2020-09-01 DIAGNOSIS — I10 Essential (primary) hypertension: Secondary | ICD-10-CM

## 2020-09-01 DIAGNOSIS — E782 Mixed hyperlipidemia: Secondary | ICD-10-CM

## 2020-09-01 NOTE — Patient Instructions (Signed)
Medication Instructions:  Your physician recommends that you continue on your current medications as directed. Please refer to the Current Medication list given to you today.  *If you need a refill on your cardiac medications before your next appointment, please call your pharmacy*   Lab Work: None If you have labs (blood work) drawn today and your tests are completely normal, you will receive your results only by: Marland Kitchen MyChart Message (if you have MyChart) OR . A paper copy in the mail If you have any lab test that is abnormal or we need to change your treatment, we will call you to review the results.   Testing/Procedures: None   Follow-Up: At [facility], you and your health needs are our priority.  As part of our continuing mission to provide you with exceptional heart care, we have created designated Provider Care Teams.  These Care Teams include your primary Cardiologist (physician) and Advanced Practice Providers (APPs -  Physician Assistants and Nurse Practitioners) who all work together to provide you with the care you need, when you need it.  We recommend signing up for the patient portal called "MyChart".  Sign up information is provided on this After Visit Summary.  MyChart is used to connect with patients for Virtual Visits (Telemedicine).  Patients are able to view lab/test results, encounter notes, upcoming appointments, etc.  Non-urgent messages can be sent to your provider as well.   To learn more about what you can do with MyChart, go to [email or web address].    Your next appointment:   6 month(s)  The format for your next appointment:   In Person  Provider:   You may see Dr. Gwyndolyn Kaufman or one of the following Advanced Practice Providers on your designated Care Team:    Richardson Dopp, PA-C  Robbie Lis, [place]    Other Instructions

## 2020-09-01 NOTE — Progress Notes (Signed)
Cardiology Office Note:    Date:  09/01/2020   ID:  Nancy Merritt, DOB 10-May-1941, MRN [id number]  PCP:  Lesleigh Noe, MD   [place]  Cardiologist:  No primary care provider on file.  Advanced Practice Provider:  No care team member to display Electrophysiologist:  None   Referring MD: Lesleigh Noe, MD    History of Present Illness:    Nancy Merritt is a 80 y.o. 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  Patient suffered a stroke when she was visiting her Nancy Merritt in [place] in 04/2020. Specifically, she developed speech difficulties, seemed "out-of-it", and was told she was drooling. MRI confirmed acute stroke. CTA head and neck without acute findings (per report). TTE was performed but unknown results. No known arrhythmias. Symptoms fortunately resolved. She was started on ASA 81mg  and atorvastatin 40mg  daily. Blood pressure has been well controlled at home on lisinopril.   Following her stroke, the patient developed COVID in 05/2020. Noted that her O2 sats were low at 84% with associated fatigue prompting her to go the ER. Now requiring supplemental oxygen due to underlying asthma. She remains on oxygen now.  She also has history of MI found to have multivessel CAD s/p CABG (4V) in [place] years ago.   During our last visit on 07/17/20, we ordered a 30day monitor to assess for Afib given recent CVA, which was negative. We recommended her to see EP, but she declined at that time and wanted to discuss loop further. We also obtained a TTE 08/09/20 which revealed LVEF 60-65%, G1DD, normal RV and systolic function, mild aortic sclerosis, +PFO but unlikely to be source of CVA.  Today, she states that she is feeling so much better than the last time we saw her. Her stroke symptoms have almost completely improved,  however she does still have some visual blurriness. She plan to set up an appointment with [facility] soon. She is now only requiring oxygen (1L) during the day intermittently and continuously at night. She has no complaints of shortness of breath, palpitations, and chest pain, tightness, or pressure at this time. She is currently taking Aspirin and is tolerating it well however, it causes her to bruise. Her blood pressure is well controlled at home. She states she is becoming more physically active after reassembling her treadmill. She is now walking for 7 minutes, 3 times daily per recommendation of physical therapist. She has no LE edema, PND, or orthopnea.  No dizziness or lightheadedness. We discussed the option of a ILR and she declined again, but states she will continue to think about.    Past Medical History:  Diagnosis Date  . Arthritis   . Asthma   . Cancer ([place]) 1990  . COVID-19 06/14/2020  . Depression     Past Surgical History:  Procedure Laterality Date  . BREAST SURGERY Left 1990  . CORONARY ARTERY BYPASS GRAFT    . JOINT REPLACEMENT Right    Hip  . OVARIAN CYST REMOVAL  1970  . TUBAL LIGATION  1970    Current Medications: Current Meds  Medication Sig  . albuterol (PROVENTIL) (2.5 MG/3ML) 0.083% nebulizer solution INHALE 1 VIAL VIA NEBULIZER 3 TIMES A DAY AS NEEDED  . albuterol (VENTOLIN HFA) 108 (90 Base) MCG/ACT inhaler Inhale 2 puffs into the lungs every 4 (four) hours  as needed for wheezing or shortness of breath.  Marland Kitchen atorvastatin (LIPITOR) 40 MG tablet Take 1 tablet (40 mg total) by mouth daily.  . [employer] ASPIRIN ADULT LOW DOSE 81 MG chewable tablet Chew 81 mg by mouth daily.  Marland Kitchen ezetimibe (ZETIA) 10 MG tablet Take 1 tablet (10 mg total) by mouth daily.  . Fish Oil-Cholecalciferol (FISH OIL + D3 PO) Take 1 capsule by mouth daily.  . fluticasone furoate-vilanterol (BREO ELLIPTA) 200-25 MCG/INH AEPB INHALE 1 PUFF BY MOUTH EVERY DAY  . ibuprofen (ADVIL,MOTRIN) 200 MG tablet  Take 200 mg by mouth every 6 (six) hours as needed for fever or headache.  . loratadine (CLARITIN) 10 MG tablet Take 10 mg by mouth daily.  . Multiple Vitamin (MULTIVITAMIN) tablet Take 1 tablet by mouth daily.  . Tiotropium Bromide Monohydrate (SPIRIVA RESPIMAT) 2.5 MCG/ACT AERS INHALE 2 PUFFS INTO THE LUNGS EVERY DAY     Allergies:   Advair diskus [fluticasone-salmeterol], Levofloxacin, Qvar [beclomethasone], Singulair [montelukast], and Symbicort [budesonide-formoterol fumarate]   Social History   Socioeconomic History  . Marital status: Widowed    Spouse name: Not on file  . Number of children: 3  . Years of education: 2 associate degrees  . Highest education level: Not on file  Occupational History  . Not on file  Tobacco Use  . Smoking status: Former Smoker    Packs/day: 1.00    Years: 40.00    Pack years: 40.00    Types: Cigarettes    Quit date: 2002    Years since quitting: 20.2  . Smokeless tobacco: Never Used  Vaping Use  . Vaping Use: Never used  Substance and Sexual Activity  . Alcohol use: Not Currently  . Drug use: Never  . Sexual activity: Not Currently  Other Topics Concern  . 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 Difficulty of Paying Living Expenses: Not hard at all  Food Insecurity: No Food Insecurity  . Worried About Charity fundraiser in the Last Year: Never true  . Ran Out of Food in the Last Year: Never true  Transportation Needs: No Transportation Needs  . Lack of Transportation (Medical): No  . Lack of Transportation  (Non-Medical): No  Physical Activity: Insufficiently Active  . Days of Exercise per Week: 3 days  . Minutes of Exercise per Session: 20 min  Stress: No Stress Concern Present  . Feeling of Stress : Not at all  Social Connections: Not on file     Family History: The patient's family history includes Alcohol abuse in her brother, daughter, father, and maternal grandfather; Arthritis in her brother, daughter, daughter, daughter, father, maternal grandfather, maternal grandmother, mother, sister, and sister; Asthma in her daughter; Breast cancer (age of onset: 72) in her sister; COPD in her daughter, daughter, father, mother, and sister; Cancer in her father, maternal grandfather, and sister; Depression in her daughter, daughter, daughter, and maternal grandfather; Diabetes in her father and mother; Drug abuse in her sister; Hearing loss in her daughter, maternal grandfather,  and mother; Heart attack (age of onset: 1) in her father; Hypertension in her brother, daughter, daughter, daughter, father, maternal grandfather, maternal grandmother, mother, and sister; Miscarriages / Stillbirths in her daughter, mother, and sister; Stroke in her maternal grandmother and mother; Thyroid disease in her daughter, daughter, daughter, maternal grandmother, sister, and sister.  ROS:   Review of Systems  Constitutional: Negative for unexpected weight change.  HENT: Negative for hearing loss and rhinorrhea.   Eyes: Negative for visual disturbance.  Respiratory: Negative for cough.   Cardiovascular: Negative for chest pain, tightness, or pressure, and leg swelling.  Gastrointestinal: Negative for nausea, vomiting, diarrhea and blood in stool.  Genitourinary: Negative for dysuria and frequency.  Musculoskeletal: Negative for myalgias and arthralgias.  Skin: Negative for rash.  Neurological: Negative for headaches.  Hematological: Negative for adenopathy.  Psychiatric/Behavioral: Denies depression or  anxiety    EKGs/Labs/Other Studies Reviewed:    The following studies were reviewed today: TTE 09/02/2020: 1. Left ventricular ejection fraction, by estimation, is 60 to 65%. The  left ventricle has normal function. The left ventricle has no regional  wall motion abnormalities. Left ventricular diastolic parameters are  consistent with Grade I diastolic  dysfunction (impaired relaxation).  2. Right ventricular systolic function is normal. The right ventricular  size is normal. There is normal pulmonary artery systolic pressure. The  estimated right ventricular systolic pressure is 11.9 mmHg.  3. The mitral valve is grossly normal. Trivial mitral valve  regurgitation.  4. The aortic valve is tricuspid. Aortic valve regurgitation is not  visualized. Mild aortic valve sclerosis is present, with no evidence of  aortic valve stenosis.  5. The inferior vena cava is normal in size with greater than 50%  respiratory variability, suggesting right atrial pressure of 3 mmHg.  6. Evidence of possible atrial level shunting suggestive of a PFO - the  atrial septum is aneurysmal. Images do not visualize the left atrium well,  but significant bubbles are seen within 3-4 beats in the left ventricle  suggesting a modest atrial level  shunt. TEE with bubble study recommended for confirmation.   30day monitor 08/23/20:  Patient's monitoring period was from 07/21/2020-08/19/2020  Predominant rhythm was NSR with average HR 85bpm; rnaging from 52-132bpm  There was no Afib  Rare brief runs of NSVT, rare PVCs, rare SVE  No significant pauses or sustained arrhythmias    EKG:   09/01/20- No EKG was ordered today.    Recent Labs: 06/15/2020: B Natriuretic Peptide 47.5 06/20/2020: ALT 31; BUN 24; Creatinine, Ser 0.72; Hemoglobin 13.9; Magnesium 2.2; Platelets 747; Potassium 4.7; Sodium 136  Recent Lipid Panel    Component Value Date/Time   CHOL 158 07/17/2020 1213   TRIG 99 07/17/2020 1213    HDL 53 07/17/2020 1213   CHOLHDL 3.0 07/17/2020 1213   CHOLHDL 4 11/11/2019 1508   VLDL 20.6 11/11/2019 1508   LDLCALC 87 07/17/2020 1213      Physical Exam:    VS:  BP 126/76   Pulse 83   Ht 5\' 3"  (1.6 m)   Wt 163 lb (73.9 kg)   SpO2 97%   BMI 28.87 kg/m     Wt Readings from Last 3 Encounters:  09/01/20 163 lb (73.9 kg)  08/08/20 158 lb 12 oz (72 kg)  07/17/20 165 lb 3.2 oz (74.9 kg)     GEN: Well nourished, well developed in no acute distress HEENT: Normal NECK: No JVD; No carotid bruits LYMPHATICS: No lymphadenopathy CARDIAC: RRR, no murmurs,  rubs, gallops RESPIRATORY:  Clear to auscultation without rales, wheezing or rhonchi  ABDOMEN: Soft, non-tender, non-distended MUSCULOSKELETAL:  No edema; No deformity  SKIN: Warm and dry NEUROLOGIC:  Alert and oriented x 3 PSYCHIATRIC:  Normal affect   ASSESSMENT:    1. Cerebrovascular accident (CVA) due to embolism of precerebral artery ([place])   2. Coronary artery disease involving native coronary artery of native heart without angina pectoris   3. Primary hypertension   4. Status post four vessel coronary artery bypass   5. Asthma, unspecified asthma severity, unspecified whether complicated, unspecified whether persistent   6. Mixed hyperlipidemia    PLAN:    In order of problems listed above:  #Recent CVA: Patient with recent CVA in 04/2020 while visiting her grandson in [place]. Presented with speech changes, facial droop and mental status changes. MRI reportedly showed acute stroke. Work-up in [facility] included CTA head and neck, MRI, TTE and telemetry monitoring. Records not brought today but reportedly normal other than MRI. 30 day monitor here without Afib. TTE with normal BiV function and small PFO (incidental finding). No significant valve disease -Declined EP referral or loop recorder today after long discussion, she understands she is possibly at increased risk of stroke but states she "80 years old and okay  with living without further interventions knowing her risk of stroke may be increased" -PFO in 80 year old unlikely to be cause of CVA; no further work-up needed -ASA 81mg  daily -Crestor 20mg  daily -Zetia 10mg  daily -Repeat lipids with PCP; goal LDL <70  #CAD s/p 4v CABG: Patient with history of MI in [place] 13 years ago. Found to have multivessel CAD now s/p CABG as above. No anginal symptoms. TTE with preserved LVEF. - Continue ASA and statin as above -TTE 08/09/20 with preserved LVEF, no significant valve disease -Continue lisinopril 20mg  daily  #HTN: Well controlled at home. -Continue lisinopril 20mg  daily  #HLD: -Continue crestor 20mg  daily -Continue zetia 10mg  daily -Repeat lipids with PCP -Goal LDL<70  #COPD: #Asthma: #Recent COVID: Improving and now weaned to 1L [place] as needed during the daytime and continuous at night. Has portable oxygen tank and monitors her O2 closely at home -Continue home inhalers -Continue supplemental O2 -Follow-up with PCP as scheduled    Medication Adjustments/Labs and Tests Ordered: Current medicines are reviewed at length with the patient today.  Concerns regarding medicines are outlined above.  No orders of the defined types were placed in this encounter.  No orders of the defined types were placed in this encounter.   Patient Instructions  Medication Instructions:  Your physician recommends that you continue on your current medications as directed. Please refer to the Current Medication list given to you today.  *If you need a refill on your cardiac medications before your next appointment, please call your pharmacy*   Lab Work: None If you have labs (blood work) drawn today and your tests are completely normal, you will receive your results only by: Marland Kitchen MyChart Message (if you have MyChart) OR . A paper copy in the mail If you have any lab test that is abnormal or we need to change your treatment, we will call you  to review the results.   Testing/Procedures: None   Follow-Up: At [facility], you and your health needs are our priority.  As part of our continuing mission to provide you with exceptional heart care, we have created designated Provider Care Teams.  These Care Teams include your primary Cardiologist (physician) and Advanced  Practice Providers (APPs -  Physician Assistants and Nurse Practitioners) who all work together to provide you with the care you need, when you need it.  We recommend signing up for the patient portal called "MyChart".  Sign up information is provided on this After Visit Summary.  MyChart is used to connect with patients for Virtual Visits (Telemedicine).  Patients are able to view lab/test results, encounter notes, upcoming appointments, etc.  Non-urgent messages can be sent to your provider as well.   To learn more about what you can do with MyChart, go to [email or web address].    Your next appointment:   6 month(s)  The format for your next appointment:   In Person  Provider:   You may see Dr. Gwyndolyn Kaufman or one of the following Advanced Practice Providers on your designated Care Team:    Richardson Dopp, PA-C  [place], [place]    Other Instructions      Follow up in 6 months.  I,Alexis Bryant,acting as a Education administrator for Freada Bergeron, MD.,have documented all relevant documentation on the behalf of Freada Bergeron, MD,as directed by  Freada Bergeron, MD while in the presence of Freada Bergeron, MD.  I, Freada Bergeron, MD, have reviewed all documentation for this visit. The documentation on 09/01/20 for the exam, diagnosis, procedures, and orders are all accurate and complete.  Signed, Freada Bergeron, MD  09/01/2020 10:28 AM    [place]

## 2020-10-17 ENCOUNTER — Other Ambulatory Visit (HOSPITAL_COMMUNITY): Payer: Self-pay

## 2020-12-14 ENCOUNTER — Telehealth: Payer: Self-pay | Admitting: Family Medicine

## 2020-12-14 NOTE — Chronic Care Management (AMB) (Signed)
  Chronic Care Management   Outreach Note  12/14/2020 Name: Nancy Merritt MRN: [id number] DOB: 09/26/1940  Referred by: Lesleigh Noe, MD Reason for referral : No chief complaint on file.   An unsuccessful telephone outreach was attempted today. The patient was referred to the pharmacist for assistance with care management and care coordination.   Follow Up Plan:  Tatjana Dellinger Upstream Scheduler

## 2020-12-21 ENCOUNTER — Telehealth: Payer: Self-pay | Admitting: Family Medicine

## 2020-12-21 NOTE — Chronic Care Management (AMB) (Signed)
  Chronic Care Management   Outreach Note  12/21/2020 Name: Nancy Merritt MRN: [id number] DOB: 1940/12/01  Referred by: Lesleigh Noe, MD Reason for referral : No chief complaint on file.   A second unsuccessful telephone outreach was attempted today. The patient was referred to pharmacist for assistance with care management and care coordination.  Follow Up Plan:   Tatjana Dellinger Upstream Scheduler

## 2020-12-29 ENCOUNTER — Telehealth: Payer: Self-pay | Admitting: Family Medicine

## 2020-12-29 NOTE — Chronic Care Management (AMB) (Signed)
  Chronic Care Management   Outreach Note  12/29/2020 Name: Nancy Merritt MRN: [id number] DOB: Jan 04, 1941  Referred by: Lesleigh Noe, MD Reason for referral : No chief complaint on file.   Third unsuccessful telephone outreach was attempted today. The patient was referred to the pharmacist for assistance with care management and care coordination.   Follow Up Plan:   Tatjana Dellinger Upstream Scheduler

## 2021-05-23 ENCOUNTER — Telehealth: Payer: Self-pay | Admitting: Family Medicine

## 2021-05-23 DIAGNOSIS — I10 Essential (primary) hypertension: Secondary | ICD-10-CM | POA: Diagnosis not present

## 2021-05-23 DIAGNOSIS — Z9114 Patient's other noncompliance with medication regimen: Secondary | ICD-10-CM | POA: Diagnosis not present

## 2021-05-23 DIAGNOSIS — R600 Localized edema: Secondary | ICD-10-CM | POA: Diagnosis not present

## 2021-05-23 DIAGNOSIS — Z91119 Patient's noncompliance with dietary regimen due to unspecified reason: Secondary | ICD-10-CM | POA: Diagnosis not present

## 2021-05-23 NOTE — Telephone Encounter (Signed)
Syna with [facility] [place] called stating that pt was Non Compliant with her BP medicine and Diet.

## 2021-05-29 ENCOUNTER — Telehealth: Payer: Self-pay | Admitting: Family Medicine

## 2021-05-29 DIAGNOSIS — D692 Other nonthrombocytopenic purpura: Secondary | ICD-10-CM | POA: Diagnosis not present

## 2021-05-29 DIAGNOSIS — J449 Chronic obstructive pulmonary disease, unspecified: Secondary | ICD-10-CM | POA: Diagnosis not present

## 2021-05-29 DIAGNOSIS — I10 Essential (primary) hypertension: Secondary | ICD-10-CM | POA: Diagnosis not present

## 2021-05-29 NOTE — Telephone Encounter (Signed)
NP brandy called in from [facility] due she has was seen uncontrolled hypertension and edema, on 1/4 178/90 and she was not using her lisinopril, but in the last week she has been taking has been linsinopril, and the swelling is resolved but her BP was still elevated and the reading today was 178/104

## 2021-05-30 ENCOUNTER — Telehealth: Payer: Self-pay | Admitting: Family Medicine

## 2021-05-30 NOTE — Telephone Encounter (Addendum)
It may take 1 to 2 weeks for her blood pressure to return to baseline after resuming blood pressure medication. Historically it appears that her blood pressure is well controlled on lisinopril 20 mg alone.  We are happy to see her in the office in [place] or [place] now for her blood pressure concerns. Also needs general follow-up with PCP scheduled for when PCP returns.  Needs ED evaluation for chest pain, shortness of breath, nausea/vomiting.  Please reiterate to patient that she needs to take her lisinopril 20 mg every day.  BP Readings from Last 3 Encounters:  09/01/20 126/76  08/08/20 108/70  07/17/20 130/80

## 2021-05-30 NOTE — Telephone Encounter (Signed)
LVM for pt to rtn my call to schedule AWV with [facility].  

## 2021-05-31 NOTE — Telephone Encounter (Signed)
Spoke to pt and relayed [employer]. Pt states that she is taking her lisinopril consistently now and her BP this morning before taking meds, was 113/67. Pt feels good. Pt will be scheduling her AMW very soon. She got the call today to schedule her nurse call for her MW.

## 2021-05-31 NOTE — Telephone Encounter (Signed)
Noted, very glad to hear that her blood pressure has returned to normal range.

## 2021-06-01 NOTE — Telephone Encounter (Signed)
I have spoken to pt about this and she is back on her lisinopril daily. See other phone note.

## 2021-06-22 NOTE — Progress Notes (Signed)
Subjective:   Nancy Merritt is a 81 y.o. female who presents for Medicare Annual (Subsequent) preventive examination.  I connected with [name] today by telephone and verified that I am speaking with the correct person using two identifiers. Location patient: home Location provider: work Persons participating in the virtual visit: patient, Marine scientist.    I discussed the limitations, risks, security and privacy concerns of performing an evaluation and management service by telephone and the availability of in person appointments. I also discussed with the patient that there may be a patient responsible charge related to this service. The patient expressed understanding and verbally consented to this telephonic visit.    Interactive audio and video telecommunications were attempted between this provider and patient, however failed, due to patient having technical difficulties OR patient did not have access to video capability.  We continued and completed visit with audio only.  Some vital signs may be absent or patient reported.   Time Spent with patient on telephone encounter: 20 minutes  Review of Systems     Cardiac Risk Factors include: advanced age (>32men, >24 women);hypertension     Objective:    Today's Vitals   06/25/21 0946  Weight: 163 lb (73.9 kg)  Height: 5\' 3"  (1.6 m)   Body mass index is 28.87 kg/m.  Advanced Directives 06/25/2021 06/16/2020 06/15/2020 11/18/2019  Does Patient Have a Medical Advance Directive? Yes Yes No Yes  Type of Paramedic of [place];Living will Healthcare Power of [place];Living will  Does patient want to make changes to medical advance directive? Yes (MAU/Ambulatory/Procedural Areas - Information given) No - Patient declined - -  Copy of [place] in Chart? Yes - validated most recent copy scanned in chart (See row information) Yes - validated most recent copy  scanned in chart (See row information) - No - copy requested  Would patient like information on creating a medical advance directive? - - No - Patient declined -    Current Medications (verified) Outpatient Encounter Medications as of 06/25/2021  Medication Sig   albuterol (PROVENTIL) (2.5 MG/3ML) 0.083% nebulizer solution INHALE 1 VIAL VIA NEBULIZER 3 TIMES A DAY AS NEEDED   albuterol (VENTOLIN HFA) 108 (90 Base) MCG/ACT inhaler Inhale 2 puffs into the lungs every 4 (four) hours as needed for wheezing or shortness of breath.   aspirin 325 MG tablet Take 325 mg by mouth daily.   FLAXSEED, LINSEED, PO Take by mouth.   fluticasone furoate-vilanterol (BREO ELLIPTA) 200-25 MCG/INH AEPB INHALE 1 PUFF BY MOUTH EVERY DAY   ibuprofen (ADVIL,MOTRIN) 200 MG tablet Take 200 mg by mouth every 6 (six) hours as needed for fever or headache.   loratadine (CLARITIN) 10 MG tablet Take 10 mg by mouth daily.   Multiple Vitamin (MULTIVITAMIN) tablet Take 1 tablet by mouth daily.   Tiotropium Bromide Monohydrate (SPIRIVA RESPIMAT) 2.5 MCG/ACT AERS INHALE 2 PUFFS INTO THE LUNGS EVERY DAY   atorvastatin (LIPITOR) 40 MG tablet Take 1 tablet (40 mg total) by mouth daily. (Patient not taking: Reported on 06/25/2021)   [facility] ASPIRIN ADULT LOW DOSE 81 MG chewable tablet Chew 81 mg by mouth daily. (Patient not taking: Reported on 06/25/2021)   ezetimibe (ZETIA) 10 MG tablet Take 1 tablet (10 mg total) by mouth daily. (Patient not taking: Reported on 06/25/2021)   Fish Oil-Cholecalciferol (FISH OIL + D3 PO) Take 1 capsule by mouth daily. (Patient not taking: Reported on 06/25/2021)   lisinopril (ZESTRIL)  20 MG tablet Take 1 tablet (20 mg total) by mouth daily.   No facility-administered encounter medications on file as of 06/25/2021.    Allergies (verified) Advair diskus [fluticasone-salmeterol], Levofloxacin, Qvar [beclomethasone], Singulair [montelukast], and Symbicort [budesonide-formoterol fumarate]   History: Past Medical  History:  Diagnosis Date   Arthritis    Asthma    Cancer ([place]) 1990   COVID-19 06/14/2020   Depression    Past Surgical History:  Procedure Laterality Date   BREAST SURGERY Left 1990   CORONARY ARTERY BYPASS GRAFT     JOINT REPLACEMENT Right    Hip   OVARIAN CYST REMOVAL  1970   TUBAL LIGATION  1970   Family History  Problem Relation Age of Onset   Cancer Father        lung   Heart attack Father 71   Alcohol abuse Father    Arthritis Father    COPD Father    Diabetes Father    Hypertension Father    Breast cancer Sister 9   Arthritis Sister    Cancer Sister    COPD Sister    Drug abuse Sister    Hypertension Sister    60 / [place] Sister    Thyroid disease Sister    Arthritis Mother    COPD Mother    Diabetes Mother    Hearing loss Mother    Hypertension Mother    Miscarriages / [place] Mother    Stroke Mother    Alcohol abuse Brother    Arthritis Brother    Hypertension Brother    Arthritis Daughter    Asthma Daughter    Depression Daughter    Hypertension Daughter    Miscarriages / [place] Daughter    Thyroid disease Daughter    Arthritis Maternal Grandmother    Hypertension Maternal Grandmother    Stroke Maternal Grandmother    Thyroid disease Maternal Grandmother    Alcohol abuse Maternal Grandfather    Arthritis Maternal Grandfather    Cancer Maternal Grandfather    Depression Maternal Grandfather    Hearing loss Maternal Grandfather    Hypertension Maternal Grandfather    Arthritis Sister    Thyroid disease Sister    Thyroid disease Daughter    Hypertension Daughter    Depression Daughter    COPD Daughter    Alcohol abuse Daughter    Arthritis Daughter    Arthritis Daughter    COPD Daughter    Depression Daughter    Hearing loss Daughter    Hypertension Daughter    Thyroid disease Daughter    Social History   Socioeconomic History   Marital status: Widowed    Spouse name: Not on file   Number of children: 3    Years of education: 2 associate degrees   Highest education level: Not on file  Occupational History   Not on file  Tobacco Use   Smoking status: Former    Packs/day: 1.00    Years: 40.00    Pack years: 40.00    Types: Cigarettes    Quit date: 2002    Years since quitting: 21.1   Smokeless tobacco: Never  Vaping Use   Vaping Use: Never used  Substance and Sexual Activity   Alcohol use: Not Currently   Drug use: Never   Sexual activity: Not Currently  Other Topics Concern   Not on file  Social History Narrative   Moved from [place] with Daughter, [name]  (one daughter in [place]  and the other in [place]) - 5 granddaughters and 2 great-grandchildren   Enjoys needle work, reading, cook   Exercise: not really, can walk 1/2 a mile at slow pace w/o getting SOB, SOB with stairs   Sleeps on the ground floor   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)   Social Determinants of Radio broadcast assistant Strain: Low Risk    Difficulty of Paying Living Expenses: Not hard at all  Food Insecurity: No Food Insecurity   Worried About Charity fundraiser in the Last Year: Never true   Arboriculturist in the Last Year: Never true  Transportation Needs: No Transportation Needs   Lack of Transportation (Medical): No   Lack of Transportation (Non-Medical): No  Physical Activity: Inactive   Days of Exercise per Week: 0 days   Minutes of Exercise per Session: 0 min  Stress: No Stress Concern Present   Feeling of Stress : Not at all  Social Connections: Moderately Isolated   Frequency of Communication with Friends and Family: More than three times a week   Frequency of Social Gatherings with Friends and Family: More than three times a week   Attends Religious Services: More than 4 times per year   Active Member of [employer] or Organizations: No   Attends Archivist Meetings: Never   Marital Status: Widowed    Tobacco Counseling Counseling given: Not  Answered   Clinical Intake:  Pre-visit preparation completed: Yes  Pain : No/denies pain     BMI - recorded: 28.87 Nutritional Status: BMI 25 -29 Overweight Nutritional Risks: None Diabetes: No  How often do you need to have someone help you when you read instructions, pamphlets, or other written materials from your doctor or pharmacy?: 1 - Never  Diabetic? No  Interpreter Needed?: No  Information entered by :: Orrin Brigham LPN   Activities of Daily Living In your present state of health, do you have any difficulty performing the following activities: 06/25/2021  Hearing? Y  Comment wears hearing aids  Vision? N  Difficulty concentrating or making decisions? N  Walking or climbing stairs? N  Dressing or bathing? N  Doing errands, shopping? N  Preparing Food and eating ? N  Using the Toilet? N  In the past six months, have you accidently leaked urine? Y  Comment when coughing or laughing , wears pads  Do you have problems with loss of bowel control? N  Managing your Medications? N  Managing your Finances? N  Housekeeping or managing your Housekeeping? N  Some recent data might be hidden    Patient Care Team: Lesleigh Noe, MD as PCP - General (Family Medicine) Margaretha Seeds, MD as Consulting Physician (Pulmonary Disease)  Indicate any recent Medical Services you may have received from other than [facility] providers in the past year (date may be approximate).     Assessment:   This is a routine wellness examination for [place].  Hearing/Vision screen Hearing Screening - Comments:: Wears hearing aids Vision Screening - Comments:: Last exam over a year ago, plans to make an appointment   Dietary issues and exercise activities discussed: Current Exercise Habits: The patient does not participate in regular exercise at present   Goals Addressed             This Visit's Progress    Patient Stated       Would like to maintain current routine and add  exercise       Depression Screen  PHQ 2/9 Scores 06/25/2021 05/18/2020 11/18/2019 11/11/2019 11/09/2018 05/26/2018 04/07/2018  PHQ - 2 Score 0 0 0 0 0 0 6  PHQ- 9 Score - - 0 2 0 4 18    Fall Risk Fall Risk  06/25/2021 05/18/2020 11/18/2019 11/09/2018 05/26/2018  Falls in the past year? 0 0 0 0 0  Number falls in past yr: 0 0 0 0 0  Injury with Fall? 0 0 0 0 0  Risk for fall due to : No Fall Risks - Medication side effect - -  Follow up Falls prevention discussed Falls evaluation completed Falls evaluation completed;Falls prevention discussed - -    FALL RISK PREVENTION PERTAINING TO THE HOME:  Any stairs in or around the home? Yes  If so, are there any without handrails? No  Home free of loose throw rugs in walkways, pet beds, electrical cords, etc? Yes  Adequate lighting in your home to reduce risk of falls? Yes   ASSISTIVE DEVICES UTILIZED TO PREVENT FALLS:  Life alert? No  Use of a cane, walker or w/c? No  Grab bars in the bathroom? Yes  Shower chair or bench in shower? Yes  Elevated toilet seat or a handicapped toilet? No   TIMED UP AND GO:  Was the test performed? No .    Cognitive Function: Normal cognitive status assessed by this Nurse Health Advisor. No abnormalities found.   MMSE - Mini Mental State Exam 11/18/2019  Not completed: Refused     6CIT Screen 11/11/2019  What Year? 4 points    Immunizations Immunization History  Administered Date(s) Administered   Influenza, High Dose Seasonal PF 04/07/2018   Influenza,inj,Quad PF,6+ Mos 05/19/2019   Pneumococcal Conjugate-13 11/09/2018   Pneumococcal Polysaccharide-23 08/15/2015   Tdap 08/15/2015   Zoster, Live 08/15/2015    TDAP status: Up to date  Flu Vaccine status: Declined, Education has been provided regarding the importance of this vaccine but patient still declined. Advised may receive this vaccine at local pharmacy or Health Dept. Aware to provide a copy of the vaccination record if obtained from local  pharmacy or Health Dept. Verbalized acceptance and understanding.  Pneumococcal vaccine status: Up to date  Covid-19 vaccine status: Declined, Education has been provided regarding the importance of this vaccine but patient still declined. Advised may receive this vaccine at local pharmacy or Health Dept.or vaccine clinic. Aware to provide a copy of the vaccination record if obtained from local pharmacy or Health Dept. Verbalized acceptance and understanding.  Qualifies for Shingles Vaccine? Yes   Zostavax completed Yes   Shingrix Completed?: No.    Education has been provided regarding the importance of this vaccine. Patient has been advised to call insurance company to determine out of pocket expense if they have not yet received this vaccine. Advised may also receive vaccine at local pharmacy or Health Dept. Verbalized acceptance and understanding.  Screening Tests Health Maintenance  Topic Date Due   COVID-19 Vaccine (1) Never done   Zoster Vaccines- Shingrix (1 of 2) Never done   INFLUENZA VACCINE  12/18/2020   TETANUS/TDAP  08/14/2025   Pneumonia Vaccine 33+ Years old  Completed   HPV VACCINES  Aged Out   DEXA SCAN  Discontinued    Health Maintenance  Health Maintenance Due  Topic Date Due   COVID-19 Vaccine (1) Never done   Zoster Vaccines- Shingrix (1 of 2) Never done   INFLUENZA VACCINE  12/18/2020    Colorectal cancer screening: No longer required.   Mammogram  status: patient declined today, plans on discussing with PCP when decided   Bone Density status: patient declined today, plans on discussing with PCP when decided   Lung Cancer Screening: (Low Dose CT Chest recommended if Age 6-80 years, 30 pack-year currently smoking OR have quit w/in 15years.) does qualify. Patient will discuss with PCP    Additional Screening:  Hepatitis C Screening: does not qualify  Vision Screening: Recommended annual ophthalmology exams for early detection of glaucoma and other  disorders of the eye. Is the patient up to date with their annual eye exam?  No  Who is the provider or what is the name of the office in which the patient attends annual eye exams? Provider information unavailable    Dental Screening: Recommended annual dental exams for proper oral hygiene  Community Resource Referral / Chronic Care Management: CRR required this visit?  No   CCM required this visit?  No      Plan:     I have personally reviewed and noted the following in the patients chart:   Medical and social history Use of alcohol, tobacco or illicit drugs  Current medications and supplements including opioid prescriptions.  Functional ability and status Nutritional status Physical activity Advanced directives List of other physicians Hospitalizations, surgeries, and ER visits in previous 12 months Vitals Screenings to include cognitive, depression, and falls Referrals and appointments  In addition, I have reviewed and discussed with patient certain preventive protocols, quality metrics, and best practice recommendations. A written personalized care plan for preventive services as well as general preventive health recommendations were provided to patient.   Due to this being a telephonic visit, the after visit summary with patients personalized plan was offered to patient via mail or my-chart.  Patient preferred to pick up at office at next visit.   Loma Messing, LPN   12/18/1029   Nurse Health Advisor  Nurse Notes: none

## 2021-06-25 ENCOUNTER — Ambulatory Visit (INDEPENDENT_AMBULATORY_CARE_PROVIDER_SITE_OTHER): Payer: PPO

## 2021-06-25 VITALS — Ht 63.0 in | Wt 163.0 lb

## 2021-06-25 DIAGNOSIS — Z0000 Encounter for general adult medical examination without abnormal findings: Secondary | ICD-10-CM | POA: Diagnosis not present

## 2021-06-25 NOTE — Patient Instructions (Addendum)
Nancy Merritt , Thank you for taking time to complete your Medicare Wellness Visit. I appreciate your ongoing commitment to your health goals. Please review the following plan we discussed and let me know if I can assist you in the future.   Screening recommendations/referrals: Colonoscopy: no longer required  Mammogram: due, discuss with PCP if you change your mind Bone Density: due, discuss with PCP if you change your mind Recommended yearly ophthalmology/optometry visit for glaucoma screening and checkup Recommended yearly dental visit for hygiene and checkup  Vaccinations: Influenza vaccine: Due-May obtain vaccine at our office or your local pharmacy. Pneumococcal vaccine: up to date  Tdap vaccine: up to date, completed 08/15/15, due 08/14/25 Shingles vaccine:-May obtain vaccine at your local pharmacy. Covid-19:newest booster available at your local pharmacy  Advanced directives: copy on file  Conditions/risks identified: see problem list   Next appointment: Follow up in one year for your annual wellness visit 06/26/22 @ 9:45am, this will be a telephone visit   Preventive Care 81 Years and Older, Female Preventive care refers to lifestyle choices and visits with your health care provider that can promote health and wellness. What does preventive care include? A yearly physical exam. This is also called an annual well check. Dental exams once or twice a year. Routine eye exams. Ask your health care provider how often you should have your eyes checked. Personal lifestyle choices, including: Daily care of your teeth and gums. Regular physical activity. Eating a healthy diet. Avoiding tobacco and drug use. Limiting alcohol use. Practicing safe sex. Taking low-dose aspirin every day. Taking vitamin and mineral supplements as recommended by your health care provider. What happens during an annual well check? The services and screenings done by your health care provider during your  annual well check will depend on your age, overall health, lifestyle risk factors, and family history of disease. Counseling  Your health care provider may ask you questions about your: Alcohol use. Tobacco use. Drug use. Emotional well-being. Home and relationship well-being. Sexual activity. Eating habits. History of falls. Memory and ability to understand (cognition). Work and work Statistician. Reproductive health. Screening  You may have the following tests or measurements: Height, weight, and BMI. Blood pressure. Lipid and cholesterol levels. These may be checked every 5 years, or more frequently if you are over 65 years old. Skin check. Lung cancer screening. You may have this screening every year starting at age 80 if you have a 30-pack-year history of smoking and currently smoke or have quit within the past 15 years. Fecal occult blood test (FOBT) of the stool. You may have this test every year starting at age 74. Flexible sigmoidoscopy or colonoscopy. You may have a sigmoidoscopy every 5 years or a colonoscopy every 10 years starting at age 13. Hepatitis C blood test. Hepatitis B blood test. Sexually transmitted disease (STD) testing. Diabetes screening. This is done by checking your blood sugar (glucose) after you have not eaten for a while (fasting). You may have this done every 1-3 years. Bone density scan. This is done to screen for osteoporosis. You may have this done starting at age 58. Mammogram. This may be done every 1-2 years. Talk to your health care provider about how often you should have regular mammograms. Talk with your health care provider about your test results, treatment options, and if necessary, the need for more tests. Vaccines  Your health care provider may recommend certain vaccines, such as: Influenza vaccine. This is recommended every year. Tetanus, diphtheria, and  acellular pertussis (Tdap, Td) vaccine. You may need a Td booster every 10  years. Zoster vaccine. You may need this after age 12. Pneumococcal 13-valent conjugate (PCV13) vaccine. One dose is recommended after age 3. Pneumococcal polysaccharide (PPSV23) vaccine. One dose is recommended after age 56. Talk to your health care provider about which screenings and vaccines you need and how often you need them. This information is not intended to replace advice given to you by your health care provider. Make sure you discuss any questions you have with your health care provider. Document Released: 06/02/2015 Document Revised: 01/24/2016 Document Reviewed: 03/07/2015 Elsevier Interactive Patient Education  2017 [place] Prevention in the Home Falls can cause injuries. They can happen to people of all ages. There are many things you can do to make your home safe and to help prevent falls. What can I do on the outside of my home? Regularly fix the edges of walkways and driveways and fix any cracks. Remove anything that might make you trip as you walk through a door, such as a raised step or threshold. Trim any bushes or trees on the path to your home. Use bright outdoor lighting. Clear any walking paths of anything that might make someone trip, such as rocks or tools. Regularly check to see if handrails are loose or broken. Make sure that both sides of any steps have handrails. Any raised decks and porches should have guardrails on the edges. Have any leaves, snow, or ice cleared regularly. Use sand or salt on walking paths during winter. Clean up any spills in your garage right away. This includes oil or grease spills. What can I do in the bathroom? Use night lights. Install grab bars by the toilet and in the tub and shower. Do not use towel bars as grab bars. Use non-skid mats or decals in the tub or shower. If you need to sit down in the shower, use a plastic, non-slip stool. Keep the floor dry. Clean up any water that spills on the floor as soon as it  happens. Remove soap buildup in the tub or shower regularly. Attach bath mats securely with double-sided non-slip rug tape. Do not have throw rugs and other things on the floor that can make you trip. What can I do in the bedroom? Use night lights. Make sure that you have a light by your bed that is easy to reach. Do not use any sheets or blankets that are too big for your bed. They should not hang down onto the floor. Have a firm chair that has side arms. You can use this for support while you get dressed. Do not have throw rugs and other things on the floor that can make you trip. What can I do in the kitchen? Clean up any spills right away. Avoid walking on wet floors. Keep items that you use a lot in easy-to-reach places. If you need to reach something above you, use a strong step stool that has a grab bar. Keep electrical cords out of the way. Do not use floor polish or wax that makes floors slippery. If you must use wax, use non-skid floor wax. Do not have throw rugs and other things on the floor that can make you trip. What can I do with my stairs? Do not leave any items on the stairs. Make sure that there are handrails on both sides of the stairs and use them. Fix handrails that are broken or loose. Make sure that  handrails are as long as the stairways. Check any carpeting to make sure that it is firmly attached to the stairs. Fix any carpet that is loose or worn. Avoid having throw rugs at the top or bottom of the stairs. If you do have throw rugs, attach them to the floor with carpet tape. Make sure that you have a light switch at the top of the stairs and the bottom of the stairs. If you do not have them, ask someone to add them for you. What else can I do to help prevent falls? Wear shoes that: Do not have high heels. Have rubber bottoms. Are comfortable and fit you well. Are closed at the toe. Do not wear sandals. If you use a stepladder: Make sure that it is fully opened.  Do not climb a closed stepladder. Make sure that both sides of the stepladder are locked into place. Ask someone to hold it for you, if possible. Clearly mark and make sure that you can see: Any grab bars or handrails. First and last steps. Where the edge of each step is. Use tools that help you move around (mobility aids) if they are needed. These include: Canes. Walkers. Scooters. Crutches. Turn on the lights when you go into a dark area. Replace any light bulbs as soon as they burn out. Set up your furniture so you have a clear path. Avoid moving your furniture around. If any of your floors are uneven, fix them. If there are any pets around you, be aware of where they are. Review your medicines with your doctor. Some medicines can make you feel dizzy. This can increase your chance of falling. Ask your doctor what other things that you can do to help prevent falls. This information is not intended to replace advice given to you by your health care provider. Make sure you discuss any questions you have with your health care provider. Document Released: 03/02/2009 Document Revised: 10/12/2015 Document Reviewed: 06/10/2014 Elsevier Interactive Patient Education  2017 [employer].

## 2021-07-04 ENCOUNTER — Telehealth: Payer: Self-pay | Admitting: Family Medicine

## 2021-07-04 DIAGNOSIS — J4530 Mild persistent asthma, uncomplicated: Secondary | ICD-10-CM

## 2021-07-04 MED ORDER — SPIRIVA RESPIMAT 2.5 MCG/ACT IN AERS: INHALATION_SPRAY | RESPIRATORY_TRACT | 2 refills | Status: DC

## 2021-07-04 MED ORDER — FLUTICASONE FUROATE-VILANTEROL 200-25 MCG/ACT IN AEPB: INHALATION_SPRAY | RESPIRATORY_TRACT | 5 refills | Status: DC

## 2021-07-04 MED ORDER — ALBUTEROL SULFATE HFA 108 (90 BASE) MCG/ACT IN AERS: 2.0000 | INHALATION_SPRAY | RESPIRATORY_TRACT | 3 refills | Status: DC | PRN

## 2021-07-04 NOTE — Telephone Encounter (Signed)
Pt walked in to [facility] no appt. But stated she need refill on several medication.  Albuterol sulfate  Fluticasone furoate vilanterol  Tiotropium bromide monohydrate

## 2021-07-04 NOTE — Telephone Encounter (Signed)
Refills sent to pharmacy. 

## 2021-07-30 ENCOUNTER — Ambulatory Visit (INDEPENDENT_AMBULATORY_CARE_PROVIDER_SITE_OTHER): Payer: PPO | Admitting: Family Medicine

## 2021-07-30 ENCOUNTER — Encounter: Payer: Self-pay | Admitting: Family Medicine

## 2021-07-30 ENCOUNTER — Other Ambulatory Visit: Payer: Self-pay

## 2021-07-30 VITALS — BP 140/80 | HR 76 | Temp 97.8°F | Ht 63.0 in | Wt 162.4 lb

## 2021-07-30 DIAGNOSIS — Z8673 Personal history of transient ischemic attack (TIA), and cerebral infarction without residual deficits: Secondary | ICD-10-CM | POA: Diagnosis not present

## 2021-07-30 DIAGNOSIS — Z0000 Encounter for general adult medical examination without abnormal findings: Secondary | ICD-10-CM | POA: Diagnosis not present

## 2021-07-30 DIAGNOSIS — I10 Essential (primary) hypertension: Secondary | ICD-10-CM

## 2021-07-30 DIAGNOSIS — R7309 Other abnormal glucose: Secondary | ICD-10-CM

## 2021-07-30 DIAGNOSIS — H539 Unspecified visual disturbance: Secondary | ICD-10-CM | POA: Diagnosis not present

## 2021-07-30 DIAGNOSIS — Z85828 Personal history of other malignant neoplasm of skin: Secondary | ICD-10-CM | POA: Diagnosis not present

## 2021-07-30 DIAGNOSIS — J411 Mucopurulent chronic bronchitis: Secondary | ICD-10-CM

## 2021-07-30 DIAGNOSIS — F339 Major depressive disorder, recurrent, unspecified: Secondary | ICD-10-CM

## 2021-07-30 DIAGNOSIS — R7989 Other specified abnormal findings of blood chemistry: Secondary | ICD-10-CM

## 2021-07-30 NOTE — Progress Notes (Signed)
Annual Exam   Chief Complaint:  Chief Complaint  Patient presents with   Medicare Wellness   Referral    Eye dr and dermatology. Location doesn't matter    Medication Management    Needs alternative to her Breo  inhaler    History of Present Illness:  Nancy Merritt is a 81 y.o. No obstetric history on file. who LMP was No LMP recorded. Patient is postmenopausal., presents today for her annual examination.    Allergic to advair and symbicort  Nutrition She does get adequate calcium and Vitamin D in her diet. Diet: tries to eat healthy, with some treats Exercise: not currently  Safety The patient wears seatbelts: yes.     The patient feels safe at home and in their relationships: yes.     GYN She is not sexually active.     Social History   Tobacco Use  Smoking Status Former   Packs/day: 1.00   Years: 40.00   Pack years: 40.00   Types: Cigarettes   Quit date: 2002   Years since quitting: 21.2  Smokeless Tobacco Never    Lung Cancer Screening (Ages 56-80): yes 20 year pack history? Yes Current Tobacco user? No Quit less than 15 years ago? No Interested in low dose CT for lung cancer screening? not applicable  Weight Wt Readings from Last 3 Encounters:  07/30/21 162 lb 6 oz (73.7 kg)  06/25/21 163 lb (73.9 kg)  09/01/20 163 lb (73.9 kg)   Patient has normal BMI  BMI Readings from Last 1 Encounters:  07/30/21 28.76 kg/m     Chronic disease screening Blood pressure monitoring:  BP Readings from Last 3 Encounters:  07/30/21 140/80  09/01/20 126/76  08/08/20 108/70    Lipid Monitoring: Indication for screening: age >47, obesity, diabetes, family hx, CV risk factors.  Lipid screening: Yes  Lab Results  Component Value Date   CHOL 158 07/17/2020   HDL 53 07/17/2020   LDLCALC 87 07/17/2020   TRIG 99 07/17/2020   CHOLHDL 3.0 07/17/2020     Diabetes Screening: age >62, overweight, family hx, PCOS, hx of gestational diabetes, at risk  ethnicity Diabetes Screening screening: Yes  No results found for: HGBA1C   Past Medical History:  Diagnosis Date   Arthritis    Asthma    Cancer ([place]) 1990   COVID-19 06/14/2020   Depression     Past Surgical History:  Procedure Laterality Date   BREAST SURGERY Left 1990   CORONARY ARTERY BYPASS GRAFT     JOINT REPLACEMENT Right    Hip   OVARIAN CYST REMOVAL  [place]    Prior to Admission medications   Medication Sig Start Date End Date Taking? Authorizing Provider  albuterol (PROVENTIL) (2.5 MG/3ML) 0.083% nebulizer solution INHALE 1 VIAL VIA NEBULIZER 3 TIMES A DAY AS NEEDED 03/27/20  Yes Lesleigh Noe, MD  albuterol (VENTOLIN HFA) 108 (90 Base) MCG/ACT inhaler Inhale 2 puffs into the lungs every 4 (four) hours as needed for wheezing or shortness of breath. 07/04/21  Yes Lesleigh Noe, MD  aspirin 325 MG tablet Take 325 mg by mouth daily.   Yes [provider]  Calcium Carbonate-Vit D-Min (CALCIUM 1200 PO) Take 1,200 mg by mouth daily at 12 noon.   Yes [provider]  Fish Oil-Cholecalciferol (FISH OIL + D3 PO) Take 1 capsule by mouth daily.   Yes [provider]  FLAXSEED, LINSEED, PO Take by mouth.  Yes [provider]  fluticasone furoate-vilanterol (BREO ELLIPTA) 200-25 MCG/ACT AEPB INHALE 1 PUFF BY MOUTH EVERY DAY 07/04/21  Yes Lesleigh Noe, MD  ibuprofen (ADVIL,MOTRIN) 200 MG tablet Take 200 mg by mouth every 6 (six) hours as needed for fever or headache.   Yes [provider]  lisinopril (ZESTRIL) 20 MG tablet Take 1 tablet (20 mg total) by mouth daily. 06/21/20 07/30/21 Yes Donne Hazel, MD  loratadine (CLARITIN) 10 MG tablet Take 10 mg by mouth daily.   Yes [provider]  Multiple Vitamin (MULTIVITAMIN) tablet Take 1 tablet by mouth daily.   Yes [provider]  Tiotropium Bromide Monohydrate (SPIRIVA RESPIMAT) 2.5 MCG/ACT AERS INHALE 2 PUFFS INTO THE LUNGS EVERY DAY 07/04/21   Yes Lesleigh Noe, MD  Zinc 50 MG CAPS Take 50 mg by mouth daily.   Yes [provider]    Allergies  Allergen Reactions   Advair Diskus [Fluticasone-Salmeterol] Shortness Of Breath    As of 09/01/2014. Pt can take Breo   Levofloxacin Other (See Comments)    Nausea, brain fog, depression, as of 12/12/14   Qvar [Beclomethasone] Other (See Comments)    Lungs closing-as of 05/03/2015   Singulair [Montelukast] Shortness Of Breath   Symbicort [Budesonide-Formoterol Fumarate] Other (See Comments)    Hard time breathing as of 10/31/2014.    Gynecologic History: No LMP recorded. Patient is postmenopausal.  Obstetric History: No obstetric history on file.  Social History   Socioeconomic History   Marital status: Widowed    Spouse name: Not on file   Number of children: 3   Years of education: 2 associate degrees   Highest education level: Not on file  Occupational History   Not on file  Tobacco Use   Smoking status: Former    Packs/day: 1.00    Years: 40.00    Pack years: 40.00    Types: Cigarettes    Quit date: 2002    Years since quitting: 21.2   Smokeless tobacco: Never  Vaping Use   Vaping Use: Never used  Substance and Sexual Activity   Alcohol use: Not Currently   Drug use: Never   Sexual activity: Not Currently  Other Topics Concern   Not on file  Social History Narrative   Moved from [place] with Daughter, [name]  (one daughter in [place] and the other in [place]) - 5 granddaughters and 2 great-grandchildren   Enjoys needle work, reading, cook   Exercise: not really, can walk 1/2 a mile at slow pace w/o getting SOB, SOB with stairs   Sleeps on the ground floor   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)   Social Determinants of Radio broadcast assistant Strain: Low Risk    Difficulty of Paying Living Expenses: Not hard at all  Food Insecurity: No Food Insecurity   Worried About Charity fundraiser in the Last Year: Never true   Youth worker in the Last Year: Never true  Transportation Needs: No Transportation Needs   Lack of Transportation (Medical): No   Lack of Transportation (Non-Medical): No  Physical Activity: Inactive   Days of Exercise per Week: 0 days   Minutes of Exercise per Session: 0 min  Stress: No Stress Concern Present   Feeling of Stress : Not at all  Social Connections: Moderately Isolated   Frequency of Communication with Friends and Family: More than three times a week   Frequency of Social Gatherings with  Friends and Family: More than three times a week   Attends Religious Services: More than 4 times per year   Active Member of Clubs or Organizations: No   Attends Archivist Meetings: Never   Marital Status: Widowed  Human resources officer Violence: Not At Risk   Fear of Current or Ex-Partner: No   Emotionally Abused: No   Physically Abused: No   Sexually Abused: No    Family History  Problem Relation Age of Onset   Cancer Father        lung   Heart attack Father 69   Alcohol abuse Father    Arthritis Father    COPD Father    Diabetes Father    Hypertension Father    Breast cancer Sister 63   Arthritis Sister    Cancer Sister    COPD Sister    Drug abuse Sister    Hypertension Sister    25 / [place] Sister    Thyroid disease Sister    Arthritis Mother    COPD Mother    Diabetes Mother    Hearing loss Mother    Hypertension Mother    31 / [place] Mother    Stroke Mother    Alcohol abuse Brother    Arthritis Brother    Hypertension Brother    Arthritis Daughter    Asthma Daughter    Depression Daughter    Hypertension Daughter    85 / [place] Daughter    Thyroid disease Daughter    Arthritis Maternal Grandmother    Hypertension Maternal Grandmother    Stroke Maternal Grandmother    Thyroid disease Maternal Grandmother    Alcohol abuse Maternal Grandfather    Arthritis Maternal Grandfather    Cancer Maternal Grandfather     Depression Maternal Grandfather    Hearing loss Maternal Grandfather    Hypertension Maternal Grandfather    Arthritis Sister    Thyroid disease Sister    Thyroid disease Daughter    Hypertension Daughter    Depression Daughter    COPD Daughter    Alcohol abuse Daughter    Arthritis Daughter    Arthritis Daughter    COPD Daughter    Depression Daughter    Hearing loss Daughter    Hypertension Daughter    Thyroid disease Daughter     Review of Systems  Constitutional:  Negative for chills and fever.  HENT:  Negative for congestion and sore throat.   Eyes:  Negative for blurred vision and double vision.  Respiratory:  Negative for shortness of breath.   Cardiovascular:  Negative for chest pain.  Gastrointestinal:  Negative for heartburn, nausea and vomiting.  Genitourinary: Negative.   Musculoskeletal: Negative.  Negative for myalgias.  Skin:  Negative for rash.  Neurological:  Negative for dizziness and headaches.  Endo/Heme/Allergies:  Does not bruise/bleed easily.  Psychiatric/Behavioral:  Negative for depression. The patient is not nervous/anxious.     Physical Exam BP 140/80    Pulse 76    Temp 97.8 F (36.6 C) (Oral)    Ht '5\' 3"'$  (1.6 m)    Wt 162 lb 6 oz (73.7 kg)    SpO2 96%    BMI 28.76 kg/m    BP Readings from Last 3 Encounters:  07/30/21 140/80  09/01/20 126/76  08/08/20 108/70      Physical Exam Constitutional:      General: She is not in acute distress.    Appearance: She is well-developed. She is not diaphoretic.  HENT:     Head: Normocephalic and atraumatic.     Right Ear: External ear normal.     Left Ear: External ear normal.     Nose: Nose normal.  Eyes:     General: No scleral icterus.    Extraocular Movements: Extraocular movements intact.     Conjunctiva/sclera: Conjunctivae normal.  Cardiovascular:     Rate and Rhythm: Normal rate and regular rhythm.     Heart sounds: No murmur heard. Pulmonary:     Effort: Pulmonary effort is  normal. No respiratory distress.     Breath sounds: Wheezing (faint, scattered) present.  Abdominal:     General: Bowel sounds are normal. There is no distension.     Palpations: Abdomen is soft. There is no mass.     Tenderness: There is no abdominal tenderness. There is no guarding or rebound.  Musculoskeletal:        General: Normal range of motion.     Cervical back: Neck supple.  Lymphadenopathy:     Cervical: No cervical adenopathy.  Skin:    General: Skin is warm and dry.     Capillary Refill: Capillary refill takes less than 2 seconds.  Neurological:     Mental Status: She is alert and oriented to person, place, and time.     Deep Tendon Reflexes: Reflexes normal.  Psychiatric:        Mood and Affect: Mood normal.        Behavior: Behavior normal.    Results:  PHQ-9:  Flowsheet Row Clinical Support from 11/18/2019 in [place] at [facility])  PHQ-9 Total Score 0         Assessment: 81 y.o. No obstetric history on file. female here for routine annual physical examination.  Plan: Problem List Items Addressed This Visit       Cardiovascular and Mediastinum   Hypertension   Relevant Orders   Comprehensive metabolic panel     Respiratory   COPD (chronic obstructive pulmonary disease) ([place])     Other   Depression, recurrent (HCC)   History of CVA (cerebrovascular accident) without residual deficits   Relevant Orders   Lipid panel   Other Visit Diagnoses     Annual physical exam    -  Primary   History of skin cancer       Relevant Orders   Ambulatory referral to Dermatology   Vision changes       Relevant Orders   Ambulatory referral to Ophthalmology   Elevated glucose       Relevant Orders   Hemoglobin A1c   Elevated ferritin       Relevant Orders   Ferritin   CBC       Screening: -- Blood pressure screen normal -- cholesterol screening: will obtain -- Weight screening: overweight: continue to monitor -- Diabetes Screening: will  obtain -- Nutrition: Encouraged healthy diet  The ASCVD Risk score (Arnett DK, et al., 2019) failed to calculate for the following reasons:   The 2019 ASCVD risk score is only valid for ages 95 to 46   The patient has a prior MI or stroke diagnosis  -- Statin therapy for Age 90-75 with CVD risk >7.5%  Psych -- Depression screening (PHQ-9):  Flowsheet Row Clinical Support from 11/18/2019 in [place] at [facility]  PHQ-9 Total Score 0        Safety -- tobacco screening: not using -- alcohol screening:  low-risk usage. -- no evidence of domestic  violence or intimate partner violence.   Cancer Screening -- has aged out of screening  Immunizations Immunization History  Administered Date(s) Administered   Influenza, High Dose Seasonal PF 04/07/2018   Influenza,inj,Quad PF,6+ Mos 05/19/2019   Pneumococcal Conjugate-13 11/09/2018   Pneumococcal Polysaccharide-23 08/15/2015   Tdap 08/15/2015   Zoster, Live 08/15/2015     -- TDAP q10 years up to date -- Shingles (age >50) not up to date, encouraged getting in pharmacy -- PPSV-23 (19-64 with chronic disease or smoking) up to date -- PCV-13 (age >64) - one dose followed by PPSV-23 1 year later up to date -- Covid-19 Vaccine- declined   Encouraged healthy diet and exercise. Encouraged regular vision and dental care.    Lesleigh Noe, MD

## 2021-07-30 NOTE — Patient Instructions (Addendum)
#  Referral ?I have placed a referral to a specialist for you. You should receive a phone call from the specialty office. Make sure your voicemail is not full and that if you are able to answer your phone to unknown or new numbers.  ? ?It may take up to 2 weeks to hear about the referral. If you do not hear anything in 2 weeks, please call our office and ask to speak with the referral coordinator.  ? ? ?We will send in a prior authorization for the Breo ? ?Shingles Vaccine at the pharmacy ?

## 2021-07-31 ENCOUNTER — Other Ambulatory Visit: Payer: Self-pay | Admitting: Family Medicine

## 2021-07-31 ENCOUNTER — Telehealth: Payer: Self-pay | Admitting: *Deleted

## 2021-07-31 ENCOUNTER — Other Ambulatory Visit: Payer: Self-pay | Admitting: Oncology

## 2021-07-31 DIAGNOSIS — D75839 Thrombocytosis, unspecified: Secondary | ICD-10-CM

## 2021-07-31 DIAGNOSIS — E875 Hyperkalemia: Secondary | ICD-10-CM

## 2021-07-31 LAB — CBC
HCT: 41.9 % (ref 36.0–46.0)
Hemoglobin: 14.1 g/dL (ref 12.0–15.0)
MCHC: 33.7 g/dL (ref 30.0–36.0)
MCV: 89.7 fl (ref 78.0–100.0)
Platelets: 1433 10*3/uL (ref 150.0–400.0)
RBC: 4.68 Mil/uL (ref 3.87–5.11)
RDW: 15.5 % (ref 11.5–15.5)
WBC: 8.8 10*3/uL (ref 4.0–10.5)

## 2021-07-31 LAB — COMPREHENSIVE METABOLIC PANEL
ALT: 16 U/L (ref 0–35)
AST: 19 U/L (ref 0–37)
Albumin: 4.1 g/dL (ref 3.5–5.2)
Alkaline Phosphatase: 88 U/L (ref 39–117)
BUN: 26 mg/dL — ABNORMAL HIGH (ref 6–23)
CO2: 27 mEq/L (ref 19–32)
Calcium: 11.8 mg/dL — ABNORMAL HIGH (ref 8.4–10.5)
Chloride: 102 mEq/L (ref 96–112)
Creatinine, Ser: 0.81 mg/dL (ref 0.40–1.20)
GFR: 67.57 mL/min (ref >60.00)
Glucose, Bld: 85 mg/dL (ref 70–99)
Potassium: 6 mEq/L — ABNORMAL HIGH (ref 3.5–5.1)
Sodium: 136 mEq/L (ref 135–145)
Total Bilirubin: 0.4 mg/dL (ref 0.2–1.2)
Total Protein: 6.2 g/dL (ref 6.0–8.3)

## 2021-07-31 LAB — LIPID PANEL
Cholesterol: 159 mg/dL (ref 0–200)
HDL: 44.7 mg/dL (ref >39.00)
LDL Cholesterol: 97 mg/dL (ref 0–99)
NonHDL: 114.42
Total CHOL/HDL Ratio: 4
Triglycerides: 85 mg/dL (ref 0.0–149.0)
VLDL: 17 mg/dL (ref 0.0–40.0)

## 2021-07-31 LAB — FERRITIN: Ferritin: 117.2 ng/mL (ref 10.0–291.0)

## 2021-07-31 LAB — HEMOGLOBIN A1C: Hgb A1c MFr Bld: 6.3 % (ref 4.6–6.5)

## 2021-07-31 NOTE — Telephone Encounter (Signed)
[facility] called with Critical lab results. ? ?Pt's Platelet count is 1433 ? ? ?Results given to PCP ?

## 2021-07-31 NOTE — Telephone Encounter (Signed)
Per Dr. Einar Pheasant: "Called to relay lab results to patient. Her blood pressure was 130/80.  She will hold lisinopril for 2 days and repeat labs on Thursday.  Signal was difficult please call patient to remind her to update if her blood pressure is greater than 150/90.  Schedule lab appointment." ?

## 2021-07-31 NOTE — Telephone Encounter (Signed)
Called to discuss the platelets - continue aspirin. Hematology will arrange f/u for patient.  ? ?Potassium also high, patient will check blood pressure today she has not taken her lisinopril.  We will call back to see what her blood pressure is and determine whether or not she needs hydrochlorothiazide in place of lisinopril for period of time. ?

## 2021-08-02 ENCOUNTER — Other Ambulatory Visit (INDEPENDENT_AMBULATORY_CARE_PROVIDER_SITE_OTHER): Payer: PPO

## 2021-08-02 ENCOUNTER — Telehealth: Payer: Self-pay | Admitting: Oncology

## 2021-08-02 ENCOUNTER — Other Ambulatory Visit: Payer: Self-pay

## 2021-08-02 DIAGNOSIS — E875 Hyperkalemia: Secondary | ICD-10-CM | POA: Diagnosis not present

## 2021-08-02 DIAGNOSIS — D75839 Thrombocytosis, unspecified: Secondary | ICD-10-CM

## 2021-08-02 NOTE — Telephone Encounter (Signed)
Blood pressure is controlled.  ? ?Please have patient continue to hold blood pressure medication. Have her update with home readings next week ?

## 2021-08-02 NOTE — Telephone Encounter (Signed)
Pt came in for her labs and asked for me to check her BP, since holding her lisinopril. BP 130/80. Pt states she will take her lisinopril tomorrow morning.  ?

## 2021-08-02 NOTE — Telephone Encounter (Signed)
Pt did not answer and does not check her VM. I spoke to pt's dtr, [name] [facility]) and relayed the message for pt to continue holding the lisinopril and check BP for a week, then update [place].  ? ?

## 2021-08-02 NOTE — Telephone Encounter (Signed)
.  Called patient to schedule appointment per 3/14 inbasket, patient is aware of date and time.   ?

## 2021-08-03 ENCOUNTER — Telehealth: Payer: Self-pay

## 2021-08-03 LAB — POTASSIUM: Potassium: 5.1 mEq/L (ref 3.5–5.1)

## 2021-08-03 LAB — CBC
HCT: 40.9 % (ref 36.0–46.0)
Hemoglobin: 13.5 g/dL (ref 12.0–15.0)
MCHC: 33 g/dL (ref 30.0–36.0)
MCV: 89.7 fl (ref 78.0–100.0)
Platelets: 1459 10*3/uL (ref 150.0–400.0)
RBC: 4.56 Mil/uL (ref 3.87–5.11)
RDW: 15.4 % (ref 11.5–15.5)
WBC: 9.3 10*3/uL (ref 4.0–10.5)

## 2021-08-03 NOTE — Telephone Encounter (Signed)
Critical Platelet Lab Result of 1459. Nancy Merritt, CMA aware. Note given to Dr Einar Pheasant at 1150 am. ?

## 2021-08-06 ENCOUNTER — Other Ambulatory Visit: Payer: Self-pay | Admitting: *Deleted

## 2021-08-06 ENCOUNTER — Inpatient Hospital Stay: Payer: PPO | Attending: Oncology

## 2021-08-06 ENCOUNTER — Inpatient Hospital Stay: Payer: PPO | Admitting: Oncology

## 2021-08-06 ENCOUNTER — Other Ambulatory Visit: Payer: Self-pay

## 2021-08-06 DIAGNOSIS — D75839 Thrombocytosis, unspecified: Secondary | ICD-10-CM | POA: Insufficient documentation

## 2021-08-06 DIAGNOSIS — Z79899 Other long term (current) drug therapy: Secondary | ICD-10-CM | POA: Diagnosis not present

## 2021-08-06 DIAGNOSIS — R7401 Elevation of levels of liver transaminase levels: Secondary | ICD-10-CM | POA: Insufficient documentation

## 2021-08-06 LAB — CBC WITH DIFFERENTIAL (CANCER CENTER ONLY)
Abs Immature Granulocytes: 0.02 10*3/uL (ref 0.00–0.07)
Basophils Absolute: 0.1 10*3/uL (ref 0.0–0.1)
Basophils Relative: 1 %
Eosinophils Absolute: 0.3 10*3/uL (ref 0.0–0.5)
Eosinophils Relative: 4 %
HCT: 40.1 % (ref 36.0–46.0)
Hemoglobin: 13.3 g/dL (ref 12.0–15.0)
Immature Granulocytes: 0 %
Lymphocytes Relative: 27 %
Lymphs Abs: 2.2 10*3/uL (ref 0.7–4.0)
MCH: 29.9 pg (ref 26.0–34.0)
MCHC: 33.2 g/dL (ref 30.0–36.0)
MCV: 90.1 fL (ref 80.0–100.0)
Monocytes Absolute: 0.6 10*3/uL (ref 0.1–1.0)
Monocytes Relative: 7 %
Neutro Abs: 5 10*3/uL (ref 1.7–7.7)
Neutrophils Relative %: 61 %
Platelet Count: 1342 10*3/uL (ref 150–400)
RBC: 4.45 MIL/uL (ref 3.87–5.11)
RDW: 14.8 % (ref 11.5–15.5)
WBC Count: 8.2 10*3/uL (ref 4.0–10.5)
nRBC: 0 % (ref 0.0–0.2)

## 2021-08-06 LAB — FERRITIN: Ferritin: 136 ng/mL (ref 11–307)

## 2021-08-06 LAB — IRON AND IRON BINDING CAPACITY (CC-WL,HP ONLY)
Iron: 126 ug/dL (ref 28–170)
Saturation Ratios: 42 % — ABNORMAL HIGH (ref 10.4–31.8)
TIBC: 298 ug/dL (ref 250–450)
UIBC: 172 ug/dL (ref 148–442)

## 2021-08-06 MED ORDER — HYDROXYUREA 500 MG PO CAPS: 500.0000 mg | ORAL_CAPSULE | Freq: Two times a day (BID) | ORAL | 3 refills | Status: DC

## 2021-08-06 NOTE — Telephone Encounter (Signed)
Noted, patient already has hematology f/u ?

## 2021-08-06 NOTE — Progress Notes (Signed)
Hematology and Oncology Follow Up Visit ? ?Nancy Merritt ?[id number] ?09/17/1940 81 y.o. ?08/06/2021 11:29 AM ?Nancy Merritt, MDCody, [name], MD  ? ?Principle Diagnosis: 81 year old woman with thrombocytosis noted in 2020.  Differential diagnosis includes essential thrombocythemia versus reactive findings. ? ?Secondary diagnosis: Elevated transaminases and ferritin diagnosed in 2021. ? ?Current therapy: Under evaluation to start therapy. ? ?Interim History: [name] presents today for repeat evaluation.  Since the last visit, she continues to have elevation in her platelet count that has gradually increased in the last 3 years.  CBC obtained on July 30, 2021 showed a platelet count of 1433.  This was repeated and confirmed the presence of elevated platelet count at 1459.  She has normal white cell count and normal platelet count.  Previous platelet count showed elevation dating back to 2020 with platelet count of range between 500 to 800. ? ?Clinically, she is asymptomatic at this time.  She denies any thrombosis or bleeding episodes.  She denies any headaches, chest pain or shortness of breath.  He denies any excessive fatigue or tiredness. ? ? ? ? ? ? ?Medications: Updated on review. ?Current Outpatient Medications  ?Medication Sig Dispense Refill  ? albuterol (PROVENTIL) (2.5 MG/3ML) 0.083% nebulizer solution INHALE 1 VIAL VIA NEBULIZER 3 TIMES A DAY AS NEEDED 75 mL 2  ? albuterol (VENTOLIN HFA) 108 (90 Base) MCG/ACT inhaler Inhale 2 puffs into the lungs every 4 (four) hours as needed for wheezing or shortness of breath. 6.7 g 3  ? aspirin 325 MG tablet Take 325 mg by mouth daily.    ? Calcium Carbonate-Vit D-Min (CALCIUM 1200 PO) Take 1,200 mg by mouth daily at 12 noon.    ? Fish Oil-Cholecalciferol (FISH OIL + D3 PO) Take 1 capsule by mouth daily.    ? FLAXSEED, LINSEED, PO Take by mouth.    ? fluticasone furoate-vilanterol (BREO ELLIPTA) 200-25 MCG/ACT AEPB INHALE 1 PUFF BY MOUTH EVERY DAY 60 each 5   ? ibuprofen (ADVIL,MOTRIN) 200 MG tablet Take 200 mg by mouth every 6 (six) hours as needed for fever or headache.    ? lisinopril (ZESTRIL) 20 MG tablet Take 1 tablet (20 mg total) by mouth daily. 30 tablet 0  ? loratadine (CLARITIN) 10 MG tablet Take 10 mg by mouth daily.    ? Multiple Vitamin (MULTIVITAMIN) tablet Take 1 tablet by mouth daily.    ? Tiotropium Bromide Monohydrate (SPIRIVA RESPIMAT) 2.5 MCG/ACT AERS INHALE 2 PUFFS INTO THE LUNGS EVERY DAY 12 g 2  ? Zinc 50 MG CAPS Take 50 mg by mouth daily.    ? ?No current facility-administered medications for this visit.  ? ? ? ?Allergies:  ?Allergies  ?Allergen Reactions  ? Advair Diskus [Fluticasone-Salmeterol] Shortness Of Breath  ?  As of 09/01/2014. Pt can take Breo  ? Levofloxacin Other (See Comments)  ?  Nausea, brain fog, depression, as of 12/12/14  ? Qvar [Beclomethasone] Other (See Comments)  ?  Lungs closing-as of 05/03/2015  ? Singulair [Montelukast] Shortness Of Breath  ? Symbicort [Budesonide-Formoterol Fumarate] Other (See Comments)  ?  Hard time breathing as of 10/31/2014.  ? ? ? ?Physical Exam: ?Blood pressure (!) 155/67, pulse 75, temperature (!) 97.5 ?F (36.4 ?C), temperature source Temporal, resp. rate 16, height '5\' 3"'$  (1.6 m), weight 163 lb 2 oz (74 kg), SpO2 97 %. ? ?ECOG: 0 ? ? ? ?General appearance: Alert, awake without any distress. ?Head: Atraumatic without abnormalities ?Oropharynx: Without any thrush or ulcers. ?Eyes: No  scleral icterus. ?Lymph nodes: No lymphadenopathy noted in the cervical, supraclavicular, or axillary nodes ?Heart:regular rate and rhythm, without any murmurs or gallops.   ?Lung: Clear to auscultation without any rhonchi, wheezes or dullness to percussion. ?Abdomin: Soft, nontender without any shifting dullness or ascites. ?Musculoskeletal: No clubbing or cyanosis. ?Neurological: No motor or sensory deficits. ?Skin: No rashes or lesions. ? ? ? ? ?Lab Results: ?Lab Results  ?Component Value Date  ? WBC 8.2 08/06/2021  ?  HGB 13.3 08/06/2021  ? HCT 40.1 08/06/2021  ? MCV 90.1 08/06/2021  ? PLT 1,342 (HH) 08/06/2021  ? ?  Chemistry   ?   ?Component Value Date/Time  ? NA 136 07/30/2021 1524  ? NA 140 01/06/2017 0000  ? K 5.1 08/02/2021 1513  ? CL 102 07/30/2021 1524  ? CO2 27 07/30/2021 1524  ? BUN 26 (H) 07/30/2021 1524  ? BUN 15 01/06/2017 0000  ? CREATININE 0.81 07/30/2021 1524  ? CREATININE 0.84 01/07/2020 1024  ? GLU 119 01/06/2017 0000  ?    ?Component Value Date/Time  ? CALCIUM 11.8 (H) 07/30/2021 1524  ? CALCIUM 10.8 03/24/2017 0000  ? ALKPHOS 88 07/30/2021 1524  ? AST 19 07/30/2021 1524  ? AST 37 01/07/2020 1024  ? ALT 16 07/30/2021 1524  ? ALT 74 (H) 01/07/2020 1024  ? BILITOT 0.4 07/30/2021 1524  ? BILITOT 0.7 01/07/2020 1024  ?  ? ? ? ? ? ?Impression and Plan: ? ? ?81 year old woman with: ? ? ?1.  Thrombocytosis: This was noted dating back to 2020 and has been slowly rising with platelet count over 1000 detected in March 2023. ? ? ?The differential diagnosis of these findings were discussed at this time.  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  I will obtain work-up today including a molecular panel for JAK2 mutation as well as other associated mutations to evaluate for him. ? ?From a management standpoint, I have recommended starting hydroxyurea immediately to start cytoreduction given the risk of thrombosis and bleeding that is associated with such high counts.  Complication associated with this medication include nausea, fatigue among others were reiterated.  We will start at 500 mg twice a day and adjust the dosing accordingly.  She is agreeable to proceed. ? ? ? ?  ?2. Elevated transaminases: Appears to have resolved at this time with liver function test obtained on July 30, 2021 was within normal range. ?  ?  ?3. Follow-up: We will be in the next 4 to 6 weeks to follow her progress. ?  ?30  minutes were dedicated to this visit.  The time was spent on  reviewing laboratory data, disease status update, reviewing differential diagnosis and management choices. ? ?  ? ? ?Zola Button, MD ?3/20/202311:29 AM ? ?

## 2021-08-06 NOTE — Telephone Encounter (Signed)
Pt returning call regarding lab results . Would like a call back [phone]  ?

## 2021-08-06 NOTE — Progress Notes (Signed)
CRITICAL VALUE STICKER ? ?CRITICAL VALUE: PLT 1342 x10 ? ?RECEIVER (on-site recipient of call): ? ?DATE & TIME NOTIFIED: 1145 ? ?MESSENGER (representative from lab): Pam  ? ?MD NOTIFIED: Dr.Shadad ? ?TIME OF NOTIFICATION: 1102 ? ?RESPONSE:  Noted  ?

## 2021-08-08 NOTE — Telephone Encounter (Signed)
Spoke to pt and reviewed lab results with her.  ?

## 2021-08-09 ENCOUNTER — Other Ambulatory Visit: Payer: Self-pay | Admitting: Family Medicine

## 2021-08-09 DIAGNOSIS — I10 Essential (primary) hypertension: Secondary | ICD-10-CM

## 2021-08-13 LAB — JAK2 (INCLUDING V617F AND EXON 12), MPL,& CALR-NEXT GEN SEQ

## 2021-08-14 ENCOUNTER — Telehealth: Payer: Self-pay | Admitting: Oncology

## 2021-08-14 NOTE — Telephone Encounter (Signed)
Scheduled per 03/20 los, patient has been called and notified. ?

## 2021-08-21 DIAGNOSIS — I2510 Atherosclerotic heart disease of native coronary artery without angina pectoris: Secondary | ICD-10-CM | POA: Diagnosis not present

## 2021-08-21 DIAGNOSIS — J449 Chronic obstructive pulmonary disease, unspecified: Secondary | ICD-10-CM | POA: Diagnosis not present

## 2021-08-21 DIAGNOSIS — Z7951 Long term (current) use of inhaled steroids: Secondary | ICD-10-CM | POA: Diagnosis not present

## 2021-08-21 DIAGNOSIS — Z951 Presence of aortocoronary bypass graft: Secondary | ICD-10-CM | POA: Diagnosis not present

## 2021-08-21 DIAGNOSIS — I10 Essential (primary) hypertension: Secondary | ICD-10-CM | POA: Diagnosis not present

## 2021-08-21 DIAGNOSIS — D692 Other nonthrombocytopenic purpura: Secondary | ICD-10-CM | POA: Diagnosis not present

## 2021-08-21 DIAGNOSIS — Z7982 Long term (current) use of aspirin: Secondary | ICD-10-CM | POA: Diagnosis not present

## 2021-08-21 DIAGNOSIS — F3342 Major depressive disorder, recurrent, in full remission: Secondary | ICD-10-CM | POA: Diagnosis not present

## 2021-09-13 ENCOUNTER — Inpatient Hospital Stay: Payer: PPO | Admitting: Oncology

## 2021-09-13 ENCOUNTER — Inpatient Hospital Stay: Payer: PPO | Attending: Oncology

## 2021-09-13 ENCOUNTER — Other Ambulatory Visit: Payer: Self-pay

## 2021-09-13 DIAGNOSIS — Z79899 Other long term (current) drug therapy: Secondary | ICD-10-CM | POA: Insufficient documentation

## 2021-09-13 DIAGNOSIS — D473 Essential (hemorrhagic) thrombocythemia: Secondary | ICD-10-CM | POA: Insufficient documentation

## 2021-09-13 DIAGNOSIS — D75839 Thrombocytosis, unspecified: Secondary | ICD-10-CM

## 2021-09-13 LAB — CBC WITH DIFFERENTIAL (CANCER CENTER ONLY)
Abs Immature Granulocytes: 0.03 10*3/uL (ref 0.00–0.07)
Basophils Absolute: 0.1 10*3/uL (ref 0.0–0.1)
Basophils Relative: 1 %
Eosinophils Absolute: 0.2 10*3/uL (ref 0.0–0.5)
Eosinophils Relative: 2 %
HCT: 39.1 % (ref 36.0–46.0)
Hemoglobin: 12.8 g/dL (ref 12.0–15.0)
Immature Granulocytes: 0 %
Lymphocytes Relative: 24 %
Lymphs Abs: 2.2 10*3/uL (ref 0.7–4.0)
MCH: 30.9 pg (ref 26.0–34.0)
MCHC: 32.7 g/dL (ref 30.0–36.0)
MCV: 94.4 fL (ref 80.0–100.0)
Monocytes Absolute: 0.5 10*3/uL (ref 0.1–1.0)
Monocytes Relative: 5 %
Neutro Abs: 6.3 10*3/uL (ref 1.7–7.7)
Neutrophils Relative %: 68 %
Platelet Count: 1092 10*3/uL (ref 150–400)
RBC: 4.14 MIL/uL (ref 3.87–5.11)
RDW: 18.8 % — ABNORMAL HIGH (ref 11.5–15.5)
WBC Count: 9.3 10*3/uL (ref 4.0–10.5)
nRBC: 0 % (ref 0.0–0.2)

## 2021-09-13 NOTE — Progress Notes (Signed)
Hematology and Oncology Follow Up Visit ? ?Nancy Merritt ?[id number] ?Jan 26, 1941 81 y.o. ?09/13/2021 12:40 PM ?Lesleigh Noe, MDCody, [name], MD  ? 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  She presented with thrombocytosis dating back to 2020.  She has CALR mutation. ? ?Secondary diagnosis: Elevated transaminases and ferritin diagnosed in 2021. ? ?Current therapy: Hydroxyurea 1000 mg total dose daily. ? ?Interim History: Ms. Gorden Merritt returns today for a follow-up visit.  Since last visit, she started hydroxyurea and has reported few complaints related to it.  She has reported increased fatigue and occasional dyspnea and exacerbation of her reactive airway disease.  She denies any bleeding complications.  She denies any easy bruising or thrombosis episodes. ? ? ? ? ? ? ?Medications: Reviewed without changes. ?Current Outpatient Medications  ?Medication Sig Dispense Refill  ? albuterol (PROVENTIL) (2.5 MG/3ML) 0.083% nebulizer solution INHALE 1 VIAL VIA NEBULIZER 3 TIMES A DAY AS NEEDED 75 mL 2  ? albuterol (VENTOLIN HFA) 108 (90 Base) MCG/ACT inhaler Inhale 2 puffs into the lungs every 4 (four) hours as needed for wheezing or shortness of breath. 6.7 g 3  ? aspirin 325 MG tablet Take 325 mg by mouth daily.    ? Calcium Carbonate-Vit D-Min (CALCIUM 1200 PO) Take 1,200 mg by mouth daily at 12 noon.    ? Fish Oil-Cholecalciferol (FISH OIL + D3 PO) Take 1 capsule by mouth daily.    ? FLAXSEED, LINSEED, PO Take by mouth.    ? fluticasone furoate-vilanterol (BREO ELLIPTA) 200-25 MCG/ACT AEPB INHALE 1 PUFF BY MOUTH EVERY DAY 60 each 5  ? hydroxyurea (HYDREA) 500 MG capsule Take 1 capsule (500 mg total) by mouth 2 (two) times daily. May take with food to minimize GI side effects. 60 capsule 3  ? ibuprofen (ADVIL,MOTRIN) 200 MG tablet Take 200 mg by mouth every 6 (six) hours as needed for fever or headache.    ? lisinopril (ZESTRIL) 20 MG tablet TAKE 1 TABLET BY  MOUTH IN THE MORNING AND AT BEDTIME. 180 tablet 1  ? loratadine (CLARITIN) 10 MG tablet Take 10 mg by mouth daily.    ? Multiple Vitamin (MULTIVITAMIN) tablet Take 1 tablet by mouth daily.    ? Tiotropium Bromide Monohydrate (SPIRIVA RESPIMAT) 2.5 MCG/ACT AERS INHALE 2 PUFFS INTO THE LUNGS EVERY DAY 12 g 2  ? Zinc 50 MG CAPS Take 50 mg by mouth daily.    ? ?No current facility-administered medications for this visit.  ? ? ? ?Allergies:  ?Allergies  ?Allergen Reactions  ? Advair Diskus [Fluticasone-Salmeterol] Shortness Of Breath  ?  As of 09/01/2014. Pt can take Breo  ? Levofloxacin Other (See Comments)  ?  Nausea, brain fog, depression, as of 12/12/14  ? Qvar [Beclomethasone] Other (See Comments)  ?  Lungs closing-as of 05/03/2015  ? Singulair [Montelukast] Shortness Of Breath  ? Symbicort [Budesonide-Formoterol Fumarate] Other (See Comments)  ?  Hard time breathing as of 10/31/2014.  ? ? ? ?Physical Exam: ?Blood pressure 140/65, pulse 74, temperature 97.7 ?F (36.5 ?C), temperature source Temporal, resp. rate 16, height '5\' 3"'$  (1.6 m), weight 159 lb 9.6 oz (72.4 kg), SpO2 96 %. ? ?ECOG: 0 ? ? ? ?General appearance: Comfortable appearing without any discomfort ?Head: Normocephalic without any trauma ?Oropharynx: Mucous membranes are moist and pink without any thrush or ulcers. ?Eyes: Pupils are equal and round reactive to light. ?Lymph nodes: No cervical, supraclavicular, inguinal or axillary lymphadenopathy.   ?Heart:regular rate and rhythm.  S1 and S2 without leg edema. ?Lung: Clear without any rhonchi or wheezes.  No dullness to percussion. ?Abdomin: Soft, nontender, nondistended with good bowel sounds.  No hepatosplenomegaly. ?Musculoskeletal: No joint deformity or effusion.  Full range of motion noted. ?Neurological: No deficits noted on motor, sensory and deep tendon reflex exam. ?Skin: No petechial rash or dryness.  Appeared moist.  ? ? ? ? ? ? ?Lab Results: ?Lab Results  ?Component Value Date  ? WBC 9.3  09/13/2021  ? HGB 12.8 09/13/2021  ? HCT 39.1 09/13/2021  ? MCV 94.4 09/13/2021  ? PLT 1,092 ([place]) 09/13/2021  ? ?  Chemistry   ?   ?Component Value Date/Time  ? NA 136 07/30/2021 1524  ? NA 140 01/06/2017 0000  ? K 5.1 08/02/2021 1513  ? CL 102 07/30/2021 1524  ? CO2 27 07/30/2021 1524  ? BUN 26 (H) 07/30/2021 1524  ? BUN 15 01/06/2017 0000  ? CREATININE 0.81 07/30/2021 1524  ? CREATININE 0.84 01/07/2020 1024  ? GLU 119 01/06/2017 0000  ?    ?Component Value Date/Time  ? CALCIUM 11.8 (H) 07/30/2021 1524  ? CALCIUM 10.8 03/24/2017 0000  ? ALKPHOS 88 07/30/2021 1524  ? AST 19 07/30/2021 1524  ? AST 37 01/07/2020 1024  ? ALT 16 07/30/2021 1524  ? ALT 74 (H) 01/07/2020 1024  ? BILITOT 0.4 07/30/2021 1524  ? BILITOT 0.7 01/07/2020 1024  ?  ? ? ? ? ? ?Impression and Plan: ? ? ?81 year old woman with: ? ? ?1.  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  She presented with platelet count of 1300 and a positive CALR mutation.  ? ? ?She is currently on hydroxyurea total of 1000 mg daily with few complaints.  Laboratory data did show improvement in her platelet count with a drop of close to 25%.  Risks and benefits of continuing this treatment were reviewed at this time.  She is willing to continue treatment but we have adjusted to take the 1000 mg total dose at nighttime to combat some of the very side effects.  Alternative options including reducing the dose or switching to anagrelide if she has more difficulties.  The plan is to recheck her platelet count in 1 month and determine best course of action. ? ? ? ?  ?2. Elevated transaminases: Resolved at this time. ?  ?  ?3. Follow-up: In 4 weeks for repeat follow-up. ?  ?30  minutes were spent on this encounter.  The time was dedicated to reviewing laboratory data, disease status update and outlining future plan of care discussion. ?  ? ? ?Zola Button, MD ?4/27/202312:40 PM ? ?

## 2021-09-18 ENCOUNTER — Telehealth: Payer: Self-pay | Admitting: Oncology

## 2021-09-18 NOTE — Telephone Encounter (Signed)
Scheduled per 04/27 los, patient has been called and voicemail was not set up. Calender will be mailed.  ?

## 2021-10-11 ENCOUNTER — Other Ambulatory Visit: Payer: Self-pay

## 2021-10-11 ENCOUNTER — Inpatient Hospital Stay: Payer: PPO | Admitting: Oncology

## 2021-10-11 ENCOUNTER — Inpatient Hospital Stay: Payer: PPO | Attending: Oncology

## 2021-10-11 DIAGNOSIS — D473 Essential (hemorrhagic) thrombocythemia: Secondary | ICD-10-CM | POA: Insufficient documentation

## 2021-10-11 DIAGNOSIS — D75839 Thrombocytosis, unspecified: Secondary | ICD-10-CM | POA: Diagnosis not present

## 2021-10-11 DIAGNOSIS — Z79899 Other long term (current) drug therapy: Secondary | ICD-10-CM | POA: Insufficient documentation

## 2021-10-11 LAB — CBC WITH DIFFERENTIAL (CANCER CENTER ONLY)
Abs Immature Granulocytes: 0.02 10*3/uL (ref 0.00–0.07)
Basophils Absolute: 0 10*3/uL (ref 0.0–0.1)
Basophils Relative: 0 %
Eosinophils Absolute: 0.3 10*3/uL (ref 0.0–0.5)
Eosinophils Relative: 3 %
HCT: 38.7 % (ref 36.0–46.0)
Hemoglobin: 12.9 g/dL (ref 12.0–15.0)
Immature Granulocytes: 0 %
Lymphocytes Relative: 27 %
Lymphs Abs: 2.6 10*3/uL (ref 0.7–4.0)
MCH: 32.2 pg (ref 26.0–34.0)
MCHC: 33.3 g/dL (ref 30.0–36.0)
MCV: 96.5 fL (ref 80.0–100.0)
Monocytes Absolute: 0.6 10*3/uL (ref 0.1–1.0)
Monocytes Relative: 6 %
Neutro Abs: 6.2 10*3/uL (ref 1.7–7.7)
Neutrophils Relative %: 64 %
Platelet Count: 982 10*3/uL (ref 150–400)
RBC: 4.01 MIL/uL (ref 3.87–5.11)
RDW: 18.4 % — ABNORMAL HIGH (ref 11.5–15.5)
WBC Count: 9.7 10*3/uL (ref 4.0–10.5)
nRBC: 0 % (ref 0.0–0.2)

## 2021-10-11 NOTE — Progress Notes (Signed)
CRITICAL VALUE STICKER  CRITICAL VALUE: Platelets are 982  RECEIVER (on-site recipient of call): Bertram Millard LPN  [place] NOTIFIED: 10/11/2021, 1516  MESSENGER (representative from lab): Anda Latina  MD NOTIFIED: Dr.Shadad  TIME OF NOTIFICATION: 1523  RESPONSE: Dr. Alen Blew was made aware. A plan will be made aware to the desk Nurse if it is needed.

## 2021-10-11 NOTE — Progress Notes (Signed)
Hematology and Oncology Follow Up Visit  Nancy Merritt [id number] 1940-06-27 81 y.o. 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   Secondary diagnosis: Elevated transaminases and ferritin diagnosed in 2021.  Current therapy: Hydroxyurea 1000 mg total dose daily started in March 2020.  Interim History: [name] is here for repeat evaluation.  Since the last visit, she continues to take hydroxyurea without any major complications.  She does report some occasional fatigue and sleepiness associated with it.  He does take it at nighttime.  She denies any bleeding or thrombosis episodes.  She continues to be active and attends to activities of daily living.       Medications: Reviewed without changes. Current Outpatient Medications  Medication Sig Dispense Refill   albuterol (PROVENTIL) (2.5 MG/3ML) 0.083% nebulizer solution INHALE 1 VIAL VIA NEBULIZER 3 TIMES A DAY AS NEEDED 75 mL 2   albuterol (VENTOLIN HFA) 108 (90 Base) MCG/ACT inhaler Inhale 2 puffs into the lungs every 4 (four) hours as needed for wheezing or shortness of breath. 6.7 g 3   aspirin 325 MG tablet Take 325 mg by mouth daily.     Calcium Carbonate-Vit D-Min (CALCIUM 1200 PO) Take 1,200 mg by mouth daily at 12 noon.     Fish Oil-Cholecalciferol (FISH OIL + D3 PO) Take 1 capsule by mouth daily.     FLAXSEED, LINSEED, PO Take by mouth.     fluticasone furoate-vilanterol (BREO ELLIPTA) 200-25 MCG/ACT AEPB INHALE 1 PUFF BY MOUTH EVERY DAY 60 each 5   hydroxyurea (HYDREA) 500 MG capsule Take 1 capsule (500 mg total) by mouth 2 (two) times daily. May take with food to minimize GI side effects. 60 capsule 3   ibuprofen (ADVIL,MOTRIN) 200 MG tablet Take 200 mg by mouth every 6 (six) hours as needed for fever or headache.     lisinopril (ZESTRIL) 20 MG tablet TAKE 1 TABLET BY MOUTH IN THE MORNING  AND AT BEDTIME. 180 tablet 1   loratadine (CLARITIN) 10 MG tablet Take 10 mg by mouth daily.     Multiple Vitamin (MULTIVITAMIN) tablet Take 1 tablet by mouth daily.     Tiotropium Bromide Monohydrate (SPIRIVA RESPIMAT) 2.5 MCG/ACT AERS INHALE 2 PUFFS INTO THE LUNGS EVERY DAY 12 g 2   Zinc 50 MG CAPS Take 50 mg by mouth daily.     No current facility-administered medications for this visit.     Allergies:  Allergies  Allergen Reactions   Advair Diskus [Fluticasone-Salmeterol] Shortness Of Breath    As of 09/01/2014. Pt can take Breo   Levofloxacin Other (See Comments)    Nausea, brain fog, depression, as of 12/12/14   Qvar [Beclomethasone] Other (See Comments)    Lungs closing-as of 05/03/2015   Singulair [Montelukast] Shortness Of Breath   Symbicort [Budesonide-Formoterol Fumarate] Other (See Comments)    Hard time breathing as of 10/31/2014.     Physical Exam: Blood pressure 140/71, pulse 85, temperature 97.8 F (36.6 C), temperature source Temporal, resp. rate 17, height '5\' 3"'$  (1.6 m), weight 159 lb 6.4 oz (72.3 kg), SpO2 99 %.  ECOG: 0     General appearance: Alert, awake without any distress. Head: Atraumatic without abnormalities Oropharynx: Without any thrush or ulcers. Eyes: No scleral icterus. Lymph nodes: No lymphadenopathy noted in the cervical, supraclavicular, or axillary nodes Heart:regular rate and rhythm, without any murmurs or gallops.   Lung: Clear  to auscultation without any rhonchi, wheezes or dullness to percussion. Abdomin: Soft, nontender without any shifting dullness or ascites. Musculoskeletal: No clubbing or cyanosis. Neurological: No motor or sensory deficits. Skin: No rashes or lesions.         Lab Results: Lab Results  Component Value Date   WBC 9.7 10/11/2021   HGB 12.9 10/11/2021   HCT 38.7 10/11/2021   MCV 96.5 10/11/2021   PLT 982 (HH) 10/11/2021     Chemistry      Component Value Date/Time   NA 136 07/30/2021 1524   NA  140 01/06/2017 0000   K 5.1 08/02/2021 1513   CL 102 07/30/2021 1524   CO2 27 07/30/2021 1524   BUN 26 (H) 07/30/2021 1524   BUN 15 01/06/2017 0000   CREATININE 0.81 07/30/2021 1524   CREATININE 0.84 01/07/2020 1024   GLU 119 01/06/2017 0000      Component Value Date/Time   CALCIUM 11.8 (H) 07/30/2021 1524   CALCIUM 10.8 03/24/2017 0000   ALKPHOS 88 07/30/2021 1524   AST 19 07/30/2021 1524   AST 37 01/07/2020 1024   ALT 16 07/30/2021 1524   ALT 74 (H) 01/07/2020 1024   BILITOT 0.4 07/30/2021 1524   BILITOT 0.7 01/07/2020 1024         Impression and Plan:   81 year old woman with:   1.  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  The natural course of this disease was discussed again and treatment choices were reviewed.  She is willing to continue taking this medication and definitely specially at the current dose.  Although it is unclear to me the nature of the side effects that she is experiencing but she simply does not like the way she feels on it.  Based on these findings, I have recommended reducing the dose to 500 mg daily.  She understands that this will increase her risk of thrombocytosis and possible venous and arterial thrombosis.  She is willing to take that risk and will consider taking this medication once a day.  She also desires to continue to monitor her blood via her primary care physician and will return here as needed.      2.  Thrombosis prophylaxis: She continues to be on aspirin without any thrombosis.  Her risk overall is low with this specific mutation.     3. Follow-up: She will return in 6 months for a follow-up evaluation.   30  minutes were dedicated to this visit.  The time spent on reviewing laboratory data, disease status update, treatment choices and future plan of care discussion.     Zola Button, MD 5/25/20233:40 PM

## 2021-11-03 ENCOUNTER — Other Ambulatory Visit: Payer: Self-pay | Admitting: Oncology

## 2021-11-07 DIAGNOSIS — H52223 Regular astigmatism, bilateral: Secondary | ICD-10-CM | POA: Diagnosis not present

## 2021-11-07 DIAGNOSIS — H5213 Myopia, bilateral: Secondary | ICD-10-CM | POA: Diagnosis not present

## 2021-11-07 DIAGNOSIS — Z961 Presence of intraocular lens: Secondary | ICD-10-CM | POA: Diagnosis not present

## 2021-11-07 DIAGNOSIS — H35372 Puckering of macula, left eye: Secondary | ICD-10-CM | POA: Diagnosis not present

## 2021-11-07 DIAGNOSIS — H43813 Vitreous degeneration, bilateral: Secondary | ICD-10-CM | POA: Diagnosis not present

## 2021-11-07 DIAGNOSIS — H524 Presbyopia: Secondary | ICD-10-CM | POA: Diagnosis not present

## 2021-11-07 DIAGNOSIS — H353131 Nonexudative age-related macular degeneration, bilateral, early dry stage: Secondary | ICD-10-CM | POA: Diagnosis not present

## 2021-12-21 DIAGNOSIS — D692 Other nonthrombocytopenic purpura: Secondary | ICD-10-CM | POA: Diagnosis not present

## 2021-12-21 DIAGNOSIS — R059 Cough, unspecified: Secondary | ICD-10-CM | POA: Diagnosis not present

## 2021-12-21 DIAGNOSIS — H3530 Unspecified macular degeneration: Secondary | ICD-10-CM | POA: Diagnosis not present

## 2021-12-21 DIAGNOSIS — Z91119 Patient's noncompliance with dietary regimen due to unspecified reason: Secondary | ICD-10-CM | POA: Diagnosis not present

## 2021-12-21 DIAGNOSIS — I10 Essential (primary) hypertension: Secondary | ICD-10-CM | POA: Diagnosis not present

## 2021-12-21 DIAGNOSIS — J449 Chronic obstructive pulmonary disease, unspecified: Secondary | ICD-10-CM | POA: Diagnosis not present

## 2021-12-24 ENCOUNTER — Encounter (INDEPENDENT_AMBULATORY_CARE_PROVIDER_SITE_OTHER): Payer: PPO | Admitting: Ophthalmology

## 2021-12-24 ENCOUNTER — Other Ambulatory Visit: Payer: Self-pay | Admitting: Family Medicine

## 2021-12-24 DIAGNOSIS — H43813 Vitreous degeneration, bilateral: Secondary | ICD-10-CM | POA: Diagnosis not present

## 2021-12-24 DIAGNOSIS — J4530 Mild persistent asthma, uncomplicated: Secondary | ICD-10-CM

## 2021-12-24 DIAGNOSIS — J441 Chronic obstructive pulmonary disease with (acute) exacerbation: Secondary | ICD-10-CM

## 2021-12-24 DIAGNOSIS — I10 Essential (primary) hypertension: Secondary | ICD-10-CM | POA: Diagnosis not present

## 2021-12-24 DIAGNOSIS — H353132 Nonexudative age-related macular degeneration, bilateral, intermediate dry stage: Secondary | ICD-10-CM

## 2021-12-24 DIAGNOSIS — H35033 Hypertensive retinopathy, bilateral: Secondary | ICD-10-CM

## 2021-12-25 NOTE — Telephone Encounter (Signed)
Refill request Albuterol nebulizer Last refill 03/27/20   75 ml/2 Last office visit 07/30/21

## 2021-12-26 ENCOUNTER — Telehealth: Payer: Self-pay | Admitting: Oncology

## 2021-12-26 NOTE — Telephone Encounter (Signed)
Called patient regarding November appointment, patient wanted all appointments to be cancelled and does not wish to be rescheduled.

## 2022-01-10 ENCOUNTER — Telehealth: Payer: Self-pay

## 2022-01-10 NOTE — Telephone Encounter (Signed)
Last OV on 07/30/21 notes: Return in about 6 months (around 01/30/2022) for for check in.  No future appt scheduled.  LVM instructions for pt to call office to schedule appt.

## 2022-01-15 ENCOUNTER — Encounter (INDEPENDENT_AMBULATORY_CARE_PROVIDER_SITE_OTHER): Payer: PPO | Admitting: Ophthalmology

## 2022-01-15 DIAGNOSIS — H353132 Nonexudative age-related macular degeneration, bilateral, intermediate dry stage: Secondary | ICD-10-CM | POA: Diagnosis not present

## 2022-01-20 ENCOUNTER — Other Ambulatory Visit: Payer: Self-pay | Admitting: Family Medicine

## 2022-01-20 DIAGNOSIS — J4530 Mild persistent asthma, uncomplicated: Secondary | ICD-10-CM

## 2022-01-31 ENCOUNTER — Ambulatory Visit (INDEPENDENT_AMBULATORY_CARE_PROVIDER_SITE_OTHER): Payer: PPO | Admitting: Family Medicine

## 2022-01-31 ENCOUNTER — Telehealth: Payer: Self-pay | Admitting: *Deleted

## 2022-01-31 VITALS — BP 130/80 | HR 74 | Temp 97.4°F | Wt 158.5 lb

## 2022-01-31 DIAGNOSIS — N3946 Mixed incontinence: Secondary | ICD-10-CM | POA: Diagnosis not present

## 2022-01-31 DIAGNOSIS — D75839 Thrombocytosis, unspecified: Secondary | ICD-10-CM | POA: Diagnosis not present

## 2022-01-31 DIAGNOSIS — J411 Mucopurulent chronic bronchitis: Secondary | ICD-10-CM

## 2022-01-31 DIAGNOSIS — R5383 Other fatigue: Secondary | ICD-10-CM | POA: Insufficient documentation

## 2022-01-31 DIAGNOSIS — D473 Essential (hemorrhagic) thrombocythemia: Secondary | ICD-10-CM | POA: Insufficient documentation

## 2022-01-31 DIAGNOSIS — R7303 Prediabetes: Secondary | ICD-10-CM | POA: Insufficient documentation

## 2022-01-31 DIAGNOSIS — I10 Essential (primary) hypertension: Secondary | ICD-10-CM

## 2022-01-31 LAB — CBC
HCT: 40.8 % (ref 36.0–46.0)
Hemoglobin: 13.7 g/dL (ref 12.0–15.0)
MCHC: 33.5 g/dL (ref 30.0–36.0)
MCV: 92.5 fl (ref 78.0–100.0)
Platelets: 1423 10*3/uL (ref 150.0–400.0)
RBC: 4.41 Mil/uL (ref 3.87–5.11)
RDW: 14.9 % (ref 11.5–15.5)
WBC: 7.5 10*3/uL (ref 4.0–10.5)

## 2022-01-31 LAB — COMPREHENSIVE METABOLIC PANEL
ALT: 12 U/L (ref 0–35)
AST: 15 U/L (ref 0–37)
Albumin: 3.8 g/dL (ref 3.5–5.2)
Alkaline Phosphatase: 89 U/L (ref 39–117)
BUN: 27 mg/dL — ABNORMAL HIGH (ref 6–23)
CO2: 25 mEq/L (ref 19–32)
Calcium: 10.6 mg/dL — ABNORMAL HIGH (ref 8.4–10.5)
Chloride: 105 mEq/L (ref 96–112)
Creatinine, Ser: 0.8 mg/dL (ref 0.40–1.20)
GFR: 68.35 mL/min (ref >60.00)
Glucose, Bld: 88 mg/dL (ref 70–99)
Potassium: 5.5 mEq/L — ABNORMAL HIGH (ref 3.5–5.1)
Sodium: 138 mEq/L (ref 135–145)
Total Bilirubin: 0.5 mg/dL (ref 0.2–1.2)
Total Protein: 6.5 g/dL (ref 6.0–8.3)

## 2022-01-31 LAB — VITAMIN D 25 HYDROXY (VIT D DEFICIENCY, FRACTURES): VITD: 34.84 ng/mL (ref 30.00–100.00)

## 2022-01-31 LAB — VITAMIN B12: Vitamin B-12: 1075 pg/mL — ABNORMAL HIGH (ref 211–911)

## 2022-01-31 LAB — HEMOGLOBIN A1C: Hgb A1c MFr Bld: 6.3 % (ref 4.6–6.5)

## 2022-01-31 LAB — TSH: TSH: 2.21 u[IU]/mL (ref 0.35–5.50)

## 2022-01-31 NOTE — Assessment & Plan Note (Signed)
Stable on Breo and spiriva. Rare nebulizer and O2

## 2022-01-31 NOTE — Assessment & Plan Note (Signed)
Blood pressure normal today.  Continue off of medications.

## 2022-01-31 NOTE — Assessment & Plan Note (Signed)
Discussed option of medication versus pelvic floor therapy versus urogyn.  She does not want another medication, referral to pelvic floor therapy for assessment and treatment.  If no improvement and pelvic floor prolapse urogyn may be helpful in the future for possible pessary. Do Wonder if nighttime urination is contributing to poor sleep which is causing some of her daytime fatigue.

## 2022-01-31 NOTE — Telephone Encounter (Signed)
[name] with [facility] called with critical lab results  Pt's Platelet is 1423  Results given to Dr. Damita Dunnings who is here in the office and also will route phone note to PCP

## 2022-01-31 NOTE — Telephone Encounter (Signed)
Routed to PCP as this is an ongoing issue for patient.

## 2022-01-31 NOTE — Patient Instructions (Addendum)
#  Referral I have placed a referral to a specialist for you. You should receive a phone call from the specialty office. Make sure your voicemail is not full and that if you are able to answer your phone to unknown or new numbers.   It may take up to 2 weeks to hear about the referral. If you do not hear anything in 2 weeks, please call our office and ask to speak with the referral coordinator.  - Pelvic Floor Rehab  Fatigue - lab work-up today - focus on improvement in urination

## 2022-01-31 NOTE — Assessment & Plan Note (Signed)
Suspect this may be secondary to poor sleep, addressing urinary frequency with pelvic floor therapy.  Discussed getting labs to evaluate.

## 2022-01-31 NOTE — Progress Notes (Signed)
Subjective:     Nancy Merritt is a 82 y.o. female presenting for Follow-up (4mo     HPI  #Macular degeneration  - seeing a specialist - losing her vision  #tired - has the dogs sleeping with her - feels tired sometimes - getting up 2 times overnight to urinate and occasionally has a hard time falling back to sleep - daytime urination - frequently - also some urgency  - stress incontinence - tries to empty her bladder - trying to drink water  Breathing is good Worse with the weather changes Checking oxygen and will use O2 if low Using nebulizer  - rare for both of these  HTN - was taking bp at home  - did stop recently - 117/70 - not taking medications   Review of Systems   Social History   Tobacco Use  Smoking Status Former   Packs/day: 1.00   Years: 40.00   Total pack years: 40.00   Types: Cigarettes   Quit date: 2002   Years since quitting: 21.7  Smokeless Tobacco Never        Objective:    BP Readings from Last 3 Encounters:  01/31/22 130/80  10/11/21 140/71  09/13/21 140/65   Wt Readings from Last 3 Encounters:  01/31/22 158 lb 8 oz (71.9 kg)  10/11/21 159 lb 6.4 oz (72.3 kg)  09/13/21 159 lb 9.6 oz (72.4 kg)    BP 130/80   Pulse 74   Temp (!) 97.4 F (36.3 C) (Temporal)   Wt 158 lb 8 oz (71.9 kg)   SpO2 96%   BMI 28.08 kg/m    Physical Exam Constitutional:      General: She is not in acute distress.    Appearance: She is well-developed. She is not diaphoretic.  HENT:     Right Ear: External ear normal.     Left Ear: External ear normal.     Nose: Nose normal.  Eyes:     Conjunctiva/sclera: Conjunctivae normal.  Cardiovascular:     Rate and Rhythm: Normal rate and regular rhythm.     Heart sounds: Murmur heard.  Pulmonary:     Effort: Pulmonary effort is normal.     Breath sounds: Wheezing (Right>left) present.  Musculoskeletal:     Cervical back: Neck supple.  Skin:    General: Skin is warm and dry.      Capillary Refill: Capillary refill takes less than 2 seconds.  Neurological:     Mental Status: She is alert. Mental status is at baseline.  Psychiatric:        Mood and Affect: Mood normal.        Behavior: Behavior normal.           Assessment & Plan:   Problem List Items Addressed This Visit       Cardiovascular and Mediastinum   Hypertension    Blood pressure normal today.  Continue off of medications.      Relevant Orders   CBC   Comprehensive metabolic panel     Respiratory   COPD (chronic obstructive pulmonary disease) (HCC)    Stable on Breo and spiriva. Rare nebulizer and O2        Hematopoietic and Hemostatic   Thrombocytosis, unspecified    She was following with oncology, but is no longer taking hydroxyurea or seeing them.  Repeat labs today.        Other   Prediabetes   Relevant Orders   Hemoglobin A1c  Other fatigue    Suspect this may be secondary to poor sleep, addressing urinary frequency with pelvic floor therapy.  Discussed getting labs to evaluate.      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  She does not want another medication, referral to pelvic floor therapy for assessment and treatment.  If no improvement and pelvic floor prolapse urogyn may be helpful in the future for possible pessary. Do Wonder if nighttime urination is contributing to poor sleep which is causing some of her daytime fatigue.      Relevant Orders   Ambulatory referral to Physical Therapy     Return in about 4 months (around 06/02/2022) for [facility] .  Lesleigh Noe, MD

## 2022-01-31 NOTE — Assessment & Plan Note (Signed)
She was following with oncology, but is no longer taking hydroxyurea or seeing them.  Repeat labs today.

## 2022-02-01 ENCOUNTER — Other Ambulatory Visit: Payer: Self-pay | Admitting: Family Medicine

## 2022-02-01 DIAGNOSIS — E875 Hyperkalemia: Secondary | ICD-10-CM

## 2022-02-01 NOTE — Telephone Encounter (Signed)
Patient advised of lab results. Patient is not willing to go back on hydroxyurea. Patient is taking Multivitamin that has B12 50 mg in it, does not take an additional B12 supplement. Lab for recheck scheduled for 02/05/22.

## 2022-02-01 NOTE — Telephone Encounter (Signed)
Attempted to call patient, however voicemail is full.  Chart message to patient.  She has stopped her hydroxyurea, will encourage her to restart.  We will route to MA to try to call patient again before the end of the day. See Result note for more details

## 2022-02-04 NOTE — Telephone Encounter (Signed)
Noted. We will see what the follow-up labs show.

## 2022-02-05 ENCOUNTER — Other Ambulatory Visit (INDEPENDENT_AMBULATORY_CARE_PROVIDER_SITE_OTHER): Payer: PPO

## 2022-02-05 DIAGNOSIS — E875 Hyperkalemia: Secondary | ICD-10-CM | POA: Diagnosis not present

## 2022-02-05 LAB — POTASSIUM: Potassium: 4.4 mEq/L (ref 3.5–5.1)

## 2022-02-06 LAB — PTH, INTACT AND CALCIUM
Calcium: 10.9 mg/dL — ABNORMAL HIGH (ref 8.6–10.4)
PTH: 119 pg/mL — ABNORMAL HIGH (ref 16–77)

## 2022-02-13 NOTE — Progress Notes (Signed)
As I will be accepting pt for transfer of care I would also like to suggest we make a referral for endo, as it would likely take about 4-6 months to even get an appointment. I would hate to wait, and recheck in January and it has increased when pt was already mildly symptomatic. I did see she is experiencing fatigue and this could very likely be from hyperparathyroidism. I would suggest if she is still taking otc calcium to d/c. She can still take daily vitamin D, just not calcium as with a improperly function parathyroid gland the calcium will not be properly absorbed.

## 2022-02-19 ENCOUNTER — Other Ambulatory Visit: Payer: Self-pay

## 2022-02-19 ENCOUNTER — Encounter: Payer: Self-pay | Admitting: Physical Therapy

## 2022-02-19 ENCOUNTER — Ambulatory Visit: Payer: PPO | Attending: Family Medicine | Admitting: Physical Therapy

## 2022-02-19 DIAGNOSIS — M6281 Muscle weakness (generalized): Secondary | ICD-10-CM | POA: Insufficient documentation

## 2022-02-19 DIAGNOSIS — R279 Unspecified lack of coordination: Secondary | ICD-10-CM | POA: Insufficient documentation

## 2022-02-19 DIAGNOSIS — N3946 Mixed incontinence: Secondary | ICD-10-CM | POA: Diagnosis not present

## 2022-02-19 NOTE — Addendum Note (Signed)
Addended by: Su Hoff on: 02/19/2022 02:14 PM   Modules accepted: Orders
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
## 2022-03-21 ENCOUNTER — Ambulatory Visit: Payer: PPO | Attending: Family Medicine | Admitting: Physical Therapy

## 2022-03-21 DIAGNOSIS — M6281 Muscle weakness (generalized): Secondary | ICD-10-CM | POA: Diagnosis not present

## 2022-03-21 DIAGNOSIS — N3946 Mixed incontinence: Secondary | ICD-10-CM | POA: Insufficient documentation

## 2022-03-21 DIAGNOSIS — R279 Unspecified lack of coordination: Secondary | ICD-10-CM | POA: Insufficient documentation
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
## 2022-03-28 ENCOUNTER — Encounter (INDEPENDENT_AMBULATORY_CARE_PROVIDER_SITE_OTHER): Payer: PPO | Admitting: Ophthalmology

## 2022-03-28 DIAGNOSIS — H35033 Hypertensive retinopathy, bilateral: Secondary | ICD-10-CM | POA: Diagnosis not present

## 2022-03-28 DIAGNOSIS — H353132 Nonexudative age-related macular degeneration, bilateral, intermediate dry stage: Secondary | ICD-10-CM

## 2022-03-28 DIAGNOSIS — I10 Essential (primary) hypertension: Secondary | ICD-10-CM

## 2022-03-28 DIAGNOSIS — H43813 Vitreous degeneration, bilateral: Secondary | ICD-10-CM

## 2022-04-02 ENCOUNTER — Encounter: Payer: PPO | Admitting: Physical Therapy

## 2022-04-10 ENCOUNTER — Other Ambulatory Visit: Payer: PPO

## 2022-04-10 ENCOUNTER — Ambulatory Visit: Payer: PPO | Admitting: Oncology

## 2022-04-18 ENCOUNTER — Encounter: Payer: PPO | Admitting: Physical Therapy

## 2022-04-25 ENCOUNTER — Encounter: Payer: PPO | Admitting: Physical Therapy

## 2022-05-02 ENCOUNTER — Encounter: Payer: PPO | Admitting: Physical Therapy

## 2022-05-09 ENCOUNTER — Encounter: Payer: PPO | Admitting: Physical Therapy

## 2022-06-04 ENCOUNTER — Encounter (HOSPITAL_COMMUNITY): Payer: Self-pay

## 2022-06-04 ENCOUNTER — Inpatient Hospital Stay (HOSPITAL_COMMUNITY)
Admission: EM | Admit: 2022-06-04 | Discharge: 2022-06-06 | DRG: 280 | Disposition: A | Discharge status: Home / Self Care | Payer: PPO | Attending: Internal Medicine | Admitting: Internal Medicine

## 2022-06-04 ENCOUNTER — Emergency Department (HOSPITAL_COMMUNITY): Payer: PPO

## 2022-06-04 ENCOUNTER — Ambulatory Visit (INDEPENDENT_AMBULATORY_CARE_PROVIDER_SITE_OTHER): Payer: PPO | Admitting: Family Medicine

## 2022-06-04 ENCOUNTER — Encounter: Payer: Self-pay | Admitting: Family Medicine

## 2022-06-04 ENCOUNTER — Other Ambulatory Visit: Payer: Self-pay

## 2022-06-04 ENCOUNTER — Encounter: Payer: PPO | Admitting: Family

## 2022-06-04 VITALS — BP 156/80 | HR 72 | Temp 97.6°F | Ht 63.0 in | Wt 168.0 lb

## 2022-06-04 DIAGNOSIS — Z881 Allergy status to other antibiotic agents status: Secondary | ICD-10-CM

## 2022-06-04 DIAGNOSIS — R079 Chest pain, unspecified: Secondary | ICD-10-CM | POA: Diagnosis not present

## 2022-06-04 DIAGNOSIS — J411 Mucopurulent chronic bronchitis: Secondary | ICD-10-CM

## 2022-06-04 DIAGNOSIS — Z7982 Long term (current) use of aspirin: Secondary | ICD-10-CM

## 2022-06-04 DIAGNOSIS — Z87891 Personal history of nicotine dependence: Secondary | ICD-10-CM

## 2022-06-04 DIAGNOSIS — F32A Depression, unspecified: Secondary | ICD-10-CM | POA: Diagnosis not present

## 2022-06-04 DIAGNOSIS — R059 Cough, unspecified: Secondary | ICD-10-CM | POA: Diagnosis not present

## 2022-06-04 DIAGNOSIS — I10 Essential (primary) hypertension: Secondary | ICD-10-CM | POA: Diagnosis present

## 2022-06-04 DIAGNOSIS — Z811 Family history of alcohol abuse and dependence: Secondary | ICD-10-CM

## 2022-06-04 DIAGNOSIS — Z9981 Dependence on supplemental oxygen: Secondary | ICD-10-CM

## 2022-06-04 DIAGNOSIS — D75839 Thrombocytosis, unspecified: Secondary | ICD-10-CM | POA: Diagnosis present

## 2022-06-04 DIAGNOSIS — Z8616 Personal history of COVID-19: Secondary | ICD-10-CM

## 2022-06-04 DIAGNOSIS — J9611 Chronic respiratory failure with hypoxia: Secondary | ICD-10-CM | POA: Insufficient documentation

## 2022-06-04 DIAGNOSIS — I110 Hypertensive heart disease with heart failure: Secondary | ICD-10-CM | POA: Diagnosis present

## 2022-06-04 DIAGNOSIS — R0789 Other chest pain: Secondary | ICD-10-CM | POA: Diagnosis not present

## 2022-06-04 DIAGNOSIS — I2510 Atherosclerotic heart disease of native coronary artery without angina pectoris: Secondary | ICD-10-CM | POA: Diagnosis not present

## 2022-06-04 DIAGNOSIS — I252 Old myocardial infarction: Secondary | ICD-10-CM | POA: Diagnosis not present

## 2022-06-04 DIAGNOSIS — Z7951 Long term (current) use of inhaled steroids: Secondary | ICD-10-CM

## 2022-06-04 DIAGNOSIS — E782 Mixed hyperlipidemia: Secondary | ICD-10-CM

## 2022-06-04 DIAGNOSIS — Z888 Allergy status to other drugs, medicaments and biological substances status: Secondary | ICD-10-CM | POA: Diagnosis not present

## 2022-06-04 DIAGNOSIS — Z818 Family history of other mental and behavioral disorders: Secondary | ICD-10-CM

## 2022-06-04 DIAGNOSIS — E785 Hyperlipidemia, unspecified: Secondary | ICD-10-CM | POA: Diagnosis present

## 2022-06-04 DIAGNOSIS — Z96641 Presence of right artificial hip joint: Secondary | ICD-10-CM | POA: Diagnosis not present

## 2022-06-04 DIAGNOSIS — J9610 Chronic respiratory failure, unspecified whether with hypoxia or hypercapnia: Secondary | ICD-10-CM | POA: Diagnosis not present

## 2022-06-04 DIAGNOSIS — Z801 Family history of malignant neoplasm of trachea, bronchus and lung: Secondary | ICD-10-CM

## 2022-06-04 DIAGNOSIS — J42 Unspecified chronic bronchitis: Secondary | ICD-10-CM

## 2022-06-04 DIAGNOSIS — Z813 Family history of other psychoactive substance abuse and dependence: Secondary | ICD-10-CM

## 2022-06-04 DIAGNOSIS — I700 Atherosclerosis of aorta: Secondary | ICD-10-CM | POA: Diagnosis present

## 2022-06-04 DIAGNOSIS — Z853 Personal history of malignant neoplasm of breast: Secondary | ICD-10-CM

## 2022-06-04 DIAGNOSIS — R000 Tachycardia, unspecified: Secondary | ICD-10-CM | POA: Diagnosis not present

## 2022-06-04 DIAGNOSIS — Z951 Presence of aortocoronary bypass graft: Secondary | ICD-10-CM

## 2022-06-04 DIAGNOSIS — Z8261 Family history of arthritis: Secondary | ICD-10-CM

## 2022-06-04 DIAGNOSIS — Z833 Family history of diabetes mellitus: Secondary | ICD-10-CM

## 2022-06-04 DIAGNOSIS — Z79899 Other long term (current) drug therapy: Secondary | ICD-10-CM

## 2022-06-04 DIAGNOSIS — Z803 Family history of malignant neoplasm of breast: Secondary | ICD-10-CM

## 2022-06-04 DIAGNOSIS — I5041 Acute combined systolic (congestive) and diastolic (congestive) heart failure: Secondary | ICD-10-CM | POA: Diagnosis present

## 2022-06-04 DIAGNOSIS — I214 Non-ST elevation (NSTEMI) myocardial infarction: Secondary | ICD-10-CM | POA: Diagnosis not present

## 2022-06-04 DIAGNOSIS — Q2112 Patent foramen ovale: Secondary | ICD-10-CM | POA: Diagnosis not present

## 2022-06-04 DIAGNOSIS — I25110 Atherosclerotic heart disease of native coronary artery with unstable angina pectoris: Secondary | ICD-10-CM | POA: Diagnosis present

## 2022-06-04 DIAGNOSIS — J449 Chronic obstructive pulmonary disease, unspecified: Secondary | ICD-10-CM | POA: Diagnosis present

## 2022-06-04 DIAGNOSIS — Z8673 Personal history of transient ischemic attack (TIA), and cerebral infarction without residual deficits: Secondary | ICD-10-CM | POA: Diagnosis not present

## 2022-06-04 DIAGNOSIS — Z8349 Family history of other endocrine, nutritional and metabolic diseases: Secondary | ICD-10-CM

## 2022-06-04 DIAGNOSIS — R072 Precordial pain: Secondary | ICD-10-CM

## 2022-06-04 DIAGNOSIS — Z825 Family history of asthma and other chronic lower respiratory diseases: Secondary | ICD-10-CM

## 2022-06-04 DIAGNOSIS — I161 Hypertensive emergency: Secondary | ICD-10-CM | POA: Diagnosis not present

## 2022-06-04 DIAGNOSIS — D473 Essential (hemorrhagic) thrombocythemia: Secondary | ICD-10-CM | POA: Diagnosis present

## 2022-06-04 DIAGNOSIS — I959 Hypotension, unspecified: Secondary | ICD-10-CM | POA: Diagnosis present

## 2022-06-04 DIAGNOSIS — Z823 Family history of stroke: Secondary | ICD-10-CM

## 2022-06-04 DIAGNOSIS — F339 Major depressive disorder, recurrent, unspecified: Secondary | ICD-10-CM | POA: Diagnosis present

## 2022-06-04 DIAGNOSIS — Z8249 Family history of ischemic heart disease and other diseases of the circulatory system: Secondary | ICD-10-CM

## 2022-06-04 DIAGNOSIS — I5042 Chronic combined systolic (congestive) and diastolic (congestive) heart failure: Secondary | ICD-10-CM

## 2022-06-04 LAB — CBC
HCT: 38.1 % (ref 36.0–46.0)
Hemoglobin: 12.2 g/dL (ref 12.0–15.0)
MCH: 29.5 pg (ref 26.0–34.0)
MCHC: 32 g/dL (ref 30.0–36.0)
MCV: 92.3 fL (ref 80.0–100.0)
Platelets: 1377 10*3/uL (ref 150–400)
RBC: 4.13 MIL/uL (ref 3.87–5.11)
RDW: 16 % — ABNORMAL HIGH (ref 11.5–15.5)
WBC: 10.4 10*3/uL (ref 4.0–10.5)
nRBC: 0 % (ref 0.0–0.2)

## 2022-06-04 LAB — BASIC METABOLIC PANEL
Anion gap: 7 (ref 5–15)
BUN: 22 mg/dL (ref 8–23)
CO2: 24 mmol/L (ref 22–32)
Calcium: 10 mg/dL (ref 8.9–10.3)
Chloride: 110 mmol/L (ref 98–111)
Creatinine, Ser: 0.87 mg/dL (ref 0.44–1.00)
GFR, Estimated: >60 mL/min (ref >60)
Glucose, Bld: 96 mg/dL (ref 70–99)
Potassium: 4.3 mmol/L (ref 3.5–5.1)
Sodium: 141 mmol/L (ref 135–145)

## 2022-06-04 LAB — TROPONIN I (HIGH SENSITIVITY)
Troponin I (High Sensitivity): 253 ng/L (ref <18)
Troponin I (High Sensitivity): 574 ng/L (ref <18)

## 2022-06-04 MED ORDER — ACETAMINOPHEN 325 MG PO TABS
650.0000 mg | ORAL_TABLET | Freq: Four times a day (QID) | ORAL | Status: DC | PRN
  Administered 2022-06-05 – 2022-06-06 (×3): 650 mg via ORAL
  Filled 2022-06-04 (×3): qty 2

## 2022-06-04 MED ORDER — SENNOSIDES-DOCUSATE SODIUM 8.6-50 MG PO TABS: 1.0000 | ORAL_TABLET | Freq: Every evening | ORAL | Status: DC | PRN

## 2022-06-04 MED ORDER — SPIRIVA RESPIMAT 2.5 MCG/ACT IN AERS: INHALATION_SPRAY | RESPIRATORY_TRACT | 2 refills | Status: DC

## 2022-06-04 MED ORDER — NITROGLYCERIN 0.4 MG SL SUBL
0.4000 mg | SUBLINGUAL_TABLET | Freq: Once | SUBLINGUAL | Status: AC
  Administered 2022-06-04: 0.4 mg via SUBLINGUAL

## 2022-06-04 MED ORDER — ACETAMINOPHEN 650 MG RE SUPP: 650.0000 mg | Freq: Four times a day (QID) | RECTAL | Status: DC | PRN

## 2022-06-04 MED ORDER — EZETIMIBE 10 MG PO TABS
10.0000 mg | ORAL_TABLET | Freq: Every day | ORAL | Status: DC
  Administered 2022-06-06: 10 mg via ORAL
  Filled 2022-06-04: qty 1

## 2022-06-04 MED ORDER — CARVEDILOL 3.125 MG PO TABS: 6.2500 mg | ORAL_TABLET | Freq: Two times a day (BID) | ORAL | Status: DC

## 2022-06-04 MED ORDER — ASPIRIN 325 MG PO TABS: 650.0000 mg | ORAL_TABLET | Freq: Four times a day (QID) | ORAL | Status: DC | PRN

## 2022-06-04 MED ORDER — HEPARIN (PORCINE) 25000 UT/250ML-% IV SOLN
1000.0000 [IU]/h | INTRAVENOUS | Status: DC
  Administered 2022-06-04: 800 [IU]/h via INTRAVENOUS
  Filled 2022-06-04: qty 250

## 2022-06-04 MED ORDER — METOPROLOL TARTRATE 5 MG/5ML IV SOLN: 5.0000 mg | INTRAVENOUS | Status: DC | PRN

## 2022-06-04 MED ORDER — HEPARIN BOLUS VIA INFUSION
4000.0000 [IU] | Freq: Once | INTRAVENOUS | Status: AC
  Administered 2022-06-04: 4000 [IU] via INTRAVENOUS
  Filled 2022-06-04: qty 4000

## 2022-06-04 MED ORDER — ASPIRIN 81 MG PO CHEW
324.0000 mg | CHEWABLE_TABLET | Freq: Once | ORAL | Status: AC
  Administered 2022-06-04: 324 mg via ORAL
  Filled 2022-06-04: qty 4

## 2022-06-04 MED ORDER — NITROGLYCERIN 0.4 MG SL SUBL: 0.4000 mg | SUBLINGUAL_TABLET | SUBLINGUAL | Status: DC | PRN

## 2022-06-04 MED ORDER — ROSUVASTATIN CALCIUM 20 MG PO TABS
20.0000 mg | ORAL_TABLET | Freq: Every day | ORAL | Status: DC
  Administered 2022-06-04 – 2022-06-05 (×2): 20 mg via ORAL
  Filled 2022-06-04 (×2): qty 1

## 2022-06-04 MED ORDER — SODIUM CHLORIDE 0.9% FLUSH
3.0000 mL | Freq: Two times a day (BID) | INTRAVENOUS | Status: DC
  Administered 2022-06-04 – 2022-06-06 (×5): 3 mL via INTRAVENOUS

## 2022-06-04 MED ORDER — ASPIRIN 325 MG PO TABS: 325.0000 mg | ORAL_TABLET | Freq: Every day | ORAL | Status: DC

## 2022-06-04 NOTE — Patient Instructions (Addendum)
I think you need to go to the ER for further evaluation today.  Go via EMS. We will await their evaluation.

## 2022-06-04 NOTE — Assessment & Plan Note (Signed)
H/o quadruple bypass remotely in [place].  Last saw Dr Johney Frame 08/2020.  Not on statin. Taking aspiring '650mg'$  QID PRN arthritis pain.  Will need expedited evaluation of current chest pressure.

## 2022-06-04 NOTE — Consult Note (Signed)
Cardiology Consultation   [name] MRN: [id number]; DOB: [birth year]  Admit date: 06/04/2022 Date of Consult: 06/04/2022  PCP:  Ria Bush, [place] [facility] Cardiologist:  None     Patient Profile:   Nancy Merritt is a 82 y.o. female with a hx of CAD, s.p CABG 14 yrs ago, HTN, COPD, on 2 L [place] and COVID infection in 2022, asthma, depression and CVA. who is being seen 06/04/2022 for the evaluation of chest pain at the request of Dr. Billy Fischer .  History of Present Illness:   Nancy Merritt with above hx, and MI 14 yrs ago in [place] with s/p CABG 4V.  She had CVA in 2021 in [place].  No known arrhythmias.  Symptoms resolved.  She did wear a monitor to eval for a fib with hx of CVA and no atrial fib was seen.  TTE with EF 60-65%, G1DD, normal RV and systolic function, mild Aortic sclerosis, + PFO but unlikely to be source of CVA.   The patient states she was doing well until a couple of weeks ago when she developed a cough and worsening fatigue. Today, shortly after she woke up, she developed chest pressure. She was seen by her PCP where she had continued symptoms and was recommended to go to the ER.   In the ER, labs notable for trop 253>574. Plt 1377 (chronic). ECG with NSR, old septal infarct, no acute ischemic changes. CXR without acute process. She was given ASA, nitro and started on a heparin gtt. Currently, the patient is chest pain free and comfortable.    Past Medical History:  Diagnosis Date   Arthritis    Asthma    Cancer ([place]) 1990   COVID-19 06/14/2020   Depression     Past Surgical History:  Procedure Laterality Date   BREAST SURGERY Left 1990   CORONARY ARTERY BYPASS GRAFT     JOINT REPLACEMENT Right    Hip   OVARIAN CYST REMOVAL  1970   TUBAL LIGATION  1970     Home Medications:  Prior to Admission medications   Medication Sig Start Date End Date Taking? Authorizing Provider  albuterol (PROVENTIL)  (2.5 MG/3ML) 0.083% nebulizer solution INHALE 1 VIAL VIA NEBULIZER 3 TIMES A DAY AS NEEDED 12/25/21   Waunita Schooner, MD  albuterol (VENTOLIN HFA) 108 (90 Base) MCG/ACT inhaler Inhale 2 puffs into the lungs every 4 (four) hours as needed for wheezing or shortness of breath. 07/04/21   Waunita Schooner, MD  aspirin 325 MG tablet Take 2 tablets (650 mg total) by mouth 4 (four) times daily as needed for moderate pain. 06/04/22   Ria Bush, MD  BREO ELLIPTA [phone] MCG/ACT AEPB INHALE 1 PUFF BY MOUTH EVERY DAY 01/22/22   Waunita Schooner, MD  ibuprofen (ADVIL,MOTRIN) 200 MG tablet Take 200 mg by mouth every 6 (six) hours as needed for fever or headache.    [provider]  loratadine (CLARITIN) 10 MG tablet Take 10 mg by mouth daily.    [provider]  Multiple Vitamin (MULTIVITAMIN) tablet Take 1 tablet by mouth daily.    [provider]  Multiple Vitamins-Minerals (PRESERVISION AREDS 2) CAPS Take 2 each by mouth daily at 12 noon.    [provider]  Tiotropium Bromide Monohydrate (SPIRIVA RESPIMAT) 2.5 MCG/ACT AERS INHALE 2 PUFFS INTO THE LUNGS EVERY DAY 06/04/22   Ria Bush, MD  Zinc 50 MG CAPS Take 50 mg by mouth daily.  [provider]    Inpatient Medications: Scheduled Meds:  aspirin  324 mg Oral Once   heparin  4,000 Units Intravenous Once   sodium chloride flush  3 mL Intravenous Q12H   Continuous Infusions:  heparin     PRN Meds:   Allergies:    Allergies  Allergen Reactions   Advair Diskus [Fluticasone-Salmeterol] Shortness Of Breath    As of 09/01/2014. Pt can take Breo   Levofloxacin Other (See Comments)    Nausea, brain fog, depression, as of 12/12/14   Qvar [Beclomethasone] Other (See Comments)    Lungs closing-as of 05/03/2015   Singulair [Montelukast] Shortness Of Breath   Symbicort [Budesonide-Formoterol Fumarate] Other (See Comments)    Hard time breathing as of 10/31/2014.    Social History:   Social History    Socioeconomic History   Marital status: Widowed    Spouse name: Not on file   Number of children: 3   Years of education: 2 associate degrees   Highest education level: Not on file  Occupational History   Not on file  Tobacco Use   Smoking status: Former    Packs/day: 1.00    Years: 40.00    Total pack years: 40.00    Types: Cigarettes    Quit date: 2002    Years since quitting: 22.0   Smokeless tobacco: Never  Vaping Use   Vaping Use: Never used  Substance and Sexual Activity   Alcohol use: Not Currently   Drug use: Never   Sexual activity: Not Currently  Other Topics Concern   Not on file  Social History Narrative   Moved from [place] with Daughter, [name]  (one daughter in [place] and the other in [place]) - 5 granddaughters and 2 great-grandchildren   Enjoys needle work, reading, cook   Exercise: not really, can walk 1/2 a mile at slow pace w/o getting SOB, SOB with stairs   Sleeps on the ground floor   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)   Social Determinants of Radio broadcast assistant Strain: [place]  (06/25/2021)   Overall Financial Resource Strain (CARDIA)    Difficulty of Paying Living Expenses: Not hard at all  Food Insecurity: No Food Insecurity (06/25/2021)   Hunger Vital Sign    Worried About Running Out of Food in the Last Year: Never true    [place] in the Last Year: Never true  Transportation Needs: No Transportation Needs (06/25/2021)   PRAPARE - Hydrologist (Medical): No    Lack of Transportation (Non-Medical): No  Physical Activity: Inactive (06/25/2021)   Exercise Vital Sign    Days of Exercise per Week: 0 days    Minutes of Exercise per Session: 0 min  Stress: No Stress Concern Present (06/25/2021)   [place]    Feeling of Stress : Not at all  Social Connections: Moderately Isolated (06/25/2021)   Social Connection and  Isolation Panel [NHANES]    Frequency of Communication with Friends and Family: More than three times a week    Frequency of Social Gatherings with Friends and Family: More than three times a week    Attends Religious Services: More than 4 times per year    Active Member of [employer] or Organizations: No    Attends Archivist Meetings: Never    Marital Status: Widowed  Intimate Partner Violence: Not At Risk (06/25/2021)  Humiliation, Afraid, Rape, and Kick questionnaire    Fear of Current or Ex-Partner: No    Emotionally Abused: No    Physically Abused: No    Sexually Abused: No    Family History:    Family History  Problem Relation Age of Onset   Cancer Father        lung   Heart attack Father 96   Alcohol abuse Father    Arthritis Father    COPD Father    Diabetes Father    Hypertension Father    Breast cancer Sister 33   Arthritis Sister    Cancer Sister    COPD Sister    Drug abuse Sister    Hypertension Sister    Miscarriages / [place] Sister    Thyroid disease Sister    Arthritis Mother    COPD Mother    Diabetes Mother    Hearing loss Mother    Hypertension Mother    Miscarriages / [place] Mother    Stroke Mother    Alcohol abuse Brother    Arthritis Brother    Hypertension Brother    Arthritis Daughter    Asthma Daughter    Depression Daughter    Hypertension Daughter    71 / [place] Daughter    Thyroid disease Daughter    Arthritis Maternal Grandmother    Hypertension Maternal Grandmother    Stroke Maternal Grandmother    Thyroid disease Maternal Grandmother    Alcohol abuse Maternal Grandfather    Arthritis Maternal Grandfather    Cancer Maternal Grandfather    Depression Maternal Grandfather    Hearing loss Maternal Grandfather    Hypertension Maternal Grandfather    Arthritis Sister    Thyroid disease Sister    Thyroid disease Daughter    Hypertension Daughter    Depression Daughter    COPD Daughter    Alcohol  abuse Daughter    Arthritis Daughter    Arthritis Daughter    COPD Daughter    Depression Daughter    Hearing loss Daughter    Hypertension Daughter    Thyroid disease Daughter      ROS:  Please see the history of present illness.  All other ROS reviewed and negative.     Physical Exam/Data:   Vitals:   06/04/22 1443 06/04/22 1757 06/04/22 1830 06/04/22 1900  BP: (!) 150/67 (!) 159/79 (!) 168/73 (!) 171/89  Pulse: 74 69 66 77  Resp: 12 20 (!) 21 20  Temp: 98.4 F (36.9 C) 97.6 F (36.4 C)    SpO2: 95% 95% 100% 95%   No intake or output data in the 24 hours ending 06/04/22 1959    06/04/2022   12:53 PM 01/31/2022   11:56 AM 10/11/2021    3:21 PM  Last 3 Weights  Weight (lbs) 168 lb 158 lb 8 oz 159 lb 6.4 oz  Weight (kg) 76.204 kg 71.895 kg 72.303 kg     There is no height or weight on file to calculate BMI.  General:  Well nourished, well developed, in no acute distress HEENT: normal Neck: no JVD Vascular: No carotid bruits Cardiac:  normal S1, S2; RRR; no murmur  Lungs:  Diffusely wheezing Abd: soft, nontender, no hepatomegaly  Ext: no edema Musculoskeletal:  No deformities, BUE and BLE strength normal and equal Skin: warm and dry  Neuro:  CNs 2-12 intact, no focal abnormalities noted Psych:  Normal affect   EKG:  The EKG was personally reviewed and demonstrates:  NSR, septal infarct, no acute ischemic changes Telemetry:  Telemetry was personally reviewed and demonstrates:  NSR  Relevant CV Studies: TTE 07/2020: IMPRESSIONS     1. Left ventricular ejection fraction, by estimation, is 60 to 65%. The  left ventricle has normal function. The left ventricle has no regional  wall motion abnormalities. Left ventricular diastolic parameters are  consistent with Grade I diastolic  dysfunction (impaired relaxation).   2. Right ventricular systolic function is normal. The right ventricular  size is normal. There is normal pulmonary artery systolic pressure. The   estimated right ventricular systolic pressure is 19.1 mmHg.   3. The mitral valve is grossly normal. Trivial mitral valve  regurgitation.   4. The aortic valve is tricuspid. Aortic valve regurgitation is not  visualized. Mild aortic valve sclerosis is present, with no evidence of  aortic valve stenosis.   5. The inferior vena cava is normal in size with greater than 50%  respiratory variability, suggesting right atrial pressure of 3 mmHg.   6. Evidence of possible atrial level shunting suggestive of a PFO - the  atrial septum is aneurysmal. Images do not visualize the left atrium well,  but significant bubbles are seen within 3-4 beats in the left ventricle  suggesting a modest atrial level  shunt. TEE with bubble study recommended for confirmation.   Comparison(s): Prior images unable to be directly viewed, comparison made  by report only. Changes from prior study are noted. 05/04/2020: LVEF 65%,  +PFO (outside hospital report).   Laboratory Data:  High Sensitivity Troponin:   Recent Labs  Lab 06/04/22 1459 06/04/22 1712  TROPONINIHS 253* 574*     Chemistry Recent Labs  Lab 06/04/22 1459  NA 141  K 4.3  CL 110  CO2 24  GLUCOSE 96  BUN 22  CREATININE 0.87  CALCIUM 10.0  GFRNONAA >60  ANIONGAP 7    No results for input(s): "PROT", "ALBUMIN", "AST", "ALT", "ALKPHOS", "BILITOT" in the last 168 hours. Lipids No results for input(s): "CHOL", "TRIG", "HDL", "LABVLDL", "LDLCALC", "CHOLHDL" in the last 168 hours.  Hematology Recent Labs  Lab 06/04/22 1459  WBC 10.4  RBC 4.13  HGB 12.2  HCT 38.1  MCV 92.3  MCH 29.5  MCHC 32.0  RDW 16.0*  PLT 1,377*   Thyroid No results for input(s): "TSH", "FREET4" in the last 168 hours.  BNPNo results for input(s): "BNP", "PROBNP" in the last 168 hours.  DDimer No results for input(s): "DDIMER" in the last 168 hours.   Radiology/Studies:  DG Chest 2 View  Result Date: 06/04/2022 CLINICAL DATA:  Chest pain EXAM: CHEST - 2  VIEW COMPARISON:  06/15/2020 FINDINGS: Sternotomy wires overlie normal cardiac silhouette. Normal pulmonary vasculature. No effusion, infiltrate, or pneumothorax. No acute osseous abnormality. IMPRESSION: No acute cardiopulmonary process. Electronically Signed   By: Suzy Bouchard M.D.   On: 06/04/2022 15:27     Assessment and Plan:   #NSTEMI: #Known Multivessel CAD s/p 4V CABG: Patient with history of CAD s/p CABG in [place] about 15 years ago. Was doing well until about 2 weeks ago when she developed worsening fatigue and cough. This morning, she woke up with chest pressure similar to her prior anginal equivalent. Was sent into ER by PCP. In the ER trop 253>574. ECG with no acute ischemic changes. Received nitro x3 with resolution of symptoms and is currently chest pain free. Plan for cath tomorrow. -Keep NPO for cath tomorrow -Check TTE -Continue heparin gtt -S/p ASA '324mg'$  in  ER, continue ASA '81mg'$  daily tomorrow -Continue crestor '20mg'$  daily -Continue zetia '10mg'$  daily -No BB due to active wheezing -Can add ARB/ACE as able post-cath  #History of CVA: Had CVA in 2021. Cardiac monitor without Afib. TTE with normal BiV function, no significant valve disease, small PFO (incidental finding). Declined loop at our last visit. -Continue ASA '81mg'$  daily tomorrow -Continue crestor '20mg'$  daily -Continue zetia '10mg'$  daily  #COPD/Asthma: On chronic O2 at home. Actively wheezing on exam today. -Management per primary  #HLD: -Continue crestor '20mg'$  daily -Continue zetia '10mg'$  daily  #Thrombocytosis: Chronic per review of labs.   Risk Assessment/Risk Scores:     TIMI Risk Score for Unstable Angina or Non-ST Elevation MI:   The patient's TIMI risk score is 6, which indicates a 41% risk of all cause mortality, new or recurrent myocardial infarction or need for urgent revascularization in the next 14 days.{           For questions or updates, please contact [place] Please consult [email or web address] for contact info under    Signed, Freada Bergeron, MD  06/04/2022 7:59 PM

## 2022-06-04 NOTE — Assessment & Plan Note (Addendum)
Substernal chest pressure since this morning reminiscent of when she had last heart attack, in setting of uncontrolled COPD. Advised needs further evaluation in ER setting. Will call EMS for expedited evaluation.  She refused EKG in office.  SL nitro 0.'4mg'$  in office today - without significant improvement in symptoms.

## 2022-06-04 NOTE — H&P (Signed)
PCP:   Ria Bush, MD   Chief Complaint:  Chest pain  HPI: This is a 82 year old female with past medical history of COPD, CAD prior h/o CABG, thrombocytosis, HTN, history of CVA.  Recently the patient has been coughing, and has had chest congestion. Today woke up with centrally located chest pain.  She took some aspirins, but the discomfort did not go away.  She scribes the pain as burning and heavy.  Patient pain was centrally located between her 2 tits. The patient had an appointment with her PCP today for her congestion, he queried about her chest pressure and sent her to the ER. Per patient she has had this discomfort before with her prior MI.  The pressure was the same, and was in the same location.  Her discomfort was worse the last time but because it was familiar she followed her PCPs direction and came to the ER.  She denies diaphoresis or palpitations.  In the ER she also developed shortness of breath.  In the ER patient's troponin is elevated to 253, then 574.  EKG without ischemic changes.  Chest pain currently 0/10.  Review of Systems:  The patient denies anorexia, fever, weight loss,, vision loss, decreased hearing, hoarseness, chest pain, syncope, dyspnea on exertion, peripheral edema, balance deficits, hemoptysis, abdominal pain, melena, hematochezia, severe indigestion/heartburn, hematuria, incontinence, genital sores, muscle weakness, suspicious skin lesions, transient blindness, difficulty walking, depression, unusual weight change, abnormal bleeding, enlarged lymph nodes, angioedema, and breast masses Positives: Chest pain, shortness of breath, coughing  Past Medical History: Past Medical History:  Diagnosis Date   Arthritis    Asthma    Cancer ([place]) 1990   COVID-19 06/14/2020   Depression    Past Surgical History:  Procedure Laterality Date   BREAST SURGERY Left 1990   CORONARY ARTERY BYPASS GRAFT     JOINT REPLACEMENT Right    Hip   OVARIAN CYST REMOVAL   1970   TUBAL LIGATION  1970    Medications: Prior to Admission medications   Medication Sig Start Date End Date Taking? Authorizing Provider  albuterol (PROVENTIL) (2.5 MG/3ML) 0.083% nebulizer solution INHALE 1 VIAL VIA NEBULIZER 3 TIMES A DAY AS NEEDED 12/25/21   Waunita Schooner, MD  albuterol (VENTOLIN HFA) 108 (90 Base) MCG/ACT inhaler Inhale 2 puffs into the lungs every 4 (four) hours as needed for wheezing or shortness of breath. 07/04/21   Waunita Schooner, MD  aspirin 325 MG tablet Take 2 tablets (650 mg total) by mouth 4 (four) times daily as needed for moderate pain. 06/04/22   Ria Bush, MD  BREO ELLIPTA [phone] MCG/ACT AEPB INHALE 1 PUFF BY MOUTH EVERY DAY 01/22/22   Waunita Schooner, MD  ibuprofen (ADVIL,MOTRIN) 200 MG tablet Take 200 mg by mouth every 6 (six) hours as needed for fever or headache.    [provider]  loratadine (CLARITIN) 10 MG tablet Take 10 mg by mouth daily.    [provider]  Multiple Vitamin (MULTIVITAMIN) tablet Take 1 tablet by mouth daily.    [provider]  Multiple Vitamins-Minerals (PRESERVISION AREDS 2) CAPS Take 2 each by mouth daily at 12 noon.    [provider]  Tiotropium Bromide Monohydrate (SPIRIVA RESPIMAT) 2.5 MCG/ACT AERS INHALE 2 PUFFS INTO THE LUNGS EVERY DAY 06/04/22   Ria Bush, MD  Zinc 50 MG CAPS Take 50 mg by mouth daily.    [provider]    Allergies:   Allergies  Allergen Reactions   Advair  Diskus [Fluticasone-Salmeterol] Shortness Of Breath    As of 09/01/2014. Pt can take Breo   Levofloxacin Other (See Comments)    Nausea, brain fog, depression, as of 12/12/14   Qvar [Beclomethasone] Other (See Comments)    Lungs closing-as of 05/03/2015   Singulair [Montelukast] Shortness Of Breath   Symbicort [Budesonide-Formoterol Fumarate] Other (See Comments)    Hard time breathing as of 10/31/2014.    Social History:  reports that she quit smoking about 22 years ago. Her smoking use  included cigarettes. She has a 40.00 pack-year smoking history. She has never used smokeless tobacco. She reports that she does not currently use alcohol. She reports that she does not use drugs.  Family History: Family History  Problem Relation Age of Onset   Cancer Father        lung   Heart attack Father 43   Alcohol abuse Father    Arthritis Father    COPD Father    Diabetes Father    Hypertension Father    Breast cancer Sister 74   Arthritis Sister    Cancer Sister    COPD Sister    Drug abuse Sister    Hypertension Sister    101 / [place] Sister    Thyroid disease Sister    Arthritis Mother    COPD Mother    Diabetes Mother    Hearing loss Mother    Hypertension Mother    24 / [place] Mother    Stroke Mother    Alcohol abuse Brother    Arthritis Brother    Hypertension Brother    Arthritis Daughter    Asthma Daughter    Depression Daughter    Hypertension Daughter    61 / [place] Daughter    Thyroid disease Daughter    Arthritis Maternal Grandmother    Hypertension Maternal Grandmother    Stroke Maternal Grandmother    Thyroid disease Maternal Grandmother    Alcohol abuse Maternal Grandfather    Arthritis Maternal Grandfather    Cancer Maternal Grandfather    Depression Maternal Grandfather    Hearing loss Maternal Grandfather    Hypertension Maternal Grandfather    Arthritis Sister    Thyroid disease Sister    Thyroid disease Daughter    Hypertension Daughter    Depression Daughter    COPD Daughter    Alcohol abuse Daughter    Arthritis Daughter    Arthritis Daughter    COPD Daughter    Depression Daughter    Hearing loss Daughter    Hypertension Daughter    Thyroid disease Daughter     Physical Exam: Vitals:   06/04/22 1757 06/04/22 1830 06/04/22 1900 06/04/22 2014  BP: (!) 159/79 (!) 168/73 (!) 171/89 (!) 179/81  Pulse: 69 66 77 81  Resp: 20 (!) '21 20 17  '$ Temp: 97.6 F (36.4 C)     SpO2: 95% 100%  95% 100%    General:  Alert and oriented times three, well developed and nourished, no acute distress.  Comfortable female Eyes: PERRLA, pink conjunctiva, no scleral icterus ENT: Moist oral mucosa, neck supple, no thyromegaly Lungs: clear to ascultation, no wheeze, no crackles, no use of accessory muscles Cardiovascular: regular rate and rhythm, no regurgitation, no gallops, no murmurs. No carotid bruits, no JVD.  No reproducible chest wall pain Abdomen: soft, positive BS, non-tender, non-distended, no organomegaly, not an acute abdomen GU: not examined Neuro: CN II - XII grossly intact, sensation intact Musculoskeletal: strength 5/5 all extremities, no  clubbing, cyanosis or edema Skin: no rash, no subcutaneous crepitation, no decubitus Psych: appropriate patient   Labs on Admission:  Recent Labs    06/04/22 1459  NA 141  K 4.3  CL 110  CO2 24  GLUCOSE 96  BUN 22  CREATININE 0.87  CALCIUM 10.0    Recent Labs    06/04/22 1459  WBC 10.4  HGB 12.2  HCT 38.1  MCV 92.3  PLT 1,377*    Radiological Exams on Admission: DG Chest 2 View  Result Date: 06/04/2022 CLINICAL DATA:  Chest pain EXAM: CHEST - 2 VIEW COMPARISON:  06/15/2020 FINDINGS: Sternotomy wires overlie normal cardiac silhouette. Normal pulmonary vasculature. No effusion, infiltrate, or pneumothorax. No acute osseous abnormality. IMPRESSION: No acute cardiopulmonary process. Electronically Signed   By: Suzy Bouchard M.D.   On: 06/04/2022 15:27    Assessment/Plan Present on Admission:  NSTEMI (non-ST elevated myocardial infarction)/ CAD (coronary artery disease) -Admit to cardiac telemetry -NSTEMI order set initiated -IV heparin initiated -Aspirin, Crestor initiated -Add Coreg mg p.o. twice daily.  Patient's current systolic blood pressure 836. -As needed blood pressure medication added -Oxygen as needed.  Nitro as needed  Thrombocytosis -Following with oncology -Continue hydroxyurea 500 mg  daily -Continue aspirin daily   Hypertension -Currently hypertension, Coreg increased to 12.5 mg twice daily   COPD (chronic obstructive pulmonary disease)/Asthma -Breo, Spiriva resumed.  As needed breathing treatments   Depression, recurrent ([place]) Chronic respiratory failure as needed H/o CVA  Daionna Crossland 06/04/2022, 8:22 PM

## 2022-06-04 NOTE — Assessment & Plan Note (Addendum)
She continues daily breo with PRN albuterol neb, however has not been taking spiriva as she ran out. Anticipate component of COPD exacerbation for several weeks now, however need to r/o ACS.

## 2022-06-04 NOTE — ED Triage Notes (Signed)
Pt BIB [facility] from PCP for CP that she feels mid sternal with no radiation. EMS gave 324 ASA & after 2nd Nitro the pressure in her chest did get better.  Does have Hx of HTN is NOT on any meds for it. Before Nitro she was 204/110 then afterwards it was 156/98. Does have Hx of COPD & has also had a deep cough for a few weeks. She endorses exertional SOB, 96% on RA, 80bpm, 12 L good, 20g Lt AC, A/Ox4.

## 2022-06-04 NOTE — Progress Notes (Signed)
ANTICOAGULATION CONSULT NOTE - Initial Consult  Pharmacy Consult for heparin Indication: chest pain/ACS  Allergies  Allergen Reactions   Advair Diskus [Fluticasone-Salmeterol] Shortness Of Breath    As of 09/01/2014. Pt can take Breo   Levofloxacin Other (See Comments)    Nausea, brain fog, depression, as of 12/12/14   Qvar [Beclomethasone] Other (See Comments)    Lungs closing-as of 05/03/2015   Singulair [Montelukast] Shortness Of Breath   Symbicort [Budesonide-Formoterol Fumarate] Other (See Comments)    Hard time breathing as of 10/31/2014.    Patient Measurements:   Heparin Dosing Weight: 68kg  Vital Signs: Temp: 97.6 F (36.4 C) (01/16 1757) Temp Source: Temporal (01/16 1253) BP: 171/89 (01/16 1900) Pulse Rate: 77 (01/16 1900)  Labs: Recent Labs    06/04/22 1459 06/04/22 1712  HGB 12.2  --   HCT 38.1  --   PLT 1,377*  --   CREATININE 0.87  --   TROPONINIHS 253* 574*    Estimated Creatinine Clearance: 47.9 mL/min (by C-G formula based on SCr of 0.87 mg/dL).   Medical History: Past Medical History:  Diagnosis Date   Arthritis    Asthma    Cancer ([place]) 1990   COVID-19 06/14/2020   Depression     Assessment: 37 YOF presenting with CP, elevated troponin, she is not on anticoagulation PTA  Goal of Therapy:  Heparin level 0.3-0.7 units/ml Monitor platelets by anticoagulation protocol: Yes   Plan:  Heparin 4000 units Iv x 1, and gtt at 800 units/hr F/u 8 hour heparin level F/u cards eval and recs  Bertis Ruddy, PharmD, [place] Pharmacist ED Pharmacist Phone # [phone] 06/04/2022 7:18 PM

## 2022-06-04 NOTE — ED Provider Notes (Addendum)
[facility] EMERGENCY DEPARTMENT Provider Note   CSN: [id number] Arrival date & time: 06/04/22  1425     History  Chief Complaint  Patient presents with   Chest Pain    Nancy Merritt is a 82 y.o. female.  HPI     82yo female with history of CAD (history of CABGx4 in [place]), COPD, CVA who presents with concern for chest pain beginning this morning.   Last saw Dr. Johney Frame in 2022.   Reports one month of cough, wheezing. Was going to PCP about that and then today developed central chest heaviness/pressure and discussed this with PCP who sent her to ED. No radiation.  Some associated dyspnea. No lightheadedness, nausea.  Notes some increased leg swelling bilaterally.  No fever. Has had cough, wheezing over last month that improves with her nebulizers.     Reports 15 years ago she had similar symptoms "that felt like bronchitis" prior to being diagnosed with a heart attack and having bypass.     Past Medical History:  Diagnosis Date   Arthritis    Asthma    Cancer ([place]) 1990   COVID-19 06/14/2020   Depression      Home Medications Prior to Admission medications   Medication Sig Start Date End Date Taking? Authorizing Provider  albuterol (PROVENTIL) (2.5 MG/3ML) 0.083% nebulizer solution INHALE 1 VIAL VIA NEBULIZER 3 TIMES A DAY AS NEEDED 12/25/21   Waunita Schooner, MD  albuterol (VENTOLIN HFA) 108 (90 Base) MCG/ACT inhaler Inhale 2 puffs into the lungs every 4 (four) hours as needed for wheezing or shortness of breath. 07/04/21   Waunita Schooner, MD  aspirin 325 MG tablet Take 2 tablets (650 mg total) by mouth 4 (four) times daily as needed for moderate pain. 06/04/22   Ria Bush, MD  BREO ELLIPTA [id number] MCG/ACT AEPB INHALE 1 PUFF BY MOUTH EVERY DAY 01/22/22   Waunita Schooner, MD  ibuprofen (ADVIL,MOTRIN) 200 MG tablet Take 200 mg by mouth every 6 (six) hours as needed for fever or headache.    [provider]  loratadine (CLARITIN) 10 MG tablet  Take 10 mg by mouth daily.    [provider]  Multiple Vitamin (MULTIVITAMIN) tablet Take 1 tablet by mouth daily.    [provider]  Multiple Vitamins-Minerals (PRESERVISION AREDS 2) CAPS Take 2 each by mouth daily at 12 noon.    [provider]  Tiotropium Bromide Monohydrate (SPIRIVA RESPIMAT) 2.5 MCG/ACT AERS INHALE 2 PUFFS INTO THE LUNGS EVERY DAY 06/04/22   Ria Bush, MD  Zinc 50 MG CAPS Take 50 mg by mouth daily.    [provider]      Allergies    Advair diskus [fluticasone-salmeterol], Levofloxacin, Qvar [beclomethasone], Singulair [montelukast], and Symbicort [budesonide-formoterol fumarate]    Review of Systems   Review of Systems  Physical Exam Updated Vital Signs BP (!) 154/78   Pulse (!) 56   Temp 97.8 F (36.6 C) (Oral)   Resp 18   SpO2 96%  Physical Exam Vitals and nursing note reviewed.  Constitutional:      General: She is not in acute distress.    Appearance: She is well-developed. She is not diaphoretic.  HENT:     Head: Normocephalic and atraumatic.  Eyes:     Conjunctiva/sclera: Conjunctivae normal.  Cardiovascular:     Rate and Rhythm: Normal rate and regular rhythm.     Heart sounds: Normal heart sounds. No murmur heard.    No friction rub. No  gallop.  Pulmonary:     Effort: Pulmonary effort is normal. No respiratory distress.     Breath sounds: Wheezing present. No rales.  Abdominal:     General: There is no distension.     Palpations: Abdomen is soft.     Tenderness: There is no abdominal tenderness. There is no guarding.  Musculoskeletal:        General: No tenderness.     Cervical back: Normal range of motion.     Right lower leg: Edema present.     Left lower leg: Edema present.  Skin:    General: Skin is warm and dry.     Findings: No erythema or rash.  Neurological:     Mental Status: She is alert and oriented to person, place, and time.     ED Results / Procedures / Treatments    Labs (all labs ordered are listed, but only abnormal results are displayed) Labs Reviewed  CBC - Abnormal; Notable for the following components:      Result Value   RDW 16.0 (*)    Platelets 1,377 (*)    All other components within normal limits  HEPARIN LEVEL (UNFRACTIONATED) - Abnormal; Notable for the following components:   Heparin Unfractionated <0.10 (*)    All other components within normal limits  CBC - Abnormal; Notable for the following components:   Hemoglobin 11.6 (*)    HCT 35.4 (*)    RDW 15.8 (*)    Platelets 1,173 (*)    All other components within normal limits  TROPONIN I (HIGH SENSITIVITY) - Abnormal; Notable for the following components:   Troponin I (High Sensitivity) 253 (*)    All other components within normal limits  TROPONIN I (HIGH SENSITIVITY) - Abnormal; Notable for the following components:   Troponin I (High Sensitivity) 574 (*)    All other components within normal limits  BASIC METABOLIC PANEL  BASIC METABOLIC PANEL  LIPID PANEL  MAGNESIUM  CBC  CREATININE, SERUM    EKG EKG Interpretation  Date/Time:  Tuesday June 04 2022 18:38:23 EST Ventricular Rate:  67 PR Interval:  174 QRS Duration: 73 QT Interval:  370 QTC Calculation: 391 R Axis:   46 Text Interpretation: Sinus rhythm Probable anteroseptal infarct, old Minimal ST elevation, inferior leads No significant change since last tracing Confirmed by Gareth Morgan [phone]) on 06/04/2022 8:18:06 PM  Radiology CARDIAC CATHETERIZATION  Result Date: 06/05/2022   Prox Cx to Mid Cx lesion is 100% stenosed.   Mid LAD lesion is 100% stenosed.   Prox LAD to Mid LAD lesion is 70% stenosed.   1st Diag lesion is 100% stenosed.   Mid RCA lesion is 100% stenosed.   Origin to Prox Graft lesion is 30% stenosed.   RPAV lesion is 50% stenosed.   Prox RCA lesion is 80% stenosed.   LIMA graft was visualized by non-selective angiography and is normal in caliber.   SVG graft was visualized by angiography  and is normal in caliber.   SVG graft was visualized by angiography.   The graft exhibits no disease.   The graft exhibits no disease.   The graft exhibits mild .   The left ventricular systolic function is normal.   LV end diastolic pressure is moderately elevated.   The left ventricular ejection fraction is 45-50% by visual estimate. 1.  Severe underlying native three-vessel coronary artery disease with patent grafts including LIMA to LAD, SVG to diagonal, SVG to OM and SVG to right  PDA. 2.  Mildly reduced LV systolic function with moderately elevated left ventricular end-diastolic pressure at 28 mmHg.  Could not exclude wall motion abnormalities. Recommendations: No clear culprit is identified for small myocardial infarction which could be due to small vessel disease versus stress-induced cardiomyopathy.  Obtain an echocardiogram.  Recommend blood pressure control and consider gentle diuresis.  I added clopidogrel to be used for 1 year and referred the patient to cardiac rehab.   DG Chest 2 View  Result Date: 06/04/2022 CLINICAL DATA:  Chest pain EXAM: CHEST - 2 VIEW COMPARISON:  06/15/2020 FINDINGS: Sternotomy wires overlie normal cardiac silhouette. Normal pulmonary vasculature. No effusion, infiltrate, or pneumothorax. No acute osseous abnormality. IMPRESSION: No acute cardiopulmonary process. Electronically Signed   By: Suzy Bouchard M.D.   On: 06/04/2022 15:27    Procedures .Critical Care  Performed by: Gareth Morgan, MD Authorized by: Gareth Morgan, MD   Critical care provider statement:    Critical care time (minutes):  30   Critical care was time spent personally by me on the following activities:  Development of treatment plan with patient or surrogate, discussions with consultants, evaluation of patient's response to treatment, examination of patient, ordering and review of laboratory studies, ordering and review of radiographic studies, ordering and performing treatments and  interventions, pulse oximetry, re-evaluation of patient's condition and review of old charts     Medications Ordered in ED Medications  sodium chloride flush (NS) 0.9 % injection 3 mL ( Intravenous Automatically Held 06/20/22 2200)  sodium chloride flush (NS) 0.9 % injection 3 mL (has no administration in time range)  0.9 %  sodium chloride infusion ( Intravenous New Bag/Given 06/05/22 0529)  rosuvastatin (CRESTOR) tablet 20 mg ( Oral Automatically Held 06/20/22 2200)  ezetimibe (ZETIA) tablet 10 mg ( Oral Automatically Held 06/13/22 1000)  nitroGLYCERIN (NITROSTAT) SL tablet 0.4 mg ( Sublingual MAR Hold 06/05/22 0939)  metoprolol tartrate (LOPRESSOR) injection 5 mg ( Intravenous MAR Hold 06/05/22 0939)  acetaminophen (TYLENOL) tablet 650 mg (650 mg Oral Given 06/05/22 1139)    Or  acetaminophen (TYLENOL) suppository 650 mg ( Rectal See Alternative 06/05/22 1139)  senna-docusate (Senokot-S) tablet 1 tablet ( Oral MAR Hold 06/05/22 0939)  aspirin tablet 325 mg ( Oral Automatically Held 06/13/22 1000)  carvedilol (COREG) tablet 12.5 mg ( Oral Automatically Held 06/13/22 1700)  aspirin chewable tablet 81 mg (has no administration in time range)  Heparin (Porcine) in NaCl 1000-0.9 UT/500ML-% SOLN (500 mLs  Given 06/05/22 0949)  midazolam (VERSED) injection (1 mg Intravenous Given 06/05/22 0953)  lidocaine (PF) (XYLOCAINE) 1 % injection (12 mLs Infiltration Given 06/05/22 0956)  iohexol (OMNIPAQUE) 350 MG/ML injection (110 mLs  Given 06/05/22 1037)  labetalol (NORMODYNE) injection (10 mg Intravenous Given 06/05/22 1039)  ondansetron (ZOFRAN) injection 4 mg (has no administration in time range)  enoxaparin (LOVENOX) injection 40 mg (has no administration in time range)  0.9 %  sodium chloride infusion ( Intravenous Restarted 06/05/22 1053)  sodium chloride flush (NS) 0.9 % injection 3 mL (has no administration in time range)  sodium chloride flush (NS) 0.9 % injection 3 mL (has no administration in time range)   0.9 %  sodium chloride infusion (has no administration in time range)  labetalol (NORMODYNE) injection 10 mg (has no administration in time range)  hydrALAZINE (APRESOLINE) injection 10 mg (has no administration in time range)  clopidogrel (PLAVIX) tablet 75 mg (has no administration in time range)  aspirin chewable tablet 324 mg (324 mg Oral Given  06/04/22 2006)  heparin bolus via infusion 4,000 Units (4,000 Units Intravenous Bolus from Bag 06/04/22 2008)  carvedilol (COREG) tablet 6.25 mg (6.25 mg Oral Given 06/05/22 0201)  morphine (PF) 2 MG/ML injection 1 mg (1 mg Intravenous Given 06/05/22 0206)  heparin bolus via infusion 2,000 Units (2,000 Units Intravenous Bolus from Bag 06/05/22 0758)    ED Course/ Medical Decision Making/ A&P                              82yo female with history of CAD (history of CABGx4 in [place]), COPD, thrombocytosis, CVA who presents with concern for chest pain beginning this morning.   Differential diagnosis for chest pain includes pulmonary embolus, dissection, pneumothorax, pneumonia, ACS, myocarditis, pericarditis.  EKG was done and evaluate by me and showed no acute ST changes and no signs of pericarditis.   Chest x-ray was done and evaluated by me and radiology and showed no sign of edema, pneumonia or pneumothorax. Do not feel history or exam are consistent with aortic dissection.  Does have wheezing and cough on exam which have been ongoing, suspect underlying COPD.  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   Troponin today is 253, repeat is 574.  Considered PE study however symptoms and troponin trend consistent with NSTEMI.  Ordered aspirin, heparin. CP has improved after nitroglycerin.  Consulted Cardiology, Dr. Johney Frame and discussed with hospitalist.        Final Clinical Impression(s) / ED Diagnoses Final diagnoses:  Non-ST elevation (NSTEMI)  myocardial infarction [facility])    Rx / DC Orders ED Discharge Orders          Ordered    AMB referral to Phase II Cardiac Rehabilitation        06/05/22 1042              Gareth Morgan, MD 06/05/22 0263    Gareth Morgan, MD 06/05/22 1209

## 2022-06-04 NOTE — ED Provider Triage Note (Signed)
Emergency Medicine Provider Triage Evaluation Note  Ginamarie Banfield , a 82 y.o. female  was evaluated in triage.  Pt complains of mid-sternal chest pressure that started this morning with slight pain to left chest. Took 2 full aspirin at home. Evaluated outpatient and given NTG both there and by EMS and now pain is mostly resolved. Denies shortness of breath, abd pain, nausea, vomiting, lightheadedness, dizziness, syncope, radiating chest pain, or diaphoresis. Has this pain very rarely but normally resolves on its own. Remote history of CABG x 4.   Review of Systems  Positive: See HPI Negative: See HPI  Physical Exam  BP (!) 150/67 (BP Location: Right Arm)   Pulse 74   Temp 98.4 F (36.9 C)   Resp 12   SpO2 95%  Gen:   Awake, no distress   Resp:  Normal effort LCTA MSK:   Moves extremities without difficulty  Other:  RRR, soft and nontender abdomen, no LE edema, no chest wall tenderness, pt sitting comfortably  Medical Decision Making  Medically screening exam initiated at 2:52 PM.  Appropriate orders placed.  Diany Formosa was informed that the remainder of the evaluation will be completed by another provider, this initial triage assessment does not replace that evaluation, and the importance of remaining in the ED until their evaluation is complete.     Suzzette Righter, PA-C 06/04/22 1454

## 2022-06-04 NOTE — H&P (View-Only) (Signed)
Cardiology Consultation   [name] MRN: [id number]; DOB: [birth year]  Admit date: 06/04/2022 Date of Consult: 06/04/2022  PCP:  Nancy Merritt, [place] Providers Cardiologist:  None     Patient Profile:   Nancy Merritt is a 82 y.o. female with a hx of CAD, s.p CABG 14 yrs ago, HTN, COPD, on 2 L [place] and COVID infection in 2022, asthma, depression and CVA. who is being seen 06/04/2022 for the evaluation of chest pain at the request of Dr. Billy Merritt .  History of Present Illness:   Nancy Merritt with above hx, and MI 14 yrs ago in [place] with s/p CABG 4V.  She had CVA in 2021 in [place].  No known arrhythmias.  Symptoms resolved.  She did wear a monitor to eval for a fib with hx of CVA and no atrial fib was seen.  TTE with EF 60-65%, G1DD, normal RV and systolic function, mild Aortic sclerosis, + PFO but unlikely to be source of CVA.   The patient states she was doing well until a couple of weeks ago when she developed a cough and worsening fatigue. Today, shortly after she woke up, she developed chest pressure. She was seen by her PCP where she had continued symptoms and was recommended to go to the ER.   In the ER, labs notable for trop 253>574. Plt 1377 (chronic). ECG with NSR, old septal infarct, no acute ischemic changes. CXR without acute process. She was given ASA, nitro and started on a heparin gtt. Currently, the patient is chest pain free and comfortable.    Past Medical History:  Diagnosis Date   Arthritis    Asthma    Cancer ([place]) 1990   COVID-19 06/14/2020   Depression     Past Surgical History:  Procedure Laterality Date   BREAST SURGERY Left 1990   CORONARY ARTERY BYPASS GRAFT     JOINT REPLACEMENT Right    Hip   OVARIAN CYST REMOVAL  1970   TUBAL LIGATION  1970     Home Medications:  Prior to Admission medications   Medication Sig Start Date End Date Taking? Authorizing Provider  albuterol (PROVENTIL)  (2.5 MG/3ML) 0.083% nebulizer solution INHALE 1 VIAL VIA NEBULIZER 3 TIMES A DAY AS NEEDED 12/25/21   Waunita Schooner, MD  albuterol (VENTOLIN HFA) 108 (90 Base) MCG/ACT inhaler Inhale 2 puffs into the lungs every 4 (four) hours as needed for wheezing or shortness of breath. 07/04/21   Waunita Schooner, MD  aspirin 325 MG tablet Take 2 tablets (650 mg total) by mouth 4 (four) times daily as needed for moderate pain. 06/04/22   Nancy Bush, MD  BREO ELLIPTA [phone] MCG/ACT AEPB INHALE 1 PUFF BY MOUTH EVERY DAY 01/22/22   Waunita Schooner, MD  ibuprofen (ADVIL,MOTRIN) 200 MG tablet Take 200 mg by mouth every 6 (six) hours as needed for fever or headache.    [provider]  loratadine (CLARITIN) 10 MG tablet Take 10 mg by mouth daily.    [provider]  Multiple Vitamin (MULTIVITAMIN) tablet Take 1 tablet by mouth daily.    [provider]  Multiple Vitamins-Minerals (PRESERVISION AREDS 2) CAPS Take 2 each by mouth daily at 12 noon.    [provider]  Tiotropium Bromide Monohydrate (SPIRIVA RESPIMAT) 2.5 MCG/ACT AERS INHALE 2 PUFFS INTO THE LUNGS EVERY DAY 06/04/22   Nancy Bush, MD  Zinc 50 MG CAPS Take 50 mg by mouth daily.  [provider]    Inpatient Medications: Scheduled Meds:  aspirin  324 mg Oral Once   heparin  4,000 Units Intravenous Once   sodium chloride flush  3 mL Intravenous Q12H   Continuous Infusions:  heparin     PRN Meds:   Allergies:    Allergies  Allergen Reactions   Advair Diskus [Fluticasone-Salmeterol] Shortness Of Breath    As of 09/01/2014. Pt can take Breo   Levofloxacin Other (See Comments)    Nausea, brain fog, depression, as of 12/12/14   Qvar [Beclomethasone] Other (See Comments)    Lungs closing-as of 05/03/2015   Singulair [Montelukast] Shortness Of Breath   Symbicort [Budesonide-Formoterol Fumarate] Other (See Comments)    Hard time breathing as of 10/31/2014.    Social History:   Social History    Socioeconomic History   Marital status: Widowed    Spouse name: Not on file   Number of children: 3   Years of education: 2 associate degrees   Highest education level: Not on file  Occupational History   Not on file  Tobacco Use   Smoking status: Former    Packs/day: 1.00    Years: 40.00    Total pack years: 40.00    Types: Cigarettes    Quit date: 2002    Years since quitting: 22.0   Smokeless tobacco: Never  Vaping Use   Vaping Use: Never used  Substance and Sexual Activity   Alcohol use: Not Currently   Drug use: Never   Sexual activity: Not Currently  Other Topics Concern   Not on file  Social History Narrative   Moved from [place] with Daughter, Nancy Merritt  (one daughter in [place] and the other in [place]) - 5 granddaughters and 2 great-grandchildren   Enjoys needle work, reading, cook   Exercise: not really, can walk 1/2 a mile at slow pace w/o getting SOB, SOB with stairs   Sleeps on the ground floor   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)   Social Determinants of Radio broadcast assistant Strain: [place]  (06/25/2021)   Overall Financial Resource Strain (CARDIA)    Difficulty of Paying Living Expenses: Not hard at all  Food Insecurity: No Food Insecurity (06/25/2021)   Hunger Vital Sign    Worried About Running Out of Food in the Last Year: Never true    [place] in the Last Year: Never true  Transportation Needs: No Transportation Needs (06/25/2021)   PRAPARE - Hydrologist (Medical): No    Lack of Transportation (Non-Medical): No  Physical Activity: Inactive (06/25/2021)   Exercise Vital Sign    Days of Exercise per Week: 0 days    Minutes of Exercise per Session: 0 min  Stress: No Stress Concern Present (06/25/2021)   [place]    Feeling of Stress : Not at all  Social Connections: Moderately Isolated (06/25/2021)   Social Connection and  Isolation Panel [NHANES]    Frequency of Communication with Friends and Family: More than three times a week    Frequency of Social Gatherings with Friends and Family: More than three times a week    Attends Religious Services: More than 4 times per year    Active Member of [employer] or Organizations: No    Attends Archivist Meetings: Never    Marital Status: Widowed  Intimate Partner Violence: Not At Risk (06/25/2021)  Humiliation, Afraid, Rape, and Kick questionnaire    Fear of Current or Ex-Partner: No    Emotionally Abused: No    Physically Abused: No    Sexually Abused: No    Family History:    Family History  Problem Relation Age of Onset   Cancer Father        lung   Heart attack Father 29   Alcohol abuse Father    Arthritis Father    COPD Father    Diabetes Father    Hypertension Father    Breast cancer Sister 34   Arthritis Sister    Cancer Sister    COPD Sister    Drug abuse Sister    Hypertension Sister    Miscarriages / [place] Sister    Thyroid disease Sister    Arthritis Mother    COPD Mother    Diabetes Mother    Hearing loss Mother    Hypertension Mother    Miscarriages / [place] Mother    Stroke Mother    Alcohol abuse Brother    Arthritis Brother    Hypertension Brother    Arthritis Daughter    Asthma Daughter    Depression Daughter    Hypertension Daughter    51 / [place] Daughter    Thyroid disease Daughter    Arthritis Maternal Grandmother    Hypertension Maternal Grandmother    Stroke Maternal Grandmother    Thyroid disease Maternal Grandmother    Alcohol abuse Maternal Grandfather    Arthritis Maternal Grandfather    Cancer Maternal Grandfather    Depression Maternal Grandfather    Hearing loss Maternal Grandfather    Hypertension Maternal Grandfather    Arthritis Sister    Thyroid disease Sister    Thyroid disease Daughter    Hypertension Daughter    Depression Daughter    COPD Daughter    Alcohol  abuse Daughter    Arthritis Daughter    Arthritis Daughter    COPD Daughter    Depression Daughter    Hearing loss Daughter    Hypertension Daughter    Thyroid disease Daughter      ROS:  Please see the history of present illness.  All other ROS reviewed and negative.     Physical Exam/Data:   Vitals:   06/04/22 1443 06/04/22 1757 06/04/22 1830 06/04/22 1900  BP: (!) 150/67 (!) 159/79 (!) 168/73 (!) 171/89  Pulse: 74 69 66 77  Resp: 12 20 (!) 21 20  Temp: 98.4 F (36.9 C) 97.6 F (36.4 C)    SpO2: 95% 95% 100% 95%   No intake or output data in the 24 hours ending 06/04/22 1959    06/04/2022   12:53 PM 01/31/2022   11:56 AM 10/11/2021    3:21 PM  Last 3 Weights  Weight (lbs) 168 lb 158 lb 8 oz 159 lb 6.4 oz  Weight (kg) 76.204 kg 71.895 kg 72.303 kg     There is no height or weight on file to calculate BMI.  General:  Well nourished, well developed, in no acute distress HEENT: normal Neck: no JVD Vascular: No carotid bruits Cardiac:  normal S1, S2; RRR; no murmur  Lungs:  Diffusely wheezing Abd: soft, nontender, no hepatomegaly  Ext: no edema Musculoskeletal:  No deformities, BUE and BLE strength normal and equal Skin: warm and dry  Neuro:  CNs 2-12 intact, no focal abnormalities noted Psych:  Normal affect   EKG:  The EKG was personally reviewed and demonstrates:  NSR, septal infarct, no acute ischemic changes Telemetry:  Telemetry was personally reviewed and demonstrates:  NSR  Relevant CV Studies: TTE 07/2020: IMPRESSIONS     1. Left ventricular ejection fraction, by estimation, is 60 to 65%. The  left ventricle has normal function. The left ventricle has no regional  wall motion abnormalities. Left ventricular diastolic parameters are  consistent with Grade I diastolic  dysfunction (impaired relaxation).   2. Right ventricular systolic function is normal. The right ventricular  size is normal. There is normal pulmonary artery systolic pressure. The   estimated right ventricular systolic pressure is 35.5 mmHg.   3. The mitral valve is grossly normal. Trivial mitral valve  regurgitation.   4. The aortic valve is tricuspid. Aortic valve regurgitation is not  visualized. Mild aortic valve sclerosis is present, with no evidence of  aortic valve stenosis.   5. The inferior vena cava is normal in size with greater than 50%  respiratory variability, suggesting right atrial pressure of 3 mmHg.   6. Evidence of possible atrial level shunting suggestive of a PFO - the  atrial septum is aneurysmal. Images do not visualize the left atrium well,  but significant bubbles are seen within 3-4 beats in the left ventricle  suggesting a modest atrial level  shunt. TEE with bubble study recommended for confirmation.   Comparison(s): Prior images unable to be directly viewed, comparison made  by report only. Changes from prior study are noted. 05/04/2020: LVEF 65%,  +PFO (outside hospital report).   Laboratory Data:  High Sensitivity Troponin:   Recent Labs  Lab 06/04/22 1459 06/04/22 1712  TROPONINIHS 253* 574*     Chemistry Recent Labs  Lab 06/04/22 1459  NA 141  K 4.3  CL 110  CO2 24  GLUCOSE 96  BUN 22  CREATININE 0.87  CALCIUM 10.0  GFRNONAA >60  ANIONGAP 7    No results for input(s): "PROT", "ALBUMIN", "AST", "ALT", "ALKPHOS", "BILITOT" in the last 168 hours. Lipids No results for input(s): "CHOL", "TRIG", "HDL", "LABVLDL", "LDLCALC", "CHOLHDL" in the last 168 hours.  Hematology Recent Labs  Lab 06/04/22 1459  WBC 10.4  RBC 4.13  HGB 12.2  HCT 38.1  MCV 92.3  MCH 29.5  MCHC 32.0  RDW 16.0*  PLT 1,377*   Thyroid No results for input(s): "TSH", "FREET4" in the last 168 hours.  BNPNo results for input(s): "BNP", "PROBNP" in the last 168 hours.  DDimer No results for input(s): "DDIMER" in the last 168 hours.   Radiology/Studies:  DG Chest 2 View  Result Date: 06/04/2022 CLINICAL DATA:  Chest pain EXAM: CHEST - 2  VIEW COMPARISON:  06/15/2020 FINDINGS: Sternotomy wires overlie normal cardiac silhouette. Normal pulmonary vasculature. No effusion, infiltrate, or pneumothorax. No acute osseous abnormality. IMPRESSION: No acute cardiopulmonary process. Electronically Signed   By: Suzy Bouchard M.D.   On: 06/04/2022 15:27     Assessment and Plan:   #NSTEMI: #Known Multivessel CAD s/p 4V CABG: Patient with history of CAD s/p CABG in [place] about 15 years ago. Was doing well until about 2 weeks ago when she developed worsening fatigue and cough. This morning, she woke up with chest pressure similar to her prior anginal equivalent. Was sent into ER by PCP. In the ER trop 253>574. ECG with no acute ischemic changes. Received nitro x3 with resolution of symptoms and is currently chest pain free. Plan for cath tomorrow. -Keep NPO for cath tomorrow -Check TTE -Continue heparin gtt -S/p ASA '324mg'$  in  ER, continue ASA '81mg'$  daily tomorrow -Continue crestor '20mg'$  daily -Continue zetia '10mg'$  daily -No BB due to active wheezing -Can add ARB/ACE as able post-cath  #History of CVA: Had CVA in 2021. Cardiac monitor without Afib. TTE with normal BiV function, no significant valve disease, small PFO (incidental finding). Declined loop at our last visit. -Continue ASA '81mg'$  daily tomorrow -Continue crestor '20mg'$  daily -Continue zetia '10mg'$  daily  #COPD/Asthma: On chronic O2 at home. Actively wheezing on exam today. -Management per primary  #HLD: -Continue crestor '20mg'$  daily -Continue zetia '10mg'$  daily  #Thrombocytosis: Chronic per review of labs.   Risk Assessment/Risk Scores:     TIMI Risk Score for Unstable Angina or Non-ST Elevation MI:   The patient's TIMI risk score is 6, which indicates a 41% risk of all cause mortality, new or recurrent myocardial infarction or need for urgent revascularization in the next 14 days.{           For questions or updates, please contact [place] Please consult [email or web address] for contact info under    Signed, Freada Bergeron, MD  06/04/2022 7:59 PM

## 2022-06-04 NOTE — Progress Notes (Signed)
[name], female    DOB: [birth year], 82 y.o.   MRN: [id number]  This visit was conducted in person.  BP (!) 156/80   Pulse 72   Temp 97.6 F (36.4 C) (Temporal)   Ht '5\' 3"'$  (1.6 m)   Wt 168 lb (76.2 kg)   SpO2 95%   BMI 29.76 kg/m    CC: chest discomfort with cough Subjective:   HPI: Nancy Merritt is a 82 y.o. female presenting on 06/04/2022 for Chest Pain (C/o chest pain and cough. Sxs started this AM. Also, requests refill for Spiriva. Pt accompanied by daughter, [name]. )   New patient to me.  Transfer of care from Dr Einar Pheasant to [place], asked to be seen acutely due to scheduling conflict up front.  She's been having productive cough for days with wheezing. No fevers. Out of spiriva.   Today started having substernal chest pressure, not exertional, that is ongoing since this morning. This feels similar to when she had her bypass surgery.   H/o quadruple bypass in [place] 15 yrs ago.  Sees Dr Johney Frame, last seen 08/2020.   She takes 2 aspirin '325mg'$  up to every 4 hours as needed for pain. Last dosed at 8am.  She is not on statin.      Relevant past medical, surgical, family and social history reviewed and updated as indicated. Interim medical history since our last visit reviewed. Allergies and medications reviewed and updated. Outpatient Medications Prior to Visit  Medication Sig Dispense Refill   albuterol (PROVENTIL) (2.5 MG/3ML) 0.083% nebulizer solution INHALE 1 VIAL VIA NEBULIZER 3 TIMES A DAY AS NEEDED 75 mL 2   albuterol (VENTOLIN HFA) 108 (90 Base) MCG/ACT inhaler Inhale 2 puffs into the lungs every 4 (four) hours as needed for wheezing or shortness of breath. 6.7 g 3   BREO ELLIPTA 200-25 MCG/ACT AEPB INHALE 1 PUFF BY MOUTH EVERY DAY 180 each 1   ibuprofen (ADVIL,MOTRIN) 200 MG tablet Take 200 mg by mouth every 6 (six) hours as needed for fever or headache.     loratadine (CLARITIN) 10 MG tablet Take 10 mg by mouth daily.     Multiple Vitamin  (MULTIVITAMIN) tablet Take 1 tablet by mouth daily.     Multiple Vitamins-Minerals (PRESERVISION AREDS 2) CAPS Take 2 each by mouth daily at 12 noon.     Zinc 50 MG CAPS Take 50 mg by mouth daily.     aspirin 325 MG tablet Take 325 mg by mouth daily.     Tiotropium Bromide Monohydrate (SPIRIVA RESPIMAT) 2.5 MCG/ACT AERS INHALE 2 PUFFS INTO THE LUNGS EVERY DAY 12 g 2   Calcium Carbonate-Vit D-Min (CALCIUM 1200 PO) Take 1,200 mg by mouth daily at 12 noon.     FLAXSEED, LINSEED, PO Take by mouth.     No facility-administered medications prior to visit.     Per HPI unless specifically indicated in ROS section below Review of Systems  Objective:  BP (!) 156/80   Pulse 72   Temp 97.6 F (36.4 C) (Temporal)   Ht '5\' 3"'$  (1.6 m)   Wt 168 lb (76.2 kg)   SpO2 95%   BMI 29.76 kg/m   Wt Readings from Last 3 Encounters:  06/04/22 168 lb (76.2 kg)  01/31/22 158 lb 8 oz (71.9 kg)  10/11/21 159 lb 6.4 oz (72.3 kg)      Physical Exam Vitals and nursing note reviewed.  Constitutional:      Appearance: Normal appearance.  She is not ill-appearing.  Cardiovascular:     Rate and Rhythm: Normal rate and regular rhythm.     Pulses: Normal pulses.     Heart sounds: Normal heart sounds. No murmur heard. Pulmonary:     Effort: Pulmonary effort is normal. No respiratory distress.     Breath sounds: Wheezing (coarse throughout) and rhonchi present. No rales.     Comments: Cough present Neurological:     Mental Status: She is alert.       Results for orders placed or performed in visit on 02/05/22  Potassium  Result Value Ref Range   Potassium 4.4 3.5 - 5.1 mEq/L  PTH, Intact and Calcium  Result Value Ref Range   PTH 119 (H) 16 - 77 pg/mL   Calcium 10.9 (H) 8.6 - 10.4 mg/dL    Assessment & Plan:   Problem List Items Addressed This Visit     CAD (coronary artery disease)    H/o quadruple bypass remotely in [place].  Last saw Dr Johney Frame 08/2020.  Not on statin. Taking aspiring '650mg'$   QID PRN arthritis pain.  Will need expedited evaluation of current chest pressure.       Relevant Medications   aspirin 325 MG tablet   COPD (chronic obstructive pulmonary disease) ([place])    She continues daily breo with PRN albuterol neb, however has not been taking spiriva as she ran out. Anticipate component of COPD exacerbation for several weeks now, however need to r/o ACS.       Relevant Medications   Tiotropium Bromide Monohydrate (SPIRIVA RESPIMAT) 2.5 MCG/ACT AERS   History of CVA (cerebrovascular accident) without residual deficits   Substernal chest pain - Primary    Substernal chest pressure since this morning reminiscent of when she had last heart attack, in setting of uncontrolled COPD. Advised needs further evaluation in ER setting. Will call EMS for expedited evaluation.  She refused EKG in office.  SL nitro 0.'4mg'$  in office today - without significant improvement in symptoms.         Meds ordered this encounter  Medications   Tiotropium Bromide Monohydrate (SPIRIVA RESPIMAT) 2.5 MCG/ACT AERS    Sig: INHALE 2 PUFFS INTO THE LUNGS EVERY DAY    Dispense:  12 g    Refill:  2   nitroGLYCERIN (NITROSTAT) SL tablet 0.4 mg   aspirin 325 MG tablet    Sig: Take 2 tablets (650 mg total) by mouth 4 (four) times daily as needed for moderate pain.    No orders of the defined types were placed in this encounter.   Patient Instructions  I think you need to go to the ER for further evaluation today.  Go via EMS. We will await their evaluation.  Follow up plan: Return if symptoms worsen or fail to improve.  Ria Bush, MD

## 2022-06-05 ENCOUNTER — Other Ambulatory Visit (HOSPITAL_COMMUNITY): Payer: PPO

## 2022-06-05 ENCOUNTER — Inpatient Hospital Stay (HOSPITAL_COMMUNITY)
Admission: EM | Disposition: A | Discharge status: Home / Self Care | Payer: Self-pay | Source: Home / Self Care | Attending: Internal Medicine

## 2022-06-05 DIAGNOSIS — I5041 Acute combined systolic (congestive) and diastolic (congestive) heart failure: Secondary | ICD-10-CM

## 2022-06-05 DIAGNOSIS — I214 Non-ST elevation (NSTEMI) myocardial infarction: Secondary | ICD-10-CM | POA: Diagnosis not present

## 2022-06-05 DIAGNOSIS — I5042 Chronic combined systolic (congestive) and diastolic (congestive) heart failure: Secondary | ICD-10-CM

## 2022-06-05 DIAGNOSIS — I161 Hypertensive emergency: Secondary | ICD-10-CM | POA: Diagnosis not present

## 2022-06-05 DIAGNOSIS — I2510 Atherosclerotic heart disease of native coronary artery without angina pectoris: Secondary | ICD-10-CM

## 2022-06-05 HISTORY — PX: LEFT HEART CATH AND CORS/GRAFTS ANGIOGRAPHY: CATH118250

## 2022-06-05 LAB — HEPARIN LEVEL (UNFRACTIONATED): Heparin Unfractionated: <0.1 IU/mL — ABNORMAL LOW (ref 0.30–0.70)

## 2022-06-05 LAB — CBC
HCT: 35.4 % — ABNORMAL LOW (ref 36.0–46.0)
Hemoglobin: 11.6 g/dL — ABNORMAL LOW (ref 12.0–15.0)
MCH: 29.7 pg (ref 26.0–34.0)
MCHC: 32.8 g/dL (ref 30.0–36.0)
MCV: 90.8 fL (ref 80.0–100.0)
Platelets: 1173 10*3/uL (ref 150–400)
RBC: 3.9 MIL/uL (ref 3.87–5.11)
RDW: 15.8 % — ABNORMAL HIGH (ref 11.5–15.5)
WBC: 8 10*3/uL (ref 4.0–10.5)
nRBC: 0 % (ref 0.0–0.2)

## 2022-06-05 LAB — BASIC METABOLIC PANEL
Anion gap: 7 (ref 5–15)
BUN: 16 mg/dL (ref 8–23)
CO2: 24 mmol/L (ref 22–32)
Calcium: 9.3 mg/dL (ref 8.9–10.3)
Chloride: 109 mmol/L (ref 98–111)
Creatinine, Ser: 0.77 mg/dL (ref 0.44–1.00)
GFR, Estimated: >60 mL/min (ref >60)
Glucose, Bld: 96 mg/dL (ref 70–99)
Potassium: 3.8 mmol/L (ref 3.5–5.1)
Sodium: 140 mmol/L (ref 135–145)

## 2022-06-05 SURGERY — LEFT HEART CATH AND CORS/GRAFTS ANGIOGRAPHY
Anesthesia: LOCAL

## 2022-06-05 MED ORDER — ACETAMINOPHEN 325 MG PO TABS
ORAL_TABLET | ORAL | Status: AC
  Filled 2022-06-05: qty 2

## 2022-06-05 MED ORDER — IOHEXOL 350 MG/ML SOLN
INTRAVENOUS | Status: DC | PRN
  Administered 2022-06-05: 110 mL

## 2022-06-05 MED ORDER — FUROSEMIDE 10 MG/ML IJ SOLN
40.0000 mg | Freq: Once | INTRAMUSCULAR | Status: AC
  Administered 2022-06-05: 40 mg via INTRAVENOUS
  Filled 2022-06-05: qty 4

## 2022-06-05 MED ORDER — MORPHINE SULFATE (PF) 2 MG/ML IV SOLN
1.0000 mg | Freq: Once | INTRAVENOUS | Status: AC
  Administered 2022-06-05: 1 mg via INTRAVENOUS
  Filled 2022-06-05: qty 1

## 2022-06-05 MED ORDER — ENOXAPARIN SODIUM 40 MG/0.4ML IJ SOSY
40.0000 mg | PREFILLED_SYRINGE | INTRAMUSCULAR | Status: DC
  Administered 2022-06-06: 40 mg via SUBCUTANEOUS
  Filled 2022-06-05: qty 0.4

## 2022-06-05 MED ORDER — HEPARIN (PORCINE) IN NACL 1000-0.9 UT/500ML-% IV SOLN
INTRAVENOUS | Status: DC | PRN
  Administered 2022-06-05 (×2): 500 mL

## 2022-06-05 MED ORDER — TIOTROPIUM BROMIDE MONOHYDRATE 18 MCG IN CAPS: 18.0000 ug | ORAL_CAPSULE | Freq: Every day | RESPIRATORY_TRACT | Status: DC

## 2022-06-05 MED ORDER — MIDAZOLAM HCL 2 MG/2ML IJ SOLN
INTRAMUSCULAR | Status: AC
  Filled 2022-06-05: qty 2

## 2022-06-05 MED ORDER — LABETALOL HCL 5 MG/ML IV SOLN: 10.0000 mg | INTRAVENOUS | Status: AC | PRN

## 2022-06-05 MED ORDER — SPIRONOLACTONE 25 MG PO TABS
25.0000 mg | ORAL_TABLET | Freq: Every day | ORAL | Status: DC
  Administered 2022-06-05 – 2022-06-06 (×2): 25 mg via ORAL
  Filled 2022-06-05 (×2): qty 1

## 2022-06-05 MED ORDER — CARVEDILOL 3.125 MG PO TABS
6.2500 mg | ORAL_TABLET | Freq: Once | ORAL | Status: AC
  Administered 2022-06-05: 6.25 mg via ORAL
  Filled 2022-06-05: qty 2

## 2022-06-05 MED ORDER — SACUBITRIL-VALSARTAN 24-26 MG PO TABS
1.0000 | ORAL_TABLET | Freq: Two times a day (BID) | ORAL | Status: DC
  Administered 2022-06-05 – 2022-06-06 (×3): 1 via ORAL
  Filled 2022-06-05 (×3): qty 1

## 2022-06-05 MED ORDER — SODIUM CHLORIDE 0.9% FLUSH: 3.0000 mL | INTRAVENOUS | Status: DC | PRN

## 2022-06-05 MED ORDER — UMECLIDINIUM BROMIDE 62.5 MCG/ACT IN AEPB
1.0000 | INHALATION_SPRAY | Freq: Every day | RESPIRATORY_TRACT | Status: DC
  Administered 2022-06-06: 1 via RESPIRATORY_TRACT
  Filled 2022-06-05: qty 7

## 2022-06-05 MED ORDER — SODIUM CHLORIDE 0.9 % IV SOLN: INTRAVENOUS | Status: AC

## 2022-06-05 MED ORDER — ASPIRIN 81 MG PO CHEW: 81.0000 mg | CHEWABLE_TABLET | ORAL | Status: DC

## 2022-06-05 MED ORDER — LIDOCAINE HCL (PF) 1 % IJ SOLN
INTRAMUSCULAR | Status: DC | PRN
  Administered 2022-06-05: 12 mL

## 2022-06-05 MED ORDER — LABETALOL HCL 5 MG/ML IV SOLN
INTRAVENOUS | Status: DC | PRN
  Administered 2022-06-05: 10 mg via INTRAVENOUS

## 2022-06-05 MED ORDER — LABETALOL HCL 5 MG/ML IV SOLN
INTRAVENOUS | Status: AC
  Filled 2022-06-05: qty 4

## 2022-06-05 MED ORDER — CARVEDILOL 12.5 MG PO TABS
12.5000 mg | ORAL_TABLET | Freq: Two times a day (BID) | ORAL | Status: DC
  Administered 2022-06-05 – 2022-06-06 (×3): 12.5 mg via ORAL
  Filled 2022-06-05 (×4): qty 1

## 2022-06-05 MED ORDER — SODIUM CHLORIDE 0.9% FLUSH
3.0000 mL | Freq: Two times a day (BID) | INTRAVENOUS | Status: DC
  Administered 2022-06-05 – 2022-06-06 (×2): 3 mL via INTRAVENOUS

## 2022-06-05 MED ORDER — HEPARIN SODIUM (PORCINE) 1000 UNIT/ML IJ SOLN
INTRAMUSCULAR | Status: AC
  Filled 2022-06-05: qty 10

## 2022-06-05 MED ORDER — HEPARIN BOLUS VIA INFUSION
2000.0000 [IU] | Freq: Once | INTRAVENOUS | Status: AC
  Administered 2022-06-05: 2000 [IU] via INTRAVENOUS
  Filled 2022-06-05: qty 2000

## 2022-06-05 MED ORDER — FLUTICASONE FUROATE-VILANTEROL 200-25 MCG/ACT IN AEPB
1.0000 | INHALATION_SPRAY | Freq: Every day | RESPIRATORY_TRACT | Status: DC
  Administered 2022-06-06: 1 via RESPIRATORY_TRACT
  Filled 2022-06-05: qty 28

## 2022-06-05 MED ORDER — VERAPAMIL HCL 2.5 MG/ML IV SOLN
INTRAVENOUS | Status: AC
  Filled 2022-06-05: qty 2

## 2022-06-05 MED ORDER — SODIUM CHLORIDE 0.9 % IV SOLN: 250.0000 mL | INTRAVENOUS | Status: DC | PRN

## 2022-06-05 MED ORDER — HEPARIN (PORCINE) IN NACL 1000-0.9 UT/500ML-% IV SOLN
INTRAVENOUS | Status: AC
  Filled 2022-06-05: qty 1000

## 2022-06-05 MED ORDER — CLOPIDOGREL BISULFATE 75 MG PO TABS
75.0000 mg | ORAL_TABLET | Freq: Every day | ORAL | Status: DC
  Administered 2022-06-06: 75 mg via ORAL
  Filled 2022-06-05: qty 1

## 2022-06-05 MED ORDER — LIDOCAINE HCL (PF) 1 % IJ SOLN
INTRAMUSCULAR | Status: AC
  Filled 2022-06-05: qty 30

## 2022-06-05 MED ORDER — ASPIRIN 81 MG PO CHEW
81.0000 mg | CHEWABLE_TABLET | Freq: Every day | ORAL | Status: DC
  Administered 2022-06-05 – 2022-06-06 (×2): 81 mg via ORAL
  Filled 2022-06-05 (×2): qty 1

## 2022-06-05 MED ORDER — HYDRALAZINE HCL 20 MG/ML IJ SOLN: 10.0000 mg | INTRAMUSCULAR | Status: AC | PRN

## 2022-06-05 MED ORDER — SODIUM CHLORIDE 0.9 % IV SOLN: INTRAVENOUS | Status: DC

## 2022-06-05 MED ORDER — MIDAZOLAM HCL 2 MG/2ML IJ SOLN
INTRAMUSCULAR | Status: DC | PRN
  Administered 2022-06-05: 1 mg via INTRAVENOUS

## 2022-06-05 MED ORDER — ONDANSETRON HCL 4 MG/2ML IJ SOLN: 4.0000 mg | Freq: Four times a day (QID) | INTRAMUSCULAR | Status: DC | PRN

## 2022-06-05 MED ORDER — ASPIRIN 81 MG PO CHEW: 81.0000 mg | CHEWABLE_TABLET | Freq: Once | ORAL | Status: DC

## 2022-06-05 SURGICAL SUPPLY — 14 items
CATH INFINITI 5FR MULTPACK ANG (CATHETERS) IMPLANT
GLIDESHEATH SLEND SS 6F .021 (SHEATH) IMPLANT
GUIDEWIRE INQWIRE 1.5J.035X260 (WIRE) IMPLANT
INQWIRE 1.5J .035X260CM (WIRE)
KIT HEART LEFT (KITS) ×1 IMPLANT
KIT MICROPUNCTURE NIT STIFF (SHEATH) IMPLANT
PACK CARDIAC CATHETERIZATION (CUSTOM PROCEDURE TRAY) ×1 IMPLANT
SHEATH PINNACLE 5F 10CM (SHEATH) IMPLANT
SHEATH PROBE COVER 6X72 (BAG) IMPLANT
TRANSDUCER W/STOPCOCK (MISCELLANEOUS) ×1 IMPLANT
TUBING CIL FLEX 10 FLL-RA (TUBING) ×1 IMPLANT
WIRE EMERALD 3MM-J .035X150CM (WIRE) IMPLANT
WIRE EMERALD 3MM-J .035X260CM (WIRE) IMPLANT
WIRE HI TORQ VERSACORE-J 145CM (WIRE) IMPLANT

## 2022-06-05 NOTE — Progress Notes (Signed)
Rounding Note    Patient Name: Nancy Merritt Date of Encounter: 06/05/2022  [place] Cardiologist: Freada Bergeron, MD   Subjective   Denies any chest pain, last episode of chest pain was last night. Had some SOB last night too, it has resolved this morning.   Inpatient Medications    Scheduled Meds:  aspirin  325 mg Oral Daily   carvedilol  12.5 mg Oral BID WC   ezetimibe  10 mg Oral Daily   rosuvastatin  20 mg Oral QHS   sodium chloride flush  3 mL Intravenous Q12H   Continuous Infusions:  sodium chloride 50 mL/hr at 06/05/22 0529   heparin 1,000 Units/hr (06/05/22 0758)   PRN Meds: acetaminophen **OR** acetaminophen, metoprolol tartrate, nitroGLYCERIN, senna-docusate, sodium chloride flush   Vital Signs    Vitals:   06/05/22 0615 06/05/22 0700 06/05/22 0745 06/05/22 0800  BP: 135/77 (!) 142/80 129/73   Pulse: 67 62 (!) 57   Resp: (!) 25 19 (!) 28   Temp:    97.8 F (36.6 C)  TempSrc:    Oral  SpO2: 93% 95% 93%    No intake or output data in the 24 hours ending 06/05/22 0834    06/04/2022   12:53 PM 01/31/2022   11:56 AM 10/11/2021    3:21 PM  Last 3 Weights  Weight (lbs) 168 lb 158 lb 8 oz 159 lb 6.4 oz  Weight (kg) 76.204 kg 71.895 kg 72.303 kg      Telemetry    NSR without significant ventricular ectopy - Personally Reviewed  ECG    NSR without significant ST-T wave changes - Personally Reviewed  Physical Exam   GEN: No acute distress.   Neck: No JVD Cardiac: RRR, no murmurs, rubs, or gallops.  Respiratory: Clear to auscultation bilaterally. GI: Soft, nontender, non-distended  MS: No edema; No deformity. Neuro:  Nonfocal  Psych: Normal affect   Labs    High Sensitivity Troponin:   Recent Labs  Lab 06/04/22 1459 06/04/22 1712  TROPONINIHS 253* 574*     Chemistry Recent Labs  Lab 06/04/22 1459 06/05/22 0546  NA 141 140  K 4.3 3.8  CL 110 109  CO2 24 24  GLUCOSE 96 96  BUN 22 16  CREATININE 0.87 0.77   CALCIUM 10.0 9.3  GFRNONAA >60 >60  ANIONGAP 7 7    Lipids No results for input(s): "CHOL", "TRIG", "HDL", "LABVLDL", "LDLCALC", "CHOLHDL" in the last 168 hours.  Hematology Recent Labs  Lab 06/04/22 1459 06/05/22 0546  WBC 10.4 8.0  RBC 4.13 3.90  HGB 12.2 11.6*  HCT 38.1 35.4*  MCV 92.3 90.8  MCH 29.5 29.7  MCHC 32.0 32.8  RDW 16.0* 15.8*  PLT 1,377* 1,173*   Thyroid No results for input(s): "TSH", "FREET4" in the last 168 hours.  BNPNo results for input(s): "BNP", "PROBNP" in the last 168 hours.  DDimer No results for input(s): "DDIMER" in the last 168 hours.   Radiology    DG Chest 2 View  Result Date: 06/04/2022 CLINICAL DATA:  Chest pain EXAM: CHEST - 2 VIEW COMPARISON:  06/15/2020 FINDINGS: Sternotomy wires overlie normal cardiac silhouette. Normal pulmonary vasculature. No effusion, infiltrate, or pneumothorax. No acute osseous abnormality. IMPRESSION: No acute cardiopulmonary process. Electronically Signed   By: Suzy Bouchard M.D.   On: 06/04/2022 15:27    Cardiac Studies   N/A  Patient Profile     82 y.o. 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 Alen Blew (on hydroxyurea and ASA, felt to have low risk for thrombosis given her specific mutation), CAD s/p 4v CABG 15 yrs ago, HTN, COPD on 2L [place] and COVID infection in 2022, asthma/depression and CVA who presented with chest pain and elevated troponin.   Assessment & Plan    NSTEMI  - serial trop 253 --> 574  - plan to proceed with cardiac catheterization today.   - Risk and benefit of procedure explained to the patient who display clear understanding and agree to proceed.  Discussed with patient possible procedural risk include bleeding, vascular injury, renal injury, arrythmia, MI, stroke and loss of limb or life.  CAD s/p 4v CABG: history of CAD s/p CABG in [place] about 15 years ago   H/o CVA 2021: previous heart monitor showed no afib.   COPD/asthma: on home O2, was wheezing  yesterday, but lung clear today  HLD: on crestor and zetia  Thrombocytosis: on hydroxyurea and ASA, followed by Dr. Alen Blew as outpatient, felt to have low risk for thrombosis given her specific mutation      For questions or updates, please contact [place] Please consult [email or web address] for contact info under        Signed, Almyra Deforest, [place]  06/05/2022, 8:34 AM

## 2022-06-05 NOTE — Progress Notes (Signed)
[facility] Progress Note  Patient: Nancy Merritt     GBT:[id number]  DOA: 06/04/2022   PCP: Ria Bush, MD       Brief hospital course: This is an 82 year old female with COPD, coronary artery disease status post CABG, CVA, hypertension, thrombocytosis who presents to the hospital with chest pain which she described as burning and heavy.  She felt that the pain was similar to her prior MI. Troponin noted to be 253 and then 574.  EKG was nonacute.  Subjective:  Evaluated after cardiac cath.  She has no concerns but did say that her daughter was concerned that she heard her wheezing.  Assessment and Plan: Principal Problem:   NSTEMI (non-ST elevated myocardial infarction) -hypertensive emergency -Cardiac cath reveals severe coronary artery disease unchanged from her previous cath and also is suspicious for volume overload -Troponin elevation may have been secondary to hypertensive emergency - Cardiology adjusting medications and patient will not be leaving the hospital until cardiology has cleared her  Active Problems:  COPD - No wheezing noted on my exam and no respiratory distress - Resume Breo Ellipta and Spiriva         Code Status: Full Code Consultants: Cardiology Level of Care: Level of care: Telemetry Cardiac Total time on patient care: 35 min DVT prophylaxis:  enoxaparin (LOVENOX) injection 40 mg Start: 06/06/22 0800 SCDs Start: 06/04/22 2113     Objective:   Vitals:   06/05/22 1452 06/05/22 1540 06/05/22 1640 06/05/22 1743  BP: (!) 155/67 136/78 (!) 133/59 116/62  Pulse: (!) 59 60 (!) 44 (!) 55  Resp: '20 18 18 18  '$ Temp:      TempSrc:      SpO2: 97% 92% 96% 95%  Weight:      Height:       Filed Weights   06/05/22 1351  Weight: 76.2 kg   Exam: General exam: Appears comfortable  HEENT: oral mucosa moist Respiratory system: Clear to auscultation.  Cardiovascular system: S1 & S2 heard  Gastrointestinal system: Abdomen soft, non-tender,  nondistended. Normal bowel sounds   Extremities: No cyanosis, clubbing or edema Psychiatry:  Mood & affect appropriate.      CBC: Recent Labs  Lab 06/04/22 1459 06/05/22 0546  WBC 10.4 8.0  HGB 12.2 11.6*  HCT 38.1 35.4*  MCV 92.3 90.8  PLT 1,377* 7,106*   Basic Metabolic Panel: Recent Labs  Lab 06/04/22 1459 06/05/22 0546  NA 141 140  K 4.3 3.8  CL 110 109  CO2 24 24  GLUCOSE 96 96  BUN 22 16  CREATININE 0.87 0.77  CALCIUM 10.0 9.3   GFR: Estimated Creatinine Clearance: 52.1 mL/min (by C-G formula based on SCr of 0.77 mg/dL).  Scheduled Meds:  aspirin  81 mg Oral Daily   carvedilol  12.5 mg Oral BID WC   [START ON 06/06/2022] clopidogrel  75 mg Oral Q breakfast   [START ON 06/06/2022] enoxaparin (LOVENOX) injection  40 mg Subcutaneous Q24H   ezetimibe  10 mg Oral Daily   fluticasone furoate-vilanterol  1 puff Inhalation Daily   rosuvastatin  20 mg Oral QHS   sacubitril-valsartan  1 tablet Oral BID   sodium chloride flush  3 mL Intravenous Q12H   sodium chloride flush  3 mL Intravenous Q12H   spironolactone  25 mg Oral Daily   umeclidinium bromide  1 puff Inhalation Daily   Continuous Infusions:  sodium chloride     Imaging and lab data was personally reviewed CARDIAC CATHETERIZATION  Result Date: 06/05/2022   Prox Cx to Mid Cx lesion is 100% stenosed.   Mid LAD lesion is 100% stenosed.   Prox LAD to Mid LAD lesion is 70% stenosed.   1st Diag lesion is 100% stenosed.   Mid RCA lesion is 100% stenosed.   Origin to Prox Graft lesion is 30% stenosed.   RPAV lesion is 50% stenosed.   Prox RCA lesion is 80% stenosed.   LIMA graft was visualized by non-selective angiography and is normal in caliber.   SVG graft was visualized by angiography and is normal in caliber.   SVG graft was visualized by angiography.   The graft exhibits no disease.   The graft exhibits no disease.   The graft exhibits mild .   The left ventricular systolic function is normal.   LV end diastolic  pressure is moderately elevated.   The left ventricular ejection fraction is 45-50% by visual estimate. 1.  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 2.  Mildly reduced LV systolic function with moderately elevated left ventricular end-diastolic pressure at 28 mmHg.  Could not exclude wall motion abnormalities. Recommendations: No clear culprit is identified for small myocardial infarction which could be due to small vessel disease versus stress-induced cardiomyopathy.  Obtain an echocardiogram.  Recommend blood pressure control and consider gentle diuresis.  I added clopidogrel to be used for 1 year and referred the patient to cardiac rehab.   DG Chest 2 View  Result Date: 06/04/2022 CLINICAL DATA:  Chest pain EXAM: CHEST - 2 VIEW COMPARISON:  06/15/2020 FINDINGS: Sternotomy wires overlie normal cardiac silhouette. Normal pulmonary vasculature. No effusion, infiltrate, or pneumothorax. No acute osseous abnormality. IMPRESSION: No acute cardiopulmonary process. Electronically Signed   By: Suzy Bouchard M.D.   On: 06/04/2022 15:27    LOS: 1 day   Author: Debbe Odea  06/05/2022 6:06 PM  To contact [facility]>   Check the care team in [facility] and look for the attending/consulting [place] provider listed  Log into [email or web address] and use [place]'s universal password   Go to> "[facility]"  and find provider  If you still have difficulty reaching the provider, please page the [facility] (Director on Call) for the Hospitalists listed on amion

## 2022-06-05 NOTE — Progress Notes (Signed)
Patient transferred from cath lab at 1335hrs. Right femoral site level zero.  Oriented to room and reviewed post-cath instructions.  Patient verbalized understanding.

## 2022-06-05 NOTE — Progress Notes (Signed)
ANTICOAGULATION CONSULT NOTE - Initial Consult  Pharmacy Consult for heparin Indication: chest pain/ACS  Allergies  Allergen Reactions   Advair Diskus [Fluticasone-Salmeterol] Shortness Of Breath    As of 09/01/2014. Pt can take Breo   Levofloxacin Other (See Comments)    Nausea, brain fog, depression, as of 12/12/14   Qvar [Beclomethasone] Other (See Comments)    Lungs closing-as of 05/03/2015   Singulair [Montelukast] Shortness Of Breath   Symbicort [Budesonide-Formoterol Fumarate] Other (See Comments)    Hard time breathing as of 10/31/2014.    Patient Measurements:   Heparin Dosing Weight: 68kg  Vital Signs: Temp: 98.4 F (36.9 C) (01/17 0530) Temp Source: Oral (01/17 0530) BP: 135/77 (01/17 0615) Pulse Rate: 67 (01/17 0615)  Labs: Recent Labs    06/04/22 1459 06/04/22 1712 06/05/22 0546  HGB 12.2  --  11.6*  HCT 38.1  --  35.4*  PLT 1,377*  --  1,173*  HEPARINUNFRC  --   --  <0.10*  CREATININE 0.87  --  0.77  TROPONINIHS 253* 574*  --     Estimated Creatinine Clearance: 52.1 mL/min (by C-G formula based on SCr of 0.77 mg/dL).   Medical History: Past Medical History:  Diagnosis Date   Arthritis    Asthma    Cancer ([place]) 1990   COVID-19 06/14/2020   Depression     Assessment: 48 YOF presenting with CP, elevated troponin, she is not on anticoagulation PTA. Pharmacy consulted to dose heparin.  Heparin level undetectable on 800 units/hr. CBC stable with no signs of bleeding.  Goal of Therapy:  Heparin level 0.3-0.7 units/ml Monitor platelets by anticoagulation protocol: Yes   Plan:  Heparin 2000 units IV once Increase heparin to 1000 units/hr Check 8hr heparin level at 1500 Daily CBC and heparin level Monitor for signs/symptoms of bleeding F/u cath and cardiology recommendations  Erskine Speed, PharmD Clinical Pharmacist 06/05/2022 6:56 AM

## 2022-06-05 NOTE — Progress Notes (Signed)
Sheath removal 5 french sheath removed from rt groin. Manual pressure held for 20 min, VVS stable throughout, level 0, dressing applied clean dry and intact.  Bed rest 4 hrs [place]

## 2022-06-05 NOTE — ED Notes (Signed)
Pt transported to Cath Lab

## 2022-06-05 NOTE — Progress Notes (Signed)
Nurse requests echo to be done at another time due to patient having trouble breathing.

## 2022-06-05 NOTE — Interval H&P Note (Signed)
History and Physical Interval Note:  06/05/2022 9:51 AM  Nancy Merritt  has presented today for surgery, with the diagnosis of NSTEMI.  The various methods of treatment have been discussed with the patient and family. After consideration of risks, benefits and other options for treatment, the patient has consented to  Procedure(s): LEFT HEART CATH AND CORS/GRAFTS ANGIOGRAPHY (N/A) as a surgical intervention.  The patient's history has been reviewed, patient examined, no change in status, stable for surgery.  I have reviewed the patient's chart and labs.  Questions were answered to the patient's satisfaction.     Kathlyn [place]

## 2022-06-06 ENCOUNTER — Encounter (HOSPITAL_COMMUNITY): Payer: Self-pay | Admitting: Cardiovascular Disease

## 2022-06-06 ENCOUNTER — Inpatient Hospital Stay (HOSPITAL_COMMUNITY): Payer: PPO

## 2022-06-06 DIAGNOSIS — I214 Non-ST elevation (NSTEMI) myocardial infarction: Secondary | ICD-10-CM

## 2022-06-06 DIAGNOSIS — I161 Hypertensive emergency: Secondary | ICD-10-CM | POA: Diagnosis not present

## 2022-06-06 LAB — BASIC METABOLIC PANEL
Anion gap: 7 (ref 5–15)
BUN: 20 mg/dL (ref 8–23)
CO2: 27 mmol/L (ref 22–32)
Calcium: 10.2 mg/dL (ref 8.9–10.3)
Chloride: 105 mmol/L (ref 98–111)
Creatinine, Ser: 0.93 mg/dL (ref 0.44–1.00)
GFR, Estimated: >60 mL/min (ref >60)
Glucose, Bld: 114 mg/dL — ABNORMAL HIGH (ref 70–99)
Potassium: 3.9 mmol/L (ref 3.5–5.1)
Sodium: 139 mmol/L (ref 135–145)

## 2022-06-06 LAB — ECHOCARDIOGRAM COMPLETE
Area-P 1/2: 1.81 cm2
Calc EF: 65.7 %
Height: 63 in
S' Lateral: 2.3 cm
Single Plane A2C EF: 65.9 %
Single Plane A4C EF: 64.9 %
Weight: 2687.85 oz

## 2022-06-06 MED ORDER — EZETIMIBE 10 MG PO TABS: 10.0000 mg | ORAL_TABLET | Freq: Every day | ORAL | 3 refills | Status: DC

## 2022-06-06 MED ORDER — CARVEDILOL 6.25 MG PO TABS: 6.2500 mg | ORAL_TABLET | Freq: Two times a day (BID) | ORAL | 11 refills | Status: DC

## 2022-06-06 MED ORDER — ASPIRIN 81 MG PO CHEW: 81.0000 mg | CHEWABLE_TABLET | Freq: Every day | ORAL | Status: AC

## 2022-06-06 MED ORDER — ROSUVASTATIN CALCIUM 20 MG PO TABS: 20.0000 mg | ORAL_TABLET | Freq: Every day | ORAL | 3 refills | Status: AC

## 2022-06-06 MED ORDER — CARVEDILOL 6.25 MG PO TABS
6.2500 mg | ORAL_TABLET | Freq: Two times a day (BID) | ORAL | Status: DC
  Administered 2022-06-06: 6.25 mg via ORAL
  Filled 2022-06-06: qty 1

## 2022-06-06 MED ORDER — CLOPIDOGREL BISULFATE 75 MG PO TABS: 75.0000 mg | ORAL_TABLET | Freq: Every day | ORAL | 1 refills | Status: DC

## 2022-06-06 MED ORDER — SACUBITRIL-VALSARTAN 24-26 MG PO TABS: 1.0000 | ORAL_TABLET | Freq: Two times a day (BID) | ORAL | 11 refills | Status: DC

## 2022-06-06 MED ORDER — NITROGLYCERIN 0.4 MG SL SUBL: 0.4000 mg | SUBLINGUAL_TABLET | SUBLINGUAL | 3 refills | Status: AC | PRN

## 2022-06-06 MED FILL — Verapamil HCl IV Soln 2.5 MG/ML: INTRAVENOUS | Qty: 2 | Status: AC

## 2022-06-06 NOTE — Progress Notes (Signed)
Rounding Note    Patient Name: Nancy Merritt Date of Encounter: 06/06/2022  [place] Cardiologist: Freada Bergeron, MD   Subjective   Denies any chest pain, last episode of chest pain was last night. Had some SOB last night too, it has resolved this morning.   Inpatient Medications    Scheduled Meds:  aspirin  81 mg Oral Daily   carvedilol  12.5 mg Oral BID WC   clopidogrel  75 mg Oral Q breakfast   enoxaparin (LOVENOX) injection  40 mg Subcutaneous Q24H   ezetimibe  10 mg Oral Daily   fluticasone furoate-vilanterol  1 puff Inhalation Daily   rosuvastatin  20 mg Oral QHS   sacubitril-valsartan  1 tablet Oral BID   sodium chloride flush  3 mL Intravenous Q12H   sodium chloride flush  3 mL Intravenous Q12H   spironolactone  25 mg Oral Daily   umeclidinium bromide  1 puff Inhalation Daily   Continuous Infusions:  sodium chloride     PRN Meds: sodium chloride, acetaminophen **OR** acetaminophen, nitroGLYCERIN, ondansetron (ZOFRAN) IV, senna-docusate, sodium chloride flush   Vital Signs    Vitals:   06/05/22 1743 06/05/22 1959 06/06/22 0011 06/06/22 0428  BP: 116/62 109/66 (!) 134/58 118/74  Pulse: (!) 55 64 62 69  Resp: '18 18 19 20  '$ Temp:  (!) 97.5 F (36.4 C) 98.4 F (36.9 C) 97.7 F (36.5 C)  TempSrc:  Oral Oral Oral  SpO2: 95% 96% 92% 93%  Weight:      Height:        Intake/Output Summary (Last 24 hours) at 06/06/2022 0818 Last data filed at 06/05/2022 1854 Gross per 24 hour  Intake 1053.35 ml  Output 600 ml  Net 453.35 ml      06/05/2022    1:51 PM 06/04/2022   12:53 PM 01/31/2022   11:56 AM  Last 3 Weights  Weight (lbs) 167 lb 15.9 oz 168 lb 158 lb 8 oz  Weight (kg) 76.2 kg 76.204 kg 71.895 kg      Telemetry    NSR without significant ventricular ectopy - Personally Reviewed  ECG    NSR without significant ST-T wave changes - Personally Reviewed  Physical Exam   GEN: No acute distress.   Neck: No JVD Cardiac: RRR, no  murmurs, rubs, or gallops.  Respiratory: Clear to auscultation bilaterally. GI: Soft, nontender, non-distended  MS: No edema; No deformity. Neuro:  Nonfocal  Psych: Normal affect   Labs    High Sensitivity Troponin:   Recent Labs  Lab 06/04/22 1459 06/04/22 1712  TROPONINIHS 253* 574*     Chemistry Recent Labs  Lab 06/04/22 1459 06/05/22 0546  NA 141 140  K 4.3 3.8  CL 110 109  CO2 24 24  GLUCOSE 96 96  BUN 22 16  CREATININE 0.87 0.77  CALCIUM 10.0 9.3  GFRNONAA >60 >60  ANIONGAP 7 7    Lipids No results for input(s): "CHOL", "TRIG", "HDL", "LABVLDL", "LDLCALC", "CHOLHDL" in the last 168 hours.  Hematology Recent Labs  Lab 06/04/22 1459 06/05/22 0546  WBC 10.4 8.0  RBC 4.13 3.90  HGB 12.2 11.6*  HCT 38.1 35.4*  MCV 92.3 90.8  MCH 29.5 29.7  MCHC 32.0 32.8  RDW 16.0* 15.8*  PLT 1,377* 1,173*   Thyroid No results for input(s): "TSH", "FREET4" in the last 168 hours.  BNPNo results for input(s): "BNP", "PROBNP" in the last 168 hours.  DDimer No results for input(s): "DDIMER" in  the last 168 hours.   Radiology    CARDIAC CATHETERIZATION  Result Date: 06/05/2022   Prox Cx to Mid Cx lesion is 100% stenosed.   Mid LAD lesion is 100% stenosed.   Prox LAD to Mid LAD lesion is 70% stenosed.   1st Diag lesion is 100% stenosed.   Mid RCA lesion is 100% stenosed.   Origin to Prox Graft lesion is 30% stenosed.   RPAV lesion is 50% stenosed.   Prox RCA lesion is 80% stenosed.   LIMA graft was visualized by non-selective angiography and is normal in caliber.   SVG graft was visualized by angiography and is normal in caliber.   SVG graft was visualized by angiography.   The graft exhibits no disease.   The graft exhibits no disease.   The graft exhibits mild .   The left ventricular systolic function is normal.   LV end diastolic pressure is moderately elevated.   The left ventricular ejection fraction is 45-50% by visual estimate. 1.  Severe underlying native three-vessel  coronary artery disease with patent grafts including LIMA to LAD, SVG to diagonal, SVG to OM and SVG to right PDA. 2.  Mildly reduced LV systolic function with moderately elevated left ventricular end-diastolic pressure at 28 mmHg.  Could not exclude wall motion abnormalities. Recommendations: No clear culprit is identified for small myocardial infarction which could be due to small vessel disease versus stress-induced cardiomyopathy.  Obtain an echocardiogram.  Recommend blood pressure control and consider gentle diuresis.  I added clopidogrel to be used for 1 year and referred the patient to cardiac rehab.   DG Chest 2 View  Result Date: 06/04/2022 CLINICAL DATA:  Chest pain EXAM: CHEST - 2 VIEW COMPARISON:  06/15/2020 FINDINGS: Sternotomy wires overlie normal cardiac silhouette. Normal pulmonary vasculature. No effusion, infiltrate, or pneumothorax. No acute osseous abnormality. IMPRESSION: No acute cardiopulmonary process. Electronically Signed   By: Suzy Bouchard M.D.   On: 06/04/2022 15:27    Cardiac Studies   Cath 06/05/2022   Prox Cx to Mid Cx lesion is 100% stenosed.   Mid LAD lesion is 100% stenosed.   Prox LAD to Mid LAD lesion is 70% stenosed.   1st Diag lesion is 100% stenosed.   Mid RCA lesion is 100% stenosed.   Origin to Prox Graft lesion is 30% stenosed.   RPAV lesion is 50% stenosed.   Prox RCA lesion is 80% stenosed.   LIMA graft was visualized by non-selective angiography and is normal in caliber.   SVG graft was visualized by angiography and is normal in caliber.   SVG graft was visualized by angiography.   The graft exhibits no disease.   The graft exhibits no disease.   The graft exhibits mild .   The left ventricular systolic function is normal.   LV end diastolic pressure is moderately elevated.   The left ventricular ejection fraction is 45-50% by visual estimate.   1.  Severe underlying native three-vessel coronary artery disease with patent grafts including  LIMA to LAD, SVG to diagonal, SVG to OM and SVG to right PDA. 2.  Mildly reduced LV systolic function with moderately elevated left ventricular end-diastolic pressure at 28 mmHg.  Could not exclude wall motion abnormalities.   Recommendations: No clear culprit is identified for small myocardial infarction which could be due to small vessel disease versus stress-induced cardiomyopathy.  Obtain an echocardiogram.  Recommend blood pressure control and consider gentle diuresis.  I added clopidogrel to be used  for 1 year and referred the patient to cardiac rehab.  Patient Profile     82 y.o. 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   Assessment & Plan    NSTEMI type II  - serial trop 253 --> 574, cardiac cath revealed patent grafts, elevation of troponin likely demand ischemia in the setting of acute CHF and hypertensive emergency  - cath 06/05/2022 severe 3v disease with patent grafts LIMA-LAD, SVG-Diagonal, SVG-OM, and SVG-RPDA. EF by LV gram was 45-50%. LVEDP moderately elevated  - started on spironolactone and entresto. On coreg. Given 1 dose of IV lasix. Pending echocardiogram and morning blood work, if stable, patient likely can be discharged today. Appt made with APP on 2/9 and with Dr. Johney Frame on 4/17.  Uncontrolled HTN: off of all BP meds. Started on coreg, entresto and spironolactone added 1/17.   CAD s/p 4v CABG: history of CAD s/p CABG in [place] about 15 years ago   H/o CVA 2021: previous heart monitor showed no afib.   COPD/asthma: on home O2, was wheezing yesterday, but lung clear today  HLD: on crestor and zetia  Thrombocytosis: on hydroxyurea and ASA, followed by Dr. Alen Blew as outpatient, felt to  have low risk for thrombosis given her specific mutation      For questions or updates, please contact [place] Please consult [email or web address] for contact info under        Signed, Almyra Deforest, [place]  06/06/2022, 8:18 AM

## 2022-06-06 NOTE — Progress Notes (Signed)
Echocardiogram 2D Echocardiogram has been performed.  Nancy Merritt 06/06/2022, 10:18 AM

## 2022-06-06 NOTE — Progress Notes (Signed)
CARDIAC REHAB PHASE I   PRE:  Rate/Rhythm: 63 NSR  BP:  Sitting: 127/63      SaO2: 97 RA  MODE:  Ambulation: 240 ft   AD:  RW  POST:  Rate/Rhythm: 68 NSR  BP:  Sitting: 103/59      SaO2: 94 RA  Pt amb with standby assistance, pt denies CP and SOB during amb and was returned to room w/o complaint.   Pt walked well.  Pt was educated on NSTEMI,  wt restrictions, no baths/daily wash-ups, s/s of infection, ex guidelines (progressive walking and resistance exercise), s/s to stop exercising, NTG use and calling 911, heart healthy diet, and CRPII. Pt received materials on exercise, diet, and CRPII. Will refer to [facility].   Pt was previously active walking on the TM multiple times throughout the week but has stopped since October. Pt wants to be more active and plans to resume using TM and begin light activity around the house. Pt was encouraged to use work:rest interval when using the TM to avoid fatiguing too fast.  Christen Bame 11:41 AM 06/06/2022    Service time is from 1050 to 1140.

## 2022-06-06 NOTE — Discharge Summary (Signed)
Physician Discharge Summary  Nancy Merritt NTI:[id number] DOB: 03-15-1941 DOA: 06/04/2022  PCP: Ria Bush, MD  Admit date: 06/04/2022 Discharge date: 06/06/2022 Discharging to: home Recommendations for Outpatient Follow-up:  She will require close cardiology follow-up  Consults:  Cardiology Procedures:  Cardiac cath    Discharge Diagnoses:   Principal Problem:   NSTEMI (non-ST elevated myocardial infarction) [facility]) Active Problems:   Hypertensive emergency   Acute combined systolic and diastolic heart failure (HCC)   Depression, recurrent ([place])   Hx of breast cancer   CAD (coronary artery disease)   COPD (chronic obstructive pulmonary disease) ([place])   History of CVA (cerebrovascular accident) without residual deficits   Thrombocytosis, unspecified     his is an 82 year old female with COPD, coronary artery disease status post CABG, CVA, hypertension, thrombocytosis who presents to the hospital with chest pain which she described as burning and heavy.  She felt that the pain was similar to her prior MI. Troponin noted to be 253 and then 574.  EKG was nonacute.    Assessment and Plan: Principal Problem:   NSTEMI (non-ST elevated myocardial infarction) due to hypertensive emergency -Cardiac cath reveals severe coronary artery disease unchanged from her previous cath and also is suspicious for volume overload -Troponin elevation may have been secondary to hypertensive emergency - Cardiology has initiated  Carvedilol, Entresto and Spironolactone- Spironolactone stopped today anc Coreg dose reduced due to hypotension  - BP now> 128/62, 124/60, 134/62 while sitting up on side of bed- checked by myself - ambulated in the hall 240 ft today - ASA 325 mg changed to ASA 81 and plavix   Active Problems:  COPD - No wheezing noted on my exam and no respiratory distress - Resume Breo Ellipta and Spiriva    Discharge Instructions  Discharge Instructions     Amb Referral to  Cardiac Rehabilitation   Complete by: As directed    Diagnosis: NSTEMI   After initial evaluation and assessments completed: Virtual Based Care may be provided alone or in conjunction with Phase 2 Cardiac Rehab based on patient barriers.: Yes   Intensive Cardiac Rehabilitation (ICR) [facility] location only OR Traditional Cardiac Rehabilitation (TCR) *If criteria for ICR are not met will enroll in TCR [facility] only): Yes   Diet - low sodium heart healthy   Complete by: As directed    Increase activity slowly   Complete by: As directed       Allergies as of 06/06/2022       Reactions   Advair Diskus [fluticasone-salmeterol] Shortness Of Breath   As of 09/01/2014. Pt can take Breo   Levofloxacin Other (See Comments)   Nausea, brain fog, depression, as of 12/12/14   Qvar [beclomethasone] Other (See Comments)   Lungs closing-as of 05/03/2015   Singulair [montelukast] Shortness Of Breath   Symbicort [budesonide-formoterol Fumarate] Other (See Comments)   Hard time breathing as of 10/31/2014.        Medication List     STOP taking these medications    aspirin 325 MG tablet Replaced by: aspirin 81 MG chewable tablet   ibuprofen 200 MG tablet Commonly known as: ADVIL       TAKE these medications    albuterol 108 (90 Base) MCG/ACT inhaler Commonly known as: VENTOLIN HFA Inhale 2 puffs into the lungs every 4 (four) hours as needed for wheezing or shortness of breath. What changed: Another medication with the same name was changed. Make sure you understand how and when to take each.  albuterol (2.5 MG/3ML) 0.083% nebulizer solution Commonly known as: PROVENTIL INHALE 1 VIAL VIA NEBULIZER 3 TIMES A DAY AS NEEDED What changed: See the new instructions.   aspirin 81 MG chewable tablet Chew 1 tablet (81 mg total) by mouth daily. Start taking on: June 07, 2022 Replaces: aspirin 325 MG tablet   Breo Ellipta 200-25 MCG/ACT Aepb Generic drug: fluticasone furoate-vilanterol INHALE 1  PUFF BY MOUTH EVERY DAY What changed: See the new instructions.   carvedilol 6.25 MG tablet Commonly known as: COREG Take 1 tablet (6.25 mg total) by mouth 2 (two) times daily with a meal.   clopidogrel 75 MG tablet Commonly known as: PLAVIX Take 1 tablet (75 mg total) by mouth daily with breakfast. Start taking on: June 07, 2022   ezetimibe 10 MG tablet Commonly known as: ZETIA Take 1 tablet (10 mg total) by mouth daily. Start taking on: June 07, 2022   loratadine 10 MG tablet Commonly known as: CLARITIN Take 10 mg by mouth daily.   multivitamin tablet Take 1 tablet by mouth daily.   nitroGLYCERIN 0.4 MG SL tablet Commonly known as: NITROSTAT Place 1 tablet (0.4 mg total) under the tongue every 5 (five) minutes as needed for chest pain.   PreserVision AREDS 2 Caps Take 2 each by mouth daily at 12 noon.   rosuvastatin 20 MG tablet Commonly known as: CRESTOR Take 1 tablet (20 mg total) by mouth at bedtime.   sacubitril-valsartan 24-26 MG Commonly known as: ENTRESTO Take 1 tablet by mouth 2 (two) times daily.   Spiriva Respimat 2.5 MCG/ACT Aers Generic drug: Tiotropium Bromide Monohydrate INHALE 2 PUFFS INTO THE LUNGS EVERY DAY What changed:  how much to take how to take this when to take this additional instructions   Zinc 50 MG Caps Take 50 mg by mouth daily.        Follow-up Information     Elgie Collard, PA-C Follow up on 06/28/2022.   Specialty: Cardiology Why: 3:10PM. Post hospital follow up with Dr. Jacolyn Reedy PA in 3 weeks Contact information: [address] [place] [address] [phone]         Freada Bergeron, MD Follow up on 09/04/2022.   Specialties: Cardiology, Radiology Why: '@3'$ :00PM. 3 month follow up with Dr. Burnice Logan information: [address] [place] [address] [phone]                    The results of significant diagnostics from this hospitalization  (including imaging, microbiology, ancillary and laboratory) are listed below for reference.    ECHOCARDIOGRAM COMPLETE  Result Date: 06/06/2022    ECHOCARDIOGRAM REPORT   Patient Name:   Nancy Merritt Date of Exam: 06/06/2022 Medical Rec #:  [id number]        Height:       63.0 in Accession #:    [id number]       Weight:       168.0 lb Date of Birth:  01/16/1941        BSA:          1.795 m Patient Age:    27 years         BP:           118/74 mmHg Patient Gender: F                HR:           58 bpm. Exam Location:  Inpatient Procedure:  2D Echo, Cardiac Doppler and Color Doppler Indications:    NSTEMI I21.4  History:        Patient has prior history of Echocardiogram examinations, most                 recent 08/09/2020.  Sonographer:    Bernadene Person RDCS Referring Phys: Isaiah Serge IMPRESSIONS  1. Left ventricular ejection fraction, by estimation, is 60 to 65%. The left ventricle has normal function. The left ventricle has no regional wall motion abnormalities. There is mild concentric left ventricular hypertrophy. Left ventricular diastolic parameters are consistent with Grade I diastolic dysfunction (impaired relaxation).  2. Right ventricular systolic function is normal. The right ventricular size is normal. Tricuspid regurgitation signal is inadequate for assessing PA pressure.  3. No evidence of mitral valve regurgitation.  4. There is moderate calcification of the aortic valve. Aortic valve regurgitation is not visualized.  5. The inferior vena cava is normal in size with greater than 50% respiratory variability, suggesting right atrial pressure of 3 mmHg. Comparison(s): No significant change from prior study. FINDINGS  Left Ventricle: Left ventricular ejection fraction, by estimation, is 60 to 65%. The left ventricle has normal function. The left ventricle has no regional wall motion abnormalities. The left ventricular internal cavity size was normal in size. There is  mild concentric left  ventricular hypertrophy. Left ventricular diastolic parameters are consistent with Grade I diastolic dysfunction (impaired relaxation). Right Ventricle: The right ventricular size is normal. Right ventricular systolic function is normal. Tricuspid regurgitation signal is inadequate for assessing PA pressure. Left Atrium: Left atrial size was normal in size. Right Atrium: Right atrial size was normal in size. Pericardium: There is no evidence of pericardial effusion. Mitral Valve: No evidence of mitral valve regurgitation. Tricuspid Valve: Tricuspid valve regurgitation is not demonstrated. Aortic Valve: There is moderate calcification of the aortic valve. Aortic valve regurgitation is not visualized. Pulmonic Valve: Pulmonic valve regurgitation is not visualized. Aorta: The aortic root and ascending aorta are structurally normal, with no evidence of dilitation. Venous: The inferior vena cava is normal in size with greater than 50% respiratory variability, suggesting right atrial pressure of 3 mmHg. IAS/Shunts: The interatrial septum was not well visualized.  LEFT VENTRICLE PLAX 2D LVIDd:         3.50 cm     Diastology LVIDs:         2.30 cm     LV e' medial:    4.95 cm/s LV PW:         0.90 cm     LV E/e' medial:  14.3 LV IVS:        1.00 cm     LV e' lateral:   5.78 cm/s LVOT diam:     2.00 cm     LV E/e' lateral: 12.2 LV SV:         86 LV SV Index:   48 LVOT Area:     3.14 cm  LV Volumes (MOD) LV vol d, MOD A2C: 62.2 ml LV vol d, MOD A4C: 57.2 ml LV vol s, MOD A2C: 21.2 ml LV vol s, MOD A4C: 20.1 ml LV SV MOD A2C:     41.0 ml LV SV MOD A4C:     57.2 ml LV SV MOD BP:      39.9 ml RIGHT VENTRICLE RV S prime:     8.63 cm/s TAPSE (M-mode): 1.3 cm LEFT ATRIUM  Index        RIGHT ATRIUM           Index LA diam:        3.20 cm 1.78 cm/m   RA Area:     12.70 cm LA Vol (A2C):   37.2 ml 20.72 ml/m  RA Volume:   26.30 ml  14.65 ml/m LA Vol (A4C):   37.9 ml 21.11 ml/m LA Biplane Vol: 38.0 ml 21.16 ml/m   AORTIC VALVE             PULMONIC VALVE LVOT Vmax:   123.00 cm/s PR End Diast Vel: 6.55 msec LVOT Vmean:  84.300 cm/s LVOT VTI:    0.275 m  AORTA Ao Root diam: 3.10 cm Ao Asc diam:  3.70 cm MITRAL VALVE MV Area (PHT): 1.81 cm    SHUNTS MV Decel Time: 419 msec    Systemic VTI:  0.28 m MV E velocity: 70.70 cm/s  Systemic Diam: 2.00 cm MV A velocity: 91.70 cm/s MV E/A ratio:  0.77 Mary Scientist, physiological signed by Phineas Inches Signature Date/Time: 06/06/2022/10:33:04 AM    Final    CARDIAC CATHETERIZATION  Result Date: 06/05/2022   Prox Cx to Mid Cx lesion is 100% stenosed.   Mid LAD lesion is 100% stenosed.   Prox LAD to Mid LAD lesion is 70% stenosed.   1st Diag lesion is 100% stenosed.   Mid RCA lesion is 100% stenosed.   Origin to Prox Graft lesion is 30% stenosed.   RPAV lesion is 50% stenosed.   Prox RCA lesion is 80% stenosed.   LIMA graft was visualized by non-selective angiography and is normal in caliber.   SVG graft was visualized by angiography and is normal in caliber.   SVG graft was visualized by angiography.   The graft exhibits no disease.   The graft exhibits no disease.   The graft exhibits mild .   The left ventricular systolic function is normal.   LV end diastolic pressure is moderately elevated.   The left ventricular ejection fraction is 45-50% by visual estimate. 1.  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 2.  Mildly reduced LV systolic function with moderately elevated left ventricular end-diastolic pressure at 28 mmHg.  Could not exclude wall motion abnormalities. Recommendations: No clear culprit is identified for small myocardial infarction which could be due to small vessel disease versus stress-induced cardiomyopathy.  Obtain an echocardiogram.  Recommend blood pressure control and consider gentle diuresis.  I added clopidogrel to be used for 1 year and referred the  patient to cardiac rehab.   DG Chest 2 View  Result Date: 06/04/2022 CLINICAL DATA:  Chest pain EXAM: CHEST - 2 VIEW COMPARISON:  06/15/2020 FINDINGS: Sternotomy wires overlie normal cardiac silhouette. Normal pulmonary vasculature. No effusion, infiltrate, or pneumothorax. No acute osseous abnormality. IMPRESSION: No acute cardiopulmonary process. Electronically Signed   By: Suzy Bouchard M.D.   On: 06/04/2022 15:27   Labs:   Basic Metabolic Panel: Recent Labs  Lab 06/04/22 1459 06/05/22 0546 06/06/22 0932  NA 141 140 139  K 4.3 3.8 3.9  CL 110 109 105  CO2 '24 24 27  '$ GLUCOSE 96 96 114*  BUN '22 16 20  '$ CREATININE 0.87 0.77 0.93  CALCIUM 10.0 9.3 10.2     CBC: Recent Labs  Lab 06/04/22 1459 06/05/22 0546  WBC 10.4 8.0  HGB  12.2 11.6*  HCT 38.1 35.4*  MCV 92.3 90.8  PLT 1,377* 1,173*         SIGNED:   Debbe Odea, MD  [facility] 06/06/2022, 1:03 PM

## 2022-06-06 NOTE — Progress Notes (Signed)
Pt getting echo will come back.

## 2022-06-06 NOTE — Progress Notes (Addendum)
BP 89/41 while asleep.  Have awakened patient, sat patient upright on side of the bed with feet on the floor and tightened her BP cuff which was very loose-  BP 128/62, 124/60, 134/62. Stable for dc. Patient is in agreement and happy. Notified her RN as well.

## 2022-06-07 ENCOUNTER — Telehealth: Payer: Self-pay | Admitting: *Deleted

## 2022-06-07 ENCOUNTER — Telehealth: Payer: Self-pay

## 2022-06-07 NOTE — Patient Outreach (Signed)
  Care Coordination TOC Note Transition Care Management Follow-up Telephone Call Date of discharge and from where: Nancy Merritt 06/06/22 How have you been since you were released from the hospital? "I am still feeling weak and tired, as expected" Any questions or concerns? No  Items Reviewed: Did the pt receive and understand the discharge instructions provided? Yes  Medications obtained and verified? Yes Needs to pick up [facility] today, med reconciliation completed Other? No  Any new allergies since your discharge? No  Dietary orders reviewed? Yes Do you have support at home? Yes   Home Care and Equipment/Supplies: Were home health services ordered? no If so, what is the name of the agency? N/a  Has the agency set up a time to come to the patient's home? no Were any new equipment or medical supplies ordered?  No What is the name of the medical supply agency? N/a Were you able to get the supplies/equipment? not applicable Do you have any questions related to the use of the equipment or supplies? No  Functional Questionnaire: (I = Independent and D = Dependent) ADLs: I  Bathing/Dressing- I  Meal Prep- I  Eating- I  Maintaining continence- I  Transferring/Ambulation- I  Managing Meds- I  Follow up appointments reviewed:  PCP Hospital f/u appt confirmed? No   Specialist Hospital f/u appt confirmed? Yes  Scheduled to see Nicholes Rough, PA-C  (cardiology) on 06/28/22 @ 3:10. Are transportation arrangements needed? No  If their condition worsens, is the pt aware to call PCP or go to the Emergency Dept.? Yes Was the patient provided with contact information for the PCP's office or ED? Yes Was to pt encouraged to call back with questions or concerns? Yes  SDOH assessments and interventions completed:   Yes SDOH Interventions Today    Flowsheet Row Most Recent Value  SDOH Interventions   Housing Interventions Intervention Not Indicated  Transportation Interventions Intervention  Not Indicated       Care Coordination Interventions:  PCP follow up appointment requested   Encounter Outcome:  Pt. Visit Completed

## 2022-06-07 NOTE — Progress Notes (Signed)
  Care Coordination  Note  06/07/2022 Name: Nancy Merritt MRN: [id number] DOB: 1940/08/08  Nancy Merritt is a 82 y.o. year old primary care patient of Ria Bush, MD. I reached out to [name] by phone today to assist with scheduling a follow up appointment. [name] verbally consented to my assistance.       Follow up plan: Hospital Follow Up appointment scheduled with Danise Mina, Garlon Hatchet, MD) on (06/11/22) at (9:00 AM).  [place]  Direct Dial: [phone]

## 2022-06-11 ENCOUNTER — Ambulatory Visit (INDEPENDENT_AMBULATORY_CARE_PROVIDER_SITE_OTHER): Payer: PPO | Admitting: Family Medicine

## 2022-06-11 ENCOUNTER — Encounter: Payer: Self-pay | Admitting: Family Medicine

## 2022-06-11 ENCOUNTER — Telehealth: Payer: Self-pay

## 2022-06-11 VITALS — BP 166/84 | HR 68 | Temp 97.4°F | Ht 63.0 in | Wt 166.0 lb

## 2022-06-11 DIAGNOSIS — I2510 Atherosclerotic heart disease of native coronary artery without angina pectoris: Secondary | ICD-10-CM | POA: Diagnosis not present

## 2022-06-11 DIAGNOSIS — I10 Essential (primary) hypertension: Secondary | ICD-10-CM | POA: Diagnosis not present

## 2022-06-11 DIAGNOSIS — J411 Mucopurulent chronic bronchitis: Secondary | ICD-10-CM

## 2022-06-11 DIAGNOSIS — R7303 Prediabetes: Secondary | ICD-10-CM | POA: Diagnosis not present

## 2022-06-11 DIAGNOSIS — J4530 Mild persistent asthma, uncomplicated: Secondary | ICD-10-CM

## 2022-06-11 DIAGNOSIS — I5041 Acute combined systolic (congestive) and diastolic (congestive) heart failure: Secondary | ICD-10-CM | POA: Diagnosis not present

## 2022-06-11 DIAGNOSIS — I214 Non-ST elevation (NSTEMI) myocardial infarction: Secondary | ICD-10-CM | POA: Diagnosis not present

## 2022-06-11 DIAGNOSIS — D75839 Thrombocytosis, unspecified: Secondary | ICD-10-CM

## 2022-06-11 MED ORDER — SPIRIVA RESPIMAT 2.5 MCG/ACT IN AERS: 2.0000 | INHALATION_SPRAY | Freq: Every day | RESPIRATORY_TRACT | 4 refills | Status: DC

## 2022-06-11 MED ORDER — ALBUTEROL SULFATE (2.5 MG/3ML) 0.083% IN NEBU: 2.5000 mg | INHALATION_SOLUTION | Freq: Four times a day (QID) | RESPIRATORY_TRACT | 1 refills | Status: DC | PRN

## 2022-06-11 MED ORDER — ACETAMINOPHEN 500 MG PO TABS: 500.0000 mg | ORAL_TABLET | Freq: Three times a day (TID) | ORAL | Status: AC | PRN

## 2022-06-11 MED ORDER — FLUTICASONE FUROATE-VILANTEROL 200-25 MCG/ACT IN AEPB: 1.0000 | INHALATION_SPRAY | Freq: Every day | RESPIRATORY_TRACT | 4 refills | Status: DC

## 2022-06-11 MED ORDER — ALBUTEROL SULFATE HFA 108 (90 BASE) MCG/ACT IN AERS: 2.0000 | INHALATION_SPRAY | RESPIRATORY_TRACT | 3 refills | Status: DC | PRN

## 2022-06-11 NOTE — Patient Instructions (Addendum)
Ambulatory pulse ox today.  We will refer you to [facility] to blood doctor to discuss elevated platelets.  No med changes at this time - keep cardiology appointment. Schedule physical in 1-2 months.  Keep track of blood pressures at home, let me know if consistently >150/90.

## 2022-06-11 NOTE — Progress Notes (Signed)
[name], female    DOB: 07-30-1940, 82 y.o.   MRN: [id number]  This visit was conducted in person.  BP (!) 166/84   Pulse 68   Temp (!) 97.4 F (36.3 C) (Temporal)   Ht '5\' 3"'$  (1.6 m)   Wt 166 lb (75.3 kg)   SpO2 95%   BMI 29.41 kg/m   166/88 on repeat testing.   CC: hosp f/u visit  Subjective:   HPI: Nancy Merritt is a 82 y.o. female presenting on 06/11/2022 for Hospitalization Follow-up (Admitted on 06/04/22 at [facility], dx NSTEMI myocardial infarction. Pt accompanied by daughter, [name]. )   See prior note for details.   Recent hospitalization for substernal chest pressure found to have NSTEMI with troponin levels peaked at 573 thought due to hypertensive emergency. Treated with cardiac catheterization revealing severe CAD unchanged from prior, also concern for volume overload.  Started on carvedilol, entresto, spironolactone however carvedilol reduced, spironolactone stopped due to hypotension.  For COPD - continued breo ellipta and spiriva.   Hospital records reviewed. Med rec performed.  Aspirin '325mg'$  changed to aspirin '81mg'$  + plavix (for 1 year).  Recommend cardiac catheterization  Since home, BP running better 140s/70s. She's feeling well, no further chest pain, leg swelling, orthopnea. Notes chronic trouble sleeping on back due to back pain- sleeps on her sides. Uses 1 pillow to sleep at night. Notes ongoing exertional dyspnea. She is trying to limit salt/sodium intake.   She has not taken any medications yet besides ezetimibe this morning.   Ex smoker - quit 2000s.   Home health not set up.  She already is established with [facility].  Other follow up appointments scheduled: cardiology 06/28/2022, again 08/2022 ______________________________________________________________________ Hospital admission: 06/04/2022 Hospital discharge: 06/06/2022 TCM f/u phone call: 06/07/2022 performed   Recommendations for Outpatient Follow-up:  She will require close  cardiology follow-up   Consults:  Cardiology Procedures:  Cardiac cath    Discharge Diagnoses:   Principal Problem:   NSTEMI (non-ST elevated myocardial infarction) [facility]) Active Problems:   Hypertensive emergency   Acute combined systolic and diastolic heart failure (HCC)   Depression, recurrent ([place])   Hx of breast cancer   CAD (coronary artery disease)   COPD (chronic obstructive pulmonary disease) ([place])   History of CVA (cerebrovascular accident) without residual deficits   Thrombocytosis, unspecified     Relevant past medical, surgical, family and social history reviewed and updated as indicated. Interim medical history since our last visit reviewed. Allergies and medications reviewed and updated. Outpatient Medications Prior to Visit  Medication Sig Dispense Refill   aspirin 81 MG chewable tablet Chew 1 tablet (81 mg total) by mouth daily.     carvedilol (COREG) 6.25 MG tablet Take 1 tablet (6.25 mg total) by mouth 2 (two) times daily with a meal. 60 tablet 11   clopidogrel (PLAVIX) 75 MG tablet Take 1 tablet (75 mg total) by mouth daily with breakfast. 90 tablet 1   ezetimibe (ZETIA) 10 MG tablet Take 1 tablet (10 mg total) by mouth daily. 90 tablet 3   loratadine (CLARITIN) 10 MG tablet Take 10 mg by mouth daily.     Multiple Vitamin (MULTIVITAMIN) tablet Take 1 tablet by mouth daily.     Multiple Vitamins-Minerals (PRESERVISION AREDS 2) CAPS Take 2 each by mouth daily at 12 noon.     nitroGLYCERIN (NITROSTAT) 0.4 MG SL tablet Place 1 tablet (0.4 mg total) under the tongue every 5 (five) minutes as needed for chest  pain. 25 tablet 3   rosuvastatin (CRESTOR) 20 MG tablet Take 1 tablet (20 mg total) by mouth at bedtime. 90 tablet 3   sacubitril-valsartan (ENTRESTO) 24-26 MG Take 1 tablet by mouth 2 (two) times daily. 60 tablet 11   albuterol (PROVENTIL) (2.5 MG/3ML) 0.083% nebulizer solution INHALE 1 VIAL VIA NEBULIZER 3 TIMES A DAY AS NEEDED (Patient taking differently: Take  2.5 mg by nebulization every 6 (six) hours as needed for shortness of breath.) 75 mL 2   albuterol (VENTOLIN HFA) 108 (90 Base) MCG/ACT inhaler Inhale 2 puffs into the lungs every 4 (four) hours as needed for wheezing or shortness of breath. 6.7 g 3   BREO ELLIPTA 200-25 MCG/ACT AEPB INHALE 1 PUFF BY MOUTH EVERY DAY (Patient taking differently: Inhale 1 puff into the lungs daily.) 180 each 1   Tiotropium Bromide Monohydrate (SPIRIVA RESPIMAT) 2.5 MCG/ACT AERS INHALE 2 PUFFS INTO THE LUNGS EVERY DAY (Patient taking differently: Inhale 1 each into the lungs daily.) 12 g 2   Zinc 50 MG CAPS Take 50 mg by mouth daily.     No facility-administered medications prior to visit.     Per HPI unless specifically indicated in ROS section below Review of Systems  Objective:  BP (!) 166/84   Pulse 68   Temp (!) 97.4 F (36.3 C) (Temporal)   Ht '5\' 3"'$  (1.6 m)   Wt 166 lb (75.3 kg)   SpO2 95%   BMI 29.41 kg/m   Wt Readings from Last 3 Encounters:  06/11/22 166 lb (75.3 kg)  06/05/22 167 lb 15.9 oz (76.2 kg)  06/04/22 168 lb (76.2 kg)      Physical Exam Vitals and nursing note reviewed.  Constitutional:      Appearance: Normal appearance.  HENT:     Head: Normocephalic and atraumatic.     Mouth/Throat:     Mouth: Mucous membranes are moist.     Pharynx: Oropharynx is clear. No oropharyngeal exudate or posterior oropharyngeal erythema.  Eyes:     Extraocular Movements: Extraocular movements intact.     Pupils: Pupils are equal, round, and reactive to light.  Cardiovascular:     Rate and Rhythm: Normal rate and regular rhythm.     Pulses: Normal pulses.     Heart sounds: Normal heart sounds. No murmur heard. Pulmonary:     Effort: Pulmonary effort is normal. No respiratory distress.     Breath sounds: Wheezing (coarse end expiratory wheezing throughout) present. No rhonchi or rales.  Abdominal:     General: Abdomen is protuberant.  Musculoskeletal:     Right lower leg: No edema.      Left lower leg: No edema.  Skin:    General: Skin is warm and dry.     Findings: No rash.  Neurological:     Mental Status: She is alert.  Psychiatric:        Mood and Affect: Mood normal.        Behavior: Behavior normal.       Results for orders placed or performed during the hospital encounter of 35/59/74  Basic metabolic panel  Result Value Ref Range   Sodium 141 135 - 145 mmol/L   Potassium 4.3 3.5 - 5.1 mmol/L   Chloride 110 98 - 111 mmol/L   CO2 24 22 - 32 mmol/L   Glucose, Bld 96 70 - 99 mg/dL   BUN 22 8 - 23 mg/dL   Creatinine, Ser 0.87 0.44 - 1.00 mg/dL  Calcium 10.0 8.9 - 10.3 mg/dL   GFR, Estimated >60 >60 mL/min   Anion gap 7 5 - 15  CBC  Result Value Ref Range   WBC 10.4 4.0 - 10.5 K/uL   RBC 4.13 3.87 - 5.11 MIL/uL   Hemoglobin 12.2 12.0 - 15.0 g/dL   HCT 38.1 36.0 - 46.0 %   MCV 92.3 80.0 - 100.0 fL   MCH 29.5 26.0 - 34.0 pg   MCHC 32.0 30.0 - 36.0 g/dL   RDW 16.0 (H) 11.5 - 15.5 %   Platelets 1,377 (HH) 150 - 400 K/uL   nRBC 0.0 0.0 - 0.2 %  Heparin level (unfractionated)  Result Value Ref Range   Heparin Unfractionated <0.10 (L) 0.30 - 0.70 IU/mL  CBC  Result Value Ref Range   WBC 8.0 4.0 - 10.5 K/uL   RBC 3.90 3.87 - 5.11 MIL/uL   Hemoglobin 11.6 (L) 12.0 - 15.0 g/dL   HCT 35.4 (L) 36.0 - 46.0 %   MCV 90.8 80.0 - 100.0 fL   MCH 29.7 26.0 - 34.0 pg   MCHC 32.8 30.0 - 36.0 g/dL   RDW 15.8 (H) 11.5 - 15.5 %   Platelets 1,173 (HH) 150 - 400 K/uL   nRBC 0.0 0.0 - 0.2 %  Basic metabolic panel  Result Value Ref Range   Sodium 140 135 - 145 mmol/L   Potassium 3.8 3.5 - 5.1 mmol/L   Chloride 109 98 - 111 mmol/L   CO2 24 22 - 32 mmol/L   Glucose, Bld 96 70 - 99 mg/dL   BUN 16 8 - 23 mg/dL   Creatinine, Ser 0.77 0.44 - 1.00 mg/dL   Calcium 9.3 8.9 - 10.3 mg/dL   GFR, Estimated >60 >60 mL/min   Anion gap 7 5 - 15  Basic metabolic panel  Result Value Ref Range   Sodium 139 135 - 145 mmol/L   Potassium 3.9 3.5 - 5.1 mmol/L   Chloride 105 98 -  111 mmol/L   CO2 27 22 - 32 mmol/L   Glucose, Bld 114 (H) 70 - 99 mg/dL   BUN 20 8 - 23 mg/dL   Creatinine, Ser 0.93 0.44 - 1.00 mg/dL   Calcium 10.2 8.9 - 10.3 mg/dL   GFR, Estimated >60 >60 mL/min   Anion gap 7 5 - 15  ECHOCARDIOGRAM COMPLETE  Result Value Ref Range   Weight 2,687.85 oz   Height 63 in   BP 127/63 mmHg   Single Plane A2C EF 65.9 %   Single Plane A4C EF 64.9 %   Calc EF 65.7 %   S' Lateral 2.30 cm   Area-P 1/2 1.81 cm2   Est EF 60 - 65%   Troponin I (High Sensitivity)  Result Value Ref Range   Troponin I (High Sensitivity) 253 (HH) <18 ng/L  Troponin I (High Sensitivity)  Result Value Ref Range   Troponin I (High Sensitivity) 574 (HH) <18 ng/L   Lab Results  Component Value Date   HGBA1C 6.3 01/31/2022    Echocardiogram 05/2022 - LVEF 60-65%,normal ventricular function, normal wall motion, mild LVH, G1DD, mod calcification of AV without AR/AS.   L heart catheterization 05/2022:  1.  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 2.  Mildly reduced LV systolic function with moderately elevated left ventricular end-diastolic pressure at 28 mmHg.  Could not exclude wall motion abnormalities.  Assessment & Plan:   Problem List Items Addressed This Visit     Mild persistent asthma   Relevant Medications   fluticasone furoate-vilanterol (BREO ELLIPTA) 200-25 MCG/ACT AEPB   Tiotropium Bromide Monohydrate (SPIRIVA RESPIMAT) 2.5 MCG/ACT AERS   albuterol (PROVENTIL) (2.5 MG/3ML) 0.083% nebulizer solution   albuterol (VENTOLIN HFA) 108 (90 Base) MCG/ACT inhaler   Hypertension    BP elevated today in setting of not taking carvedilol or entresto yet this morning. Spironolactone initially started during hospitalization however stopped due to low BP in hospital, however pt states bp cuff was loose on arm when this was measured. She states home readings have been  140/70s, advised take BP med then recheck BP when she gets home, call us with readings. Consider diuretic commencement pending home readings. Bring log to upcoming cardiology appt.       CAD (coronary artery disease)    Recent angina in setting of NSTEMI, symptoms largely resolved. She has SL nitro at home. Rec keep cards f/u next month.       COPD (chronic obstructive pulmonary disease) ([place])    Reviewed controller vs rescue inhaler.  Continue breo 1 puff daily, spiriva respimat 2 puffs nightly, albuterol inh/neb PRN.  Normal ambulatory pulse ox today - maintains saturation >90% off supplemental oxygen.       Relevant Medications   fluticasone furoate-vilanterol (BREO ELLIPTA) 200-25 MCG/ACT AEPB   Tiotropium Bromide Monohydrate (SPIRIVA RESPIMAT) 2.5 MCG/ACT AERS   albuterol (PROVENTIL) (2.5 MG/3ML) 0.083% nebulizer solution   albuterol (VENTOLIN HFA) 108 (90 Base) MCG/ACT inhaler   Prediabetes    Latest A1c stable at 6.3%      Thrombocytosis, unspecified    Previously saw Dr Alen Blew on hydroxyurea '1000mg'$  nightly, off this for several months.  Latest plt count >1000.  Reviewed increased risk of clot with high platelet count.  Desires to establish care in [place] - referral placed.       Relevant Orders   Ambulatory referral to Hematology / Oncology   NSTEMI (non-ST elevated myocardial infarction) ([place]) - Primary    Recent NSTEMI with TnI peak at 570s. Records reviewed - thought demand ischemia in setting of hypertensive emergency. S/p L heart catheterization showing known 3vCAD with patent grafts, EF 45-50%. Subsequent echocardiogram was largely reassuring with EF 60-65%.  She is back on aspirin + plavix, statin + zetia, new commencement of entresto and carvedilol.  Continue current regimen, keep cards f/u.       Acute combined systolic and diastolic heart failure (HCC)    Acute combined CHF exacerbation thought contributing to NSTEMI, discussed limiting salt in diet (low  salt diet handout provided), checking daily weights and bring data to next cardiology appt.         Meds ordered this encounter  Medications   fluticasone furoate-vilanterol (BREO ELLIPTA) 200-25 MCG/ACT AEPB    Sig: Inhale 1 puff into the lungs daily.    Dispense:  90 each    Refill:  4   Tiotropium Bromide Monohydrate (SPIRIVA RESPIMAT) 2.5 MCG/ACT AERS    Sig: Inhale 2 puffs into the lungs daily. INHALE 2 PUFFS INTO THE LUNGS EVERY DAY    Dispense:  12 g    Refill:  4   albuterol (PROVENTIL) (2.5 MG/3ML) 0.083% nebulizer solution    Sig: Take 3 mLs (2.5 mg total) by nebulization every 6 (six) hours as needed for shortness of breath.    Dispense:  150 mL    Refill:  1   albuterol (  VENTOLIN HFA) 108 (90 Base) MCG/ACT inhaler    Sig: Inhale 2 puffs into the lungs every 4 (four) hours as needed for wheezing or shortness of breath.    Dispense:  6.7 g    Refill:  3    Orders Placed This Encounter  Procedures   Ambulatory referral to Hematology / Oncology    Referral Priority:   Routine    Referral Type:   Consultation    Referral Reason:   Specialty Services Required    Requested Specialty:   Oncology    Number of Visits Requested:   1    Patient Instructions  Ambulatory pulse ox today.  We will refer [facility] to [facility] to blood doctor to discuss elevated platelets.  No med changes at this time - keep cardiology appointment. Schedule physical in 1-2 months.  Keep track of blood pressures at home, let me know if consistently >150/90.   Follow up plan: Return in about 2 months (around 08/10/2022), or if symptoms worsen or fail to improve, for annual exam, prior fasting for blood work.  Ria Bush, MD

## 2022-06-11 NOTE — Assessment & Plan Note (Addendum)
Acute combined CHF exacerbation thought contributing to NSTEMI, discussed limiting salt in diet (low salt diet handout provided), checking daily weights and bring data to next cardiology appt.

## 2022-06-11 NOTE — Telephone Encounter (Signed)
Pt was seen in office today and forgot to ask Dr. Darnell Level if it's ok to continue Tylenol daily.   Lvm asking pt/pt's daughter, [name] (on dpr) to call back. Need reason why pt needs daily Tylenol.

## 2022-06-11 NOTE — Assessment & Plan Note (Addendum)
Recent angina in setting of NSTEMI, symptoms largely resolved. She has SL nitro at home. Rec keep cards f/u next month.

## 2022-06-11 NOTE — Assessment & Plan Note (Signed)
Latest A1c stable at 6.3%

## 2022-06-11 NOTE — Assessment & Plan Note (Signed)
BP elevated today in setting of not taking carvedilol or entresto yet this morning. Spironolactone initially started during hospitalization however stopped due to low BP in hospital, however pt states bp cuff was loose on arm when this was measured. She states home readings have been 140/70s, advised take BP med then recheck BP when she gets home, call us with readings. Consider diuretic commencement pending home readings. Bring log to upcoming cardiology appt.

## 2022-06-11 NOTE — Telephone Encounter (Signed)
Ok to take ES tylenol '500mg'$  daily for arthritis.

## 2022-06-11 NOTE — Assessment & Plan Note (Addendum)
Reviewed controller vs rescue inhaler.  Continue breo 1 puff daily, spiriva respimat 2 puffs nightly, albuterol inh/neb PRN.  Normal ambulatory pulse ox today - maintains saturation >90% off supplemental oxygen.

## 2022-06-11 NOTE — Assessment & Plan Note (Signed)
Recent NSTEMI with TnI peak at 570s. Records reviewed - thought demand ischemia in setting of hypertensive emergency. S/p L heart catheterization showing known 3vCAD with patent grafts, EF 45-50%. Subsequent echocardiogram was largely reassuring with EF 60-65%.  She is back on aspirin + plavix, statin + zetia, new commencement of entresto and carvedilol.  Continue current regimen, keep cards f/u.

## 2022-06-11 NOTE — Telephone Encounter (Signed)
Pt's daughter returned Lisa's call. Told pt's daughter Lisa's response, pt's daughter stated the pt wanted to use Tylenol daily for arthritis pain. Call back # [phone]

## 2022-06-11 NOTE — Assessment & Plan Note (Addendum)
Previously saw Dr Alen Blew on hydroxyurea '1000mg'$  nightly, off this for several months.  Latest plt count >1000.  Reviewed increased risk of clot with high platelet count.  Desires to establish care in [place] - referral placed.

## 2022-06-11 NOTE — Addendum Note (Signed)
Addended by: Ria Bush on: 06/11/2022 04:59 PM   Modules accepted: Orders

## 2022-06-12 NOTE — Telephone Encounter (Signed)
Noted  

## 2022-06-12 NOTE — Telephone Encounter (Signed)
Lvm asking pt/pt's daughter, [name] (on dpr), to call back. Need to relay Dr. Synthia Innocent message.

## 2022-06-12 NOTE — Telephone Encounter (Signed)
Patient called back in and relayed Dr. Synthia Innocent message below.

## 2022-06-14 ENCOUNTER — Telehealth (HOSPITAL_COMMUNITY): Payer: Self-pay

## 2022-06-14 NOTE — Telephone Encounter (Signed)
Pt insurance is active and benefits verified through [employer]. Co-pay $15.00, DED $0.00/$0.00 met, out of pocket $3,200.00/$1.54 met, co-insurance 0%. No pre-authorization required. Aditya/[employer], 06/14/22 @ 11:55AM, [id number]   How many CR sessions are covered? (36 sessions for TCR, 72 sessions for ICR)72 Is this a lifetime maximum or an annual maximum? Lifetime Has the member used any of these services to date? No Is there a time limit (weeks/months) on start of program and/or program completion? No     Will contact patient to see if she is interested in the Cardiac Rehab Program. If interested, patient will need to complete follow up appt. Once completed, patient will be contacted for scheduling upon review by the RN Navigator.

## 2022-06-14 NOTE — Telephone Encounter (Signed)
Called patient to see if she is interested in the Cardiac Rehab Program. Patient expressed interest. Explained scheduling process and went over insurance, patient verbalized understanding. Will contact patient for scheduling once f/u has been completed. 

## 2022-06-20 ENCOUNTER — Inpatient Hospital Stay: Payer: PPO

## 2022-06-20 ENCOUNTER — Encounter: Payer: Self-pay | Admitting: Oncology

## 2022-06-20 ENCOUNTER — Inpatient Hospital Stay: Payer: PPO | Attending: Oncology | Admitting: Oncology

## 2022-06-20 DIAGNOSIS — Z87891 Personal history of nicotine dependence: Secondary | ICD-10-CM | POA: Diagnosis not present

## 2022-06-20 DIAGNOSIS — Z8616 Personal history of COVID-19: Secondary | ICD-10-CM | POA: Diagnosis not present

## 2022-06-20 DIAGNOSIS — D75839 Thrombocytosis, unspecified: Secondary | ICD-10-CM

## 2022-06-20 DIAGNOSIS — I519 Heart disease, unspecified: Secondary | ICD-10-CM

## 2022-06-20 DIAGNOSIS — D473 Essential (hemorrhagic) thrombocythemia: Secondary | ICD-10-CM

## 2022-06-20 DIAGNOSIS — Z803 Family history of malignant neoplasm of breast: Secondary | ICD-10-CM | POA: Diagnosis not present

## 2022-06-20 LAB — CBC WITH DIFFERENTIAL/PLATELET
Abs Immature Granulocytes: 0.17 10*3/uL — ABNORMAL HIGH (ref 0.00–0.07)
Basophils Absolute: 0.1 10*3/uL (ref 0.0–0.1)
Basophils Relative: 1 %
Eosinophils Absolute: 0.3 10*3/uL (ref 0.0–0.5)
Eosinophils Relative: 3 %
HCT: 39 % (ref 36.0–46.0)
Hemoglobin: 12.9 g/dL (ref 12.0–15.0)
Immature Granulocytes: 2 %
Lymphocytes Relative: 18 %
Lymphs Abs: 1.9 10*3/uL (ref 0.7–4.0)
MCH: 29.5 pg (ref 26.0–34.0)
MCHC: 33.1 g/dL (ref 30.0–36.0)
MCV: 89 fL (ref 80.0–100.0)
Monocytes Absolute: 0.7 10*3/uL (ref 0.1–1.0)
Monocytes Relative: 7 %
Neutro Abs: 7.5 10*3/uL (ref 1.7–7.7)
Neutrophils Relative %: 69 %
Platelets: 1425 10*3/uL (ref 150–400)
RBC: 4.38 MIL/uL (ref 3.87–5.11)
RDW: 15.8 % — ABNORMAL HIGH (ref 11.5–15.5)
WBC: 10.6 10*3/uL — ABNORMAL HIGH (ref 4.0–10.5)
nRBC: 0 % (ref 0.0–0.2)

## 2022-06-20 LAB — PATHOLOGIST SMEAR REVIEW

## 2022-06-20 MED ORDER — HYDROXYUREA 500 MG PO CAPS: 500.0000 mg | ORAL_CAPSULE | Freq: Every day | ORAL | 1 refills | Status: DC

## 2022-06-20 NOTE — Progress Notes (Signed)
[facility]  [phone] [phone]  ID: Annaly Skop OB: January 09, 1941  MR#: [id number]  AOZ#:[id number]  Patient Care Team: Ria Bush, MD as PCP - General (Family Medicine) Freada Bergeron, MD as PCP - Cardiology (Cardiology) Margaretha Seeds, MD as Consulting Physician (Pulmonary Disease)  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  INTERVAL HISTORY: Patient is an 82 year old female with a longstanding history of essential thrombocytosis, who was recently taking Hydrea but has not had treatment for some time.  She is transferring care from another cancer center.  She has chronic weakness and fatigue, but otherwise feels well.  She was recently discharged from the hospital after sustaining an NSTEMI.  She has no neurologic complaints.  She denies any recent fevers or illnesses.  She has a good appetite and denies weight loss.  She has no chest pain, shortness of breath, cough, or hemoptysis.  She denies any nausea, vomiting, constipation, or diarrhea.  She has no urinary complaints.  Patient offers no further specific complaints today.  REVIEW OF SYSTEMS:   Review of Systems  Constitutional:  Positive for malaise/fatigue. Negative for fever and weight loss.  Respiratory: Negative.  Negative for cough, hemoptysis and shortness of breath.   Cardiovascular: Negative.  Negative for chest pain and leg swelling.  Gastrointestinal: Negative.  Negative for abdominal pain.  Genitourinary: Negative.  Negative for dysuria.  Musculoskeletal: Negative.  Negative for back pain and myalgias.  Skin: Negative.  Negative for rash.  Neurological:  Positive for weakness. Negative for dizziness, focal weakness and headaches.  Psychiatric/Behavioral: Negative.  The patient is not nervous/anxious.     As per HPI. Otherwise, a complete review of systems is negative.  PAST MEDICAL HISTORY: Past Medical History:  Diagnosis Date    Arthritis    Asthma    Cancer ([place]) 1990   COVID-19 06/14/2020   Depression     PAST SURGICAL HISTORY: Past Surgical History:  Procedure Laterality Date   BREAST SURGERY Left 1990   CORONARY ARTERY BYPASS GRAFT     JOINT REPLACEMENT Right    Hip   LEFT HEART CATH AND CORS/GRAFTS ANGIOGRAPHY N/A 06/05/2022   Procedure: LEFT HEART CATH AND CORS/GRAFTS ANGIOGRAPHY;  Surgeon: Wellington Hampshire, MD;  Location: [facility];  Service: Cardiovascular;  Laterality: N/A;   OVARIAN CYST REMOVAL  1970   TUBAL LIGATION  1970    FAMILY HISTORY: Family History  Problem Relation Age of Onset   Cancer Father        lung   Heart attack Father 47   Alcohol abuse Father    Arthritis Father    COPD Father    Diabetes Father    Hypertension Father    Breast cancer Sister 43   Arthritis Sister    Cancer Sister    COPD Sister    Drug abuse Sister    Hypertension Sister    Miscarriages / [place] Sister    Thyroid disease Sister    Arthritis Mother    COPD Mother    Diabetes Mother    Hearing loss Mother    Hypertension Mother    Miscarriages / [place] Mother    Stroke Mother    Alcohol abuse Brother    Arthritis Brother    Hypertension Brother    Arthritis Daughter    Asthma Daughter    Depression Daughter    Hypertension Daughter    57 / [place] Daughter    Thyroid disease Daughter  Arthritis Maternal Grandmother    Hypertension Maternal Grandmother    Stroke Maternal Grandmother    Thyroid disease Maternal Grandmother    Alcohol abuse Maternal Grandfather    Arthritis Maternal Grandfather    Cancer Maternal Grandfather    Depression Maternal Grandfather    Hearing loss Maternal Grandfather    Hypertension Maternal Grandfather    Arthritis Sister    Thyroid disease Sister    Thyroid disease Daughter    Hypertension Daughter    Depression Daughter    COPD Daughter    Alcohol abuse Daughter    Arthritis Daughter    Arthritis Daughter     COPD Daughter    Depression Daughter    Hearing loss Daughter    Hypertension Daughter    Thyroid disease Daughter     ADVANCED DIRECTIVES (Y/N):  N  HEALTH MAINTENANCE: Social History   Tobacco Use   Smoking status: Former    Packs/day: 1.00    Years: 40.00    Total pack years: 40.00    Types: Cigarettes    Quit date: 2002    Years since quitting: 22.0   Smokeless tobacco: Never  Vaping Use   Vaping Use: Never used  Substance Use Topics   Alcohol use: Not Currently   Drug use: Never     Colonoscopy:  PAP:  Bone density:  Lipid panel:  Allergies  Allergen Reactions   Advair Diskus [Fluticasone-Salmeterol] Shortness Of Breath    As of 09/01/2014. Pt can take Breo   Levofloxacin Other (See Comments)    Nausea, brain fog, depression, as of 12/12/14   Qvar [Beclomethasone] Other (See Comments)    Lungs closing-as of 05/03/2015   Singulair [Montelukast] Shortness Of Breath   Symbicort [Budesonide-Formoterol Fumarate] Other (See Comments)    Hard time breathing as of 10/31/2014.    Current Outpatient Medications  Medication Sig Dispense Refill   albuterol (PROVENTIL) (2.5 MG/3ML) 0.083% nebulizer solution Take 3 mLs (2.5 mg total) by nebulization every 6 (six) hours as needed for shortness of breath. 150 mL 1   albuterol (VENTOLIN HFA) 108 (90 Base) MCG/ACT inhaler Inhale 2 puffs into the lungs every 4 (four) hours as needed for wheezing or shortness of breath. 6.7 g 3   aspirin 81 MG chewable tablet Chew 1 tablet (81 mg total) by mouth daily.     carvedilol (COREG) 6.25 MG tablet Take 1 tablet (6.25 mg total) by mouth 2 (two) times daily with a meal. 60 tablet 11   clopidogrel (PLAVIX) 75 MG tablet Take 1 tablet (75 mg total) by mouth daily with breakfast. 90 tablet 1   ezetimibe (ZETIA) 10 MG tablet Take 1 tablet (10 mg total) by mouth daily. 90 tablet 3   fluticasone furoate-vilanterol (BREO ELLIPTA) 200-25 MCG/ACT AEPB Inhale 1 puff into the lungs daily. 90 each 4    loratadine (CLARITIN) 10 MG tablet Take 10 mg by mouth daily.     Multiple Vitamin (MULTIVITAMIN) tablet Take 1 tablet by mouth daily.     Multiple Vitamins-Minerals (PRESERVISION AREDS 2) CAPS Take 2 each by mouth daily at 12 noon.     nitroGLYCERIN (NITROSTAT) 0.4 MG SL tablet Place 1 tablet (0.4 mg total) under the tongue every 5 (five) minutes as needed for chest pain. 25 tablet 3   rosuvastatin (CRESTOR) 20 MG tablet Take 1 tablet (20 mg total) by mouth at bedtime. 90 tablet 3   sacubitril-valsartan (ENTRESTO) 24-26 MG Take 1 tablet by mouth 2 (two) times daily.  60 tablet 11   Tiotropium Bromide Monohydrate (SPIRIVA RESPIMAT) 2.5 MCG/ACT AERS Inhale 2 puffs into the lungs daily. INHALE 2 PUFFS INTO THE LUNGS EVERY DAY 12 g 4   acetaminophen (TYLENOL) 500 MG tablet Take 1 tablet (500 mg total) by mouth 3 (three) times daily as needed for moderate pain. (Patient not taking: Reported on 06/20/2022)     No current facility-administered medications for this visit.    OBJECTIVE: Vitals:   06/20/22 1051  BP: (!) 140/68  Pulse: 75  Resp: (!) 22  Temp: 98.2 F (36.8 C)  SpO2: 96%     Body mass index is 29.16 kg/m.    ECOG FS:0 - Asymptomatic  General: Well-developed, well-nourished, no acute distress. Eyes: Pink conjunctiva, anicteric sclera. HEENT: Normocephalic, moist mucous membranes. Lungs: No audible wheezing or coughing. Heart: Regular rate and rhythm. Abdomen: Soft, nontender, no obvious distention. Musculoskeletal: No edema, cyanosis, or clubbing. Neuro: Alert, answering all questions appropriately. Cranial nerves grossly intact. Skin: No rashes or petechiae noted. Psych: Normal affect. Lymphatics: No cervical, calvicular, axillary or inguinal LAD.   LAB RESULTS:  Lab Results  Component Value Date   NA 139 06/06/2022   K 3.9 06/06/2022   CL 105 06/06/2022   CO2 27 06/06/2022   GLUCOSE 114 (H) 06/06/2022   BUN 20 06/06/2022   CREATININE 0.93 06/06/2022   CALCIUM 10.2  06/06/2022   PROT 6.5 01/31/2022   ALBUMIN 3.8 01/31/2022   AST 15 01/31/2022   ALT 12 01/31/2022   ALKPHOS 89 01/31/2022   BILITOT 0.5 01/31/2022   GFRNONAA >60 06/06/2022   GFRAA >60 01/07/2020    Lab Results  Component Value Date   WBC 8.0 06/05/2022   NEUTROABS 6.2 10/11/2021   HGB 11.6 (L) 06/05/2022   HCT 35.4 (L) 06/05/2022   MCV 90.8 06/05/2022   PLT 1,173 ([place]) 06/05/2022     STUDIES: ECHOCARDIOGRAM COMPLETE  Result Date: 06/06/2022    ECHOCARDIOGRAM REPORT   Patient Name:   Khrystyna Schwalm Date of Exam: 06/06/2022 Medical Rec #:  [id number]        Height:       63.0 in Accession #:    [id number]       Weight:       168.0 lb Date of Birth:  1941/02/08        BSA:          1.795 m Patient Age:    54 years         BP:           118/74 mmHg Patient Gender: F                HR:           58 bpm. Exam Location:  Inpatient Procedure: 2D Echo, Cardiac Doppler and Color Doppler Indications:    NSTEMI I21.4  History:        Patient has prior history of Echocardiogram examinations, most                 recent 08/09/2020.  Sonographer:    Bernadene Person RDCS Referring Phys: Isaiah Serge IMPRESSIONS  1. Left ventricular ejection fraction, by estimation, is 60 to 65%. The left ventricle has normal function. The left ventricle has no regional wall motion abnormalities. There is mild concentric left ventricular hypertrophy. Left ventricular diastolic parameters are consistent with Grade I diastolic dysfunction (impaired relaxation).  2. Right ventricular systolic function is normal. The right ventricular size is  normal. Tricuspid regurgitation signal is inadequate for assessing PA pressure.  3. No evidence of mitral valve regurgitation.  4. There is moderate calcification of the aortic valve. Aortic valve regurgitation is not visualized.  5. The inferior vena cava is normal in size with greater than 50% respiratory variability, suggesting right atrial pressure of 3 mmHg. Comparison(s): No  significant change from prior study. FINDINGS  Left Ventricle: Left ventricular ejection fraction, by estimation, is 60 to 65%. The left ventricle has normal function. The left ventricle has no regional wall motion abnormalities. The left ventricular internal cavity size was normal in size. There is  mild concentric left ventricular hypertrophy. Left ventricular diastolic parameters are consistent with Grade I diastolic dysfunction (impaired relaxation). Right Ventricle: The right ventricular size is normal. Right ventricular systolic function is normal. Tricuspid regurgitation signal is inadequate for assessing PA pressure. Left Atrium: Left atrial size was normal in size. Right Atrium: Right atrial size was normal in size. Pericardium: There is no evidence of pericardial effusion. Mitral Valve: No evidence of mitral valve regurgitation. Tricuspid Valve: Tricuspid valve regurgitation is not demonstrated. Aortic Valve: There is moderate calcification of the aortic valve. Aortic valve regurgitation is not visualized. Pulmonic Valve: Pulmonic valve regurgitation is not visualized. Aorta: The aortic root and ascending aorta are structurally normal, with no evidence of dilitation. Venous: The inferior vena cava is normal in size with greater than 50% respiratory variability, suggesting right atrial pressure of 3 mmHg. IAS/Shunts: The interatrial septum was not well visualized.  LEFT VENTRICLE PLAX 2D LVIDd:         3.50 cm     Diastology LVIDs:         2.30 cm     LV e' medial:    4.95 cm/s LV PW:         0.90 cm     LV E/e' medial:  14.3 LV IVS:        1.00 cm     LV e' lateral:   5.78 cm/s LVOT diam:     2.00 cm     LV E/e' lateral: 12.2 LV SV:         86 LV SV Index:   48 LVOT Area:     3.14 cm  LV Volumes (MOD) LV vol d, MOD A2C: 62.2 ml LV vol d, MOD A4C: 57.2 ml LV vol s, MOD A2C: 21.2 ml LV vol s, MOD A4C: 20.1 ml LV SV MOD A2C:     41.0 ml LV SV MOD A4C:     57.2 ml LV SV MOD BP:      39.9 ml RIGHT VENTRICLE RV  S prime:     8.63 cm/s TAPSE (M-mode): 1.3 cm LEFT ATRIUM             Index        RIGHT ATRIUM           Index LA diam:        3.20 cm 1.78 cm/m   RA Area:     12.70 cm LA Vol (A2C):   37.2 ml 20.72 ml/m  RA Volume:   26.30 ml  14.65 ml/m LA Vol (A4C):   37.9 ml 21.11 ml/m LA Biplane Vol: 38.0 ml 21.16 ml/m  AORTIC VALVE             PULMONIC VALVE LVOT Vmax:   123.00 cm/s PR End Diast Vel: 6.55 msec LVOT Vmean:  84.300 cm/s LVOT VTI:    0.275 m  AORTA Ao Root diam: 3.10 cm Ao Asc diam:  3.70 cm MITRAL VALVE MV Area (PHT): 1.81 cm    SHUNTS MV Decel Time: 419 msec    Systemic VTI:  0.28 m MV E velocity: 70.70 cm/s  Systemic Diam: 2.00 cm MV A velocity: 91.70 cm/s MV E/A ratio:  0.77 Phineas Inches Electronically signed by Phineas Inches Signature Date/Time: 06/06/2022/10:33:04 AM    Final    CARDIAC CATHETERIZATION  Result Date: 06/05/2022   Prox Cx to Mid Cx lesion is 100% stenosed.   Mid LAD lesion is 100% stenosed.   Prox LAD to Mid LAD lesion is 70% stenosed.   1st Diag lesion is 100% stenosed.   Mid RCA lesion is 100% stenosed.   Origin to Prox Graft lesion is 30% stenosed.   RPAV lesion is 50% stenosed.   Prox RCA lesion is 80% stenosed.   LIMA graft was visualized by non-selective angiography and is normal in caliber.   SVG graft was visualized by angiography and is normal in caliber.   SVG graft was visualized by angiography.   The graft exhibits no disease.   The graft exhibits no disease.   The graft exhibits mild .   The left ventricular systolic function is normal.   LV end diastolic pressure is moderately elevated.   The left ventricular ejection fraction is 45-50% by visual estimate. 1.  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 2.  Mildly reduced LV systolic function with moderately elevated left ventricular end-diastolic pressure at 28 mmHg.  Could not exclude wall motion abnormalities.  Recommendations: No clear culprit is identified for small myocardial infarction which could be due to small vessel disease versus stress-induced cardiomyopathy.  Obtain an echocardiogram.  Recommend blood pressure control and consider gentle diuresis.  I added clopidogrel to be used for 1 year and referred the patient to cardiac rehab.   DG Chest 2 View  Result Date: 06/04/2022 CLINICAL DATA:  Chest pain EXAM: CHEST - 2 VIEW COMPARISON:  06/15/2020 FINDINGS: Sternotomy wires overlie normal cardiac silhouette. Normal pulmonary vasculature. No effusion, infiltrate, or pneumothorax. No acute osseous abnormality. IMPRESSION: No acute cardiopulmonary process. Electronically Signed   By: Suzy Bouchard M.D.   On: 06/04/2022 15:27    ASSESSMENT: Essential thrombocytosis with CALR mutation.  PLAN:    Essential thrombocytosis with CALR mutation: Patient's platelet count is significantly elevated greater than 1400 today.  She has not had treatment with Hydrea for a period of time.  Patient agreed to reinitiate Hydrea 500 mg daily.  Previously she was on 1000 mg daily and will likely need a increased dose.  No further intervention is needed.  Return to clinic in 1 month with repeat laboratory work, adjustment of treatment if needed, and further evaluation. Cardiac disease: Being medically managed.  Follow-up with her cardiologist as indicated.  I spent a total of 45 minutes reviewing chart data, face-to-face evaluation with the patient, counseling and coordination of care as detailed above.   Patient expressed understanding and was in agreement with this plan. She also understands that She can call clinic at any time with any questions, concerns, or complaints.    Lloyd Huger, MD   06/20/2022 11:18 AM

## 2022-06-26 ENCOUNTER — Ambulatory Visit (INDEPENDENT_AMBULATORY_CARE_PROVIDER_SITE_OTHER): Payer: PPO

## 2022-06-26 VITALS — Ht 61.0 in | Wt 165.0 lb

## 2022-06-26 DIAGNOSIS — Z0000 Encounter for general adult medical examination without abnormal findings: Secondary | ICD-10-CM | POA: Diagnosis not present

## 2022-06-26 NOTE — Progress Notes (Signed)
Virtual Visit via Telephone Note  I connected with  Nancy Merritt on 06/26/22 at  9:15 AM EST by telephone and verified that I am speaking with the correct person using two identifiers.  Location: Patient: Home Provider: Office Persons participating in the virtual visit: patient/Nurse Health Advisor   I discussed the limitations, risks, security and privacy concerns of performing an evaluation and management service by telephone and the availability of in person appointments. The patient expressed understanding and agreed to proceed.  Interactive audio and video telecommunications were attempted between this nurse and patient, however failed, due to patient having technical difficulties OR patient did not have access to video capability.  We continued and completed visit with audio only.  Some vital signs may be absent or patient reported.   Kaitrin Seybold Moody Bruins, LPN  Subjective:   Nancy Merritt is a 82 y.o. female who presents for Medicare Annual (Subsequent) preventive examination.  Review of Systems     Cardiac Risk Factors include: advanced age (>4mn, >>14women);hypertension     Objective:    Today's Vitals   06/26/22 0914 06/26/22 0915  Weight: 159 lb (72.1 kg) 165 lb (74.8 kg)  Height: '5\' 1"'$  (1.549 m) '5\' 1"'$  (1.549 m)  PainSc:  0-No pain   Body mass index is 31.18 kg/m.     06/26/2022    9:32 AM 06/20/2022   10:54 AM 06/04/2022    2:39 PM 02/19/2022    9:42 AM 06/25/2021    9:52 AM 06/16/2020    8:16 PM 06/15/2020    4:38 PM  Advanced Directives  Does Patient Have a Medical Advance Directive? Yes Yes No Yes Yes Yes No  Type of AParamedicof [place]Living will H[place]Living will  Living will [place] will [place]  Does patient want to make changes to medical advance directive? No - Patient declined   Yes (MAU/Ambulatory/Procedural Areas - Information given) Yes  (MAU/Ambulatory/Procedural Areas - Information given) No - Patient declined   Copy of [place]in Chart? Yes - validated most recent copy scanned in chart (See row information)    Yes - validated most recent copy scanned in chart (See row information) Yes - validated most recent copy scanned in chart (See row information)   Would patient like information on creating a medical advance directive? No - Patient declined  No - Patient declined    No - Patient declined    Current Medications (verified) Outpatient Encounter Medications as of 06/26/2022  Medication Sig   acetaminophen (TYLENOL) 500 MG tablet Take 1 tablet (500 mg total) by mouth 3 (three) times daily as needed for moderate pain.   albuterol (PROVENTIL) (2.5 MG/3ML) 0.083% nebulizer solution Take 3 mLs (2.5 mg total) by nebulization every 6 (six) hours as needed for shortness of breath.   albuterol (VENTOLIN HFA) 108 (90 Base) MCG/ACT inhaler Inhale 2 puffs into the lungs every 4 (four) hours as needed for wheezing or shortness of breath.   aspirin 81 MG chewable tablet Chew 1 tablet (81 mg total) by mouth daily.   carvedilol (COREG) 6.25 MG tablet Take 1 tablet (6.25 mg total) by mouth 2 (two) times daily with a meal.   clopidogrel (PLAVIX) 75 MG tablet Take 1 tablet (75 mg total) by mouth daily with breakfast.   ezetimibe (ZETIA) 10 MG tablet Take 1 tablet (10 mg total) by mouth daily.   fluticasone furoate-vilanterol (BREO ELLIPTA) 200-25 MCG/ACT  AEPB Inhale 1 puff into the lungs daily.   hydroxyurea (HYDREA) 500 MG capsule Take 1 capsule (500 mg total) by mouth daily. May take with food to minimize GI side effects.   loratadine (CLARITIN) 10 MG tablet Take 10 mg by mouth daily.   Multiple Vitamin (MULTIVITAMIN) tablet Take 1 tablet by mouth daily.   Multiple Vitamins-Minerals (PRESERVISION AREDS 2) CAPS Take 2 each by mouth daily at 12 noon.   nitroGLYCERIN (NITROSTAT) 0.4 MG SL tablet Place 1 tablet (0.4 mg total)  under the tongue every 5 (five) minutes as needed for chest pain.   rosuvastatin (CRESTOR) 20 MG tablet Take 1 tablet (20 mg total) by mouth at bedtime.   sacubitril-valsartan (ENTRESTO) 24-26 MG Take 1 tablet by mouth 2 (two) times daily.   Tiotropium Bromide Monohydrate (SPIRIVA RESPIMAT) 2.5 MCG/ACT AERS Inhale 2 puffs into the lungs daily. INHALE 2 PUFFS INTO THE LUNGS EVERY DAY   No facility-administered encounter medications on file as of 06/26/2022.    Allergies (verified) Advair diskus [fluticasone-salmeterol], Levofloxacin, Qvar [beclomethasone], Singulair [montelukast], and Symbicort [budesonide-formoterol fumarate]   History: Past Medical History:  Diagnosis Date   Arthritis    Asthma    Cancer ([place]) 1990   COVID-19 06/14/2020   Depression    Past Surgical History:  Procedure Laterality Date   BREAST SURGERY Left 1990   CORONARY ARTERY BYPASS GRAFT     JOINT REPLACEMENT Right    Hip   LEFT HEART CATH AND CORS/GRAFTS ANGIOGRAPHY N/A 06/05/2022   Procedure: LEFT HEART CATH AND CORS/GRAFTS ANGIOGRAPHY;  Surgeon: Wellington Hampshire, MD;  Location: [facility];  Service: Cardiovascular;  Laterality: N/A;   OVARIAN CYST REMOVAL  1970   TUBAL LIGATION  1970   Family History  Problem Relation Age of Onset   Cancer Father        lung   Heart attack Father 26   Alcohol abuse Father    Arthritis Father    COPD Father    Diabetes Father    Hypertension Father    Breast cancer Sister 25   Arthritis Sister    Cancer Sister    COPD Sister    Drug abuse Sister    Hypertension Sister    41 / [place] Sister    Thyroid disease Sister    Arthritis Mother    COPD Mother    Diabetes Mother    Hearing loss Mother    Hypertension Mother    16 / [place] Mother    Stroke Mother    Alcohol abuse Brother    Arthritis Brother    Hypertension Brother    Arthritis Daughter    Asthma Daughter    Depression Daughter    Hypertension Daughter     45 / [place] Daughter    Thyroid disease Daughter    Arthritis Maternal Grandmother    Hypertension Maternal Grandmother    Stroke Maternal Grandmother    Thyroid disease Maternal Grandmother    Alcohol abuse Maternal Grandfather    Arthritis Maternal Grandfather    Cancer Maternal Grandfather    Depression Maternal Grandfather    Hearing loss Maternal Grandfather    Hypertension Maternal Grandfather    Arthritis Sister    Thyroid disease Sister    Thyroid disease Daughter    Hypertension Daughter    Depression Daughter    COPD Daughter    Alcohol abuse Daughter    Arthritis Daughter    Arthritis Daughter    COPD  Daughter    Depression Daughter    Hearing loss Daughter    Hypertension Daughter    Thyroid disease Daughter    Social History   Socioeconomic History   Marital status: Widowed    Spouse name: Not on file   Number of children: 3   Years of education: 2 associate degrees   Highest education level: Not on file  Occupational History   Not on file  Tobacco Use   Smoking status: Former    Packs/day: 1.00    Years: 40.00    Total pack years: 40.00    Types: Cigarettes    Quit date: 2002    Years since quitting: 22.1   Smokeless tobacco: Never  Vaping Use   Vaping Use: Never used  Substance and Sexual Activity   Alcohol use: Not Currently   Drug use: Never   Sexual activity: Not Currently  Other Topics Concern   Not on file  Social History Narrative   Moved from [place] with Daughter, [name]  (one daughter in [place] and the other in [place]) - 5 granddaughters and 2 great-grandchildren   Enjoys needle work, reading, cook   Exercise: not really, can walk 1/2 a mile at slow pace w/o getting SOB, SOB with stairs   Sleeps on the ground floor   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)   Social Determinants of Radio broadcast assistant Strain: [place]  (06/26/2022)   Overall Financial Resource Strain (CARDIA)    Difficulty of  Paying Living Expenses: Not hard at all  Food Insecurity: No Food Insecurity (06/26/2022)   Hunger Vital Sign    Worried About Running Out of Food in the Last Year: Never true    [place] in the Last Year: Never true  Transportation Needs: No Transportation Needs (06/26/2022)   PRAPARE - Hydrologist (Medical): No    Lack of Transportation (Non-Medical): No  Physical Activity: Inactive (06/26/2022)   Exercise Vital Sign    Days of Exercise per Week: 4 days    Minutes of Exercise per Session: 0 min  Stress: No Stress Concern Present (06/26/2022)   [place]    Feeling of Stress : Not at all  Social Connections: Moderately Isolated (06/26/2022)   Social Connection and Isolation Panel [NHANES]    Frequency of Communication with Friends and Family: Twice a week    Frequency of Social Gatherings with Friends and Family: More than three times a week    Attends Religious Services: More than 4 times per year    Active Member of [employer] or Organizations: No    Attends Archivist Meetings: Never    Marital Status: Widowed    Tobacco Counseling Counseling given: Not Answered   Clinical Intake:  Pre-visit preparation completed: Yes  Pain : No/denies pain Pain Score: 0-No pain     BMI - recorded: 30.04 Nutritional Risks: None Diabetes: No  How often do you need to have someone help you when you read instructions, pamphlets, or other written materials from your doctor or pharmacy?: 1 - Never  Diabetic?no  Interpreter Needed?: No  Information entered by :: C.Dejanira Pamintuan LPN   Activities of Daily Living    06/26/2022    9:34 AM 06/05/2022    1:39 PM  In your present state of health, do you have any difficulty performing the following activities:  Hearing? 1 0  Comment  wears aids   Vision? 1 0  Comment Macular degeneration followed by Dr.Matthews   Difficulty concentrating or  making decisions? 0 0  Walking or climbing stairs? 1 0  Comment Breathing issues, uses cane when out of home   Dressing or bathing? 0 0  Doing errands, shopping? 1 1  Comment Familiy assists with Agricultural engineer and eating ? Y   Comment Daughter assists   Using the Toilet? N   In the past six months, have you accidently leaked urine? Y   Comment Followed by PCP   Do you have problems with loss of bowel control? N   Managing your Medications? N   Comment Daughter assists   Managing your Finances? N   Housekeeping or managing your Housekeeping? N     Patient Care Team: Ria Bush, MD as PCP - General (Family Medicine) Freada Bergeron, MD as PCP - Cardiology (Cardiology) Margaretha Seeds, MD as Consulting Physician (Pulmonary Disease)  Indicate any recent Medical Services you may have received from other than [facility] providers in the past year (date may be approximate).     Assessment:   This is a routine wellness examination for [place].  Hearing/Vision screen Hearing Screening - Comments:: Wears aids  Vision Screening - Comments:: Wears readers - sees Dr.Matthews   Dietary issues and exercise activities discussed: Current Exercise Habits: The patient does not participate in regular exercise at present, Exercise limited by: cardiac condition(s);respiratory conditions(s) (COPD, Asthma, and s/p MI per patient)   Goals Addressed               This Visit's Progress     Patient Stated (pt-stated)        Be better than before in 2024.       Depression Screen    06/26/2022    9:24 AM 06/11/2022    9:05 AM 06/04/2022    1:01 PM 01/31/2022   12:26 PM 06/25/2021    9:55 AM 05/18/2020   12:03 PM 11/18/2019    3:34 PM  PHQ 2/9 Scores  PHQ - 2 Score 0 0 0 0 0 0 0  PHQ- 9 Score 0 '5 2 2   '$ 0    Fall Risk    06/26/2022    9:34 AM 06/11/2022    9:05 AM 06/04/2022    1:01 PM 06/25/2021    9:53 AM 05/18/2020   12:03 PM  Fall Risk   Falls in the past  year? 0 0 0 0 0  Number falls in past yr: 0   0 0  Injury with Fall? 0   0 0  Risk for fall due to : No Fall Risks   No Fall Risks   Follow up Falls prevention discussed;Falls evaluation completed   Falls prevention discussed Falls evaluation completed    FALL RISK PREVENTION PERTAINING TO THE HOME:  Any stairs in or around the home? Yes  If so, are there any without handrails? Yes  Home free of loose throw rugs in walkways, pet beds, electrical cords, etc? Yes  Adequate lighting in your home to reduce risk of falls? Yes   ASSISTIVE DEVICES UTILIZED TO PREVENT FALLS:  Life alert? No  Use of a cane, walker or w/c? Yes , uses cane when leaves the house. Grab bars in the bathroom? Yes  Shower chair or bench in shower? No  Elevated toilet seat or a handicapped toilet? No    Cognitive Function:    11/18/2019  3:38 PM  MMSE - Mini Mental State Exam  Not completed: Refused        06/26/2022    9:50 AM 11/11/2019    3:25 PM  6CIT Screen  What Year? 0 points 4 points  What month? 0 points   What time? 3 points   Count back from 20 0 points   Months in reverse 0 points   Repeat phrase 0 points   Total Score 3 points     Immunizations Immunization History  Administered Date(s) Administered   Influenza, High Dose Seasonal PF 04/07/2018   Influenza,inj,Quad PF,6+ Mos 05/19/2019   Pneumococcal Conjugate-13 11/09/2018   Pneumococcal Polysaccharide-23 08/15/2015   Tdap 08/15/2015   Zoster, Live 08/15/2015    TDAP status: Up to date  Flu Vaccine status: Declined, Education has been provided regarding the importance of this vaccine but patient still declined. Advised may receive this vaccine at local pharmacy or Health Dept. Aware to provide a copy of the vaccination record if obtained from local pharmacy or Health Dept. Verbalized acceptance and understanding.  Pneumococcal vaccine status: Up to date  Covid-19 vaccine status: Declined, Education has been provided regarding  the importance of this vaccine but patient still declined. Advised may receive this vaccine at local pharmacy or Health Dept.or vaccine clinic. Aware to provide a copy of the vaccination record if obtained from local pharmacy or Health Dept. Verbalized acceptance and understanding.  Qualifies for Shingles Vaccine? Yes   Zostavax completed Yes   Shingrix Completed?: No.    Education has been provided regarding the importance of this vaccine. Patient has been advised to call insurance company to determine out of pocket expense if they have not yet received this vaccine. Advised may also receive vaccine at local pharmacy or Health Dept. Verbalized acceptance and understanding.  Screening Tests Health Maintenance  Topic Date Due   COVID-19 Vaccine (1) Never done   Zoster Vaccines- Shingrix (1 of 2) Never done   Medicare Annual Wellness (AWV)  06/27/2023   DTaP/Tdap/Td (2 - Td or Tdap) 08/14/2025   Pneumonia Vaccine 89+ Years old  Completed   HPV VACCINES  Aged Out   INFLUENZA VACCINE  Discontinued   DEXA SCAN  Discontinued    Health Maintenance  Health Maintenance Due  Topic Date Due   COVID-19 Vaccine (1) Never done   Zoster Vaccines- Shingrix (1 of 2) Never done    Colorectal cancer screening: No longer required. Patient declined.  Mammogram status: No longer required due to age. Patient declined.  Bone Density Status: not done, patient declined.  Lung Cancer Screening: (Low Dose CT Chest recommended if Age 31-80 years, 30 pack-year currently smoking OR have quit w/in 15years.) does not qualify.   Lung Cancer Screening Referral: no  Additional Screening:  Hepatitis C Screening: does not qualify; Completed does not qualify.  Vision Screening: Recommended annual ophthalmology exams for early detection of glaucoma and other disorders of the eye. Is the patient up to date with their annual eye exam?  Yes  Who is the provider or what is the name of the office in which the patient  attends annual eye exams? Dr.Matthew If pt is not established with a provider, would they like to be referred to a provider to establish care? No .   Dental Screening: Recommended annual dental exams for proper oral hygiene  Community Resource Referral / Chronic Care Management: CRR required this visit?  No   CCM required this visit?  No  Plan:     I have personally reviewed and noted the following in the patient's chart:   Medical and social history Use of alcohol, tobacco or illicit drugs  Current medications and supplements including opioid prescriptions. Patient is not currently taking opioid prescriptions. Functional ability and status Nutritional status Physical activity Advanced directives List of other physicians Hospitalizations, surgeries, and ER visits in previous 12 months Vitals Screenings to include cognitive, depression, and falls Referrals and appointments  In addition, I have reviewed and discussed with patient certain preventive protocols, quality metrics, and best practice recommendations. A written personalized care plan for preventive services as well as general preventive health recommendations were provided to patient.     Lebron Conners, LPN   0/05/4101   Nurse Notes: Per pt, starting Cardiac rehab soon. Pt declined mammogram, colonoscopy, and Dexa scan.

## 2022-06-26 NOTE — Patient Instructions (Signed)
Nancy Merritt , Thank you for taking time to come for your Medicare Wellness Visit. I appreciate your ongoing commitment to your health goals. Please review the following plan we discussed and let me know if I can assist you in the future.   These are the goals we discussed:  Goals       Patient Stated      11/18/2019, I will continue to walk on my treadmill for 3 days a week for 15 minutes.       Patient Stated      Would like to maintain current routine and add exercise      Patient Stated (pt-stated)      Be better than before in 2024.        This is a list of the screening recommended for you and due dates:  Health Maintenance  Topic Date Due   COVID-19 Vaccine (1) Never done   Zoster (Shingles) Vaccine (1 of 2) Never done   Medicare Annual Wellness Visit  06/27/2023   DTaP/Tdap/Td vaccine (2 - Td or Tdap) 08/14/2025   Pneumonia Vaccine  Completed   HPV Vaccine  Aged Out   Flu Shot  Discontinued   DEXA scan (bone density measurement)  Discontinued    Advanced directives: Yes, in chart  Conditions/risks identified: Sedentary lifestyle  Next appointment: Follow up in one year for your annual wellness visit 07/01/2023 @ 9:00am via telephone.   Preventive Care 53 Years and Older, Female Preventive care refers to lifestyle choices and visits with your health care provider that can promote health and wellness. What does preventive care include? A yearly physical exam. This is also called an annual well check. Dental exams once or twice a year. Routine eye exams. Ask your health care provider how often you should have your eyes checked. Personal lifestyle choices, including: Daily care of your teeth and gums. Regular physical activity. Eating a healthy diet. Avoiding tobacco and drug use. Limiting alcohol use. Practicing safe sex. Taking low-dose aspirin every day. Taking vitamin and mineral supplements as recommended by your health care provider. What happens during an  annual well check? The services and screenings done by your health care provider during your annual well check will depend on your age, overall health, lifestyle risk factors, and family history of disease. Counseling  Your health care provider may ask you questions about your: Alcohol use. Tobacco use. Drug use. Emotional well-being. Home and relationship well-being. Sexual activity. Eating habits. History of falls. Memory and ability to understand (cognition). Work and work Statistician. Reproductive health. Screening  You may have the following tests or measurements: Height, weight, and BMI. Blood pressure. Lipid and cholesterol levels. These may be checked every 5 years, or more frequently if you are over 17 years old. Skin check. Lung cancer screening. You may have this screening every year starting at age 53 if you have a 30-pack-year history of smoking and currently smoke or have quit within the past 15 years. Fecal occult blood test (FOBT) of the stool. You may have this test every year starting at age 93. Flexible sigmoidoscopy or colonoscopy. You may have a sigmoidoscopy every 5 years or a colonoscopy every 10 years starting at age 69. Hepatitis C blood test. Hepatitis B blood test. Sexually transmitted disease (STD) testing. Diabetes screening. This is done by checking your blood sugar (glucose) after you have not eaten for a while (fasting). You may have this done every 1-3 years. Bone density scan. This is done  to screen for osteoporosis. You may have this done starting at age 15. Mammogram. This may be done every 1-2 years. Talk to your health care provider about how often you should have regular mammograms. Talk with your health care provider about your test results, treatment options, and if necessary, the need for more tests. Vaccines  Your health care provider may recommend certain vaccines, such as: Influenza vaccine. This is recommended every year. Tetanus,  diphtheria, and acellular pertussis (Tdap, Td) vaccine. You may need a Td booster every 10 years. Zoster vaccine. You may need this after age 32. Pneumococcal 13-valent conjugate (PCV13) vaccine. One dose is recommended after age 73. Pneumococcal polysaccharide (PPSV23) vaccine. One dose is recommended after age 34. Talk to your health care provider about which screenings and vaccines you need and how often you need them. This information is not intended to replace advice given to you by your health care provider. Make sure you discuss any questions you have with your health care provider. Document Released: 06/02/2015 Document Revised: 01/24/2016 Document Reviewed: 03/07/2015 Elsevier Interactive Patient Education  2017 [place] Prevention in the Home Falls can cause injuries. They can happen to people of all ages. There are many things you can do to make your home safe and to help prevent falls. What can I do on the outside of my home? Regularly fix the edges of walkways and driveways and fix any cracks. Remove anything that might make you trip as you walk through a door, such as a raised step or threshold. Trim any bushes or trees on the path to your home. Use bright outdoor lighting. Clear any walking paths of anything that might make someone trip, such as rocks or tools. Regularly check to see if handrails are loose or broken. Make sure that both sides of any steps have handrails. Any raised decks and porches should have guardrails on the edges. Have any leaves, snow, or ice cleared regularly. Use sand or salt on walking paths during winter. Clean up any spills in your garage right away. This includes oil or grease spills. What can I do in the bathroom? Use night lights. Install grab bars by the toilet and in the tub and shower. Do not use towel bars as grab bars. Use non-skid mats or decals in the tub or shower. If you need to sit down in the shower, use a plastic,  non-slip stool. Keep the floor dry. Clean up any water that spills on the floor as soon as it happens. Remove soap buildup in the tub or shower regularly. Attach bath mats securely with double-sided non-slip rug tape. Do not have throw rugs and other things on the floor that can make you trip. What can I do in the bedroom? Use night lights. Make sure that you have a light by your bed that is easy to reach. Do not use any sheets or blankets that are too big for your bed. They should not hang down onto the floor. Have a firm chair that has side arms. You can use this for support while you get dressed. Do not have throw rugs and other things on the floor that can make you trip. What can I do in the kitchen? Clean up any spills right away. Avoid walking on wet floors. Keep items that you use a lot in easy-to-reach places. If you need to reach something above you, use a strong step stool that has a grab bar. Keep electrical cords out of the  way. Do not use floor polish or wax that makes floors slippery. If you must use wax, use non-skid floor wax. Do not have throw rugs and other things on the floor that can make you trip. What can I do with my stairs? Do not leave any items on the stairs. Make sure that there are handrails on both sides of the stairs and use them. Fix handrails that are broken or loose. Make sure that handrails are as long as the stairways. Check any carpeting to make sure that it is firmly attached to the stairs. Fix any carpet that is loose or worn. Avoid having throw rugs at the top or bottom of the stairs. If you do have throw rugs, attach them to the floor with carpet tape. Make sure that you have a light switch at the top of the stairs and the bottom of the stairs. If you do not have them, ask someone to add them for you. What else can I do to help prevent falls? Wear shoes that: Do not have high heels. Have rubber bottoms. Are comfortable and fit you well. Are closed  at the toe. Do not wear sandals. If you use a stepladder: Make sure that it is fully opened. Do not climb a closed stepladder. Make sure that both sides of the stepladder are locked into place. Ask someone to hold it for you, if possible. Clearly mark and make sure that you can see: Any grab bars or handrails. First and last steps. Where the edge of each step is. Use tools that help you move around (mobility aids) if they are needed. These include: Canes. Walkers. Scooters. Crutches. Turn on the lights when you go into a dark area. Replace any light bulbs as soon as they burn out. Set up your furniture so you have a clear path. Avoid moving your furniture around. If any of your floors are uneven, fix them. If there are any pets around you, be aware of where they are. Review your medicines with your doctor. Some medicines can make you feel dizzy. This can increase your chance of falling. Ask your doctor what other things that you can do to help prevent falls. This information is not intended to replace advice given to you by your health care provider. Make sure you discuss any questions you have with your health care provider. Document Released: 03/02/2009 Document Revised: 10/12/2015 Document Reviewed: 06/10/2014 Elsevier Interactive Patient Education  2017 [employer].

## 2022-06-28 ENCOUNTER — Encounter: Payer: Self-pay | Admitting: Physician Assistant

## 2022-06-28 ENCOUNTER — Ambulatory Visit: Payer: PPO | Attending: Physician Assistant | Admitting: Physician Assistant

## 2022-06-28 VITALS — BP 124/86 | HR 73 | Ht 61.0 in | Wt 162.6 lb

## 2022-06-28 DIAGNOSIS — I214 Non-ST elevation (NSTEMI) myocardial infarction: Secondary | ICD-10-CM

## 2022-06-28 DIAGNOSIS — I639 Cerebral infarction, unspecified: Secondary | ICD-10-CM

## 2022-06-28 DIAGNOSIS — Z853 Personal history of malignant neoplasm of breast: Secondary | ICD-10-CM

## 2022-06-28 DIAGNOSIS — J449 Chronic obstructive pulmonary disease, unspecified: Secondary | ICD-10-CM | POA: Diagnosis not present

## 2022-06-28 DIAGNOSIS — I161 Hypertensive emergency: Secondary | ICD-10-CM | POA: Diagnosis not present

## 2022-06-28 DIAGNOSIS — F339 Major depressive disorder, recurrent, unspecified: Secondary | ICD-10-CM | POA: Diagnosis not present

## 2022-06-28 DIAGNOSIS — I5041 Acute combined systolic (congestive) and diastolic (congestive) heart failure: Secondary | ICD-10-CM

## 2022-06-28 NOTE — Progress Notes (Signed)
Office Visit    Patient Name: Nancy Merritt Date of Encounter: 06/28/2022  PCP:  Ria Bush, [facility]  Cardiologist:  Freada Bergeron, MD  Advanced Practice Provider:  No care team member to display Electrophysiologist:  None   HPI    Georgeanna Nalle is a 82 y.o. 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  He  Patient suffered a stroke when she was visiting her grandson in [place] 04/2020.  Specifically she developed speech difficulties, seemed "out of it", and was told that she was drooling.  MRI confirmed acute stroke.  CTA of head and neck without acute findings (per report).  TTE was performed but unknown results.  No known arrhythmias.  Symptoms fortunately resolved.  She was started on ASA 81 mg and atorvastatin 40 mg daily.  Blood pressure had been well-controlled at home on lisinopril.  Following her stroke, patient developed COVID in 05/2020.  Note that her O2 sats were as low as 84% with associated fatigue prompting her to go to the ER.  Now requiring supplemental oxygen due to underlying asthma.  She remains on oxygen now.  She also history of MI found to have multivessel CAD status post CABG (four-vessel) in [place] 13 years ago.  She was seen 07/09/2020 and a 30-day monitor was ordered to assess atrial fibrillation given recent CVA which was negative.  We recommended her to see EP but she declined at that time and wanted to discuss loop further.  We also obtain a TTE 08/09/2020 which revealed LVEF 65%, grade 1 DD, normal RV and systolic function, mild aortic sclerosis, positive PFO but unlikely to be the source of the CVA.  She was seen 08/2020 by Dr. Johney Frame and stated she was feeling a lot better.  Stroke symptoms had almost completely improved  however she was still having some visual blurriness.  She only required 1 L of oxygen during the day intermittently and continuously at night.  No complaints of shortness of breath, palpitations, chest pain/tightness or pressure at that time.  Continued to take her medications that he side effects.  She has become physically more active after resampling her treadmill.  Walks for several minutes 3 times daily.  She presented to the hospital a few weeks ago with a chief complaint of chest pain which she described as burning and heaviness.  She stated it was similar to when she had a prior MI.  Troponin noted to be 553 and then 574.  EKG was nonacute.  Cardiac catheterization was done revealed severe coronary artery disease unchanged from previous cath and also was suspicious for volume overload.  Troponin elevation may have been secondary to hypertensive emergency.  She was started on carvedilol, Entresto, spironolactone.  Spironolactone was then stopped and Coreg dose reduced due to hypotension.  Follow-up Plavix 75 mg and ASA 81 mg.  Today, she tells me that she has not had any more chest pains and is overall feeling better.  She has been suffering with cough.  Sometimes productive.  Sometimes dry.  We recommended Mucinex without the decongestant.  She tells me she has not been holding onto any fluid and her weight is stable.  She was started on hydroxyurea for her elevated platelet counts.  She does have fatigue positivity from this medication.  She has shortness of breath on exertion but none at rest.  We discussed repeating an echocardiogram prior to her appointment with Dr. Johney Frame.   Reports no chest pain, pressure, or tightness. No edema, orthopnea, PND. Reports no palpitations.    Past Medical History    Past Medical History:  Diagnosis Date   Arthritis    Asthma    Cancer ([place]) 1990   COVID-19 06/14/2020   Depression    Past Surgical History:  Procedure Laterality Date   BREAST SURGERY  Left 1990   CORONARY ARTERY BYPASS GRAFT     JOINT REPLACEMENT Right    Hip   LEFT HEART CATH AND CORS/GRAFTS ANGIOGRAPHY N/A 06/05/2022   Procedure: LEFT HEART CATH AND CORS/GRAFTS ANGIOGRAPHY;  Surgeon: Wellington Hampshire, MD;  Location: [facility];  Service: Cardiovascular;  Laterality: N/A;   OVARIAN CYST REMOVAL  1970   TUBAL LIGATION  1970    Allergies  Allergies  Allergen Reactions   Advair Diskus [Fluticasone-Salmeterol] Shortness Of Breath    As of 09/01/2014. Pt can take Breo   Levofloxacin Other (See Comments)    Nausea, brain fog, depression, as of 12/12/14   Qvar [Beclomethasone] Other (See Comments)    Lungs closing-as of 05/03/2015   Singulair [Montelukast] Shortness Of Breath   Symbicort [Budesonide-Formoterol Fumarate] Other (See Comments)    Hard time breathing as of 10/31/2014.    EKGs/Labs/Other Studies Reviewed:   The following studies were reviewed today:  Cardiac catheterization 06/05/2022  Left Anterior Descending  Prox LAD to Mid LAD lesion is 70% stenosed.  Mid LAD lesion is 100% stenosed.    First Diagonal Branch  1st Diag lesion is 100% stenosed.    Left Circumflex  Prox Cx to Mid Cx lesion is 100% stenosed.    First Obtuse Marginal Branch  Vessel is small in size.    Right Coronary Artery  Prox RCA lesion is 80% stenosed.  Mid RCA lesion is 100% stenosed.    Right Posterior Atrioventricular Artery  RPAV lesion is 50% stenosed.    LIMA LIMA Graft To Dist LAD  LIMA graft was visualized by non-selective angiography and is normal in caliber. The graft exhibits no disease.    Graft To RPDA  Origin to Prox Graft lesion is 30% stenosed.    Saphenous Graft To 3rd Diag  SVG graft was visualized by angiography and is normal in caliber. The graft exhibits no disease.    Saphenous Graft To Dist Cx  SVG graft was visualized by angiography. The graft exhibits mild .    Intervention   No interventions have been documented.   Left  Heart  Left Ventricle The left ventricular size is normal. The left ventricular systolic function is normal. LV end diastolic pressure is moderately elevated. The left ventricular ejection fraction is 45-50% by visual estimate. No regional wall motion abnormalities.   Coronary Diagrams  Diagnostic Dominance: Right  Intervention   Implants   No implant documentation for this case.    Echo 06/06/22 IMPRESSIONS     1. Left ventricular ejection fraction, by estimation, is 60 to 65%. The  left ventricle has normal function. The left ventricle has no regional  wall motion abnormalities. There is mild concentric left ventricular  hypertrophy. Left ventricular diastolic  parameters are consistent with Grade I diastolic dysfunction (impaired  relaxation).   2. Right ventricular systolic function is normal. The right ventricular  size is normal. Tricuspid regurgitation signal is inadequate for assessing  PA pressure.   3. No evidence of mitral valve regurgitation.   4. There is moderate calcification of the aortic valve. Aortic valve  regurgitation is not visualized.   5. The inferior vena cava is normal in size with greater than 50%  respiratory variability, suggesting right atrial pressure of 3 mmHg.   Comparison(s): No significant change from prior study.   FINDINGS   Left Ventricle: Left ventricular ejection fraction, by estimation, is 60  to 65%. The left ventricle has normal function. The left ventricle has no  regional wall motion abnormalities. The left ventricular internal cavity  size was normal in size. There is   mild concentric left ventricular hypertrophy. Left ventricular diastolic  parameters are consistent with Grade I diastolic dysfunction (impaired  relaxation).   Right Ventricle: The right ventricular size is normal. Right ventricular  systolic function is normal. Tricuspid regurgitation signal is inadequate  for assessing PA pressure.   Left Atrium: Left  atrial size was normal in size.   Right Atrium: Right atrial size was normal in size.   Pericardium: There is no evidence of pericardial effusion.   Mitral Valve: No evidence of mitral valve regurgitation.   Tricuspid Valve: Tricuspid valve regurgitation is not demonstrated.   Aortic Valve: There is moderate calcification of the aortic valve. Aortic  valve regurgitation is not visualized.   Pulmonic Valve: Pulmonic valve regurgitation is not visualized.   Aorta: The aortic root and ascending aorta are structurally normal, with  no evidence of dilitation.   Venous: The inferior vena cava is normal in size with greater than 50%  respiratory variability, suggesting right atrial pressure of 3 mmHg.   IAS/Shunts: The interatrial septum was not well visualized.     LEFT VENTRICLE  PLAX 2D  LVIDd:         3.50 cm     Diastology  LVIDs:         2.30 cm     LV e' medial:    4.95 cm/s   EKG:  EKG is not ordered today.    Recent Labs: 01/31/2022: ALT 12; TSH 2.21 06/06/2022: BUN 20; Creatinine, Ser 0.93; Potassium 3.9; Sodium 139 06/20/2022: Hemoglobin 12.9; Platelets 1,425  Recent Lipid Panel    Component Value Date/Time   CHOL 159 07/30/2021 1524   CHOL 158 07/17/2020 1213   TRIG 85.0 07/30/2021 1524   HDL 44.70 07/30/2021 1524   HDL 53 07/17/2020 1213   CHOLHDL 4 07/30/2021 1524   VLDL 17.0 07/30/2021 1524   LDLCALC 97 07/30/2021 1524   LDLCALC 87 07/17/2020 1213    Home Medications   Current Meds  Medication Sig   acetaminophen (TYLENOL) 500 MG tablet Take 1 tablet (500 mg total) by mouth 3 (three) times daily as needed for moderate pain.   albuterol (PROVENTIL) (2.5 MG/3ML) 0.083% nebulizer solution Take 3 mLs (2.5 mg total) by nebulization every 6 (six) hours as needed for shortness of breath.   albuterol (VENTOLIN HFA) 108 (90 Base) MCG/ACT inhaler Inhale 2 puffs into the lungs every 4 (four) hours as needed for wheezing or shortness of breath.   aspirin 81 MG chewable  tablet Chew 1 tablet (81 mg total) by mouth daily.   carvedilol (COREG) 6.25 MG tablet Take 1 tablet (6.25 mg total) by mouth 2 (two) times daily with a meal.   clopidogrel (PLAVIX) 75 MG tablet Take 1 tablet (75 mg total) by mouth daily with breakfast.   ezetimibe (ZETIA) 10 MG tablet Take 1 tablet (  10 mg total) by mouth daily.   fluticasone furoate-vilanterol (BREO ELLIPTA) 200-25 MCG/ACT AEPB Inhale 1 puff into the lungs daily.   hydroxyurea (HYDREA) 500 MG capsule Take 1 capsule (500 mg total) by mouth daily. May take with food to minimize GI side effects.   loratadine (CLARITIN) 10 MG tablet Take 10 mg by mouth daily.   Multiple Vitamin (MULTIVITAMIN) tablet Take 1 tablet by mouth daily.   Multiple Vitamins-Minerals (PRESERVISION AREDS 2) CAPS Take 2 each by mouth daily at 12 noon.   nitroGLYCERIN (NITROSTAT) 0.4 MG SL tablet Place 1 tablet (0.4 mg total) under the tongue every 5 (five) minutes as needed for chest pain.   rosuvastatin (CRESTOR) 20 MG tablet Take 1 tablet (20 mg total) by mouth at bedtime.   sacubitril-valsartan (ENTRESTO) 24-26 MG Take 1 tablet by mouth 2 (two) times daily.   Tiotropium Bromide Monohydrate (SPIRIVA RESPIMAT) 2.5 MCG/ACT AERS Inhale 2 puffs into the lungs daily. INHALE 2 PUFFS INTO THE LUNGS EVERY DAY     Review of Systems      All other systems reviewed and are otherwise negative except as noted above.  Physical Exam    VS:  BP 124/86   Pulse 73   Ht 5' 1"$  (1.549 m)   Wt 162 lb 9.6 oz (73.8 kg)   SpO2 94%   BMI 30.72 kg/m  , BMI Body mass index is 30.72 kg/m.  Wt Readings from Last 3 Encounters:  06/28/22 162 lb 9.6 oz (73.8 kg)  06/26/22 165 lb (74.8 kg)  06/20/22 164 lb 9.6 oz (74.7 kg)     GEN: Well nourished, well developed, in no acute distress. HEENT: normal. Neck: Supple, no JVD, carotid bruits, or masses. Cardiac: RRR, no murmurs, rubs, or gallops. No clubbing, cyanosis, trace LE edema.  Radials/PT 2+ and equal bilaterally.   Respiratory:  Respirations regular and unlabored, clear to auscultation bilaterally. GI: Soft, nontender, nondistended. MS: No deformity or atrophy. Skin: Warm and dry, no rash. Neuro:  Strength and sensation are intact. Psych: Normal affect.  Assessment & Plan    NSTEMI/CAD s/p CABG -cardiac cath reviewed with no occlusion of her grafts from her CABG -Continue current medication regimen including aspirin 81 mg, Plavix 75 mg, Coreg 6.25 mg, Zetia 10 mg, nitro as needed, Crestor 20 mg,  Entresto 24-26 mg  -will order a f/u echo in April  Hypertensive emergency -Blood pressure well-controlled today -Continue current medication regimen -Continue to monitor blood pressure closely at home  Acute combined systolic and diastolic heart failure -Continue GDMT as listed above -Will plan for follow-up echocardiogram in a few months -Euvolemic on exam today other than some trace edema -Weight has been stable, continue to track at home  History of CVA -no residual effects        Cardiac Rehabilitation Eligibility Assessment  The patient is ready to start cardiac rehabilitation from a cardiac standpoint.   Disposition: Follow up 3 months with Freada Bergeron, MD or APP.  Signed, Elgie Collard, PA-C 06/28/2022, 5:03 PM [facility]

## 2022-06-28 NOTE — Patient Instructions (Signed)
Medication Instructions:  Your physician recommends that you continue on your current medications as directed. Please refer to the Current Medication list given to you today.  *If you need a refill on your cardiac medications before your next appointment, please call your pharmacy*   Lab Work: None ordered If you have labs (blood work) drawn today and your tests are completely normal, you will receive your results only by: [place] (if you have MyChart) OR A paper copy in the mail If you have any lab test that is abnormal or we need to change your treatment, we will call you to review the results.   Testing/Procedures: Your physician has requested that you have an echocardiogram a few days prior to your 09/04/22 appointment with Dr Johney Frame. Echocardiography is a painless test that uses sound waves to create images of your heart. It provides your doctor with information about the size and shape of your heart and how well your heart's chambers and valves are working. This procedure takes approximately one hour. There are no restrictions for this procedure. Please do NOT wear cologne, perfume, aftershave, or lotions (deodorant is allowed). Please arrive 15 minutes prior to your appointment time.    Follow-Up: At [facility], you and your health needs are our priority.  As part of our continuing mission to provide you with exceptional heart care, we have created designated Provider Care Teams.  These Care Teams include your primary Cardiologist (physician) and Advanced Practice Providers (APPs -  Physician Assistants and Nurse Practitioners) who all work together to provide you with the care you need, when you need it.   Your next appointment:   09/04/22 at 3:00 PM  Provider:   Freada Bergeron, MD

## 2022-07-02 IMAGING — US US ABDOMEN LIMITED
1 series · 14 of 25 positions shown · non-contrast
Comparison: None.

CLINICAL DATA: Elevated LFTs.

EXAM:
ULTRASOUND ABDOMEN LIMITED RIGHT UPPER QUADRANT

[Series 1: us abdomen limited · 0.22mm/px · 14 of 44 slices shown]
[im 1/44]
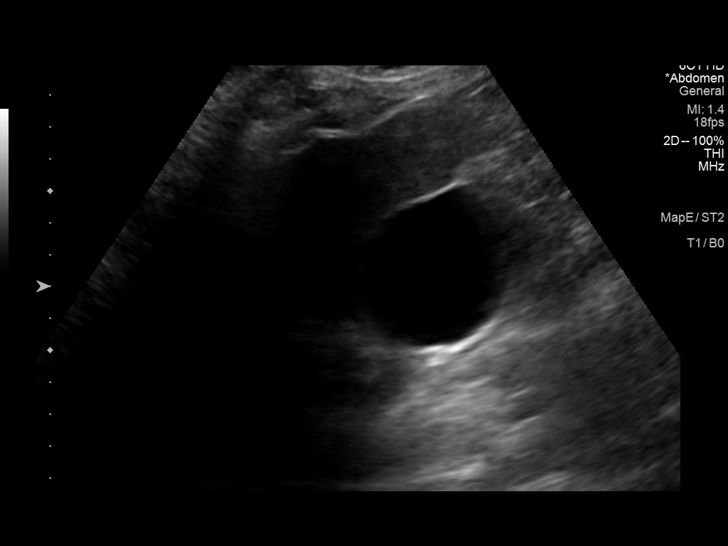
[im 4/44]
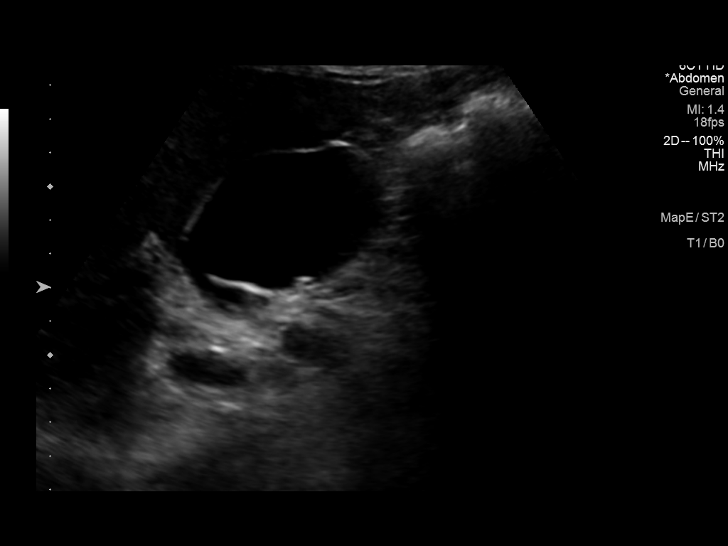
[im 8/44]
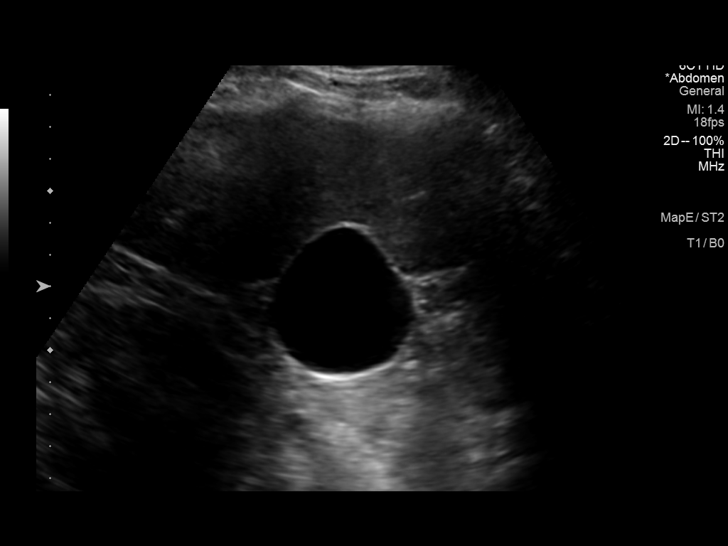
[im 11/44]
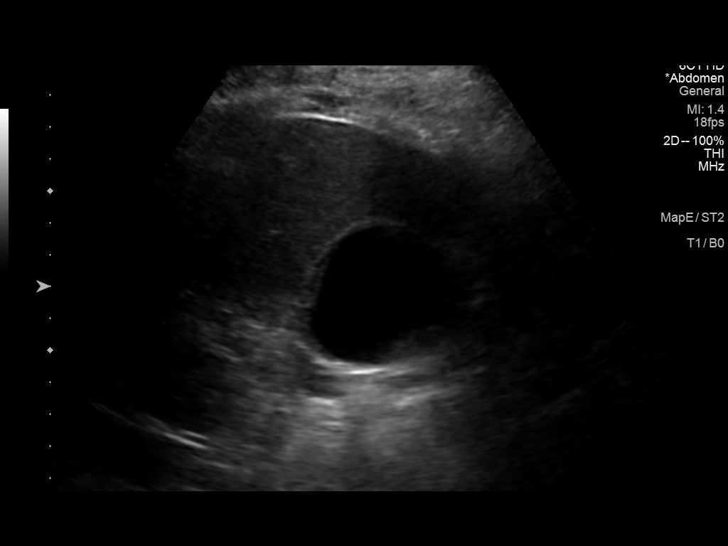
[im 15/44]
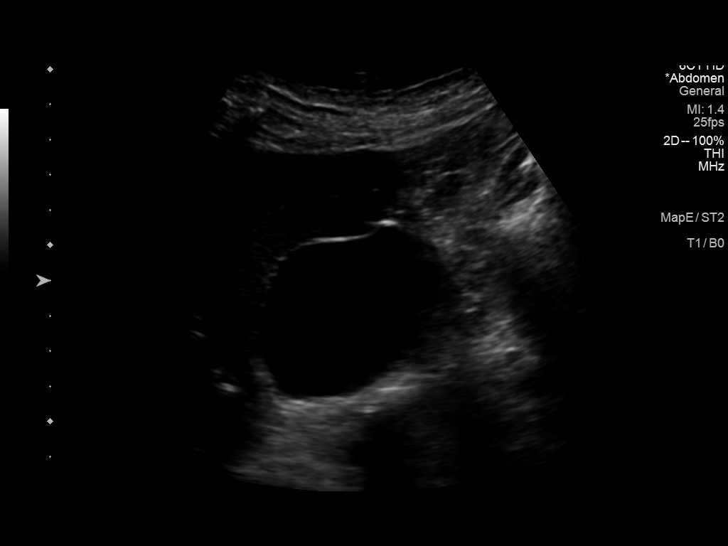
[im 17/44]
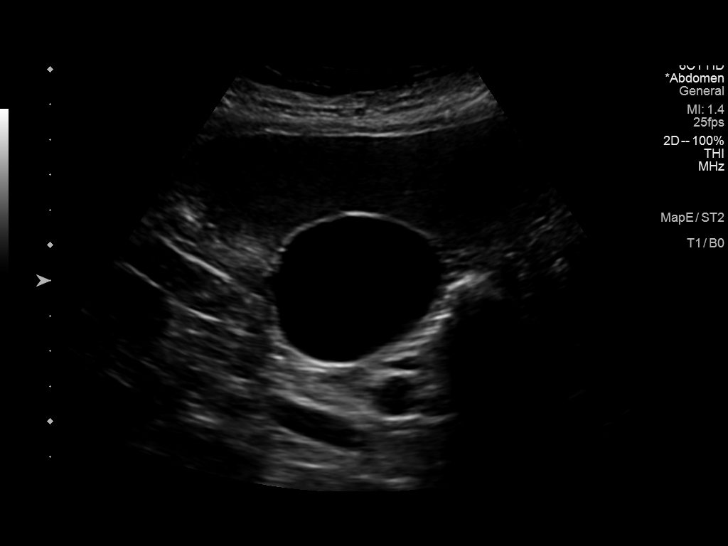
[im 20/44]
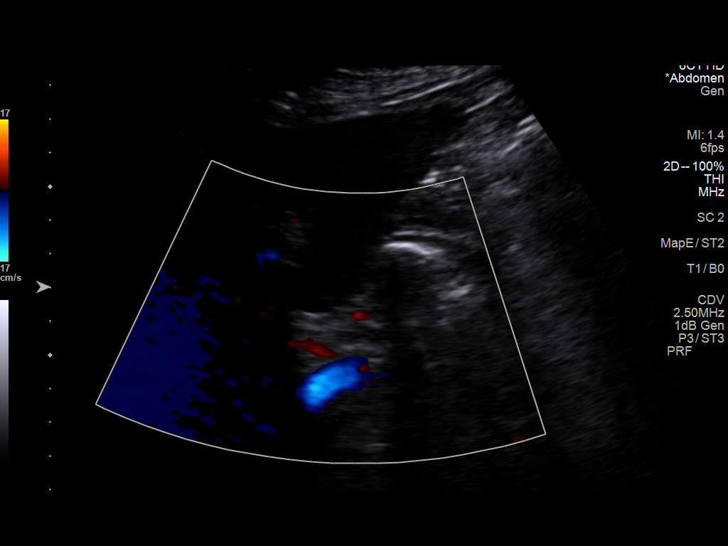
[im 24/44]
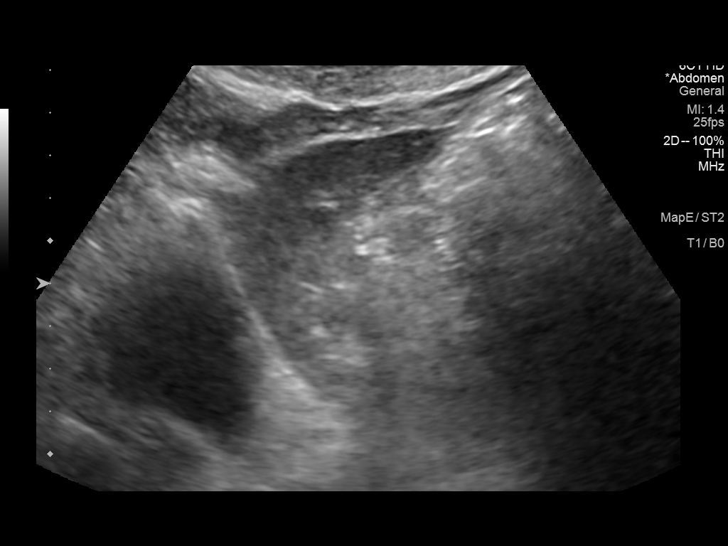
[im 27/44]
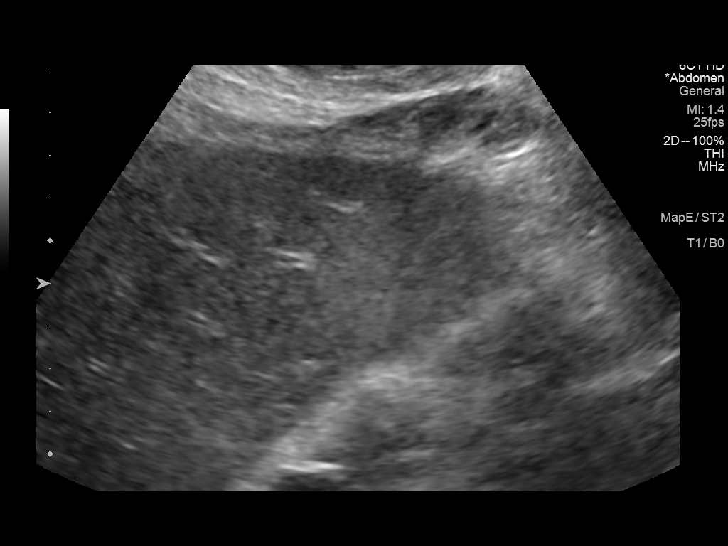
[im 29/44]
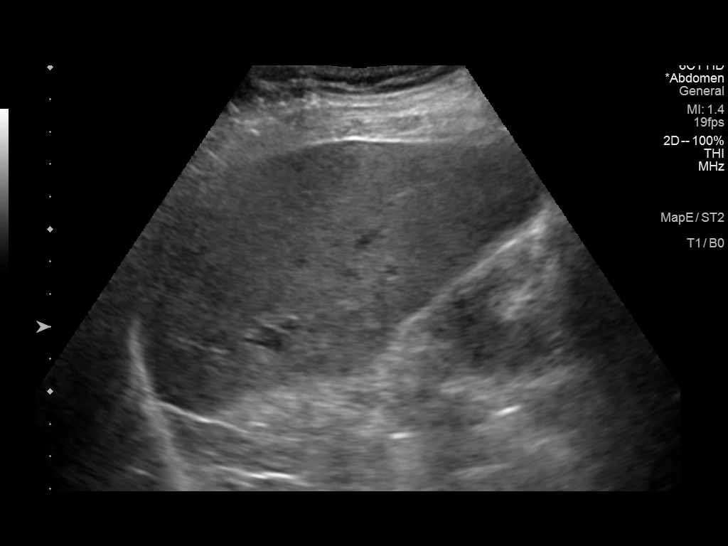
[im 33/44]
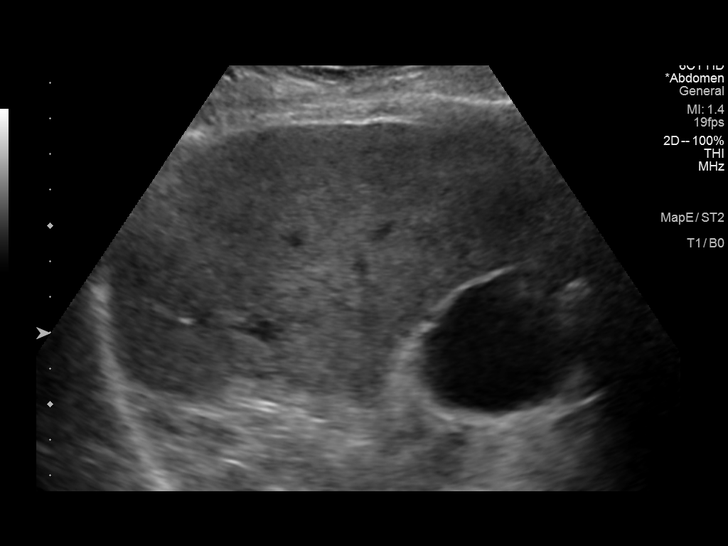
[im 36/44]
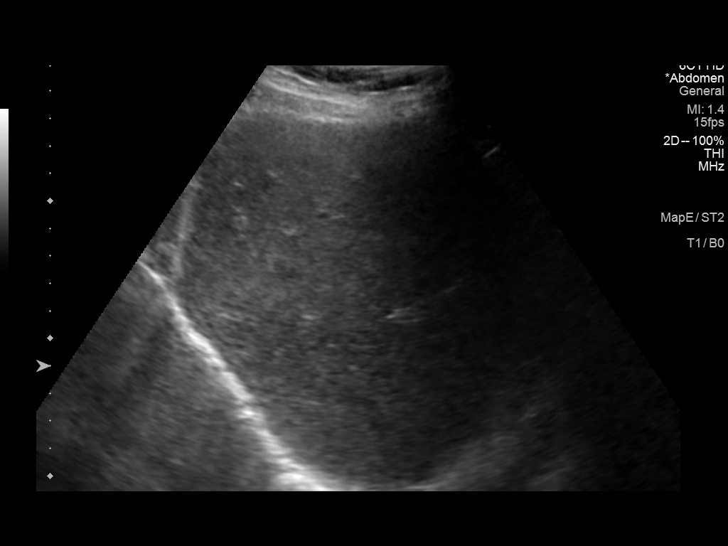
[im 40/44]
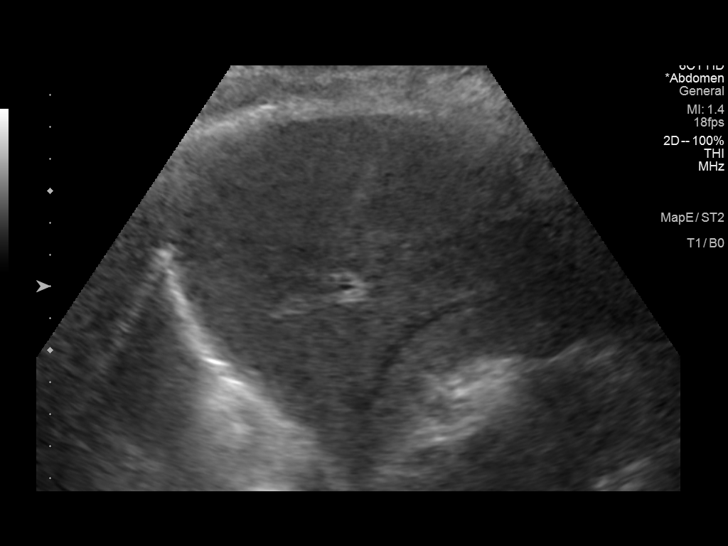
[im 44/44]
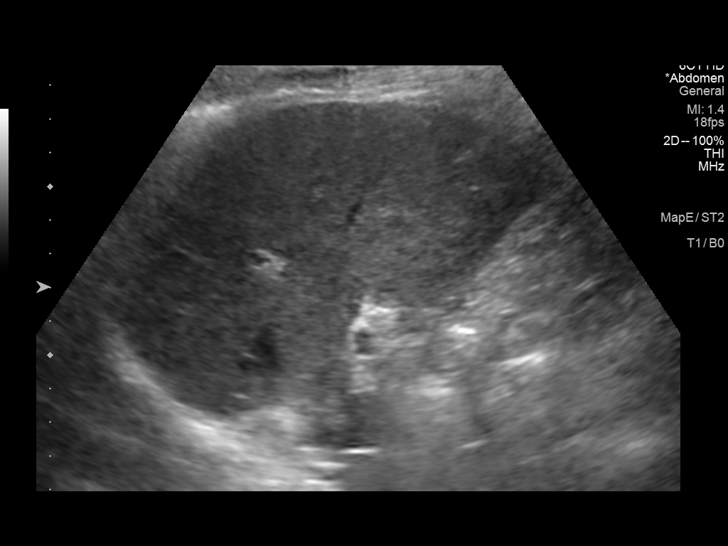

[14 of 25 positions shown; findings below may reference images not displayed]

FINDINGS: Gallbladder:

Physiologically distended. Echogenic 5 mm focus adherent to the
gallbladder wall without shadowing is most consistent polyp. No
gallstones. No wall thickening. No sonographic Murphy sign noted by
sonographer.

Common bile duct:

Diameter: 6 mm, normal.

Liver:

No focal lesion identified. Within normal limits in parenchymal
echogenicity. Portal vein is patent on color Doppler imaging with
normal direction of blood flow towards the liver.

Other: No right upper quadrant ascites.
IMPRESSION: 1. Normal sonographic appearance of the liver. No explanation for
elevated LFTs.
2. Gallbladder polyp measuring 5 mm. Given size, no dedicated
further imaging or follow-up is needed.
3. No biliary dilatation.

## 2022-07-09 DIAGNOSIS — H5203 Hypermetropia, bilateral: Secondary | ICD-10-CM | POA: Diagnosis not present

## 2022-07-09 DIAGNOSIS — H52223 Regular astigmatism, bilateral: Secondary | ICD-10-CM | POA: Diagnosis not present

## 2022-07-09 DIAGNOSIS — Z9841 Cataract extraction status, right eye: Secondary | ICD-10-CM | POA: Diagnosis not present

## 2022-07-09 DIAGNOSIS — Z9842 Cataract extraction status, left eye: Secondary | ICD-10-CM | POA: Diagnosis not present

## 2022-07-13 ENCOUNTER — Other Ambulatory Visit: Payer: Self-pay | Admitting: Oncology

## 2022-07-15 NOTE — Telephone Encounter (Signed)
CBC with Differential/Platelet Order: [id number] Status: Final result     Visible to patient: Yes (not seen)     Next appt: 07/25/2022 at 10:15 AM in Oncology ([facility])     Dx: Thrombocytosis   0 Result Notes           Component Ref Range & Units 3 wk ago (06/20/22) 1 mo ago (06/05/22) 1 mo ago (06/04/22) 5 mo ago (01/31/22) 9 mo ago (10/11/21) 10 mo ago (09/13/21) 11 mo ago (08/06/21)  WBC 4.0 - 10.5 K/uL 10.6 High  8.0 10.4 7.5 9.7 9.3 8.2  RBC 3.87 - 5.11 MIL/uL 4.38 3.90 4.13 4.41 R 4.01 4.14 4.45  Hemoglobin 12.0 - 15.0 g/dL 12.9 11.6 Low  12.2 13.7 12.9 12.8 13.3  HCT 36.0 - 46.0 % 39.0 35.4 Low  38.1 40.8 38.7 39.1 40.1  MCV 80.0 - 100.0 fL 89.0 90.8 92.3 92.5 R 96.5 94.4 90.1  MCH 26.0 - 34.0 pg 29.5 29.7 29.5  32.2 30.9 29.9  MCHC 30.0 - 36.0 g/dL 33.1 32.8 32.0 33.5 33.3 32.7 33.2  RDW 11.5 - 15.5 % 15.8 High  15.8 High  16.0 High  14.9 18.4 High  18.8 High  14.8  Platelets 150 - 400 K/uL 1,425 High Panic  1,173 High Panic  CM 1,377 High Panic  CM 1423.0 Repeated and verified X2. High Panic  R 982 High Panic  CM 1,092 High Panic  CM 1,342 High Panic  CM  Comment: REPEATED TO VERIFY THIS CRITICAL RESULT HAS VERIFIED AND BEEN CALLED TO DR.Hillcrest ON 02 01 2024 AT 1216, AND HAS BEEN READ BACK.  nRBC 0.0 - 0.2 % 0.0 0.0 CM 0.0 CM  0.0 0.0 0.0  Neutrophils Relative % % 69    64 68 61  Neutro Abs 1.7 - 7.7 K/uL 7.5    6.2 6.3 5.0  Lymphocytes Relative % '18    27 24 27  '$ Lymphs Abs 0.7 - 4.0 K/uL 1.9    2.6 2.2 2.2  Monocytes Relative % '7    6 5 7  '$ Monocytes Absolute 0.1 - 1.0 K/uL 0.7    0.6 0.5 0.6  Eosinophils Relative % '3    3 2 4  '$ Eosinophils Absolute 0.0 - 0.5 K/uL 0.3    0.3 0.2 0.3  Basophils Relative % 1    0 1 1  Basophils Absolute 0.0 - 0.1 K/uL 0.1    0.0 0.1 0.1  Immature Granulocytes % 2    0 0 0  Abs Immature Granulocytes 0.00 - 0.07 K/uL 0.17 High     0.02 CM 0.03 CM 0.02 CM  Comment: Performed at [facility], [address]  [place], [place] [address]  Resulting Agency  [facility] [place] [facility] [place] [facility] [place] [facility] [place] [facility] [place] [facility]         Specimen Collected: 06/20/22 11:42 Last Resulted: 06/20/22 12:16

## 2022-07-18 ENCOUNTER — Telehealth (HOSPITAL_COMMUNITY): Payer: Self-pay

## 2022-07-18 NOTE — Telephone Encounter (Signed)
Called and spoke with pt in regards to CR, pt stated she is unable to participate at this time due to transportation.   Closed referral

## 2022-07-19 ENCOUNTER — Ambulatory Visit: Payer: PPO | Admitting: Oncology

## 2022-07-19 ENCOUNTER — Other Ambulatory Visit: Payer: PPO

## 2022-07-25 ENCOUNTER — Inpatient Hospital Stay: Payer: PPO | Admitting: Oncology

## 2022-07-25 ENCOUNTER — Inpatient Hospital Stay: Payer: PPO

## 2022-08-13 ENCOUNTER — Encounter: Payer: Self-pay | Admitting: Family Medicine

## 2022-08-13 ENCOUNTER — Ambulatory Visit (INDEPENDENT_AMBULATORY_CARE_PROVIDER_SITE_OTHER): Payer: PPO | Admitting: Family Medicine

## 2022-08-13 ENCOUNTER — Telehealth: Payer: Self-pay

## 2022-08-13 VITALS — BP 130/66 | HR 69 | Temp 97.5°F | Ht 59.0 in | Wt 158.1 lb

## 2022-08-13 DIAGNOSIS — I358 Other nonrheumatic aortic valve disorders: Secondary | ICD-10-CM

## 2022-08-13 DIAGNOSIS — R0989 Other specified symptoms and signs involving the circulatory and respiratory systems: Secondary | ICD-10-CM

## 2022-08-13 DIAGNOSIS — D473 Essential (hemorrhagic) thrombocythemia: Secondary | ICD-10-CM

## 2022-08-13 DIAGNOSIS — J411 Mucopurulent chronic bronchitis: Secondary | ICD-10-CM | POA: Diagnosis not present

## 2022-08-13 DIAGNOSIS — R7303 Prediabetes: Secondary | ICD-10-CM

## 2022-08-13 DIAGNOSIS — Z0000 Encounter for general adult medical examination without abnormal findings: Secondary | ICD-10-CM | POA: Diagnosis not present

## 2022-08-13 DIAGNOSIS — Z8673 Personal history of transient ischemic attack (TIA), and cerebral infarction without residual deficits: Secondary | ICD-10-CM | POA: Diagnosis not present

## 2022-08-13 DIAGNOSIS — I10 Essential (primary) hypertension: Secondary | ICD-10-CM | POA: Diagnosis not present

## 2022-08-13 DIAGNOSIS — N3946 Mixed incontinence: Secondary | ICD-10-CM

## 2022-08-13 DIAGNOSIS — J302 Other seasonal allergic rhinitis: Secondary | ICD-10-CM | POA: Diagnosis not present

## 2022-08-13 DIAGNOSIS — Z853 Personal history of malignant neoplasm of breast: Secondary | ICD-10-CM

## 2022-08-13 DIAGNOSIS — D75839 Thrombocytosis, unspecified: Secondary | ICD-10-CM | POA: Diagnosis not present

## 2022-08-13 DIAGNOSIS — Z0001 Encounter for general adult medical examination with abnormal findings: Secondary | ICD-10-CM | POA: Insufficient documentation

## 2022-08-13 DIAGNOSIS — I2510 Atherosclerotic heart disease of native coronary artery without angina pectoris: Secondary | ICD-10-CM | POA: Diagnosis not present

## 2022-08-13 DIAGNOSIS — L989 Disorder of the skin and subcutaneous tissue, unspecified: Secondary | ICD-10-CM

## 2022-08-13 DIAGNOSIS — F339 Major depressive disorder, recurrent, unspecified: Secondary | ICD-10-CM | POA: Diagnosis not present

## 2022-08-13 DIAGNOSIS — H6192 Disorder of left external ear, unspecified: Secondary | ICD-10-CM

## 2022-08-13 LAB — COMPREHENSIVE METABOLIC PANEL
ALT: 32 U/L (ref 0–35)
AST: 28 U/L (ref 0–37)
Albumin: 4 g/dL (ref 3.5–5.2)
Alkaline Phosphatase: 109 U/L (ref 39–117)
BUN: 15 mg/dL (ref 6–23)
CO2: 29 mEq/L (ref 19–32)
Calcium: 10.6 mg/dL — ABNORMAL HIGH (ref 8.4–10.5)
Chloride: 107 mEq/L (ref 96–112)
Creatinine, Ser: 0.8 mg/dL (ref 0.40–1.20)
GFR: 68.09 mL/min (ref >60.00)
Glucose, Bld: 99 mg/dL (ref 70–99)
Potassium: 5.7 mEq/L — ABNORMAL HIGH (ref 3.5–5.1)
Sodium: 141 mEq/L (ref 135–145)
Total Bilirubin: 0.5 mg/dL (ref 0.2–1.2)
Total Protein: 6.6 g/dL (ref 6.0–8.3)

## 2022-08-13 LAB — LIPID PANEL
Cholesterol: 94 mg/dL (ref 0–200)
HDL: 38.9 mg/dL — ABNORMAL LOW (ref >39.00)
LDL Cholesterol: 33 mg/dL (ref 0–99)
NonHDL: 55.28
Total CHOL/HDL Ratio: 2
Triglycerides: 109 mg/dL (ref 0.0–149.0)
VLDL: 21.8 mg/dL (ref 0.0–40.0)

## 2022-08-13 LAB — CBC WITH DIFFERENTIAL/PLATELET
Basophils Absolute: 0 10*3/uL (ref 0.0–0.1)
Basophils Relative: 0.5 % (ref 0.0–3.0)
Eosinophils Absolute: 0.4 10*3/uL (ref 0.0–0.7)
Eosinophils Relative: 4.1 % (ref 0.0–5.0)
HCT: 40.1 % (ref 36.0–46.0)
Hemoglobin: 13.1 g/dL (ref 12.0–15.0)
Lymphocytes Relative: 23.4 % (ref 12.0–46.0)
Lymphs Abs: 2.1 10*3/uL (ref 0.7–4.0)
MCHC: 32.8 g/dL (ref 30.0–36.0)
MCV: 90.6 fl (ref 78.0–100.0)
Monocytes Absolute: 0.5 10*3/uL (ref 0.1–1.0)
Monocytes Relative: 5.7 % (ref 3.0–12.0)
Neutro Abs: 6.1 10*3/uL (ref 1.4–7.7)
Neutrophils Relative %: 66.3 % (ref 43.0–77.0)
Platelets: 1022 10*3/uL (ref 150.0–400.0)
RBC: 4.42 Mil/uL (ref 3.87–5.11)
RDW: 19.2 % — ABNORMAL HIGH (ref 11.5–15.5)
WBC: 9.1 10*3/uL (ref 4.0–10.5)

## 2022-08-13 LAB — HEMOGLOBIN A1C: Hgb A1c MFr Bld: 6.5 % (ref 4.6–6.5)

## 2022-08-13 LAB — VITAMIN D 25 HYDROXY (VIT D DEFICIENCY, FRACTURES): VITD: 28.38 ng/mL — ABNORMAL LOW (ref 30.00–100.00)

## 2022-08-13 MED ORDER — FLUTICASONE PROPIONATE 50 MCG/ACT NA SUSP: 2.0000 | Freq: Every day | NASAL | 6 refills | Status: DC

## 2022-08-13 MED ORDER — VITAMIN D3 25 MCG (1000 UT) PO CAPS: 1000.0000 [IU] | ORAL_CAPSULE | Freq: Every day | ORAL | Status: DC

## 2022-08-13 NOTE — Assessment & Plan Note (Signed)
Preventative protocols reviewed and updated unless pt declined. Discussed healthy diet and lifestyle.  

## 2022-08-13 NOTE — Telephone Encounter (Signed)
Dr. Danise Mina verbally advised.

## 2022-08-13 NOTE — Progress Notes (Signed)
[name], female    DOB: 25-Feb-1941, 82 y.o.   MRN: [id number]  This visit was conducted in person.  BP 130/66   Pulse 69   Temp (!) 97.5 F (36.4 C) (Temporal)   Ht 4\' 11"  (1.499 m)   Wt 158 lb 2 oz (71.7 kg)   SpO2 95%   BMI 31.94 kg/m    CC: CPE Subjective:   HPI: [name] is a 82 y.o. female presenting on 08/13/2022 for Annual Exam (MCR prt 2 [AWV- 06/26/22].)   Saw health advisor 06/2022 for medicare wellness visit. Note reviewed.   No results found.  Flowsheet Row Clinical Support from 06/26/2022 in [place] at [place]  PHQ-2 Total Score 0          08/13/2022   12:38 PM 06/26/2022    9:34 AM 06/11/2022    9:05 AM 06/04/2022    1:01 PM 06/25/2021    9:53 AM  Fall Risk   Falls in the past year? 0 0 0 0 0  Number falls in past yr:  0   0  Injury with Fall?  0   0  Risk for fall due to :  No Fall Risks   No Fall Risks  Follow up  Falls prevention discussed;Falls evaluation completed   Falls prevention discussed    Moved from [place] to [place] 2019.   Recent hospitalization for NSTEMI. Saw cardiology clinic 06/28/2022, planned f/u 3 months. Continuing medical management.  She was unable to participate in cardiac rehab due to lack of transportation.   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   Requests handicap placard due to unsteadiness on feet. She has a cane to use. Notes arthralgias from arthritis worse with colder weather. Notes some symptoms of periph neuropathy as well as leg cramping. Placard application filled out  Worsening allergic rhinitis - worsening rhinorrhea, PNdrainage and worsening cough as well as sinus congestion. Requests medication for allergies - she already takes claritin 10mg  daily.   Poorly healing sore to left ear, present for 1-2 yrs. This is side she lays on to sleep. 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 Saw dermatology in [place].   COPD  - continues breo and spiriva. No recent albuterol use.   Preventative: Colon cancer screening - states had normal Cologuard in [place], aged out Lung cancer screening - aged out  Breast cancer screening - declines repeat despite personal hx breast cancer - recommend at least monthly self breast exams at home.  Well woman exam - aged out - no vaginal bleeding, pelvic pain or skin changes DEXA scan - unsure she's had. Declines. Good calcium in diet - good cheese and yogurt and spinach. Continues vit D 1000 IU daily. She walks on treadmill daily.  Flu shot -declined COVID shot - declined Tdap 2017 Prevnar13 2020, pneumovax 2017 Xostavax 2017 Shingrix - discussed, declines  RSV - discussed - will check with pharmacy Advanced directive discussion - daughter is HCPOA. DNR in the past - will need to verify.  Seat belt use discussed Sunscreen use discussed. No changing moles on skin. Ex smoker - quit 2001! 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 Interim medical history since our last visit reviewed. Allergies and medications reviewed and updated. Outpatient Medications Prior to Visit  Medication Sig Dispense Refill   acetaminophen (TYLENOL) 500 MG tablet Take 1 tablet (500 mg total) by mouth 3 (three) times daily as needed for moderate pain.     albuterol (PROVENTIL) (2.5 MG/3ML) 0.083% nebulizer solution Take 3 mLs (2.5 mg total) by  nebulization every 6 (six) hours as needed for shortness of breath. 150 mL 1   albuterol (VENTOLIN HFA) 108 (90 Base) MCG/ACT inhaler Inhale 2 puffs into the lungs every 4 (four) hours as needed for wheezing or shortness of breath. 6.7 g 3   aspirin 81 MG chewable tablet Chew 1 tablet (81 mg total) by mouth daily.     carvedilol (COREG) 6.25 MG tablet Take 1 tablet (6.25 mg total) by mouth 2 (two) times daily with a meal. 60 tablet 11   clopidogrel (PLAVIX) 75 MG tablet Take 1 tablet (75 mg total) by mouth daily with breakfast. 90 tablet 1   ezetimibe (ZETIA) 10 MG tablet Take 1 tablet (10 mg total) by mouth daily. 90 tablet 3   fluticasone furoate-vilanterol (BREO ELLIPTA) 200-25 MCG/ACT AEPB Inhale 1 puff into the lungs daily. 90 each 4   hydroxyurea (HYDREA) 500 MG capsule TAKE 1 CAPSULE (500 MG TOTAL) BY MOUTH DAILY. MAY TAKE WITH FOOD TO MINIMIZE GI SIDE EFFECTS. 90 capsule 1   loratadine (CLARITIN) 10 MG tablet Take 10 mg by mouth daily.     Multiple Vitamin (MULTIVITAMIN) tablet Take 1 tablet by mouth daily.     Multiple Vitamins-Minerals (PRESERVISION AREDS 2) CAPS Take 2 each by mouth daily at 12 noon.     nitroGLYCERIN (NITROSTAT) 0.4 MG SL tablet Place 1 tablet (0.4 mg total) under the tongue every 5 (five) minutes as needed for chest pain. 25 tablet 3   rosuvastatin (CRESTOR) 20 MG tablet Take 1 tablet (20 mg total) by mouth at bedtime. 90 tablet 3   sacubitril-valsartan (ENTRESTO) 24-26 MG Take 1 tablet by mouth 2 (two) times daily. 60 tablet 11   Tiotropium Bromide Monohydrate (SPIRIVA RESPIMAT) 2.5 MCG/ACT AERS Inhale 2 puffs into the lungs daily. INHALE 2 PUFFS INTO THE LUNGS EVERY DAY 12 g 4   No facility-administered medications prior to visit.     Per HPI unless specifically indicated in ROS section below Review of Systems  Constitutional:  Negative for activity change, appetite change, chills, fatigue, fever and unexpected weight change.  HENT:  Positive for congestion,  postnasal drip and rhinorrhea. Negative for hearing loss.   Eyes:  Negative for visual disturbance.  Respiratory:  Positive for cough (allergy related). Negative for chest tightness, shortness of breath and wheezing.   Cardiovascular:  Negative for chest pain, palpitations and leg swelling.  Gastrointestinal:  Negative for abdominal distention, abdominal pain, blood in stool, constipation, diarrhea, nausea and vomiting.  Genitourinary:  Negative for difficulty urinating and hematuria.  Musculoskeletal:  Negative for arthralgias, myalgias and neck pain.  Skin:  Negative for rash.  Neurological:  Negative for dizziness, seizures, syncope and headaches.  Hematological:  Negative for adenopathy. Does not bruise/bleed easily.  Psychiatric/Behavioral:  Negative for dysphoric mood. The patient is  not nervous/anxious.     Objective:  BP 130/66   Pulse 69   Temp (!) 97.5 F (36.4 C) (Temporal)   Ht 4\' 11"  (1.499 m)   Wt 158 lb 2 oz (71.7 kg)   SpO2 95%   BMI 31.94 kg/m   Wt Readings from Last 3 Encounters:  08/13/22 158 lb 2 oz (71.7 kg)  06/28/22 162 lb 9.6 oz (73.8 kg)  06/26/22 165 lb (74.8 kg)      Physical Exam Vitals and nursing note reviewed.  Constitutional:      Appearance: Normal appearance. She is not ill-appearing.  HENT:     Head: Normocephalic and atraumatic.     Right Ear: Tympanic membrane, ear canal and external ear normal. There is impacted cerumen.     Left Ear: Tympanic membrane and ear canal normal. There is impacted cerumen.     Ears:      Comments: Indurated tender skin thickening to left superior pinna    Mouth/Throat:     Mouth: Mucous membranes are moist.     Pharynx: Oropharynx is clear. No oropharyngeal exudate or posterior oropharyngeal erythema.  Eyes:     General:        Right eye: No discharge.        Left eye: No discharge.     Extraocular Movements: Extraocular movements intact.     Conjunctiva/sclera: Conjunctivae normal.     Pupils: Pupils  are equal, round, and reactive to light.  Neck:     Thyroid: No thyroid mass or thyromegaly.     Vascular: Carotid bruit (bilateral) present.  Cardiovascular:     Rate and Rhythm: Normal rate and regular rhythm.     Pulses: Normal pulses.     Heart sounds: Murmur (3/6 systolic USB) heard.  Pulmonary:     Effort: Pulmonary effort is normal. No respiratory distress.     Breath sounds: Wheezing (exp) and rhonchi (diffuse) present. No rales.  Abdominal:     General: Bowel sounds are normal. There is no distension.     Palpations: Abdomen is soft. There is no mass.     Tenderness: There is no abdominal tenderness. There is no guarding or rebound.     Hernia: No hernia is present.  Musculoskeletal:     Cervical back: Normal range of motion and neck supple. No rigidity.     Right lower leg: No edema.     Left lower leg: No edema.  Lymphadenopathy:     Cervical: No cervical adenopathy.  Skin:    General: Skin is warm and dry.     Findings: Lesion present. No rash.     Comments: Poorly healing indurated growth to right medial leg   Neurological:     General: No focal deficit present.     Mental Status: She is alert. Mental status is at baseline.  Psychiatric:        Mood and Affect: Mood normal.        Behavior: Behavior normal.       Results for orders placed or performed in visit on 08/13/22  Lipid panel  Result Value Ref Range   Cholesterol 94 0 - 200 mg/dL   Triglycerides 109.0 0.0 - 149.0 mg/dL   HDL 38.90 (L) >39.00 mg/dL   VLDL 21.8 0.0 - 40.0 mg/dL   LDL Cholesterol 33 0 - 99 mg/dL   Total CHOL/HDL Ratio 2    NonHDL 55.28   Comprehensive metabolic panel  Result Value Ref Range  Sodium 141 135 - 145 mEq/L   Potassium 5.7 (H) 3.5 - 5.1 mEq/L   Chloride 107 96 - 112 mEq/L   CO2 29 19 - 32 mEq/L   Glucose, Bld 99 70 - 99 mg/dL   BUN 15 6 - 23 mg/dL   Creatinine, Ser 0.80 0.40 - 1.20 mg/dL   Total Bilirubin 0.5 0.2 - 1.2 mg/dL   Alkaline Phosphatase 109 39 - 117 U/L    AST 28 0 - 37 U/L   ALT 32 0 - 35 U/L   Total Protein 6.6 6.0 - 8.3 g/dL   Albumin 4.0 3.5 - 5.2 g/dL   GFR 68.09 >60.00 mL/min   Calcium 10.6 (H) 8.4 - 10.5 mg/dL  Hemoglobin A1c  Result Value Ref Range   Hgb A1c MFr Bld 6.5 4.6 - 6.5 %  CBC with Differential/Platelet  Result Value Ref Range   WBC 9.1 4.0 - 10.5 K/uL   RBC 4.42 3.87 - 5.11 Mil/uL   Hemoglobin 13.1 12.0 - 15.0 g/dL   HCT 40.1 36.0 - 46.0 %   MCV 90.6 78.0 - 100.0 fl   MCHC 32.8 30.0 - 36.0 g/dL   RDW 19.2 (H) 11.5 - 15.5 %   Platelets 1022.0 Repeated and verified X2. (HH) 150.0 - 400.0 K/uL   Neutrophils Relative % 66.3 43.0 - 77.0 %   Lymphocytes Relative 23.4 12.0 - 46.0 %   Monocytes Relative 5.7 3.0 - 12.0 %   Eosinophils Relative 4.1 0.0 - 5.0 %   Basophils Relative 0.5 0.0 - 3.0 %   Neutro Abs 6.1 1.4 - 7.7 K/uL   Lymphs Abs 2.1 0.7 - 4.0 K/uL   Monocytes Absolute 0.5 0.1 - 1.0 K/uL   Eosinophils Absolute 0.4 0.0 - 0.7 K/uL   Basophils Absolute 0.0 0.0 - 0.1 K/uL  VITAMIN D 25 Hydroxy (Vit-D Deficiency, Fractures)  Result Value Ref Range   VITD 28.38 (L) 30.00 - 100.00 ng/mL  Parathyroid hormone, intact (no Ca)  Result Value Ref Range   PTH 136 (H) 16 - 77 pg/mL      06/26/2022    9:24 AM 06/11/2022    9:05 AM 06/04/2022    1:01 PM 01/31/2022   12:26 PM 06/25/2021    9:55 AM  Depression screen PHQ 2/9  Decreased Interest 0 0 0 0 0  Down, Depressed, Hopeless 0 0 0 0 0  PHQ - 2 Score 0 0 0 0 0  Altered sleeping 0 0 0 1   Tired, decreased energy 0 3 1 1    Change in appetite 0 0 0 0   Feeling bad or failure about yourself  0 1 0 0   Trouble concentrating 0 1 1 0   Moving slowly or fidgety/restless 0 0 0 0   Suicidal thoughts 0 0 0 0   PHQ-9 Score 0 5 2 2    Difficult doing work/chores Not difficult at all Somewhat difficult Not difficult at all Not difficult at all        06/11/2022    9:06 AM 06/04/2022    1:02 PM 05/18/2020   12:03 PM  GAD 7 : Generalized Anxiety Score  Nervous, Anxious, on  Edge 1 0 0  Control/stop worrying 1 0 0  Worry too much - different things 1 0 0  Trouble relaxing 0 0 0  Restless 0 0 0  Easily annoyed or irritable 0 0 0  Afraid - awful might happen 0 0 0  Total GAD 7  Score 3 0 0  Anxiety Difficulty Not difficult at all Not difficult at all Not difficult at all   Assessment & Plan:   Problem List Items Addressed This Visit     Encounter for general adult medical examination with abnormal findings - Primary (Chronic)    Preventative protocols reviewed and updated unless pt declined. Discussed healthy diet and lifestyle.       Hypertension    Chronic, stable. Continue current regimen.       Depression, recurrent ([place])    Stable period off medication. H/o SAD.       Hx of breast cancer    Declines mammogram. Discussed this. She will continue home breast exams.       CAD (coronary artery disease)    Recent hospitalization for NSTEMI, planned continue medical management. Did not attend cardiac rehab due to transportation issues.       Relevant Orders   Lipid panel (Completed)   Comprehensive metabolic panel (Completed)   Primary hyperparathyroidism ([place])    Evidence of this on latest labwork - will need to discuss at f/u visit.       COPD (chronic obstructive pulmonary disease) ([place])    Presumed COPD in ex smoker -but also with evidence of reversibility with bronchodilator on latest PFTs suggestive of asthma - breo ellipta, spiriva, and albuterol PRN.       Relevant Medications   fluticasone (FLONASE) 50 MCG/ACT nasal spray   History of CVA (cerebrovascular accident) without residual deficits    H/o this -continues aspirin, statin, plavix and zetia.      Prediabetes    A1c creeping into diet controlled diabetes range - encourage renewed efforts to follow low sugar low carb diabetic diet.       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       Mixed stress and urge urinary incontinence    Describes occasional stress incontinence managed with pad - continue.       Allergic rhinitis    Notes worsening allergic rhinitis symptoms - predominant PNDrainage. She already takes antihistamine - will add flonase, monitoring for epistaxis.       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  In h/o prior skin cancer, will refer to dermatology.       Relevant Orders   Ambulatory referral to Dermatology   Skin lesion of right leg    Poorly healing wound to R lower leg - refer to derm r/o skin cancer.       Relevant Orders   Ambulatory referral to Dermatology   Aortic valve sclerosis    Anticipate cause of systolic murmur heard.       Carotid bruit    ?referred from aortic sclerosis vs carotid stenosis - consider baseline carotid US.         Meds ordered this encounter  Medications   DISCONTD: Cholecalciferol (VITAMIN D3) 25 MCG (1000 UT) capsule    Sig: Take 1 capsule (1,000 Units total) by mouth daily.    Dispense:  30 capsule   fluticasone (FLONASE) 50 MCG/ACT nasal spray    Sig: Place 2 sprays into both nostrils daily.    Dispense:  16 g    Refill:  6    Orders Placed This Encounter  Procedures   Lipid panel   Comprehensive metabolic panel  Hemoglobin A1c   CBC with Differential/Platelet   Parathyroid hormone, intact (no Ca)    Standing Status:   Future    Number of Occurrences:   1    Standing Expiration Date:   08/13/2023   VITAMIN D 25 Hydroxy (Vit-D Deficiency, Fractures)   Ambulatory referral to Dermatology    Referral Priority:   Routine    Referral Type:   Consultation    Referral Reason:   Specialty Services Required    Requested Specialty:   Dermatology    Number of Visits Requested:   1    Patient Instructions  Start flonase nasal steroid over the counter spray for allergies. Watch worsening nosebleed. Also use  nasal saline irrigation as needed throughout the day.  Handicap placard application provided today.  Consider checking with pharmacy about getting RSV vaccine.  We will refer you to skin doctor to check left ear and right leg Good to see you today Return in 6 months for follow up visit   Follow up plan: Return in about 6 months (around 02/13/2023) for follow up visit.  Ria Bush, MD

## 2022-08-13 NOTE — Assessment & Plan Note (Signed)
Declines mammogram. Discussed this. She will continue home breast exams.

## 2022-08-13 NOTE — Telephone Encounter (Signed)
[name] with [facility] lab called report critical plt 1,022. Repeated results and verified. Sending note to Dr Cristopher Peru pool and will let Lattie Haw CMA know. Also logged in lab notebook.

## 2022-08-13 NOTE — Telephone Encounter (Signed)
Noted. Known thrombocytosis on hydroxyurea.

## 2022-08-13 NOTE — Patient Instructions (Addendum)
Start flonase nasal steroid over the counter spray for allergies. Watch worsening nosebleed. Also use nasal saline irrigation as needed throughout the day.  Handicap placard application provided today.  Consider checking with pharmacy about getting RSV vaccine.  We will refer you to skin doctor to check left ear and right leg Good to see you today Return in 6 months for follow up visit

## 2022-08-14 LAB — PARATHYROID HORMONE, INTACT (NO CA): PTH: 136 pg/mL — ABNORMAL HIGH (ref 16–77)

## 2022-08-15 ENCOUNTER — Telehealth: Payer: Self-pay

## 2022-08-15 ENCOUNTER — Inpatient Hospital Stay: Payer: PPO | Admitting: Oncology

## 2022-08-15 ENCOUNTER — Other Ambulatory Visit: Payer: Self-pay | Admitting: Family Medicine

## 2022-08-15 ENCOUNTER — Inpatient Hospital Stay: Payer: PPO

## 2022-08-15 DIAGNOSIS — E875 Hyperkalemia: Secondary | ICD-10-CM

## 2022-08-15 DIAGNOSIS — E210 Primary hyperparathyroidism: Secondary | ICD-10-CM | POA: Insufficient documentation

## 2022-08-15 MED ORDER — PATIROMER SORBITEX CALCIUM 8.4 G PO PACK: 8.4000 g | PACK | Freq: Every day | ORAL | 0 refills | Status: DC

## 2022-08-15 MED ORDER — VITAMIN D3 25 MCG (1000 UT) PO CAPS: 2000.0000 [IU] | ORAL_CAPSULE | Freq: Every day | ORAL | Status: AC

## 2022-08-15 NOTE — Telephone Encounter (Addendum)
Lvm asking pt to call back. Need to relay lab results, Dr. Synthia Innocent message and schedule 1 mo rpt lab visit. Also, results mailed to patient.   Labs/Dr. Darnell Level msg: Your platelets remain high.   Your cholesterol levels, kidneys and liver returned normal. Vitamin D was low - increase replacement to 2000 units daily.  Your potassium returned very high. This may be due to one of your medicines. I recommend you increase water intake, avoid NSAIDs like ibuprofen or aleve, eat a low potassium diet (limit dried fruits, bran cereal, lima beans and nuts), and I recommend you take the medicine patiromer powder for 2 days to lower potassium level - sent to pharmacy.  Your calcium and parathyroid levels returned high. This may be due to primary hyperparathyroidism. I recommend we repeat labs once potassium and vit D are back to normal. Call our office to schedule lab visit in 1 month to recheck this.

## 2022-08-16 ENCOUNTER — Telehealth: Payer: Self-pay | Admitting: Family Medicine

## 2022-08-16 DIAGNOSIS — I10 Essential (primary) hypertension: Secondary | ICD-10-CM

## 2022-08-16 DIAGNOSIS — I5041 Acute combined systolic (congestive) and diastolic (congestive) heart failure: Secondary | ICD-10-CM

## 2022-08-16 DIAGNOSIS — J411 Mucopurulent chronic bronchitis: Secondary | ICD-10-CM

## 2022-08-16 NOTE — Telephone Encounter (Signed)
Placed pharmacy referral  based on their chronic conditions/medications and risk for hospitalization/poor outcomes.

## 2022-08-17 DIAGNOSIS — I358 Other nonrheumatic aortic valve disorders: Secondary | ICD-10-CM | POA: Insufficient documentation

## 2022-08-17 DIAGNOSIS — J309 Allergic rhinitis, unspecified: Secondary | ICD-10-CM | POA: Insufficient documentation

## 2022-08-17 DIAGNOSIS — L989 Disorder of the skin and subcutaneous tissue, unspecified: Secondary | ICD-10-CM | POA: Insufficient documentation

## 2022-08-17 DIAGNOSIS — R0989 Other specified symptoms and signs involving the circulatory and respiratory systems: Secondary | ICD-10-CM | POA: Insufficient documentation

## 2022-08-17 DIAGNOSIS — H6192 Disorder of left external ear, unspecified: Secondary | ICD-10-CM | POA: Insufficient documentation

## 2022-08-17 NOTE — Assessment & Plan Note (Addendum)
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  In h/o prior skin cancer, will refer to dermatology.

## 2022-08-17 NOTE — Assessment & Plan Note (Signed)
A1c creeping into diet controlled diabetes range - encourage renewed efforts to follow low sugar low carb diabetic diet.

## 2022-08-17 NOTE — Assessment & Plan Note (Signed)
Presumed COPD in ex smoker -but also with evidence of reversibility with bronchodilator on latest PFTs suggestive of asthma - breo ellipta, spiriva, and albuterol PRN.

## 2022-08-17 NOTE — Assessment & Plan Note (Addendum)
Has established with Dr Grayland Ormond heme/onc, now back on hydroxyurea 500mg  daily with short term planned f/u.

## 2022-08-17 NOTE — Assessment & Plan Note (Signed)
Recent hospitalization for NSTEMI, planned continue medical management. Did not attend cardiac rehab due to transportation issues.

## 2022-08-17 NOTE — Assessment & Plan Note (Signed)
Describes occasional stress incontinence managed with pad - continue.

## 2022-08-17 NOTE — Assessment & Plan Note (Signed)
H/o this -continues aspirin, statin, plavix and zetia.

## 2022-08-17 NOTE — Assessment & Plan Note (Addendum)
Stable period off medication. H/o SAD.

## 2022-08-17 NOTE — Assessment & Plan Note (Signed)
Poorly healing wound to R lower leg - refer to derm r/o skin cancer.

## 2022-08-17 NOTE — Assessment & Plan Note (Signed)
Notes worsening allergic rhinitis symptoms - predominant PNDrainage. She already takes antihistamine - will add flonase, monitoring for epistaxis.

## 2022-08-17 NOTE — Assessment & Plan Note (Signed)
Anticipate cause of systolic murmur heard.

## 2022-08-17 NOTE — Assessment & Plan Note (Signed)
Chronic, stable. Continue current regimen. 

## 2022-08-17 NOTE — Assessment & Plan Note (Signed)
Evidence of this on latest labwork - will need to discuss at f/u visit.

## 2022-08-17 NOTE — Assessment & Plan Note (Signed)
?  referred from aortic sclerosis vs carotid stenosis - consider baseline carotid US.

## 2022-08-19 NOTE — Telephone Encounter (Signed)
Called patient and reviewed all information. Patient verbalized understanding. Will call if any further questions.  Scheduled 1 month lab visit

## 2022-08-20 ENCOUNTER — Telehealth: Payer: Self-pay

## 2022-08-20 NOTE — Progress Notes (Signed)
Care Management & Coordination Services Pharmacy Team  Reason for Encounter: Initial Appointment Reminder  Contacted patient to confirm in office appointment with Charlene Brooke, PharmD on 08/21/2022 at 10:00.  Unsuccessful outreach. Left voicemail for patient to return call.  Chart review:  Recent office visits:  08/15/22 Orders: Start: Patiromer 8.4 g Start: Vit D3 25 mcg due to report critical plt 1,022  08/13/22 Ria Bush, MD Annual Exam Abnormal Labs "platelets remain high. She has f/u with Dr Grayland Ormond today. Cholesterol levels, kidneys and liver returned normal. Vitamin D was low - increase replacement to 2000 units daily. Potassium returned very high - this may be due to one of her medicines. Rec increase water intake, avoid NSAIDs like ibuprofen or aleve, low potassium diet (limit dried fruits, bran cereal, lima beans and nuts), and recommend she take medicine patiromer powder for 2 days to lower potassium level - sent to pharmacy. Calcium and parathyroid levels returned high - this may be due to primary hyperparathyroidism. Recommend repeat this once potassium and vit D is back to normal - schedule lab visit in 1 month to recheck this." Start: FLONASE 50 MCG/ACT Start: Cholecalciferol 2,000 units F/U 6 months 06/26/22 AWV 06/11/22 Ria Bush, MD NSTEMI Referral to: Hematology/Oncology Change: Albuterol Sulfate 2.5 mg add 108 mcg Change: Fluticasone Furoate-Vilanterol 200-25 mcg Change: SPIRIVA RESPIMAT 2.5 MCG/ACT AERS Stop (patient): Zinc 50 mg F/U 2 months 06/04/22 Ria Bush, MD Substernal Chest Pain Change: Aspirin 650 mg Stop (Patient): Flaxseed Administered: Nitroglycerin 0.4 mg Patient sent to ED by EMS per Dr. Danise Mina  Recent consult visits:  06/28/22 Nicholes Rough, PA (Cardiology) NSTEMI Complete: Echo F/U 3 months 06/20/22 Delight Hoh, MD (Oncology) Hemochromatosis Start: Hydroxyurea 500 mg   Hospital visits:  Medication Reconciliation was  completed by comparing discharge summary, patient's EMR and Pharmacy list, and upon discussion with patient.  Admitted to the hospital on 06/04/2022 due to NSTEMI. Discharge date was 06/06/2022. Discharged from [place]?Medications Started at [facility] Discharge:?? aspirin 81 MG chewable tablet  carvedilol 6.25 MG tablet  clopidogrel 75 MG tablet ezetimibe 10 MG tablet  nitroGLYCERIN 0.4 MG SL tablet  rosuvastatin 20 MG tablet sacubitril-valsartan 24-26 MG   Medication Changes at Hospital Discharge: None noted  Medications Discontinued at Hospital Discharge: aspirin 325 MG tablet ibuprofen 200 MG tablet  Medications that remain the same after Hospital Discharge:??  -All other medications will remain the same.    Star Rating Drugs:  Medication:    Last Fill: Day Supply Rosuvastatin 20 mg   06/06/2022 90 Sacubitril-Valsartan 24-26 mg 07/29/2022 30  Care Gaps: Annual wellness visit in last year? Yes 06/26/2022  If Diabetic: Last eye exam / retinopathy screening: Up to date Last diabetic foot exam: .,up  Charlene Brooke, PharmD notified  Marijean Niemann, [place] Assistant [phone]

## 2022-08-20 NOTE — Progress Notes (Unsigned)
Care Management & Coordination Services Pharmacy Note  08/20/2022 Name:  [name] MRN:  [id number] DOB:  12/18/1940  Summary: Initial OV ***  Recommendations/Changes made from today's visit: ***  Follow up plan: -Health Concierge will call patient *** -Pharmacist follow up televisit scheduled for *** -ECHO 08/30/22; Cardiology appt 09/04/22; LAB 09/18/22; hematology appt 09/05/22; PCP appt 02/14/23    Subjective: Nancy Merritt is an 82 y.o. year old female who is a primary patient of Nancy Bush, MD.  The care coordination team was consulted for assistance with disease management and care coordination needs.    Engaged with patient face to face for initial visit.  Recent office visits: 08/13/22 Dr Danise Mina OV: annual - start Flonase. Consider RSV vaccine. Refer to dermatology. Increase Vitamin D to 2000 IU. K 5.7 - possible Entresto, avoid NSAIDs. Start Patiromer x 2 days. Ca, PTH high - repeat 1 month.  06/11/22 Dr Danise Mina OV: hospital f/u - BP high 166/84; did not take entresto or coreg yet. Arlyce Harman was stopped in hospital d/t low BP.   Recent consult visits: 06/28/22 PA Harriet Pho (Cardiology): f/u CAD - no changes; f/u ECHO April.   06/20/22 Dr Grayland Ormond (Hematology): New pt - hemochromatosis, thrombocytosis. Restart hydroxyurea 500 mg daily. RTC 1 month.  Hospital visits: 06/04/22 - 06/06/22 Admission [facility]): NSTEMI - Start coreg, Entresto, spiro - though spiro was stopped d/t hypotension. Added Plavix to aspirin. Added ezetimibe.   Objective:  Lab Results  Component Value Date   CREATININE 0.80 08/13/2022   BUN 15 08/13/2022   GFR 68.09 08/13/2022   GFRNONAA >60 06/06/2022   GFRAA >60 01/07/2020   NA 141 08/13/2022   K 5.7 (H) 08/13/2022   CALCIUM 10.6 (H) 08/13/2022   CO2 29 08/13/2022   GLUCOSE 99 08/13/2022    Lab Results  Component Value Date/Time   HGBA1C 6.5 08/13/2022 01:38 PM   HGBA1C 6.3 01/31/2022 12:33 PM   GFR 68.09 08/13/2022 01:38 PM   GFR  68.35 01/31/2022 12:33 PM    Last diabetic Eye exam: No results found for: "HMDIABEYEEXA"  Last diabetic Foot exam: No results found for: "HMDIABFOOTEX"   Lab Results  Component Value Date   CHOL 94 08/13/2022   HDL 38.90 (L) 08/13/2022   LDLCALC 33 08/13/2022   TRIG 109.0 08/13/2022   CHOLHDL 2 08/13/2022       Latest Ref Rng & Units 08/13/2022    1:38 PM 01/31/2022   12:33 PM 07/30/2021    3:24 PM  Hepatic Function  Total Protein 6.0 - 8.3 g/dL 6.6  6.5  6.2   Albumin 3.5 - 5.2 g/dL 4.0  3.8  4.1   AST 0 - 37 U/L 28  15  19    ALT 0 - 35 U/L 32  12  16   Alk Phosphatase 39 - 117 U/L 109  89  88   Total Bilirubin 0.2 - 1.2 mg/dL 0.5  0.5  0.4     Lab Results  Component Value Date/Time   TSH 2.21 01/31/2022 12:33 PM   TSH 2.31 03/17/2017 12:00 AM       Latest Ref Rng & Units 08/13/2022    1:38 PM 06/20/2022   11:42 AM 06/05/2022    5:46 AM  CBC  WBC 4.0 - 10.5 K/uL 9.1  10.6  8.0   Hemoglobin 12.0 - 15.0 g/dL 13.1  12.9  11.6   Hematocrit 36.0 - 46.0 % 40.1  39.0  35.4   Platelets 150.0 - 400.0 K/uL  1022.0 Repeated and verified X2.  1,425  1,173     Lab Results  Component Value Date/Time   VD25OH 28.38 (L) 08/13/2022 01:38 PM   VD25OH 34.84 01/31/2022 12:33 PM   VITAMINB12 1,075 (H) 01/31/2022 12:33 PM    Clinical ASCVD: Yes  The ASCVD Risk score (Arnett DK, et al., 2019) failed to calculate for the following reasons:   The 2019 ASCVD risk score is only valid for ages 5 to 13   The patient has a prior MI or stroke diagnosis        06/26/2022    9:24 AM 06/11/2022    9:05 AM 06/04/2022    1:01 PM  Depression screen PHQ 2/9  Decreased Interest 0 0 0  Down, Depressed, Hopeless 0 0 0  PHQ - 2 Score 0 0 0  Altered sleeping 0 0 0  Tired, decreased energy 0 3 1  Change in appetite 0 0 0  Feeling bad or failure about yourself  0 1 0  Trouble concentrating 0 1 1  Moving slowly or fidgety/restless 0 0 0  Suicidal thoughts 0 0 0  PHQ-9 Score 0 5 2  Difficult  doing work/chores Not difficult at all Somewhat difficult Not difficult at all     Social History   Tobacco Use  Smoking Status Former   Packs/day: 1.00   Years: 40.00   Additional pack years: 0.00   Total pack years: 40.00   Types: Cigarettes   Quit date: 2002   Years since quitting: 22.2  Smokeless Tobacco Never   BP Readings from Last 3 Encounters:  08/13/22 130/66  06/28/22 124/86  06/20/22 (!) 140/68   Pulse Readings from Last 3 Encounters:  08/13/22 69  06/28/22 73  06/20/22 75   Wt Readings from Last 3 Encounters:  08/13/22 158 lb 2 oz (71.7 kg)  06/28/22 162 lb 9.6 oz (73.8 kg)  06/26/22 165 lb (74.8 kg)   BMI Readings from Last 3 Encounters:  08/13/22 31.94 kg/m  06/28/22 30.72 kg/m  06/26/22 31.18 kg/m    Allergies  Allergen Reactions   Advair Diskus [Fluticasone-Salmeterol] Shortness Of Breath    As of 09/01/2014. Pt can take Breo   Levofloxacin Other (See Comments)    Nausea, brain fog, depression, as of 12/12/14   Qvar [Beclomethasone] Other (See Comments)    Lungs closing-as of 05/03/2015   Singulair [Montelukast] Shortness Of Breath   Symbicort [Budesonide-Formoterol Fumarate] Other (See Comments)    Hard time breathing as of 10/31/2014.    Medications Reviewed Today     Reviewed by Nancy Bush, MD (Physician) on 08/17/22 at 1032  Med List Status: <None>   Medication Order Taking? Sig Documenting Provider Last Dose Status Informant  acetaminophen (TYLENOL) 500 MG tablet [id number] Yes Take 1 tablet (500 mg total) by mouth 3 (three) times daily as needed for moderate pain. Nancy Bush, MD Taking Active   albuterol (PROVENTIL) (2.5 MG/3ML) 0.083% nebulizer solution [id number] Yes Take 3 mLs (2.5 mg total) by nebulization every 6 (six) hours as needed for shortness of breath. Nancy Bush, MD Taking Active   albuterol (VENTOLIN HFA) 108 [phone] Base) MCG/ACT inhaler [id number] Yes Inhale 2 puffs into the lungs every 4 (four) hours as  needed for wheezing or shortness of breath. Nancy Bush, MD Taking Active   aspirin 81 MG chewable tablet [id number] Yes Chew 1 tablet (81 mg total) by mouth daily. Almyra Deforest, [place] Taking Active   carvedilol (COREG) 6.25 MG tablet [id number] Yes  Take 1 tablet (6.25 mg total) by mouth 2 (two) times daily with a meal. Almyra Deforest, PA Taking Active   Cholecalciferol (VITAMIN D3) 25 MCG (1000 UT) capsule [id number]  Take 2 capsules (2,000 Units total) by mouth daily. Nancy Bush, MD  Active   clopidogrel (PLAVIX) 75 MG tablet [id number] Yes Take 1 tablet (75 mg total) by mouth daily with breakfast. Almyra Deforest, PA Taking Active   ezetimibe (ZETIA) 10 MG tablet [id number] Yes Take 1 tablet (10 mg total) by mouth daily. Almyra Deforest, [place] Taking Active   fluticasone [facility]) 50 MCG/ACT nasal spray [id number] Yes Place 2 sprays into both nostrils daily. Nancy Bush, MD  Active   fluticasone furoate-vilanterol (BREO ELLIPTA) 200-25 MCG/ACT AEPB [id number] Yes Inhale 1 puff into the lungs daily. Nancy Bush, MD Taking Active   hydroxyurea (HYDREA) 500 MG capsule [id number] Yes TAKE 1 CAPSULE (500 MG TOTAL) BY MOUTH DAILY. MAY TAKE WITH FOOD TO MINIMIZE GI SIDE EFFECTS. Lloyd Huger, MD Taking Active   loratadine (CLARITIN) 10 MG tablet [id number] Yes Take 10 mg by mouth daily. [provider] Taking Active Self  Multiple Vitamin (MULTIVITAMIN) tablet [id number] Yes Take 1 tablet by mouth daily. [provider] Taking Active Self  Multiple Vitamins-Minerals (PRESERVISION AREDS 2) CAPS [id number] Yes Take 2 each by mouth daily at 12 noon. [provider] Taking Active Self  nitroGLYCERIN (NITROSTAT) 0.4 MG SL tablet [id number] Yes Place 1 tablet (0.4 mg total) under the tongue every 5 (five) minutes as needed for chest pain. Almyra Deforest, [place] Taking Active   patiromer (VELTASSA) 8.4 g packet [id number]  Take 1 packet (8.4 g total) by mouth daily. Nancy Bush, MD  Active    rosuvastatin (CRESTOR) 20 MG tablet [id number] Yes Take 1 tablet (20 mg total) by mouth at bedtime. Almyra Deforest, [place] Taking Active   sacubitril-valsartan (ENTRESTO) 24-26 [place] [id number] Yes Take 1 tablet by mouth 2 (two) times daily. Almyra Deforest, [place] Taking Active   Tiotropium Bromide Monohydrate (SPIRIVA RESPIMAT) 2.5 MCG/ACT AERS [id number] Yes Inhale 2 puffs into the lungs daily. INHALE 2 PUFFS INTO THE LUNGS EVERY DAY Nancy Bush, MD Taking Active             SDOH:  (Social Determinants of Health) assessments and interventions performed: {yes/no:20286} SDOH Interventions    Flowsheet Row Clinical Support from 06/26/2022 in [place] at [facility] Telephone from 06/07/2022 in [place] from 11/18/2019 in [place] at [facility] Visit from 05/26/2018 in [place] at [facility] Visit from 04/07/2018 in [place] at [place] Interventions Intervention Not Indicated -- -- -- --  Housing Interventions Intervention Not Indicated Intervention Not Indicated -- -- --  Transportation Interventions Intervention Not Indicated Intervention Not Indicated -- -- --  Utilities Interventions Intervention Not Indicated -- -- -- --  Alcohol Usage Interventions Intervention Not Indicated (Score <7) -- -- -- --  Depression Interventions/Treatment  -- -- PHQ2-9 Score <4 Follow-up Not Indicated Medication Currently on Treatment, Medication  Financial Strain Interventions Intervention Not Indicated -- -- -- --  Physical Activity Interventions Cardiac Rehab  [Starting Rehab soon sees Dr.Pemberton] -- -- -- --  Stress Interventions Intervention Not Indicated -- -- -- --  Social Connections Interventions Intervention Not Indicated -- -- -- --       Medication Assistance: {MEDASSISTANCEINFO:25044}  Medication  Access: Within the past 30 days, how often has patient missed a dose of medication? *** Is a pillbox or other method used to improve adherence? {YES/NO:21197} Factors that may affect medication adherence? {CHL DESC; BARRIERS:21522} Are meds synced by current pharmacy? {YES/NO:21197} Are meds delivered by current pharmacy? {YES/NO:21197} Does patient experience delays in picking up medications due to transportation concerns? {YES/NO:21197}  [facility] Reviewed: Is patient disadvantaged to use [facility]?: {YES/NO:21197} Current Rx insurance plan: *** Name and location of Current pharmacy:  [id number] - [place], [place] - [address] [place] [address] Phone: [phone] Fax: [phone]  [facility] services reviewed with patient today?: {YES/NO:21197} Patient requests to transfer care to [facility]?: {YES/NO:21197} Reason patient declined to change pharmacies: {US patient preference:27474}  Compliance/Adherence/Medication fill history: Care Gaps: None  Star-Rating Drugs: Rosuvastatin - PDC 100%   Assessment/Plan  Heart Failure (Goal: manage symptoms and prevent exacerbations) -{US controlled/uncontrolled:25276} -Current home BP/HR readings: *** -Current home daily weights: *** -Last ejection fraction: 60-65% (Date: 05/2022) -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%) -NYHA Class: {CHL HP [facility] NYHA Class:[phone]} -AHA HF Stage: C (Heart disease and symptoms present) -Current treatment: Carvedilol 6.25 mg BID Entresto 24-26 mg BID -Medications previously tried: lisinopril, spironolactone  -Current dietary habits: *** -Current exercise habits: *** -Educated on {CCM HF Counseling:25125} -{CCMPHARMDINTERVENTION:25122}  Hyperlipidemia: (LDL goal < 70) -{US controlled/uncontrolled:25276} -CAD, hx CABG, recent NSTEMI -Current treatment: Rosuvastatin 20 mg daily HS Ezetimibe 10 mg  daily Nitroglycerin 0.4 mg SL prn Clopidogrel 75 mg daily Aspirin 81 mg daily -Medications previously tried: ***  -Current dietary patterns: *** -Current exercise habits: *** -Educated on {CCM HLD Counseling:25126} -{CCMPHARMDINTERVENTION:25122}  Asthma / COPD (Goal: control symptoms and prevent exacerbations) -{US controlled/uncontrolled:25276} -Gold Grade: Gold 2 (FEV1 50-79%) -Current COPD Classification:  {CHL [facility] COPD Class (Updated):28375} -MMRC/CAT score: *** -Pulmonary function testing: 06/2015- FEV1/FVC 0.54, FEV1 73% -Exacerbations requiring treatment in last 6 months: *** -Current treatment  Spiriva Respimat 2.5 mcg 2 puff daily Breo Ellipta 200-25 mcg 1 puff daily Albuterol HFA prn Albuterol neb PRN -Medications previously tried: Advair, Symbicort, montelukast, Qvar  -Patient {Actions; denies-reports:120008} consistent use of maintenance inhaler -Frequency of rescue inhaler use: *** -Counseled on {CCMINHALERCOUNSELING:25121} -{CCMPHARMDINTERVENTION:25122}  Diabetes (A1c goal <7%) -{US controlled/uncontrolled:25276} - A1c 6.5% (07/2022) -Current meal patterns:  breakfast: ***  lunch: ***  dinner: *** snacks: *** drinks: *** -Current exercise: *** -Educated on {CCM DM COUNSELING:25123} -Counseled to check feet daily and get yearly eye exams -{CCMPHARMDINTERVENTION:25122}  Health Maintenance -Vaccine gaps: *** -Hx breast cancer; declined mammogram -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

## 2022-08-21 ENCOUNTER — Telehealth: Payer: Self-pay | Admitting: Family Medicine

## 2022-08-21 ENCOUNTER — Encounter: Payer: PPO | Admitting: Pharmacist

## 2022-08-21 NOTE — Telephone Encounter (Signed)
Patient called in today and stated that she can't come in today. She would like to reschedule for another day to come into the office. Thank you!

## 2022-08-21 NOTE — Telephone Encounter (Cosign Needed)
Patient has been rescheduled for an in-office appointment with Charlene Brooke, PharmD on 09/03/2022 at 10:00 am. Patient requested appointment to be made after 09/02/2022 due to company staying with her from out of town.  Charlene Brooke, PharmD notified  Marijean Niemann, [place] Clinical Pharmacy Assistant [phone]

## 2022-08-21 NOTE — Telephone Encounter (Signed)
Cancelled appt. Please reschedule in initial time slot.

## 2022-08-26 NOTE — Progress Notes (Signed)
Cardiology Office Note:    Date:  09/04/2022   ID:  Nancy Merritt, DOB [birth year], MRN [id number]  PCP:  Eustaquio Boyden, MD   [facility]  Cardiologist:  Meriam Sprague, MD  Advanced Practice Provider:  No care team member to display Electrophysiologist:  None   Referring MD: Eustaquio Boyden, MD    History of Present Illness:    Nancy Merritt is a 82 y.o. 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  Patient suffered a stroke when she was visiting her [place] in [place] in 04/2020. Specifically, she developed speech difficulties, seemed "out-of-it", and was told she was drooling. MRI confirmed acute stroke. CTA head and neck without acute findings (per report). TTE was performed but unknown results. No known arrhythmias. Symptoms fortunately resolved. She was started on ASA 81mg  and atorvastatin 40mg  daily. Blood pressure has been well controlled at home on lisinopril.    Following her stroke, the patient developed COVID in 05/2020. Noted that her O2 sats were low at 84% with associated fatigue prompting her to go the ER. She has been on supplemental O2 since that time.   During our  visit on 07/17/20, we ordered a 30day monitor to assess for Afib given recent CVA, which was negative. We recommended her to see EP, but she declined at that time and wanted to discuss loop further. We also obtained a TTE 08/09/20 which revealed LVEF 60-65%, G1DD, normal RV and systolic function, mild aortic sclerosis, +PFO but unlikely to be source of CVA.  Was admitted 05/2022 with chest pain found to be hypertensive with elevated troponin. Cath showed severe native vessel CAD with patent grafts including LIMA to LAD, SVG to diagonal, SVG to OM and SVG to right PDA. LVEDP was eleavted at . Symptoms ultimately thought to be  demand in the setting of hypertension and volume overload and she was managed medically. TTE 08/2022 with EF 61%, normal GLS, normal RV systolic function, mild MR, severe aortic sclerosis without stenosis.  Today, the patient overall feels well. She admits to missing her home BP medications and her BP is running high today. When she does take her medications, her BP is much better controlled 130/70s. No chest pain, LE edema, orthopnea, PND, or weight gain. Continues to have shortness of breath which has worsened with allergy season. Only using oxygen as needed.    Past Medical History:  Diagnosis Date   Arthritis    Asthma    Cancer 1990   COVID-19 06/14/2020   Depression    Hx of breast cancer 04/07/2018   1990. No screening in last 25 years    Past Surgical History:  Procedure Laterality Date   BREAST SURGERY Left 1990   CORONARY ARTERY BYPASS GRAFT     JOINT REPLACEMENT Right    Hip   LEFT HEART CATH AND CORS/GRAFTS ANGIOGRAPHY N/A 06/05/2022   Procedure: LEFT HEART CATH AND CORS/GRAFTS ANGIOGRAPHY;  Surgeon: Iran Ouch, MD;  Location: [facility];  Service: Cardiovascular;  Laterality: N/A;   OVARIAN CYST REMOVAL  1970   TUBAL LIGATION  1970    Current Medications: Current Meds  Medication Sig   acetaminophen (TYLENOL) 500 MG tablet Take 1 tablet (500 mg total) by mouth 3 (three) times daily as needed for moderate pain.   albuterol (PROVENTIL) (  2.5 MG/3ML) 0.083% nebulizer solution Take 3 mLs (2.5 mg total) by nebulization every 6 (six) hours as needed for shortness of breath.   albuterol (VENTOLIN HFA) 108 (90 Base) MCG/ACT inhaler Inhale 2 puffs into the lungs every 4 (four) hours as needed for wheezing or shortness of breath.   aspirin 81 MG chewable tablet Chew 1 tablet (81 mg total) by mouth daily.   carvedilol (COREG) 6.25 MG tablet Take 1 tablet (6.25 mg total) by mouth 2 (two) times daily with a meal.   Cholecalciferol (VITAMIN D3) 25 MCG (1000 UT) capsule  Take 2 capsules (2,000 Units total) by mouth daily.   clopidogrel (PLAVIX) 75 MG tablet Take 1 tablet (75 mg total) by mouth daily with breakfast.   ezetimibe (ZETIA) 10 MG tablet Take 1 tablet (10 mg total) by mouth daily.   fluticasone (FLONASE) 50 MCG/ACT nasal spray Place 2 sprays into both nostrils daily.   fluticasone furoate-vilanterol (BREO ELLIPTA) 200-25 MCG/ACT AEPB Inhale 1 puff into the lungs daily.   hydroxyurea (HYDREA) 500 MG capsule TAKE 1 CAPSULE (500 MG TOTAL) BY MOUTH DAILY. MAY TAKE WITH FOOD TO MINIMIZE GI SIDE EFFECTS.   loratadine (CLARITIN) 10 MG tablet Take 10 mg by mouth daily.   Multiple Vitamin (MULTIVITAMIN) tablet Take 1 tablet by mouth daily.   Multiple Vitamins-Minerals (PRESERVISION AREDS 2) CAPS Take 2 each by mouth daily at 12 noon.   nitroGLYCERIN (NITROSTAT) 0.4 MG SL tablet Place 1 tablet (0.4 mg total) under the tongue every 5 (five) minutes as needed for chest pain.   patiromer (VELTASSA) 8.4 g packet Take 1 packet (8.4 g total) by mouth daily.   rosuvastatin (CRESTOR) 20 MG tablet Take 1 tablet (20 mg total) by mouth at bedtime.   sacubitril-valsartan (ENTRESTO) 24-26 MG Take 1 tablet by mouth 2 (two) times daily.   Tiotropium Bromide Monohydrate (SPIRIVA RESPIMAT) 2.5 MCG/ACT AERS Inhale 2 puffs into the lungs daily. INHALE 2 PUFFS INTO THE LUNGS EVERY DAY     Allergies:   Advair diskus [fluticasone-salmeterol], Levofloxacin, Qvar [beclomethasone], Singulair [montelukast], and Symbicort [budesonide-formoterol fumarate]   Social History   Socioeconomic History   Marital status: Widowed    Spouse name: Not on file   Number of children: 3   Years of education: 2 associate degrees   Highest education level: Not on file  Occupational History   Not on file  Tobacco Use   Smoking status: Former    Packs/day: 1.00    Years: 40.00    Additional pack years: 0.00    Total pack years: 40.00    Types: Cigarettes    Quit date: 2002    Years since  quitting: 22.3   Smokeless tobacco: Never  Vaping Use   Vaping Use: Never used  Substance and Sexual Activity   Alcohol use: Not Currently   Drug use: Never   Sexual activity: Not Currently  Other Topics Concern   Not on file  Social History Narrative   Moved from [place]   Lives with Daughter, Dorene Grebe  (one daughter in [place] and the other in [place]) - 5 granddaughters and 2 great-grandchildren   Enjoys needle work, reading, cook   Exercise: not really, can walk 1/2 a mile at slow pace w/o getting SOB, SOB with stairs   Sleeps on the ground floor   Retired from Audiological scientist (bookkeeper)   Social Determinants of Health   Financial Resource Strain: Low Risk  (06/26/2022)   Overall Financial Resource Strain (CARDIA)  Difficulty of Paying Living Expenses: Not hard at all  Food Insecurity: No Food Insecurity (06/26/2022)   Hunger Vital Sign    Worried About Running Out of Food in the Last Year: Never true    Ran Out of Food in the Last Year: Never true  Transportation Needs: No Transportation Needs (06/26/2022)   PRAPARE - Administrator, Civil Service (Medical): No    Lack of Transportation (Non-Medical): No  Physical Activity: Inactive (06/26/2022)   Exercise Vital Sign    Days of Exercise per Week: 4 days    Minutes of Exercise per Session: 0 min  Stress: No Stress Concern Present (06/26/2022)   [employer] of Occupational Health - Occupational Stress Questionnaire    Feeling of Stress : Not at all  Social Connections: Moderately Isolated (06/26/2022)   Social Connection and Isolation Panel [NHANES]    Frequency of Communication with Friends and Family: Twice a week    Frequency of Social Gatherings with Friends and Family: More than three times a week    Attends Religious Services: More than 4 times per year    Active Member of [employer] or Organizations: No    Attends Banker Meetings: Never    Marital Status: Widowed     Family History: The  patient's family history includes Alcohol abuse in her brother, daughter, father, and maternal grandfather; Arthritis in her brother, daughter, daughter, daughter, father, maternal grandfather, maternal grandmother, mother, sister, and sister; Asthma in her daughter; Breast cancer (age of onset: 42) in her sister; COPD in her daughter, daughter, father, mother, and sister; Cancer in her father, maternal grandfather, and sister; Depression in her daughter, daughter, daughter, and maternal grandfather; Diabetes in her father and mother; Drug abuse in her sister; Hearing loss in her daughter, maternal grandfather, and mother; Heart attack (age of onset: 63) in her father; Hypertension in her brother, daughter, daughter, daughter, father, maternal grandfather, maternal grandmother, mother, and sister; Miscarriages / Stillbirths in her daughter, mother, and sister; Stroke in her maternal grandmother and mother; Thyroid disease in her daughter, daughter, daughter, maternal grandmother, sister, and sister.  ROS:   Review of Systems  Constitutional: Negative for unexpected weight change.  HENT: Negative for hearing loss and rhinorrhea.   Eyes: Negative for visual disturbance.  Respiratory: Negative for cough.   Cardiovascular: Negative for chest pain, tightness, or pressure, and leg swelling.  Gastrointestinal: Negative for nausea, vomiting, diarrhea and blood in stool.  Genitourinary: Negative for dysuria and frequency.  Musculoskeletal: Negative for myalgias and arthralgias.  Skin: Negative for rash.  Neurological: Negative for headaches.  Hematological: Negative for adenopathy.  Psychiatric/Behavioral: Denies depression or anxiety    EKGs/Labs/Other Studies Reviewed:    The following studies were reviewed today: TTE 2022-09-15: IMPRESSIONS     1. Left ventricular ejection fraction, by estimation, is 60 to 65%. Left  ventricular ejection fraction by 3D volume is 61 %. The left ventricle has   normal function. The left ventricle has no regional wall motion  abnormalities. There is mild left  ventricular hypertrophy of the basal-septal segment. Left ventricular  diastolic parameters are consistent with Grade I diastolic dysfunction  (impaired relaxation). The average left ventricular global longitudinal  strain is -21.2 %. The global  longitudinal strain is normal.   2. Right ventricular systolic function is normal. The right ventricular  size is mildly enlarged. Tricuspid regurgitation signal is inadequate for  assessing PA pressure.   3. The mitral valve is normal in  structure. Mild mitral valve  regurgitation. No evidence of mitral stenosis.   4. The aortic valve is calcified. There is severe calcifcation of the  aortic valve. There is severe thickening of the aortic valve. Aortic valve  regurgitation is not visualized. Aortic valve sclerosis/calcification is  present, without any evidence of  aortic stenosis. Aortic valve area, by VTI measures 2.17 cm. Aortic valve  mean gradient measures 9.0 mmHg. Aortic valve Vmax measures 2.08 m/s.   5. Aortic dilatation noted. There is mild dilatation of the ascending  aorta, measuring 39 mm.   6. The inferior vena cava is normal in size with greater than 50%  respiratory variability, suggesting right atrial pressure of 3 mmHg.   Cath 06/05/2022   Prox Cx to Mid Cx lesion is 100% stenosed.   Mid LAD lesion is 100% stenosed.   Prox LAD to Mid LAD lesion is 70% stenosed.   1st Diag lesion is 100% stenosed.   Mid RCA lesion is 100% stenosed.   Origin to Prox Graft lesion is 30% stenosed.   RPAV lesion is 50% stenosed.   Prox RCA lesion is 80% stenosed.   LIMA graft was visualized by non-selective angiography and is normal in caliber.   SVG graft was visualized by angiography and is normal in caliber.   SVG graft was visualized by angiography.   The graft exhibits no disease.   The graft exhibits no disease.   The graft exhibits  mild .   The left ventricular systolic function is normal.   LV end diastolic pressure is moderately elevated.   The left ventricular ejection fraction is 45-50% by visual estimate.   1.  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 2.  Mildly reduced LV systolic function with moderately elevated left ventricular end-diastolic pressure at 28 mmHg.  Could not exclude wall motion abnormalities.   Recommendations: No clear culprit is identified for small myocardial infarction which could be due to small vessel disease versus stress-induced cardiomyopathy.  Obtain an echocardiogram.  Recommend blood pressure control and consider gentle diuresis.  I added clopidogrel to be used for 1 year and referred the patient to cardiac rehab. TTE 08/09/20:  1. Left ventricular ejection fraction, by estimation, is 60 to 65%. The  left ventricle has normal function. The left ventricle has no regional  wall motion abnormalities. Left ventricular diastolic parameters are  consistent with Grade I diastolic  dysfunction (impaired relaxation).   2. Right ventricular systolic function is normal. The right ventricular  size is normal. There is normal pulmonary artery systolic pressure. The  estimated right ventricular systolic pressure is 12.7 mmHg.   3. The mitral valve is grossly normal. Trivial mitral valve  regurgitation.   4. The aortic valve is tricuspid. Aortic valve regurgitation is not  visualized. Mild aortic valve sclerosis is present, with no evidence of  aortic valve stenosis.   5. The inferior vena cava is normal in size with greater than 50%  respiratory variability, suggesting right atrial pressure of 3 mmHg.   6. Evidence of possible atrial level shunting suggestive of a PFO - the  atrial septum is aneurysmal. Images do not visualize the left atrium well,  but significant bubbles are seen  within 3-4 beats in the left ventricle  suggesting a modest atrial level  shunt. TEE with bubble study recommended for confirmation.   30day monitor 08/23/20: Patient's monitoring  period was from 07/21/2020-08/19/2020 Predominant rhythm was NSR with average HR 85bpm; rnaging from 52-132bpm There was no Afib Rare brief runs of NSVT, rare PVCs, rare SVE No significant pauses or sustained arrhythmias    EKG:   No new ECG    Recent Labs: 01/31/2022: TSH 2.21 08/13/2022: ALT 32; BUN 15; Creatinine, Ser 0.80; Hemoglobin 13.1; Platelets 1022.0 Repeated and verified X2.; Potassium 5.7; Sodium 141  Recent Lipid Panel    Component Value Date/Time   CHOL 94 08/13/2022 1338   CHOL 158 07/17/2020 1213   TRIG 109.0 08/13/2022 1338   HDL 38.90 (L) 08/13/2022 1338   HDL 53 07/17/2020 1213   CHOLHDL 2 08/13/2022 1338   VLDL 21.8 08/13/2022 1338   LDLCALC 33 08/13/2022 1338   LDLCALC 87 07/17/2020 1213      Physical Exam:    VS:  Pulse 73   Ht 4\' 11"  (1.499 m)   Wt 161 lb 6.4 oz (73.2 kg)   SpO2 95%   BMI 32.60 kg/m     Wt Readings from Last 3 Encounters:  09/04/22 161 lb 6.4 oz (73.2 kg)  08/13/22 158 lb 2 oz (71.7 kg)  06/28/22 162 lb 9.6 oz (73.8 kg)     GEN: Well nourished, well developed in no acute distress HEENT: Normal NECK: No JVD; No carotid bruits CARDIAC: RRR, 2/6 systolic murmur RESPIRATORY:  Diffusely rhonchorous with scattered wheezing ABDOMEN: Soft, non-tender, non-distended MUSCULOSKELETAL:  No edema; No deformity  SKIN: Warm and dry NEUROLOGIC:  Alert and oriented x 3 PSYCHIATRIC:  Normal affect   ASSESSMENT:    1. Coronary artery disease involving native coronary artery of native heart without angina pectoris   2. Hypertensive emergency   3. Chronic obstructive pulmonary disease, unspecified COPD type   4. Cerebrovascular accident (CVA), unspecified mechanism   5. Chronic systolic heart failure     PLAN:    In order of problems listed  above:  #CAD s/p 4v CABG: - Most recent cath 05/2022 with severe native vessel disease with patent grafts including LIMA to LAD, SVG to diagonal, SVG to OM and SVG to right PDA  -TTE 08/2022 with LVEF 60-65%, no WMA -Continue ASA 81mg  daily, plavix 75mg  daily -Continue crestor 20mg  daily -Continue zetia 10mg  daily -Continue coreg 6.25mg  BID -Continue entresto 24-26mg  BID  #History of CVA -Patient with CVA in 04/2020 where she presented with speech changes, facial droop and mental status changes -MRI reportedly showed acute stroke. -Work-up in [facility] included CTA head and neck, MRI, TTE and telemetry monitoring  -30 day monitor here without Afib -TTE with normal BiV function and small PFO (incidental finding). No significant valve disease -Declined EP referral or loop recorder  -ASA 81mg  daily -Crestor 20mg  daily -Zetia 10mg  daily   #HTN: -Admitted in 05/2022 for hypertensive emergency  -BP much better controlled today -Continue coreg 6.25mg  BID -Continue entresto 24-26mg  BID   #HLD: -Continue crestor 20mg  daily -Continue zetia 10mg  daily -Goal LDL<70   #COPD: #Asthma: -Continue home inhalers -Continue supplemental O2 -Follow-up with PCP as scheduled  #Borderline mild AS: -Continue monitoring with every 2 years     Medication Adjustments/Labs and Tests Ordered: Current medicines are reviewed at length with the patient today.  Concerns regarding medicines are outlined above.  No orders of the defined types were placed in this encounter.  No orders of the defined types were placed in this encounter.   Patient Instructions  Medication Instructions:   Your physician recommends that you continue on  your current medications as directed. Please refer to the Current Medication list given to you today.  *If you need a refill on your cardiac medications before your next appointment, please call your pharmacy*    Follow-Up: At [facility], you and your  health needs are our priority.  As part of our continuing mission to provide you with exceptional heart care, we have created designated Provider Care Teams.  These Care Teams include your primary Cardiologist (physician) and Advanced Practice Providers (APPs -  Physician Assistants and Nurse Practitioners) who all work together to provide you with the care you need, when you need it.  We recommend signing up for the patient portal called "MyChart".  Sign up information is provided on this After Visit Summary.  MyChart is used to connect with patients for Virtual Visits (Telemedicine).  Patients are able to view lab/test results, encounter notes, upcoming appointments, etc.  Non-urgent messages can be sent to your provider as well.   To learn more about what you can do with MyChart, go to [email or web address].    Your next appointment:   6 month(s)  Provider:   Meriam Sprague, MD         Follow up in 6 months.    Signed, Meriam Sprague, MD  09/04/2022 3:45 PM    [facility]

## 2022-08-29 ENCOUNTER — Telehealth: Payer: Self-pay

## 2022-08-29 NOTE — Progress Notes (Signed)
Care Management & Coordination Services Pharmacy Team   Reason for Encounter: Initial Appointment Reminder   Contacted patient to confirm in office appointment with Al Corpus, PharmD on 09/03/2022 at 10:00.  Spoke with patient on 08/29/2022   Do you have any problems getting your medications? No  What is your top health concern you would like to discuss at your upcoming visit? Patient stated she did not have any. Also stated she has an Echo scheduled for tomorrow.   Have you seen any other providers since your last visit with PCP? No   Chart review:   Recent office visits:  08/15/22 Orders: Start: Patiromer 8.4 g Start: Vit D3 25 mcg due to report critical plt 1,022  08/13/22 Eustaquio Boyden, MD Annual Exam Abnormal Labs "platelets remain high. She has f/u with Dr Orlie Dakin today. Cholesterol levels, kidneys and liver returned normal. Vitamin D was low - increase replacement to 2000 units daily. Potassium returned very high - this may be due to one of her medicines. Rec increase water intake, avoid NSAIDs like ibuprofen or aleve, low potassium diet (limit dried fruits, bran cereal, lima beans and nuts), and recommend she take medicine patiromer powder for 2 days to lower potassium level - sent to pharmacy. Calcium and parathyroid levels returned high - this may be due to primary hyperparathyroidism. Recommend repeat this once potassium and vit D is back to normal - schedule lab visit in 1 month to recheck this." Start: FLONASE 50 MCG/ACT Start: Cholecalciferol 2,000 units F/U 6 months 06/26/22 AWV 06/11/22 Eustaquio Boyden, MD NSTEMI Referral to: Hematology/Oncology Change: Albuterol Sulfate 2.5 mg add 108 mcg Change: Fluticasone Furoate-Vilanterol 200-25 mcg Change: SPIRIVA RESPIMAT 2.5 MCG/ACT AERS Stop (patient): Zinc 50 mg F/U 2 months 06/04/22 Eustaquio Boyden, MD Substernal Chest Pain Change: Aspirin 650 mg Stop (Patient): Flaxseed Administered: Nitroglycerin 0.4 mg Patient sent  to ED by EMS per Dr. Sharen Hones   Recent consult visits:  06/28/22 Jari Favre, PA (Cardiology) NSTEMI Complete: Echo F/U 3 months 06/20/22 Gerarda Fraction, MD (Oncology) Hemochromatosis Start: Hydroxyurea 500 mg    Hospital visits:  Medication Reconciliation was completed by comparing discharge summary, patient's EMR and Pharmacy list, and upon discussion with patient.   Admitted to the hospital on 06/04/2022 due to NSTEMI. Discharge date was 06/06/2022. Discharged from [facility].     New?Medications Started at [facility] Discharge:?? aspirin 81 MG chewable tablet  carvedilol 6.25 MG tablet  clopidogrel 75 MG tablet ezetimibe 10 MG tablet  nitroGLYCERIN 0.4 MG SL tablet  rosuvastatin 20 MG tablet sacubitril-valsartan 24-26 MG   Medication Changes at Hospital Discharge: None noted   Medications Discontinued at Hospital Discharge: aspirin 325 MG tablet ibuprofen 200 MG tablet   Medications that remain the same after Hospital Discharge:??  -All other medications will remain the same.     Star Rating Drugs:  Medication:                                        Last Fill:         [facility] Supply Rosuvastatin 20 mg                            06/06/2022      90 Sacubitril-Valsartan 24-26 mg            07/29/2022      30  Care Gaps: Annual wellness visit in last year? Yes 06/26/2022   If Diabetic: Last eye exam / retinopathy screening: Up to date Last diabetic foot exam: .,up   Al Corpus, PharmD notified   Claudina Lick, [place] Clinical Pharmacy Assistant [phone]

## 2022-08-30 ENCOUNTER — Ambulatory Visit (HOSPITAL_COMMUNITY): Payer: PPO | Attending: Physician Assistant

## 2022-08-30 DIAGNOSIS — I5041 Acute combined systolic (congestive) and diastolic (congestive) heart failure: Secondary | ICD-10-CM | POA: Diagnosis not present

## 2022-09-01 LAB — ECHOCARDIOGRAM COMPLETE
AR max vel: 1.92 cm2
AV Area VTI: 2.17 cm2
AV Area mean vel: 1.96 cm2
AV Mean grad: 9 mmHg
AV Peak grad: 17.3 mmHg
Ao pk vel: 2.08 m/s
Area-P 1/2: 2.88 cm2
S' Lateral: 1.8 cm

## 2022-09-02 NOTE — Progress Notes (Unsigned)
Care Management & Coordination Services Pharmacy Note  09/02/2022 Name:  Nancy Merritt MRN:  [id number] DOB:  January 07, 1941  Summary: Initial OV -COPD/asthma: pt reports overall doing well with Breo + Spiriva and PRN albuterol; she reports symptoms are typically worse during allergy season; she reports 2 maintenance inhalers are expensive -Of note patient has reported allergy to Advair, Qvar, Symbicort but has tolerated Breo. Trelegy contains the same components as Breo plus the LAMA glycopyrrolate, pt does not have any noted allergies to LAMA previously, so upgrading to Trelegy is unlikely to cause any allergic issues -HF / CAD: reviewed new medications added in January after NSTEMI; discussed importance of home monitoring BP, HR and weight daily  Recommendations/Changes made from today's visit: -Upgrade Breo + Spiriva to Trelegy 100-62.5-25 mcg for cost savings -Increase Vitamin D to 2 capsules (2000 IU) daily -Continue to monitor blood pressure (goal < 130/80), heart rate (goal 60-80) and weight daily (dry weight ~158#)  Follow up plan: -Health Concierge will call patient 3 months for COPD/asthma update -Pharmacist follow up televisit scheduled for 6 months -Cardiology appt 09/04/22; hematology appt 09/05/22; LAB 09/18/22; PCP appt 02/14/23     Subjective: Nancy Merritt is an 82 y.o. year old female who is a primary patient of Eustaquio Boyden, MD.  The care coordination team was consulted for assistance with disease management and care coordination needs.    Engaged with patient face to face for initial visit.  Recent office visits: 08/13/22 Dr Sharen Hones OV: annual - start Flonase. Consider RSV vaccine. Refer to dermatology. Increase Vitamin D to 2000 IU. K 5.7 - possible Entresto, avoid NSAIDs. Start Patiromer x 2 days. Ca, PTH high - repeat 1 month.  06/11/22 Dr Sharen Hones OV: hospital f/u - BP high 166/84; did not take entresto or coreg yet. Cleda Daub was stopped in hospital d/t low BP.    Recent consult visits: 06/28/22 PA Asa Lente (Cardiology): f/u CAD - no changes; f/u ECHO April.   06/20/22 Dr Orlie Dakin (Hematology): New pt - hemochromatosis, thrombocytosis. Restart hydroxyurea 500 mg daily. RTC 1 month.  Hospital visits: 06/04/22 - 06/06/22 Admission [facility]): NSTEMI - Start coreg, Entresto, spiro - though spiro was stopped d/t hypotension. Added Plavix to aspirin. Added ezetimibe.   Objective:  Lab Results  Component Value Date   CREATININE 0.80 08/13/2022   BUN 15 08/13/2022   GFR 68.09 08/13/2022   GFRNONAA >60 06/06/2022   GFRAA >60 01/07/2020   NA 141 08/13/2022   K 5.7 (H) 08/13/2022   CALCIUM 10.6 (H) 08/13/2022   CO2 29 08/13/2022   GLUCOSE 99 08/13/2022    Lab Results  Component Value Date/Time   HGBA1C 6.5 08/13/2022 01:38 PM   HGBA1C 6.3 01/31/2022 12:33 PM   GFR 68.09 08/13/2022 01:38 PM   GFR 68.35 01/31/2022 12:33 PM    Last diabetic Eye exam: No results found for: "HMDIABEYEEXA"  Last diabetic Foot exam: No results found for: "HMDIABFOOTEX"   Lab Results  Component Value Date   CHOL 94 08/13/2022   HDL 38.90 (L) 08/13/2022   LDLCALC 33 08/13/2022   TRIG 109.0 08/13/2022   CHOLHDL 2 08/13/2022       Latest Ref Rng & Units 08/13/2022    1:38 PM 01/31/2022   12:33 PM 07/30/2021    3:24 PM  Hepatic Function  Total Protein 6.0 - 8.3 g/dL 6.6  6.5  6.2   Albumin 3.5 - 5.2 g/dL 4.0  3.8  4.1   AST 0 - 37 U/L 28  15  19   ALT 0 - 35 U/L 32  12  16   Alk Phosphatase 39 - 117 U/L 109  89  88   Total Bilirubin 0.2 - 1.2 mg/dL 0.5  0.5  0.4     Lab Results  Component Value Date/Time   TSH 2.21 01/31/2022 12:33 PM   TSH 2.31 03/17/2017 12:00 AM       Latest Ref Rng & Units 08/13/2022    1:38 PM 06/20/2022   11:42 AM 06/05/2022    5:46 AM  CBC  WBC 4.0 - 10.5 K/uL 9.1  10.6  8.0   Hemoglobin 12.0 - 15.0 g/dL 16.1  09.6  04.5   Hematocrit 36.0 - 46.0 % 40.1  39.0  35.4   Platelets 150.0 - 400.0 K/uL 1022.0 Repeated and verified X2.  1,425   1,173     Lab Results  Component Value Date/Time   VD25OH 28.38 (L) 08/13/2022 01:38 PM   VD25OH 34.84 01/31/2022 12:33 PM   VITAMINB12 1,075 (H) 01/31/2022 12:33 PM    Clinical ASCVD: Yes  The ASCVD Risk score (Arnett DK, et al., 2019) failed to calculate for the following reasons:   The 2019 ASCVD risk score is only valid for ages 26 to 43   The patient has a prior MI or stroke diagnosis        06/26/2022    9:24 AM 06/11/2022    9:05 AM 06/04/2022    1:01 PM  Depression screen PHQ 2/9  Decreased Interest 0 0 0  Down, Depressed, Hopeless 0 0 0  PHQ - 2 Score 0 0 0  Altered sleeping 0 0 0  Tired, decreased energy 0 3 1  Change in appetite 0 0 0  Feeling bad or failure about yourself  0 1 0  Trouble concentrating 0 1 1  Moving slowly or fidgety/restless 0 0 0  Suicidal thoughts 0 0 0  PHQ-9 Score 0 5 2  Difficult doing work/chores Not difficult at all Somewhat difficult Not difficult at all     Social History   Tobacco Use  Smoking Status Former   Packs/day: 1.00   Years: 40.00   Additional pack years: 0.00   Total pack years: 40.00   Types: Cigarettes   Quit date: 2002   Years since quitting: 22.3  Smokeless Tobacco Never   BP Readings from Last 3 Encounters:  08/13/22 130/66  06/28/22 124/86  06/20/22 (!) 140/68   Pulse Readings from Last 3 Encounters:  08/13/22 69  06/28/22 73  06/20/22 75   Wt Readings from Last 3 Encounters:  08/13/22 158 lb 2 oz (71.7 kg)  06/28/22 162 lb 9.6 oz (73.8 kg)  06/26/22 165 lb (74.8 kg)   BMI Readings from Last 3 Encounters:  08/13/22 31.94 kg/m  06/28/22 30.72 kg/m  06/26/22 31.18 kg/m    Allergies  Allergen Reactions   Advair Diskus [Fluticasone-Salmeterol] Shortness Of Breath    As of 09/01/2014. Pt can take Breo   Levofloxacin Other (See Comments)    Nausea, brain fog, depression, as of 12/12/14   Qvar [Beclomethasone] Other (See Comments)    Lungs closing-as of 05/03/2015   Singulair [Montelukast]  Shortness Of Breath   Symbicort [Budesonide-Formoterol Fumarate] Other (See Comments)    Hard time breathing as of 10/31/2014.    Medications Reviewed Today     Reviewed by Eustaquio Boyden, MD (Physician) on 08/17/22 at 1032  Med List Status: <None>   Medication Order Taking? Sig Documenting Provider  Last Dose Status Informant  acetaminophen (TYLENOL) 500 MG tablet [id number] Yes Take 1 tablet (500 mg total) by mouth 3 (three) times daily as needed for moderate pain. Eustaquio Boyden, MD Taking Active   albuterol (PROVENTIL) (2.5 MG/3ML) 0.083% nebulizer solution [id number] Yes Take 3 mLs (2.5 mg total) by nebulization every 6 (six) hours as needed for shortness of breath. Eustaquio Boyden, MD Taking Active   albuterol (VENTOLIN HFA) 108 [phone] Base) MCG/ACT inhaler [id number] Yes Inhale 2 puffs into the lungs every 4 (four) hours as needed for wheezing or shortness of breath. Eustaquio Boyden, MD Taking Active   aspirin 81 MG chewable tablet [id number] Yes Chew 1 tablet (81 mg total) by mouth daily. Azalee Course, [place] Taking Active   carvedilol (COREG) 6.25 MG tablet [id number] Yes Take 1 tablet (6.25 mg total) by mouth 2 (two) times daily with a meal. Azalee Course, [place] Taking Active   Cholecalciferol (VITAMIN D3) 25 MCG (1000 UT) capsule [id number]  Take 2 capsules (2,000 Units total) by mouth daily. Eustaquio Boyden, MD  Active   clopidogrel (PLAVIX) 75 MG tablet [id number] Yes Take 1 tablet (75 mg total) by mouth daily with breakfast. Azalee Course, PA Taking Active   ezetimibe (ZETIA) 10 MG tablet [id number] Yes Take 1 tablet (10 mg total) by mouth daily. Azalee Course, [place] Taking Active   fluticasone [facility]) 50 MCG/ACT nasal spray [id number] Yes Place 2 sprays into both nostrils daily. Eustaquio Boyden, MD  Active   fluticasone furoate-vilanterol (BREO ELLIPTA) 200-25 MCG/ACT AEPB [id number] Yes Inhale 1 puff into the lungs daily. Eustaquio Boyden, MD Taking Active   hydroxyurea (HYDREA) 500 MG capsule [id number]  Yes TAKE 1 CAPSULE (500 MG TOTAL) BY MOUTH DAILY. MAY TAKE WITH FOOD TO MINIMIZE GI SIDE EFFECTS. Jeralyn Ruths, MD Taking Active   loratadine (CLARITIN) 10 MG tablet [id number] Yes Take 10 mg by mouth daily. [provider] Taking Active Self  Multiple Vitamin (MULTIVITAMIN) tablet [id number] Yes Take 1 tablet by mouth daily. [provider] Taking Active Self  Multiple Vitamins-Minerals (PRESERVISION AREDS 2) CAPS [id number] Yes Take 2 each by mouth daily at 12 noon. [provider] Taking Active Self  nitroGLYCERIN (NITROSTAT) 0.4 MG SL tablet [id number] Yes Place 1 tablet (0.4 mg total) under the tongue every 5 (five) minutes as needed for chest pain. Azalee Course, [place] Taking Active   patiromer (VELTASSA) 8.4 g packet [id number]  Take 1 packet (8.4 g total) by mouth daily. Eustaquio Boyden, MD  Active   rosuvastatin (CRESTOR) 20 MG tablet [id number] Yes Take 1 tablet (20 mg total) by mouth at bedtime. Azalee Course, [place] Taking Active   sacubitril-valsartan (ENTRESTO) 24-26 [place] [id number] Yes Take 1 tablet by mouth 2 (two) times daily. Azalee Course, [place] Taking Active   Tiotropium Bromide Monohydrate (SPIRIVA RESPIMAT) 2.5 MCG/ACT AERS [id number] Yes Inhale 2 puffs into the lungs daily. INHALE 2 PUFFS INTO THE LUNGS EVERY DAY Eustaquio Boyden, MD Taking Active             SDOH:  (Social Determinants of Health) assessments and interventions performed: No - done 06/2022 SDOH Interventions    Flowsheet Row Clinical Support from 06/26/2022 in [facility] [place] Telephone from 06/07/2022 in [facility] Care Coordination Clinical Support from 11/18/2019 in [facility] Visit from 05/26/2018 in [facility] Visit from 04/07/2018 in [facility] [place]  SDOH Interventions  Food Insecurity Interventions Intervention Not Indicated -- --  -- --  Housing Interventions Intervention Not Indicated Intervention Not Indicated -- -- --  Transportation Interventions Intervention Not Indicated Intervention Not Indicated -- -- --  Utilities Interventions Intervention Not Indicated -- -- -- --  Alcohol Usage Interventions Intervention Not Indicated (Score <7) -- -- -- --  Depression Interventions/Treatment  -- -- PHQ2-9 Score <4 Follow-up Not Indicated Medication Currently on Treatment, Medication  Financial Strain Interventions Intervention Not Indicated -- -- -- --  Physical Activity Interventions Cardiac Rehab  [Starting Rehab soon sees Dr.Pemberton] -- -- -- --  Stress Interventions Intervention Not Indicated -- -- -- --  Social Connections Interventions Intervention Not Indicated -- -- -- --       Medication Assistance: None required.  Patient affirms current coverage meets needs.  Medication Access: Within the past 30 days, how often has patient missed a dose of medication? 0 Is a pillbox or other method used to improve adherence? Yes  Factors that may affect medication adherence? no barriers identified Are meds synced by current pharmacy? No  Are meds delivered by current pharmacy? No  Does patient experience delays in picking up medications due to transportation concerns? No   [facility] Services Reviewed: Is patient disadvantaged to use [facility]?: Yes  Current Rx insurance plan: HTA Name and location of Current pharmacy:  [id number] [place], [place] - [address] AT [facility] [address] [place] [address] Phone: [phone] Fax: [phone]  [facility] services reviewed with patient today?: No  Patient requests to transfer care to [facility]?: No  Reason patient declined to change pharmacies: Disadvantaged due to insurance/mail order  Compliance/Adherence/Medication fill history: Care Gaps: None  Star-Rating Drugs: Rosuvastatin - PDC  100%   Assessment/Plan  Heart Failure (Goal: manage symptoms and prevent exacerbations) -Controlled -Current home BP/HR readings: 120s/60s -Current home daily weights: 158# -Last ejection fraction: 60-65% (Date: 08/2022); mild impaired relaxation -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%) -NYHA Class: I (no actitivty limitation) -AHA HF Stage: C (Heart disease and symptoms present) -Current treatment: Carvedilol 6.25 mg BID - Appropriate, Effective, Safe, Accessible Entresto 24-26 mg BID -Appropriate, Effective, Safe, Accessible -Medications previously tried: lisinopril, spironolactone  -Educated on Benefits of medications for managing symptoms and prolonging life; Importance of weighing daily; if you gain more than 3 pounds in one day or 5 pounds in one week, contact provider; Importance of blood pressure control -Recommended to continue current medication  Hyperlipidemia: (LDL goal < 70) -Controlled - LDL 33 (07/2022) at goal -CAD, hx CABG, recent NSTEMI 05/2022 -Current treatment: Rosuvastatin 20 mg daily HS -Appropriate, Effective, Safe, Accessible Ezetimibe 10 mg daily -Appropriate, Effective, Safe, Accessible Nitroglycerin 0.4 mg SL prn -Appropriate, Effective, Safe, Accessible Clopidogrel 75 mg daily -Appropriate, Effective, Safe, Accessible Aspirin 81 mg daily -Appropriate, Effective, Safe, Accessible -Medications previously tried: n/a  -Educated on Cholesterol goals; Benefits of statin for ASCVD risk reduction; purpose of DAPT; bleeding risk associated with DAPT -Recommended to continue current medication  Asthma / COPD (Goal: control symptoms and prevent exacerbations) -Controlled - pt reports symptoms are stable; she does have more issues with wheezing during allergy season; she uses nebulizer PRN in the home, only uses albuterol HFA if she is out; she does report 2 inhalers are expensive -Gold Grade: Gold 2 (FEV1 50-79%) -Current COPD Classification:  A (low sx, 0-1 moderate  exacerbations, no hospitalizations) -Pulmonary function testing: 06/2015- FEV1/FVC 0.54, FEV1 73%; reversibility evident -Exacerbations requiring treatment  in last 6 months: 0 -Current treatment  Spiriva Respimat 2.5 mcg 2 puff daily - Appropriate, Effective, Safe, Accessible Breo Ellipta 200-25 mcg 1 puff daily -Appropriate, Effective, Safe, Accessible Albuterol HFA prn -Appropriate, Effective, Safe, Accessible Albuterol neb PRN -Appropriate, Effective, Safe, Accessible -Medications previously tried: Advair, Symbicort, montelukast, Qvar  -Patient reports consistent use of maintenance inhaler -Frequency of rescue inhaler use: more frequent in allergy season -Counseled on Benefits of consistent maintenance inhaler use; When to use rescue inhaler -Of note patient has reported allergy to Advair, Qvar, Symbicort but has tolerated Breo. 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 Advised to increase to 2000 IU daily.   Al Corpus, PharmD, P[place]y [place], CPP Clinical Pharmacist Practitioner [facility] at [facility] [phone]

## 2022-09-03 ENCOUNTER — Ambulatory Visit: Payer: PPO | Admitting: Pharmacist

## 2022-09-03 NOTE — Patient Instructions (Signed)
Visit Information  Phone number for Pharmacist: [phone]  Thank you for meeting with me to discuss your medications! I look forward to working with you to achieve your health care goals. Below is a summary of what we talked about during the visit:  We will upgrade your Breo and Spiriva inhalers into one inhaler - Trelegy.   Increase your Vitamin D to 2 capsules daily.  Refill your Nitroglycerin every 6 months to make sure it is in date.  Switch your Claritin to another allergy pill (Allegra or Zyrtec).  Continue to monitor blood pressure (goal < 130/80), heart rate (goal 60-80) and weight daily.    Al Corpus, PharmD, BCACP Clinical Pharmacist [facility] at [facility] [phone]

## 2022-09-04 ENCOUNTER — Encounter: Payer: Self-pay | Admitting: Cardiology

## 2022-09-04 ENCOUNTER — Ambulatory Visit: Payer: PPO | Attending: Cardiology | Admitting: Cardiology

## 2022-09-04 VITALS — HR 73 | Ht 59.0 in | Wt 161.4 lb

## 2022-09-04 DIAGNOSIS — I5022 Chronic systolic (congestive) heart failure: Secondary | ICD-10-CM | POA: Diagnosis not present

## 2022-09-04 DIAGNOSIS — I639 Cerebral infarction, unspecified: Secondary | ICD-10-CM | POA: Diagnosis not present

## 2022-09-04 DIAGNOSIS — I2510 Atherosclerotic heart disease of native coronary artery without angina pectoris: Secondary | ICD-10-CM

## 2022-09-04 DIAGNOSIS — I161 Hypertensive emergency: Secondary | ICD-10-CM

## 2022-09-04 DIAGNOSIS — J449 Chronic obstructive pulmonary disease, unspecified: Secondary | ICD-10-CM

## 2022-09-04 NOTE — Patient Instructions (Signed)
Medication Instructions:   Your physician recommends that you continue on your current medications as directed. Please refer to the Current Medication list given to you today.  *If you need a refill on your cardiac medications before your next appointment, please call your pharmacy*    Follow-Up: At [facility], you and your health needs are our priority.  As part of our continuing mission to provide you with exceptional heart care, we have created designated Provider Care Teams.  These Care Teams include your primary Cardiologist (physician) and Advanced Practice Providers (APPs -  Physician Assistants and Nurse Practitioners) who all work together to provide you with the care you need, when you need it.  We recommend signing up for the patient portal called "MyChart".  Sign up information is provided on this After Visit Summary.  MyChart is used to connect with patients for Virtual Visits (Telemedicine).  Patients are able to view lab/test results, encounter notes, upcoming appointments, etc.  Non-urgent messages can be sent to your provider as well.   To learn more about what you can do with MyChart, go to [email or web address].    Your next appointment:   6 month(s)  Provider:   Heather E Pemberton, MD       

## 2022-09-05 ENCOUNTER — Encounter: Payer: Self-pay | Admitting: Oncology

## 2022-09-05 ENCOUNTER — Inpatient Hospital Stay: Payer: PPO | Attending: Oncology

## 2022-09-05 ENCOUNTER — Telehealth: Payer: Self-pay | Admitting: *Deleted

## 2022-09-05 ENCOUNTER — Inpatient Hospital Stay: Payer: PPO | Admitting: Oncology

## 2022-09-05 ENCOUNTER — Encounter: Payer: Self-pay | Admitting: *Deleted

## 2022-09-05 DIAGNOSIS — D473 Essential (hemorrhagic) thrombocythemia: Secondary | ICD-10-CM | POA: Insufficient documentation

## 2022-09-05 DIAGNOSIS — Z87891 Personal history of nicotine dependence: Secondary | ICD-10-CM | POA: Insufficient documentation

## 2022-09-05 DIAGNOSIS — Z801 Family history of malignant neoplasm of trachea, bronchus and lung: Secondary | ICD-10-CM | POA: Insufficient documentation

## 2022-09-05 DIAGNOSIS — Z8616 Personal history of COVID-19: Secondary | ICD-10-CM | POA: Insufficient documentation

## 2022-09-05 DIAGNOSIS — Z853 Personal history of malignant neoplasm of breast: Secondary | ICD-10-CM | POA: Diagnosis not present

## 2022-09-05 DIAGNOSIS — D75839 Thrombocytosis, unspecified: Secondary | ICD-10-CM

## 2022-09-05 DIAGNOSIS — Z803 Family history of malignant neoplasm of breast: Secondary | ICD-10-CM | POA: Diagnosis not present

## 2022-09-05 LAB — CBC WITH DIFFERENTIAL/PLATELET
Abs Immature Granulocytes: 0.03 10*3/uL (ref 0.00–0.07)
Basophils Absolute: 0 10*3/uL (ref 0.0–0.1)
Basophils Relative: 1 %
Eosinophils Absolute: 0.3 10*3/uL (ref 0.0–0.5)
Eosinophils Relative: 3 %
HCT: 39.9 % (ref 36.0–46.0)
Hemoglobin: 12.9 g/dL (ref 12.0–15.0)
Immature Granulocytes: 0 %
Lymphocytes Relative: 26 %
Lymphs Abs: 2.1 10*3/uL (ref 0.7–4.0)
MCH: 30.4 pg (ref 26.0–34.0)
MCHC: 32.3 g/dL (ref 30.0–36.0)
MCV: 93.9 fL (ref 80.0–100.0)
Monocytes Absolute: 0.4 10*3/uL (ref 0.1–1.0)
Monocytes Relative: 5 %
Neutro Abs: 5.4 10*3/uL (ref 1.7–7.7)
Neutrophils Relative %: 65 %
Platelets: 1182 10*3/uL (ref 150–400)
RBC: 4.25 MIL/uL (ref 3.87–5.11)
RDW: 18.1 % — ABNORMAL HIGH (ref 11.5–15.5)
WBC: 8.3 10*3/uL (ref 4.0–10.5)
nRBC: 0 % (ref 0.0–0.2)

## 2022-09-05 MED ORDER — TRELEGY ELLIPTA 100-62.5-25 MCG/ACT IN AEPB: 1.0000 | INHALATION_SPRAY | Freq: Every day | RESPIRATORY_TRACT | 3 refills | Status: DC

## 2022-09-05 NOTE — Patient Instructions (Addendum)
Lab orders faxed to PCP Dr. Sharen Hones per patient request to have labs drawn at pcp office. Patient will need repeat cbc with differential in 1 month.

## 2022-09-05 NOTE — Telephone Encounter (Signed)
RN received notification from lab regarding critical results. RN placed call to lab. Critical platelet count of 1, 182 reported by [facility]. Read back process performed. MD made aware.

## 2022-09-05 NOTE — Progress Notes (Signed)
[facility]  [phone] [phone]  ID: Nancy Merritt OB: 04/14/1941  MR#: [id number]  AOZ#:[id number]  Patient Care Team: Nancy Boyden, MD as PCP - General (Family Medicine) Nancy Sprague, MD as PCP - Cardiology (Cardiology) Nancy Cutter, MD as Consulting Physician (Pulmonary Disease) Nancy Merritt, [facility] as Pharmacist (Pharmacist)  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  INTERVAL HISTORY: Patient returns to clinic today for repeat laboratory work and further evaluation.  She currently feels well and is asymptomatic.  She is tolerating Hydrea well without significant side effects.  She has no neurologic complaints.  She denies any recent fevers or illnesses.  She has a good appetite and denies weight loss.  She has no chest pain, shortness of breath, cough, or hemoptysis.  She denies any nausea, vomiting, constipation, or diarrhea.  She has no urinary complaints.  Patient offers no specific complaints today.  REVIEW OF SYSTEMS:   Review of Systems  Constitutional: Negative.  Negative for fever, malaise/fatigue and weight loss.  Respiratory: Negative.  Negative for cough, hemoptysis and shortness of breath.   Cardiovascular: Negative.  Negative for chest pain and leg swelling.  Gastrointestinal: Negative.  Negative for abdominal pain.  Genitourinary: Negative.  Negative for dysuria.  Musculoskeletal: Negative.  Negative for back pain and myalgias.  Skin: Negative.  Negative for rash.  Neurological: Negative.  Negative for dizziness, focal weakness, weakness and headaches.  Psychiatric/Behavioral: Negative.  The patient is not nervous/anxious.     As per HPI. Otherwise, a complete review of systems is negative.  PAST MEDICAL HISTORY: Past Medical History:  Diagnosis Date   Arthritis    Asthma    Cancer 1990   COVID-19 06/14/2020   Depression    Hx of breast cancer 04/07/2018   1990. No  screening in last 25 years    PAST SURGICAL HISTORY: Past Surgical History:  Procedure Laterality Date   BREAST SURGERY Left 1990   CORONARY ARTERY BYPASS GRAFT     JOINT REPLACEMENT Right    Hip   LEFT HEART CATH AND CORS/GRAFTS ANGIOGRAPHY N/A 06/05/2022   Procedure: LEFT HEART CATH AND CORS/GRAFTS ANGIOGRAPHY;  Surgeon: Nancy Ouch, MD;  Location: [facility] INVASIVE CV LAB;  Service: Cardiovascular;  Laterality: N/A;   OVARIAN CYST REMOVAL  1970   TUBAL LIGATION  1970    FAMILY HISTORY: Family History  Problem Relation Age of Onset   Cancer Father        lung   Heart attack Father 44   Alcohol abuse Father    Arthritis Father    COPD Father    Diabetes Father    Hypertension Father    Breast cancer Sister 43   Arthritis Sister    Cancer Sister    COPD Sister    Drug abuse Sister    Hypertension Sister    Miscarriages / [place] Sister    Thyroid disease Sister    Arthritis Mother    COPD Mother    Diabetes Mother    Hearing loss Mother    Hypertension Mother    Miscarriages / [place] Mother    Stroke Mother    Alcohol abuse Brother    Arthritis Brother    Hypertension Brother    Arthritis Daughter    Asthma Daughter    Depression Daughter    Hypertension Daughter    Miscarriages / [place] Daughter    Thyroid disease Daughter    Arthritis Maternal Grandmother    Hypertension  Maternal Grandmother    Stroke Maternal Grandmother    Thyroid disease Maternal Grandmother    Alcohol abuse Maternal Grandfather    Arthritis Maternal Grandfather    Cancer Maternal Grandfather    Depression Maternal Grandfather    Hearing loss Maternal Grandfather    Hypertension Maternal Grandfather    Arthritis Sister    Thyroid disease Sister    Thyroid disease Daughter    Hypertension Daughter    Depression Daughter    COPD Daughter    Alcohol abuse Daughter    Arthritis Daughter    Arthritis Daughter    COPD Daughter    Depression Daughter    Hearing loss  Daughter    Hypertension Daughter    Thyroid disease Daughter     ADVANCED DIRECTIVES (Y/N):  N  HEALTH MAINTENANCE: Social History   Tobacco Use   Smoking status: Former    Packs/day: 1.00    Years: 40.00    Additional pack years: 0.00    Total pack years: 40.00    Types: Cigarettes    Quit date: 2002    Years since quitting: 22.3   Smokeless tobacco: Never  Vaping Use   Vaping Use: Never used  Substance Use Topics   Alcohol use: Not Currently   Drug use: Never     Colonoscopy:  PAP:  Bone density:  Lipid panel:  Allergies  Allergen Reactions   Advair Diskus [Fluticasone-Salmeterol] Shortness Of Breath    As of 09/01/2014. Pt can take Breo   Levofloxacin Other (See Comments)    Nausea, brain fog, depression, as of 12/12/14   Qvar [Beclomethasone] Other (See Comments)    Lungs closing-as of 05/03/2015   Singulair [Montelukast] Shortness Of Breath   Symbicort [Budesonide-Formoterol Fumarate] Other (See Comments)    Hard time breathing as of 10/31/2014.    Current Outpatient Medications  Medication Sig Dispense Refill   acetaminophen (TYLENOL) 500 MG tablet Take 1 tablet (500 mg total) by mouth 3 (three) times daily as needed for moderate pain.     albuterol (PROVENTIL) (2.5 MG/3ML) 0.083% nebulizer solution Take 3 mLs (2.5 mg total) by nebulization every 6 (six) hours as needed for shortness of breath. 150 mL 1   albuterol (VENTOLIN HFA) 108 (90 Base) MCG/ACT inhaler Inhale 2 puffs into the lungs every 4 (four) hours as needed for wheezing or shortness of breath. 6.7 g 3   aspirin 81 MG chewable tablet Chew 1 tablet (81 mg total) by mouth daily.     carvedilol (COREG) 6.25 MG tablet Take 1 tablet (6.25 mg total) by mouth 2 (two) times daily with a meal. 60 tablet 11   Cholecalciferol (VITAMIN D3) 25 MCG (1000 UT) capsule Take 2 capsules (2,000 Units total) by mouth daily. 30 capsule    clopidogrel (PLAVIX) 75 MG tablet Take 1 tablet (75 mg total) by mouth daily with  breakfast. 90 tablet 1   ezetimibe (ZETIA) 10 MG tablet Take 1 tablet (10 mg total) by mouth daily. 90 tablet 3   fluticasone (FLONASE) 50 MCG/ACT nasal spray Place 2 sprays into both nostrils daily. 16 g 6   Fluticasone-Umeclidin-Vilant (TRELEGY ELLIPTA) 100-62.5-25 MCG/ACT AEPB Inhale 1 puff into the lungs daily. 3 each 3   hydroxyurea (HYDREA) 500 MG capsule TAKE 1 CAPSULE (500 MG TOTAL) BY MOUTH DAILY. MAY TAKE WITH FOOD TO MINIMIZE GI SIDE EFFECTS. 90 capsule 1   loratadine (CLARITIN) 10 MG tablet Take 10 mg by mouth daily.     Multiple Vitamin (  MULTIVITAMIN) tablet Take 1 tablet by mouth daily.     Multiple Vitamins-Minerals (PRESERVISION AREDS 2) CAPS Take 2 each by mouth daily at 12 noon.     nitroGLYCERIN (NITROSTAT) 0.4 MG SL tablet Place 1 tablet (0.4 mg total) under the tongue every 5 (five) minutes as needed for chest pain. 25 tablet 3   patiromer (VELTASSA) 8.4 g packet Take 1 packet (8.4 g total) by mouth daily. 4 each 0   rosuvastatin (CRESTOR) 20 MG tablet Take 1 tablet (20 mg total) by mouth at bedtime. 90 tablet 3   sacubitril-valsartan (ENTRESTO) 24-26 MG Take 1 tablet by mouth 2 (two) times daily. 60 tablet 11   No current facility-administered medications for this visit.    OBJECTIVE: Vitals:   09/05/22 1416  BP: (!) 148/77  Pulse: 66  Resp: 20  Temp: 99.1 F (37.3 C)  SpO2: 97%     Body mass index is 34.13 kg/m.    ECOG FS:0 - Asymptomatic  General: Well-developed, well-nourished, no acute distress. Eyes: Pink conjunctiva, anicteric sclera. HEENT: Normocephalic, moist mucous membranes. Lungs: No audible wheezing or coughing. Heart: Regular rate and rhythm. Abdomen: Soft, nontender, no obvious distention. Musculoskeletal: No edema, cyanosis, or clubbing. Neuro: Alert, answering all questions appropriately. Cranial nerves grossly intact. Skin: No rashes or petechiae noted. Psych: Normal affect.  LAB RESULTS:  Lab Results  Component Value Date   NA 141  08/13/2022   K 5.7 (H) 08/13/2022   CL 107 08/13/2022   CO2 29 08/13/2022   GLUCOSE 99 08/13/2022   BUN 15 08/13/2022   CREATININE 0.80 08/13/2022   CALCIUM 10.6 (H) 08/13/2022   PROT 6.6 08/13/2022   ALBUMIN 4.0 08/13/2022   AST 28 08/13/2022   ALT 32 08/13/2022   ALKPHOS 109 08/13/2022   BILITOT 0.5 08/13/2022   GFRNONAA >60 06/06/2022   GFRAA >60 01/07/2020    Lab Results  Component Value Date   WBC 8.3 09/05/2022   NEUTROABS 5.4 09/05/2022   HGB 12.9 09/05/2022   HCT 39.9 09/05/2022   MCV 93.9 09/05/2022   PLT 1,182 (HH) 09/05/2022     STUDIES: ECHOCARDIOGRAM COMPLETE  Result Date: 09/01/2022    ECHOCARDIOGRAM REPORT   Patient Name:   KADASIA KASSING Date of Exam: 08/30/2022 Medical Rec #:  [id number]        Height:       59.0 in Accession #:    [id number]       Weight:       158.1 lb Date of Birth:  [birth year]        BSA:          1.669 m Patient Age:    81 years         BP:           130/66 mmHg Patient Gender: F                HR:           64 bpm. 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  CAD and Previous Myocardial Infarction, Prior                  CABG, COPD and Stroke; Risk Factors:Hypertension. Prediabetes.                  History of breast cancer. Asthma.  Sonographer:     Cathie Beams RCS Referring Phys:  [phone] TESSA N CONTE Diagnosing Phys: Armanda Magic MD IMPRESSIONS  1. Left ventricular ejection fraction, by estimation, is 60 to 65%. Left ventricular ejection fraction by 3D volume is 61 %. The left ventricle has normal function. The left ventricle has no regional wall motion abnormalities. There is mild left ventricular hypertrophy of the basal-septal segment. Left  ventricular diastolic parameters are consistent with Grade I diastolic dysfunction (impaired relaxation). The average left ventricular global longitudinal strain is -21.2 %. The global longitudinal strain is normal.  2. Right ventricular systolic function is normal. The right ventricular size is mildly enlarged. Tricuspid regurgitation signal is inadequate for assessing PA pressure.  3. The mitral valve is normal in structure. Mild mitral valve regurgitation. No evidence of mitral stenosis.  4. The aortic valve is calcified. There is severe calcifcation of the aortic valve. There is severe thickening of the aortic valve. Aortic valve regurgitation is not visualized. Aortic valve sclerosis/calcification is present, without any evidence of aortic stenosis. Aortic valve area, by VTI measures 2.17 cm. Aortic valve mean gradient measures 9.0 mmHg. Aortic valve Vmax measures 2.08 m/s.  5. Aortic dilatation noted. There is mild dilatation of the ascending aorta, measuring 39 mm.  6. The inferior vena cava is normal in size with greater than 50% respiratory variability, suggesting right atrial pressure of 3 mmHg. FINDINGS  Left Ventricle: Left ventricular ejection fraction, by estimation, is 60 to 65%. Left ventricular ejection fraction by 3D volume is 61 %. The left ventricle has normal function. The left ventricle has no regional wall motion abnormalities. The average left ventricular global longitudinal strain is -21.2 %. The global longitudinal strain is normal. The left ventricular internal cavity size was normal in size. There is mild left ventricular hypertrophy of the basal-septal segment. Left ventricular diastolic parameters are consistent with Grade I diastolic dysfunction (impaired relaxation). Normal left ventricular filling pressure. Right Ventricle: The right ventricular size is mildly enlarged. No increase in right ventricular wall thickness. Right ventricular systolic function is normal. Tricuspid  regurgitation signal is inadequate for assessing PA pressure. Left Atrium: Left atrial size was normal in size. Right Atrium: Right atrial size was normal in size. Pericardium: There is no evidence of pericardial effusion. Mitral Valve: The mitral valve is normal in structure. Mild mitral annular calcification. Mild mitral valve regurgitation. No evidence of mitral valve stenosis. Tricuspid Valve: The tricuspid valve is normal in structure. Tricuspid valve regurgitation is trivial. No evidence of tricuspid stenosis. Aortic Valve: The aortic valve is calcified. There is severe calcifcation of the aortic valve. There is severe thickening of the aortic valve. Aortic valve regurgitation is not visualized. Aortic valve sclerosis/calcification is present, without any evidence of aortic stenosis. Aortic valve mean gradient measures 9.0 mmHg. Aortic valve peak gradient measures 17.3 mmHg. Aortic valve area, by VTI measures 2.17 cm. Pulmonic Valve: The pulmonic valve was normal in structure. Pulmonic valve regurgitation is mild. No evidence of pulmonic stenosis. Aorta: Aortic dilatation noted. There is mild dilatation of the ascending aorta, measuring 39 mm. Venous: The inferior vena cava is normal in size with greater than 50% respiratory variability, suggesting right atrial  pressure of 3 mmHg. IAS/Shunts: No atrial level shunt detected by color flow Doppler.  LEFT VENTRICLE PLAX 2D LVIDd:         3.00 cm         Diastology LVIDs:         1.80 cm         LV e' medial:    7.40 cm/s LV PW:         1.10 cm         LV E/e' medial:  13.3 LV IVS:        1.20 cm         LV e' lateral:   7.72 cm/s LVOT diam:     2.00 cm         LV E/e' lateral: 12.8 LV SV:         102 LV SV Index:   61              2D LVOT Area:     3.14 cm        Longitudinal                                Strain                                2D Strain GLS  -21.3 %                                (A2C):                                2D Strain GLS  -19.4 %                                 (A3C):                                2D Strain GLS  -23.0 %                                (A4C):                                2D Strain GLS  -21.2 %                                Avg:                                 3D Volume EF                                LV 3D EF:    Left  ventricul                                             ar                                             ejection                                             fraction                                             by 3D                                             volume is                                             61 %.                                 3D Volume EF:                                3D EF:        61 %                                LV EDV:       151 ml                                LV ESV:       59 ml                                LV SV:        92 ml RIGHT VENTRICLE RV Basal diam:  4.00 cm RV S prime:     11.60 cm/s TAPSE (M-mode): 2.2 cm LEFT ATRIUM             Index        RIGHT ATRIUM           Index LA diam:        3.90 cm 2.34 cm/m   RA Area:     14.00 cm LA Vol (A2C):   47.2 ml 28.28 ml/m  RA Volume:   30.70 ml  18.39 ml/m LA Vol (A4C):   40.4 ml 24.20 ml/m LA Biplane Vol: 43.7 ml 26.18 ml/m  AORTIC VALVE AV Area (Vmax):    1.92 cm  AV Area (Vmean):   1.96 cm AV Area (VTI):     2.17 cm AV Vmax:           208.00 cm/s AV Vmean:          140.000 cm/s AV VTI:            0.471 m AV Peak Grad:      17.3 mmHg AV Mean Grad:      9.0 mmHg LVOT Vmax:         127.00 cm/s LVOT Vmean:        87.400 cm/s LVOT VTI:          0.325 m LVOT/AV VTI ratio: 0.69  AORTA Ao Root diam: 3.10 cm Ao Asc diam:  3.90 cm MITRAL VALVE MV Area (PHT): 2.88 cm     SHUNTS MV Decel Time: 263 msec     Systemic VTI:  0.32 m MV E velocity: 98.50 cm/s   Systemic Diam: 2.00 cm MV A velocity: 117.00 cm/s MV E/A ratio:  0.Armanda MagicTurner MD Electronically signed by Armanda Magic MD  Signature Date/Time: 09/01/2022/7:28:48 PM    Final (Updated)     ASSESSMENT: Essential thrombocytosis with CALR mutation.  PLAN:    Essential thrombocytosis with CALR mutation: Patient's platelet count remains significantly elevated, but improved to 1182.  Will increase Hydrea to 1000 mg daily.  Patient will have a CBC drawn at her primary care physician's office in 1 month and then return to clinic in 2 months with repeat laboratory work and further evaluation.  Cardiac disease: Being medically managed.  Follow-up with her cardiologist as indicated.  I spent a total of 20 minutes reviewing chart data, face-to-face evaluation with the patient, counseling and coordination of care as detailed above.    Patient expressed understanding and was in agreement with this plan. She also understands that She can call clinic at any time with any questions, concerns, or complaints.    Jeralyn Ruths, MD   09/05/2022 2:53 PM

## 2022-09-06 ENCOUNTER — Telehealth: Payer: Self-pay

## 2022-09-06 DIAGNOSIS — D473 Essential (hemorrhagic) thrombocythemia: Secondary | ICD-10-CM

## 2022-09-06 NOTE — Telephone Encounter (Signed)
Noted. Will order after upcoming labs May 1st.

## 2022-09-06 NOTE — Telephone Encounter (Signed)
Received faxed lab order from from Lysle Dingwall, RN of [facility].   Placed order in Dr. Timoteo Expose box.

## 2022-09-18 ENCOUNTER — Other Ambulatory Visit (INDEPENDENT_AMBULATORY_CARE_PROVIDER_SITE_OTHER): Payer: PPO

## 2022-09-18 DIAGNOSIS — E875 Hyperkalemia: Secondary | ICD-10-CM

## 2022-09-18 DIAGNOSIS — E210 Primary hyperparathyroidism: Secondary | ICD-10-CM

## 2022-09-18 LAB — BASIC METABOLIC PANEL
BUN: 18 mg/dL (ref 6–23)
CO2: 26 mEq/L (ref 19–32)
Calcium: 10.6 mg/dL — ABNORMAL HIGH (ref 8.4–10.5)
Chloride: 106 mEq/L (ref 96–112)
Creatinine, Ser: 0.79 mg/dL (ref 0.40–1.20)
GFR: 69.08 mL/min (ref >60.00)
Glucose, Bld: 112 mg/dL — ABNORMAL HIGH (ref 70–99)
Potassium: 4.8 mEq/L (ref 3.5–5.1)
Sodium: 138 mEq/L (ref 135–145)

## 2022-09-18 LAB — MAGNESIUM: Magnesium: 2 mg/dL (ref 1.5–2.5)

## 2022-09-18 LAB — VITAMIN D 25 HYDROXY (VIT D DEFICIENCY, FRACTURES): VITD: 40.1 ng/mL (ref 30.00–100.00)

## 2022-09-19 ENCOUNTER — Encounter (INDEPENDENT_AMBULATORY_CARE_PROVIDER_SITE_OTHER): Payer: PPO | Admitting: Ophthalmology

## 2022-09-19 LAB — PARATHYROID HORMONE, INTACT (NO CA): PTH: 62 pg/mL (ref 16–77)

## 2022-09-20 NOTE — Telephone Encounter (Signed)
Spoke with pt scheduling lab visit on 10/07/22 at 2:00.

## 2022-09-20 NOTE — Telephone Encounter (Addendum)
I've ordered labs requested from oncology Dr Orlie Dakin. (CBC with diff).  Plz schedule lab visit around 10/05/2022.

## 2022-09-20 NOTE — Addendum Note (Signed)
Addended by: Eustaquio Boyden on: 09/20/2022 07:35 AM   Modules accepted: Orders

## 2022-09-27 ENCOUNTER — Encounter (INDEPENDENT_AMBULATORY_CARE_PROVIDER_SITE_OTHER): Payer: PPO | Admitting: Ophthalmology

## 2022-09-27 DIAGNOSIS — I10 Essential (primary) hypertension: Secondary | ICD-10-CM

## 2022-09-27 DIAGNOSIS — H353132 Nonexudative age-related macular degeneration, bilateral, intermediate dry stage: Secondary | ICD-10-CM | POA: Diagnosis not present

## 2022-09-27 DIAGNOSIS — H43813 Vitreous degeneration, bilateral: Secondary | ICD-10-CM

## 2022-09-27 DIAGNOSIS — H35033 Hypertensive retinopathy, bilateral: Secondary | ICD-10-CM | POA: Diagnosis not present

## 2022-10-07 ENCOUNTER — Other Ambulatory Visit (INDEPENDENT_AMBULATORY_CARE_PROVIDER_SITE_OTHER): Payer: PPO

## 2022-10-07 DIAGNOSIS — D473 Essential (hemorrhagic) thrombocythemia: Secondary | ICD-10-CM | POA: Diagnosis not present

## 2022-10-07 LAB — CBC WITH DIFFERENTIAL/PLATELET
Basophils Absolute: 0 10*3/uL (ref 0.0–0.1)
Basophils Relative: 0.6 % (ref 0.0–3.0)
Eosinophils Absolute: 0.2 10*3/uL (ref 0.0–0.7)
Eosinophils Relative: 3.4 % (ref 0.0–5.0)
HCT: 35.9 % — ABNORMAL LOW (ref 36.0–46.0)
Hemoglobin: 12.4 g/dL (ref 12.0–15.0)
Lymphocytes Relative: 28.5 % (ref 12.0–46.0)
Lymphs Abs: 1.9 10*3/uL (ref 0.7–4.0)
MCHC: 34.6 g/dL (ref 30.0–36.0)
MCV: 93.7 fl (ref 78.0–100.0)
Monocytes Absolute: 0.4 10*3/uL (ref 0.1–1.0)
Monocytes Relative: 5.4 % (ref 3.0–12.0)
Neutro Abs: 4.2 10*3/uL (ref 1.4–7.7)
Neutrophils Relative %: 62.1 % (ref 43.0–77.0)
Platelets: 901 10*3/uL — ABNORMAL HIGH (ref 150.0–400.0)
RBC: 3.83 Mil/uL — ABNORMAL LOW (ref 3.87–5.11)
RDW: 19.5 % — ABNORMAL HIGH (ref 11.5–15.5)
WBC: 6.7 10*3/uL (ref 4.0–10.5)

## 2022-11-05 ENCOUNTER — Inpatient Hospital Stay (HOSPITAL_BASED_OUTPATIENT_CLINIC_OR_DEPARTMENT_OTHER): Payer: PPO | Admitting: Oncology

## 2022-11-05 ENCOUNTER — Inpatient Hospital Stay: Payer: PPO | Attending: Oncology

## 2022-11-05 ENCOUNTER — Encounter: Payer: Self-pay | Admitting: Oncology

## 2022-11-05 VITALS — BP 123/73 | HR 68 | Temp 97.8°F | Resp 16 | Ht 59.0 in | Wt 157.0 lb

## 2022-11-05 DIAGNOSIS — D473 Essential (hemorrhagic) thrombocythemia: Secondary | ICD-10-CM | POA: Insufficient documentation

## 2022-11-05 DIAGNOSIS — Z87891 Personal history of nicotine dependence: Secondary | ICD-10-CM | POA: Diagnosis not present

## 2022-11-05 DIAGNOSIS — Z79899 Other long term (current) drug therapy: Secondary | ICD-10-CM | POA: Insufficient documentation

## 2022-11-05 LAB — CBC WITH DIFFERENTIAL/PLATELET
Abs Immature Granulocytes: 0.02 10*3/uL (ref 0.00–0.07)
Basophils Absolute: 0 10*3/uL (ref 0.0–0.1)
Basophils Relative: 1 %
Eosinophils Absolute: 0.3 10*3/uL (ref 0.0–0.5)
Eosinophils Relative: 3 %
HCT: 39.6 % (ref 36.0–46.0)
Hemoglobin: 13.1 g/dL (ref 12.0–15.0)
Immature Granulocytes: 0 %
Lymphocytes Relative: 23 %
Lymphs Abs: 1.9 10*3/uL (ref 0.7–4.0)
MCH: 32 pg (ref 26.0–34.0)
MCHC: 33.1 g/dL (ref 30.0–36.0)
MCV: 96.8 fL (ref 80.0–100.0)
Monocytes Absolute: 0.4 10*3/uL (ref 0.1–1.0)
Monocytes Relative: 5 %
Neutro Abs: 5.7 10*3/uL (ref 1.7–7.7)
Neutrophils Relative %: 68 %
Platelets: 1014 10*3/uL (ref 150–400)
RBC: 4.09 MIL/uL (ref 3.87–5.11)
RDW: 16.3 % — ABNORMAL HIGH (ref 11.5–15.5)
WBC: 8.3 10*3/uL (ref 4.0–10.5)
nRBC: 0 % (ref 0.0–0.2)

## 2022-11-05 NOTE — Progress Notes (Unsigned)
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  INTERVAL HISTORY: Patient returns to clinic today for repeat laboratory work and further evaluation.  She currently feels well and is asymptomatic.  She is tolerating Hydrea well without significant side effects.  She has no neurologic complaints.  She denies any recent fevers or illnesses.  She has a good appetite and denies weight loss.  She has no chest pain, shortness of breath, cough, or hemoptysis.  She denies any nausea, vomiting, constipation, or diarrhea.  She has no urinary complaints.  Patient offers no specific complaints today.  REVIEW OF SYSTEMS:   Review of Systems  Constitutional: Negative.  Negative for fever, malaise/fatigue and weight loss.  Respiratory: Negative.  Negative for cough, hemoptysis and shortness of breath.   Cardiovascular: Negative.  Negative for chest pain and leg swelling.  Gastrointestinal: Negative.  Negative for abdominal pain.  Genitourinary: Negative.  Negative for dysuria.  Musculoskeletal: Negative.  Negative for back pain and myalgias.  Skin: Negative.  Negative for rash.  Neurological: Negative.  Negative for dizziness, focal weakness, weakness and headaches.  Psychiatric/Behavioral: Negative.  The patient is not nervous/anxious.     As per HPI. Otherwise, a complete review of systems is negative.  PAST MEDICAL HISTORY: Past Medical History:  Diagnosis Date   Arthritis    Asthma    Cancer (HCC) 1990   COVID-19 06/14/2020   Depression    Hx of breast cancer 04/07/2018   1990. No  screening in last 25 years    PAST SURGICAL HISTORY: Past Surgical History:  Procedure Laterality Date   BREAST SURGERY Left 1990   CORONARY ARTERY BYPASS GRAFT     JOINT REPLACEMENT Right    Hip   LEFT HEART CATH AND CORS/GRAFTS ANGIOGRAPHY N/A 06/05/2022   Procedure: LEFT HEART CATH AND CORS/GRAFTS ANGIOGRAPHY;  Surgeon: Iran Ouch, MD;  Location: [facility];  Service: Cardiovascular;  Laterality: N/A;   OVARIAN CYST REMOVAL  1970   TUBAL LIGATION  1970    FAMILY HISTORY: Family History  Problem Relation Age of Onset   Cancer Father        lung   Heart attack Father 50   Alcohol abuse Father    Arthritis Father    COPD Father    Diabetes Father    Hypertension Father    Breast cancer Sister 44   Arthritis Sister    Cancer Sister    COPD Sister    Drug abuse Sister    Hypertension Sister    Miscarriages / [place] Sister    Thyroid disease Sister    Arthritis Mother    COPD Mother    Diabetes Mother    Hearing loss Mother    Hypertension Mother    Miscarriages / [place] Mother    Stroke Mother    Alcohol abuse Brother    Arthritis Brother    Hypertension Brother    Arthritis Daughter    Asthma Daughter    Depression Daughter    Hypertension Daughter    Miscarriages / [place] Daughter    Thyroid disease Daughter    Arthritis Maternal Grandmother  Hypertension Maternal Grandmother    Stroke Maternal Grandmother    Thyroid disease Maternal Grandmother    Alcohol abuse Maternal Grandfather    Arthritis Maternal Grandfather    Cancer Maternal Grandfather    Depression Maternal Grandfather    Hearing loss Maternal Grandfather    Hypertension Maternal Grandfather    Arthritis Sister    Thyroid disease Sister    Thyroid disease Daughter    Hypertension Daughter    Depression Daughter    COPD Daughter    Alcohol abuse Daughter    Arthritis Daughter    Arthritis Daughter    COPD Daughter    Depression Daughter    Hearing loss  Daughter    Hypertension Daughter    Thyroid disease Daughter     ADVANCED DIRECTIVES (Y/N):  N  HEALTH MAINTENANCE: Social History   Tobacco Use   Smoking status: Former    Packs/day: 1.00    Years: 40.00    Additional pack years: 0.00    Total pack years: 40.00    Types: Cigarettes    Quit date: 2002    Years since quitting: 22.4   Smokeless tobacco: Never  Vaping Use   Vaping Use: Never used  Substance Use Topics   Alcohol use: Not Currently   Drug use: Never     Colonoscopy:  PAP:  Bone density:  Lipid panel:  Allergies  Allergen Reactions   Advair Diskus [Fluticasone-Salmeterol] Shortness Of Breath    As of 09/01/2014. Pt can take Breo   Levofloxacin Other (See Comments)    Nausea, brain fog, depression, as of 12/12/14   Qvar [Beclomethasone] Other (See Comments)    Lungs closing-as of 05/03/2015   Singulair [Montelukast] Shortness Of Breath   Symbicort [Budesonide-Formoterol Fumarate] Other (See Comments)    Hard time breathing as of 10/31/2014.    Current Outpatient Medications  Medication Sig Dispense Refill   acetaminophen (TYLENOL) 500 MG tablet Take 1 tablet (500 mg total) by mouth 3 (three) times daily as needed for moderate pain.     albuterol (PROVENTIL) (2.5 MG/3ML) 0.083% nebulizer solution Take 3 mLs (2.5 mg total) by nebulization every 6 (six) hours as needed for shortness of breath. 150 mL 1   albuterol (VENTOLIN HFA) 108 (90 Base) MCG/ACT inhaler Inhale 2 puffs into the lungs every 4 (four) hours as needed for wheezing or shortness of breath. 6.7 g 3   aspirin 81 MG chewable tablet Chew 1 tablet (81 mg total) by mouth daily.     carvedilol (COREG) 6.25 MG tablet Take 1 tablet (6.25 mg total) by mouth 2 (two) times daily with a meal. 60 tablet 11   Cholecalciferol (VITAMIN D3) 25 MCG (1000 UT) capsule Take 2 capsules (2,000 Units total) by mouth daily. 30 capsule    clopidogrel (PLAVIX) 75 MG tablet Take 1 tablet (75 mg total) by mouth daily with  breakfast. 90 tablet 1   ezetimibe (ZETIA) 10 MG tablet Take 1 tablet (10 mg total) by mouth daily. 90 tablet 3   fluticasone (FLONASE) 50 MCG/ACT nasal spray Place 2 sprays into both nostrils daily. 16 g 6   Fluticasone-Umeclidin-Vilant (TRELEGY ELLIPTA) 100-62.5-25 MCG/ACT AEPB Inhale 1 puff into the lungs daily. 3 each 3   hydroxyurea (HYDREA) 500 MG capsule TAKE 1 CAPSULE (500 MG TOTAL) BY MOUTH DAILY. MAY TAKE WITH FOOD TO MINIMIZE GI SIDE EFFECTS. 90 capsule 1   loratadine (CLARITIN) 10 MG tablet Take 10 mg by mouth daily.     Multiple  Vitamin (MULTIVITAMIN) tablet Take 1 tablet by mouth daily.     Multiple Vitamins-Minerals (PRESERVISION AREDS 2) CAPS Take 2 each by mouth daily at 12 noon.     nitroGLYCERIN (NITROSTAT) 0.4 MG SL tablet Place 1 tablet (0.4 mg total) under the tongue every 5 (five) minutes as needed for chest pain. 25 tablet 3   patiromer (VELTASSA) 8.4 g packet Take 1 packet (8.4 g total) by mouth daily. 4 each 0   rosuvastatin (CRESTOR) 20 MG tablet Take 1 tablet (20 mg total) by mouth at bedtime. 90 tablet 3   sacubitril-valsartan (ENTRESTO) 24-26 MG Take 1 tablet by mouth 2 (two) times daily. 60 tablet 11   No current facility-administered medications for this visit.    OBJECTIVE: Vitals:   11/05/22 1402  BP: 123/73  Pulse: 68  Resp: 16  Temp: 97.8 F (36.6 C)  SpO2: 96%     Body mass index is 31.71 kg/m.    ECOG FS:0 - Asymptomatic  General: Well-developed, well-nourished, no acute distress. Eyes: Pink conjunctiva, anicteric sclera. HEENT: Normocephalic, moist mucous membranes. Lungs: No audible wheezing or coughing. Heart: Regular rate and rhythm. Abdomen: Soft, nontender, no obvious distention. Musculoskeletal: No edema, cyanosis, or clubbing. Neuro: Alert, answering all questions appropriately. Cranial nerves grossly intact. Skin: No rashes or petechiae noted. Psych: Normal affect.  LAB RESULTS:  Lab Results  Component Value Date   NA 138  09/18/2022   K 4.8 09/18/2022   CL 106 09/18/2022   CO2 26 09/18/2022   GLUCOSE 112 (H) 09/18/2022   BUN 18 09/18/2022   CREATININE 0.79 09/18/2022   CALCIUM 10.6 (H) 09/18/2022   PROT 6.6 08/13/2022   ALBUMIN 4.0 08/13/2022   AST 28 08/13/2022   ALT 32 08/13/2022   ALKPHOS 109 08/13/2022   BILITOT 0.5 08/13/2022   GFRNONAA >60 06/06/2022   GFRAA >60 01/07/2020    Lab Results  Component Value Date   WBC 8.3 11/05/2022   NEUTROABS 5.7 11/05/2022   HGB 13.1 11/05/2022   HCT 39.6 11/05/2022   MCV 96.8 11/05/2022   PLT 1,014 (HH) 11/05/2022     STUDIES: No results found.  ASSESSMENT: Essential thrombocytosis with CALR mutation.  PLAN:    Essential thrombocytosis with CALR mutation: Patient's platelet count remains significantly elevated, but improved to 1182.  Will increase Hydrea to 1000 mg daily.  Patient will have a CBC drawn at her primary care physician's office in 1 month and then return to clinic in 2 months with repeat laboratory work and further evaluation.  Cardiac disease: Being medically managed.  Follow-up with her cardiologist as indicated.  I spent a total of 20 minutes reviewing chart data, face-to-face evaluation with the patient, counseling and coordination of care as detailed above.    Patient expressed understanding and was in agreement with this plan. She also understands that She can call clinic at any time with any questions, concerns, or complaints.    Jeralyn Ruths, MD   11/05/2022 2:29 PM

## 2022-11-27 ENCOUNTER — Other Ambulatory Visit: Payer: Self-pay | Admitting: Physician Assistant

## 2023-01-02 ENCOUNTER — Encounter (INDEPENDENT_AMBULATORY_CARE_PROVIDER_SITE_OTHER): Payer: Self-pay

## 2023-01-10 ENCOUNTER — Other Ambulatory Visit: Payer: Self-pay | Admitting: Oncology

## 2023-01-21 ENCOUNTER — Other Ambulatory Visit: Payer: PPO

## 2023-01-21 ENCOUNTER — Ambulatory Visit: Payer: PPO | Admitting: Oncology

## 2023-01-23 ENCOUNTER — Other Ambulatory Visit: Payer: Self-pay | Admitting: *Deleted

## 2023-01-23 DIAGNOSIS — D473 Essential (hemorrhagic) thrombocythemia: Secondary | ICD-10-CM

## 2023-01-24 ENCOUNTER — Inpatient Hospital Stay: Payer: PPO | Attending: Oncology

## 2023-01-24 ENCOUNTER — Encounter: Payer: Self-pay | Admitting: Oncology

## 2023-01-24 ENCOUNTER — Other Ambulatory Visit: Payer: Self-pay | Admitting: Oncology

## 2023-01-24 ENCOUNTER — Inpatient Hospital Stay: Payer: PPO | Admitting: Oncology

## 2023-01-24 VITALS — BP 130/60 | HR 58 | Temp 97.2°F | Resp 16 | Ht 59.0 in | Wt 155.0 lb

## 2023-01-24 DIAGNOSIS — Z87891 Personal history of nicotine dependence: Secondary | ICD-10-CM | POA: Insufficient documentation

## 2023-01-24 DIAGNOSIS — Z8616 Personal history of COVID-19: Secondary | ICD-10-CM | POA: Insufficient documentation

## 2023-01-24 DIAGNOSIS — Z803 Family history of malignant neoplasm of breast: Secondary | ICD-10-CM | POA: Diagnosis not present

## 2023-01-24 DIAGNOSIS — I519 Heart disease, unspecified: Secondary | ICD-10-CM | POA: Insufficient documentation

## 2023-01-24 DIAGNOSIS — D75839 Thrombocytosis, unspecified: Secondary | ICD-10-CM | POA: Insufficient documentation

## 2023-01-24 DIAGNOSIS — D473 Essential (hemorrhagic) thrombocythemia: Secondary | ICD-10-CM

## 2023-01-24 LAB — CBC WITH DIFFERENTIAL/PLATELET
Abs Immature Granulocytes: 0.04 10*3/uL (ref 0.00–0.07)
Basophils Absolute: 0 10*3/uL (ref 0.0–0.1)
Basophils Relative: 1 %
Eosinophils Absolute: 0.3 10*3/uL (ref 0.0–0.5)
Eosinophils Relative: 4 %
HCT: 38.9 % (ref 36.0–46.0)
Hemoglobin: 12.6 g/dL (ref 12.0–15.0)
Immature Granulocytes: 1 %
Lymphocytes Relative: 27 %
Lymphs Abs: 2.3 10*3/uL (ref 0.7–4.0)
MCH: 32.1 pg (ref 26.0–34.0)
MCHC: 32.4 g/dL (ref 30.0–36.0)
MCV: 99 fL (ref 80.0–100.0)
Monocytes Absolute: 0.5 10*3/uL (ref 0.1–1.0)
Monocytes Relative: 6 %
Neutro Abs: 5.3 10*3/uL (ref 1.7–7.7)
Neutrophils Relative %: 61 %
Platelets: 1220 10*3/uL (ref 150–400)
RBC: 3.93 MIL/uL (ref 3.87–5.11)
RDW: 15.2 % (ref 11.5–15.5)
WBC: 8.5 10*3/uL (ref 4.0–10.5)
nRBC: 0 % (ref 0.0–0.2)

## 2023-01-24 MED ORDER — HYDROXYUREA 500 MG PO CAPS: 1500.0000 mg | ORAL_CAPSULE | Freq: Every day | ORAL | 2 refills | Status: DC

## 2023-01-24 NOTE — Telephone Encounter (Signed)
CBC with Differential/Platelet Order: [id number] Status: Final result     Visible to patient: Yes (not seen)     Next appt: Today at 10:45 AM in Oncology ([facility])     Dx: Hemochromatosis, unspecified hemochro...   0 Result Notes          Component Ref Range & Units 2 mo ago (11/05/22) 3 mo ago (10/07/22) 4 mo ago (09/05/22) 5 mo ago (08/13/22) 7 mo ago (06/20/22) 7 mo ago (06/05/22) 7 mo ago (06/04/22)  WBC 4.0 - 10.5 K/uL 8.3 6.7 8.3 9.1 10.6 High  8.0 10.4  RBC 3.87 - 5.11 MIL/uL 4.09 3.83 Low  R 4.25 4.42 R 4.38 3.90 4.13  Hemoglobin 12.0 - 15.0 g/dL 40.9 81.1 91.4 78.2 95.6 11.6 Low  12.2  HCT 36.0 - 46.0 % 39.6 35.9 Low  39.9 40.1 39.0 35.4 Low  38.1  MCV 80.0 - 100.0 fL 96.8 93.7 R 93.9 90.6 R 89.0 90.8 92.3  MCH 26.0 - 34.0 pg 32.0  30.4  29.5 29.7 29.5  MCHC 30.0 - 36.0 g/dL 21.3 08.6 57.8 46.9 62.9 32.8 32.0  RDW 11.5 - 15.5 % 16.3 High  19.5 High  18.1 High  19.2 High  15.8 High  15.8 High  16.0 High   Platelets 150 - 400 K/uL 1,014 High Panic  901.0 High  R 1,182 High Panic  CM 1022.0 Repeated and verified X2. High Panic  R 1,425 High Panic  CM 1,173 High Panic  CM 1,377 High Panic  CM  Comment: REPEATED TO VERIFY THIS CRITICAL RESULT HAS VERIFIED AND BEEN CALLED TO KENDRA YORK BY KIM ROOS ON 06 18 2024 AT 1412, AND HAS BEEN READ BACK.  nRBC 0.0 - 0.2 % 0.0  0.0  0.0 0.0 CM 0.0 CM  Neutrophils Relative % % 68 62.1 R 65 66.3 R 69    Neutro Abs 1.7 - 7.7 K/uL 5.7 4.2 R 5.4 6.1 R 7.5    Lymphocytes Relative % 23 28.5 R 26 23.4 R 18    Lymphs Abs 0.7 - 4.0 K/uL 1.9 1.9 2.1 2.1 1.9    Monocytes Relative % 5 5.4 R 5 5.7 R 7    Monocytes Absolute 0.1 - 1.0 K/uL 0.4 0.4 0.4 0.5 0.7    Eosinophils Relative % 3 3.4 R 3 4.1 R 3    Eosinophils Absolute 0.0 - 0.5 K/uL 0.3 0.2 R 0.3 0.4 R 0.3    Basophils Relative % 1 0.6 R 1 0.5 R 1    Basophils Absolute 0.0 - 0.1 K/uL 0.0 0.0 0.0 0.0 0.1    Immature Granulocytes % 0  0  2    Abs Immature Granulocytes 0.00 -  0.07 K/uL 0.02  0.03 CM  0.17 High  CM    Comment: Performed at [facility], [address] [place], [place] [address]  Resulting Agency [facility] [place] [facility] [place] [facility]         Specimen Collected: 11/05/22 13:51 Last Resulted: 11/05/22 14:13

## 2023-01-24 NOTE — Progress Notes (Signed)
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  INTERVAL HISTORY: Patient returns to clinic today for repeat laboratory work, further evaluation, and consideration of increasing her Hydrea dose.  She recently spent 1 month in [place].  She currently feels well and is asymptomatic.  She continues to tolerate Hydrea without significant side effects. She has no neurologic complaints.  She denies any recent fevers or illnesses.  She has a good appetite and denies weight loss.  She has no chest pain, shortness of breath, cough, or hemoptysis.  She denies any nausea, vomiting, constipation, or diarrhea.  She has no urinary complaints.  Patient offers no specific complaints today.  REVIEW OF SYSTEMS:   Review of Systems  Constitutional: Negative.  Negative for fever, malaise/fatigue and weight loss.  Respiratory: Negative.  Negative for cough, hemoptysis and shortness of breath.   Cardiovascular: Negative.  Negative for chest pain and leg swelling.  Gastrointestinal: Negative.  Negative for abdominal pain.  Genitourinary: Negative.  Negative for dysuria.  Musculoskeletal: Negative.  Negative for back pain and myalgias.  Skin: Negative.  Negative for rash.  Neurological: Negative.  Negative for dizziness, focal weakness, weakness and headaches.  Psychiatric/Behavioral: Negative.  The patient is not nervous/anxious.     As per HPI. Otherwise, a complete review of systems is negative.  PAST MEDICAL HISTORY: Past Medical History:  Diagnosis Date   Arthritis     Asthma    Cancer (HCC) 1990   COVID-19 06/14/2020   Depression    Hx of breast cancer 04/07/2018   1990. No screening in last 25 years    PAST SURGICAL HISTORY: Past Surgical History:  Procedure Laterality Date   BREAST SURGERY Left 1990   CORONARY ARTERY BYPASS GRAFT     JOINT REPLACEMENT Right    Hip   LEFT HEART CATH AND CORS/GRAFTS ANGIOGRAPHY N/A 06/05/2022   Procedure: LEFT HEART CATH AND CORS/GRAFTS ANGIOGRAPHY;  Surgeon: Iran Ouch, MD;  Location: [facility];  Service: Cardiovascular;  Laterality: N/A;   OVARIAN CYST REMOVAL  1970   TUBAL LIGATION  1970    FAMILY HISTORY: Family History  Problem Relation Age of Onset   Cancer Father        lung   Heart attack Father 50   Alcohol abuse Father    Arthritis Father    COPD Father    Diabetes Father    Hypertension Father    Breast cancer Sister 20   Arthritis Sister    Cancer Sister    COPD Sister    Drug abuse Sister    Hypertension Sister    Miscarriages / [place] Sister    Thyroid disease Sister    Arthritis Mother    COPD Mother    Diabetes Mother    Hearing loss Mother    Hypertension Mother    Miscarriages / [place] Mother    Stroke Mother    Alcohol abuse Brother    Arthritis Brother    Hypertension Brother    Arthritis Daughter    Asthma Daughter    Depression Daughter    Hypertension Daughter    Miscarriages / [place] Daughter  Thyroid disease Daughter    Arthritis Maternal Grandmother    Hypertension Maternal Grandmother    Stroke Maternal Grandmother    Thyroid disease Maternal Grandmother    Alcohol abuse Maternal Grandfather    Arthritis Maternal Grandfather    Cancer Maternal Grandfather    Depression Maternal Grandfather    Hearing loss Maternal Grandfather    Hypertension Maternal Grandfather    Arthritis Sister    Thyroid disease Sister    Thyroid disease Daughter    Hypertension Daughter    Depression Daughter    COPD Daughter    Alcohol abuse  Daughter    Arthritis Daughter    Arthritis Daughter    COPD Daughter    Depression Daughter    Hearing loss Daughter    Hypertension Daughter    Thyroid disease Daughter     ADVANCED DIRECTIVES (Y/N):  N  HEALTH MAINTENANCE: Social History   Tobacco Use   Smoking status: Former    Current packs/day: 0.00    Average packs/day: 1 pack/day for 40.0 years (40.0 ttl pk-yrs)    Types: Cigarettes    Start date: 44    Quit date: 2002    Years since quitting: 22.6   Smokeless tobacco: Never  Vaping Use   Vaping status: Never Used  Substance Use Topics   Alcohol use: Not Currently   Drug use: Never     Colonoscopy:  PAP:  Bone density:  Lipid panel:  Allergies  Allergen Reactions   Advair Diskus [Fluticasone-Salmeterol] Shortness Of Breath    As of 09/01/2014. Pt can take Breo   Levofloxacin Other (See Comments)    Nausea, brain fog, depression, as of 12/12/14   Qvar [Beclomethasone] Other (See Comments)    Lungs closing-as of 05/03/2015   Singulair [Montelukast] Shortness Of Breath   Symbicort [Budesonide-Formoterol Fumarate] Other (See Comments)    Hard time breathing as of 10/31/2014.    Current Outpatient Medications  Medication Sig Dispense Refill   acetaminophen (TYLENOL) 500 MG tablet Take 1 tablet (500 mg total) by mouth 3 (three) times daily as needed for moderate pain.     albuterol (PROVENTIL) (2.5 MG/3ML) 0.083% nebulizer solution Take 3 mLs (2.5 mg total) by nebulization every 6 (six) hours as needed for shortness of breath. 150 mL 1   albuterol (VENTOLIN HFA) 108 (90 Base) MCG/ACT inhaler Inhale 2 puffs into the lungs every 4 (four) hours as needed for wheezing or shortness of breath. 6.7 g 3   aspirin 81 MG chewable tablet Chew 1 tablet (81 mg total) by mouth daily.     carvedilol (COREG) 6.25 MG tablet Take 1 tablet (6.25 mg total) by mouth 2 (two) times daily with a meal. 60 tablet 11   Cholecalciferol (VITAMIN D3) 25 MCG (1000 UT) capsule Take 2  capsules (2,000 Units total) by mouth daily. 30 capsule    clopidogrel (PLAVIX) 75 MG tablet TAKE 1 TABLET BY MOUTH DAILY WITH BREAKFAST. 90 tablet 2   ezetimibe (ZETIA) 10 MG tablet Take 1 tablet (10 mg total) by mouth daily. 90 tablet 3   fluticasone (FLONASE) 50 MCG/ACT nasal spray Place 2 sprays into both nostrils daily. 16 g 6   Fluticasone-Umeclidin-Vilant (TRELEGY ELLIPTA) 100-62.5-25 MCG/ACT AEPB Inhale 1 puff into the lungs daily. 3 each 3   loratadine (CLARITIN) 10 MG tablet Take 10 mg by mouth daily.     Multiple Vitamin (MULTIVITAMIN) tablet Take 1 tablet by mouth daily.     Multiple Vitamins-Minerals (PRESERVISION AREDS 2)  CAPS Take 2 each by mouth daily at 12 noon.     nitroGLYCERIN (NITROSTAT) 0.4 MG SL tablet Place 1 tablet (0.4 mg total) under the tongue every 5 (five) minutes as needed for chest pain. 25 tablet 3   patiromer (VELTASSA) 8.4 g packet Take 1 packet (8.4 g total) by mouth daily. 4 each 0   rosuvastatin (CRESTOR) 20 MG tablet Take 1 tablet (20 mg total) by mouth at bedtime. 90 tablet 3   sacubitril-valsartan (ENTRESTO) 24-26 MG Take 1 tablet by mouth 2 (two) times daily. 60 tablet 11   hydroxyurea (HYDREA) 500 MG capsule Take 3 capsules (1,500 mg total) by mouth daily. May take with food to minimize GI side effects. 90 capsule 2   No current facility-administered medications for this visit.    OBJECTIVE: Vitals:   01/24/23 1046  BP: 130/60  Pulse: (!) 58  Resp: 16  Temp: (!) 97.2 F (36.2 C)  SpO2: 97%     Body mass index is 31.31 kg/m.    ECOG FS:0 - Asymptomatic  General: Well-developed, well-nourished, no acute distress. Eyes: Pink conjunctiva, anicteric sclera. HEENT: Normocephalic, moist mucous membranes. Lungs: No audible wheezing or coughing. Heart: Regular rate and rhythm. Abdomen: Soft, nontender, no obvious distention. Musculoskeletal: No edema, cyanosis, or clubbing. Neuro: Alert, answering all questions appropriately. Cranial nerves  grossly intact. Skin: No rashes or petechiae noted. Psych: Normal affect.  LAB RESULTS:  Lab Results  Component Value Date   NA 138 09/18/2022   K 4.8 09/18/2022   CL 106 09/18/2022   CO2 26 09/18/2022   GLUCOSE 112 (H) 09/18/2022   BUN 18 09/18/2022   CREATININE 0.79 09/18/2022   CALCIUM 10.6 (H) 09/18/2022   PROT 6.6 08/13/2022   ALBUMIN 4.0 08/13/2022   AST 28 08/13/2022   ALT 32 08/13/2022   ALKPHOS 109 08/13/2022   BILITOT 0.5 08/13/2022   GFRNONAA >60 06/06/2022   GFRAA >60 01/07/2020    Lab Results  Component Value Date   WBC 8.5 01/24/2023   NEUTROABS 5.3 01/24/2023   HGB 12.6 01/24/2023   HCT 38.9 01/24/2023   MCV 99.0 01/24/2023   PLT 1,220 (HH) 01/24/2023     STUDIES: No results found.  ASSESSMENT: Essential thrombocytosis with CALR mutation.  PLAN:    Essential thrombocytosis with CALR mutation: Patient's platelet count has increased to 1220.  After lengthy discussion with the patient, she has agreed to increase her Hydrea dose to 1500 mg daily.  Return to clinic in 1 month for repeat laboratory work, further evaluation, and assessment of her toleration of increased dose.    Cardiac disease: Being medically managed.  Follow-up with her cardiologist as indicated.  I spent a total of 20 minutes reviewing chart data, face-to-face evaluation with the patient, counseling and coordination of care as detailed above.   Patient expressed understanding and was in agreement with this plan. She also understands that She can call clinic at any time with any questions, concerns, or complaints.    Jeralyn Ruths, MD   01/24/2023 2:11 PM

## 2023-02-05 ENCOUNTER — Telehealth: Payer: Self-pay | Admitting: Family Medicine

## 2023-02-05 DIAGNOSIS — J411 Mucopurulent chronic bronchitis: Secondary | ICD-10-CM

## 2023-02-05 DIAGNOSIS — J4530 Mild persistent asthma, uncomplicated: Secondary | ICD-10-CM

## 2023-02-05 MED ORDER — FLUTICASONE FUROATE-VILANTEROL 200-25 MCG/ACT IN AEPB
1.0000 | INHALATION_SPRAY | Freq: Every day | RESPIRATORY_TRACT | 1 refills | Status: DC
Start: 2023-02-05 — End: 2023-07-31

## 2023-02-05 MED ORDER — SPIRIVA RESPIMAT 2.5 MCG/ACT IN AERS
2.0000 | INHALATION_SPRAY | Freq: Every day | RESPIRATORY_TRACT | 3 refills | Status: DC
Start: 2023-02-05 — End: 2023-12-29

## 2023-02-05 NOTE — Telephone Encounter (Signed)
Back in April pharmacist had changed from [place] + Spiriva to Trelegy for cost savings.  Pt back on Breo. Refilled. Recommend she also start spiriva - sent to pharmacy.

## 2023-02-05 NOTE — Telephone Encounter (Signed)
LOV: 08/13/2022 Pending OV: 02/14/2023 Medication Breo Ellipta 200-64mcg  Medication is not listed on pt's active medication list.

## 2023-02-05 NOTE — Telephone Encounter (Signed)
Please call pt - is she taking breo + spiriva or is she taking Trelegy (triple therapy combo inhaler)?

## 2023-02-05 NOTE — Telephone Encounter (Signed)
Spoke to pt, pt states she's only taking breo. Call back # [phone]

## 2023-02-05 NOTE — Telephone Encounter (Signed)
Lvm asking pt to call back.  Need to get answer to Dr. Timoteo Expose question.

## 2023-02-05 NOTE — Addendum Note (Signed)
Addended by: Eustaquio Boyden on: 02/05/2023 04:47 PM   Modules accepted: Orders

## 2023-02-06 NOTE — Telephone Encounter (Signed)
Spoke with pt relaying Dr. Timoteo Expose message. Pt verbalizes understanding.

## 2023-02-14 ENCOUNTER — Encounter: Payer: Self-pay | Admitting: Family Medicine

## 2023-02-14 ENCOUNTER — Ambulatory Visit (INDEPENDENT_AMBULATORY_CARE_PROVIDER_SITE_OTHER): Payer: PPO | Admitting: Family Medicine

## 2023-02-14 VITALS — BP 136/82 | HR 66 | Temp 97.7°F | Ht 59.0 in | Wt 151.1 lb

## 2023-02-14 DIAGNOSIS — D473 Essential (hemorrhagic) thrombocythemia: Secondary | ICD-10-CM

## 2023-02-14 DIAGNOSIS — H6123 Impacted cerumen, bilateral: Secondary | ICD-10-CM

## 2023-02-14 DIAGNOSIS — Z79899 Other long term (current) drug therapy: Secondary | ICD-10-CM | POA: Insufficient documentation

## 2023-02-14 DIAGNOSIS — Z23 Encounter for immunization: Secondary | ICD-10-CM | POA: Diagnosis not present

## 2023-02-14 DIAGNOSIS — Z8673 Personal history of transient ischemic attack (TIA), and cerebral infarction without residual deficits: Secondary | ICD-10-CM | POA: Diagnosis not present

## 2023-02-14 DIAGNOSIS — J411 Mucopurulent chronic bronchitis: Secondary | ICD-10-CM

## 2023-02-14 DIAGNOSIS — I5042 Chronic combined systolic (congestive) and diastolic (congestive) heart failure: Secondary | ICD-10-CM

## 2023-02-14 DIAGNOSIS — R2231 Localized swelling, mass and lump, right upper limb: Secondary | ICD-10-CM

## 2023-02-14 DIAGNOSIS — I2510 Atherosclerotic heart disease of native coronary artery without angina pectoris: Secondary | ICD-10-CM | POA: Diagnosis not present

## 2023-02-14 NOTE — Assessment & Plan Note (Addendum)
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  She continues breo ellipta, spiriva, with rare albuterol PRN.

## 2023-02-14 NOTE — Assessment & Plan Note (Addendum)
Appreciate cardiology care. Stable period, overall euvolemic, asxs. Due to establish with new provider - referral placed.

## 2023-02-14 NOTE — Patient Instructions (Addendum)
Flu shot today  New referral placed to establish with new cardiologist - you're due for follow up in October.  I will ask our pharmacist to reach out to see if you are eligible for any discounts to help with medication cost.  Return in 6 months for physical.

## 2023-02-14 NOTE — Assessment & Plan Note (Signed)
Upcoming appt next week for hearing aid cleaning.  Failed cerumen irrigation attempted today. Will refer to ENT for bilateral cerumen impaction.

## 2023-02-14 NOTE — Assessment & Plan Note (Signed)
Appreciate heme/onc care, continues hydroxyurea 1500mg  daily.

## 2023-02-14 NOTE — Assessment & Plan Note (Signed)
Appreciate cardiology care. Due to establish with new provider - referral placed.

## 2023-02-14 NOTE — Progress Notes (Signed)
Ph: [phone] Fax: [phone]   [name], female    DOB: [birth year], 82 y.o.   MRN: [id number]  This visit was conducted in person.  BP 136/82   Pulse 66   Temp 97.7 F (36.5 C) (Oral)   Ht 4\' 11"  (1.499 m)   Wt 151 lb 2 oz (68.5 kg)   SpO2 97%   BMI 30.52 kg/m    CC: 6 mo f/u visit  Subjective:   HPI: Nancy Merritt is a 82 y.o. female presenting on 02/14/2023 for Medical Management of Chronic Issues (Here for 6 mo f/u. Pt accompanied by daughter, Dorene Grebe. )   CAD s/p hypertensive emergency/NSTEMI 05/2022 regularly seeing cardiology Dr Shari Prows on Sherryll Burger, carvedilol, aspirin, plavix, zetia and crestor. H/o stroke 04/2020.   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   Asthma/COPD managed with breo ellipta 1 puff daily, spiriva respimat 2 puff daily, and albuterol PRN. Rare albuterol use. Finds she struggled with deteriorated respiratory status when she ran out of breo for a week.   No significant worsening dyspnea, cough, chest pain, leg swelling or orthopnea.   Having difficulty hearing - has upcoming appt on Monday with [facility].   Notes knot to right palm along 3rd MCP.      Relevant past medical, surgical, family and social history reviewed and updated as indicated. Interim medical history since our last visit reviewed. Allergies and medications reviewed and updated. Outpatient Medications Prior to Visit  Medication Sig Dispense Refill   acetaminophen (TYLENOL) 500 MG tablet Take 1 tablet (500 mg total) by mouth 3 (three) times daily as needed for moderate pain.     albuterol (PROVENTIL) (2.5 MG/3ML) 0.083% nebulizer solution Take 3 mLs (2.5 mg total) by nebulization every 6 (six) hours as needed for shortness of breath. 150 mL 1   albuterol (VENTOLIN HFA) 108 (90 Base) MCG/ACT inhaler Inhale 2 puffs into the lungs every 4 (four) hours as needed for wheezing or shortness of breath. 6.7 g 3   aspirin  81 MG chewable tablet Chew 1 tablet (81 mg total) by mouth daily.     carvedilol (COREG) 6.25 MG tablet Take 1 tablet (6.25 mg total) by mouth 2 (two) times daily with a meal. 60 tablet 11   Cholecalciferol (VITAMIN D3) 25 MCG (1000 UT) capsule Take 2 capsules (2,000 Units total) by mouth daily. 30 capsule    clopidogrel (PLAVIX) 75 MG tablet TAKE 1 TABLET BY MOUTH DAILY WITH BREAKFAST. 90 tablet 2   ezetimibe (ZETIA) 10 MG tablet Take 1 tablet (10 mg total) by mouth daily. 90 tablet 3   fluticasone (FLONASE) 50 MCG/ACT nasal spray Place 2 sprays into both nostrils daily. 16 g 6   fluticasone furoate-vilanterol (BREO ELLIPTA) 200-25 MCG/ACT AEPB Inhale 1 puff into the lungs daily. 180 each 1   hydroxyurea (HYDREA) 500 MG capsule Take 3 capsules (1,500 mg total) by mouth daily. May take with food to minimize GI side effects. 90 capsule 2   loratadine (CLARITIN) 10 MG tablet Take 10 mg by mouth daily.     Multiple Vitamin (MULTIVITAMIN) tablet Take 1 tablet by mouth daily.     Multiple Vitamins-Minerals (PRESERVISION AREDS 2) CAPS Take 2 each by mouth daily at 12 noon.     nitroGLYCERIN (NITROSTAT) 0.4 MG SL tablet Place 1 tablet (0.4 mg total) under the tongue every 5 (five) minutes as needed for chest pain. 25 tablet 3   rosuvastatin (CRESTOR) 20 MG  tablet Take 1 tablet (20 mg total) by mouth at bedtime. 90 tablet 3   sacubitril-valsartan (ENTRESTO) 24-26 MG Take 1 tablet by mouth 2 (two) times daily. 60 tablet 11   Tiotropium Bromide Monohydrate (SPIRIVA RESPIMAT) 2.5 MCG/ACT AERS Inhale 2 puffs into the lungs daily. 12 g 3   patiromer (VELTASSA) 8.4 g packet Take 1 packet (8.4 g total) by mouth daily. 4 each 0   No facility-administered medications prior to visit.     Per HPI unless specifically indicated in ROS section below Review of Systems  Objective:  BP 136/82   Pulse 66   Temp 97.7 F (36.5 C) (Oral)   Ht 4\' 11"  (1.499 m)   Wt 151 lb 2 oz (68.5 kg)   SpO2 97%   BMI 30.52 kg/m    Wt Readings from Last 3 Encounters:  02/14/23 151 lb 2 oz (68.5 kg)  01/24/23 155 lb (70.3 kg)  11/05/22 157 lb (71.2 kg)      Physical Exam Vitals and nursing note reviewed.  Constitutional:      Appearance: Normal appearance. She is not ill-appearing.  HENT:     Head: Normocephalic and atraumatic.     Right Ear: Decreased hearing noted. There is impacted cerumen.     Left Ear: Decreased hearing noted. There is impacted cerumen.     Mouth/Throat:     Mouth: Mucous membranes are moist.     Pharynx: Oropharynx is clear. No oropharyngeal exudate or posterior oropharyngeal erythema.     Comments: Full dentures Eyes:     Extraocular Movements: Extraocular movements intact.     Pupils: Pupils are equal, round, and reactive to light.  Cardiovascular:     Rate and Rhythm: Normal rate and regular rhythm.     Pulses: Normal pulses.     Heart sounds: Normal heart sounds. No murmur heard. Pulmonary:     Effort: Pulmonary effort is normal. No respiratory distress.     Breath sounds: Normal breath sounds. No wheezing, rhonchi or rales.  Musculoskeletal:     Cervical back: Normal range of motion and neck supple.     Right lower leg: No edema.     Left lower leg: No edema.  Lymphadenopathy:     Cervical: No cervical adenopathy.  Skin:    General: Skin is warm and dry.     Findings: No rash.  Neurological:     Mental Status: She is alert.  Psychiatric:        Mood and Affect: Mood normal.        Behavior: Behavior normal.       Results for orders placed or performed in visit on 01/24/23  CBC with Differential/Platelet  Result Value Ref Range   WBC 8.5 4.0 - 10.5 K/uL   RBC 3.93 3.87 - 5.11 MIL/uL   Hemoglobin 12.6 12.0 - 15.0 g/dL   HCT 16.1 09.6 - 04.5 %   MCV 99.0 80.0 - 100.0 fL   MCH 32.1 26.0 - 34.0 pg   MCHC 32.4 30.0 - 36.0 g/dL   RDW 40.9 81.1 - 91.4 %   Platelets 1,220 (HH) 150 - 400 K/uL   nRBC 0.0 0.0 - 0.2 %   Neutrophils Relative % 61 %   Neutro Abs 5.3 1.7  - 7.7 K/uL   Lymphocytes Relative 27 %   Lymphs Abs 2.3 0.7 - 4.0 K/uL   Monocytes Relative 6 %   Monocytes Absolute 0.5 0.1 - 1.0 K/uL   Eosinophils Relative  4 %   Eosinophils Absolute 0.3 0.0 - 0.5 K/uL   Basophils Relative 1 %   Basophils Absolute 0.0 0.0 - 0.1 K/uL   Immature Granulocytes 1 %   Abs Immature Granulocytes 0.04 0.00 - 0.07 K/uL    Assessment & Plan:   Problem List Items Addressed This Visit     CAD (coronary artery disease)    Appreciate cardiology care. Due to establish with new provider - referral placed.       Relevant Orders   Ambulatory referral to Cardiology   COPD (chronic obstructive pulmonary disease) (HCC)    Possible combined COPD/asthma given reversibility noted on prior PFTs.  She continues breo ellipta, spiriva, with rare albuterol PRN.       Relevant Orders   AMB Referral to [facility] Coordinaton ([facility] Patients)   History of CVA (cerebrovascular accident) without residual deficits    Continue aspirin, statin, plavix, zetia       Hearing loss of both ears due to cerumen impaction    Upcoming appt next week for hearing aid cleaning.  Failed cerumen irrigation attempted today. Will refer to ENT for bilateral cerumen impaction.       Relevant Orders   Ambulatory referral to ENT   Essential thrombocytosis (HCC)    Appreciate heme/onc care, continues hydroxyurea 1500mg  daily.       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 Stable period, overall euvolemic, asxs. Due to establish with new provider - referral placed.       Relevant Orders   Ambulatory referral to Cardiology   AMB Referral to [facility] Coordinaton ([facility] Patients)   Polypharmacy    Pt endorses trouble affording meds especially Breo Ellipta and Entresto. Will place referral to pharmacy team to investigate medication assistance options.       Relevant Orders   AMB Referral to [facility]  ([facility] Patients)   Other Visit Diagnoses     Encounter for immunization       Relevant Orders   Flu Vaccine Trivalent High Dose (Fluad) (Completed)        No orders of the defined types were placed in this encounter.   Orders Placed This Encounter  Procedures   Flu Vaccine Trivalent High Dose (Fluad)   Ambulatory referral to Cardiology    Referral Priority:   Routine    Referral Type:   Consultation    Referral Reason:   Specialty Services Required    Number of Visits Requested:   1   Ambulatory referral to ENT    Referral Priority:   Routine    Referral Type:   Consultation    Referral Reason:   Specialty Services Required    Requested Specialty:   Otolaryngology    Number of Visits Requested:   1   AMB Referral to [facility] Coordinaton ([facility] Patients)    Referral Priority:   Routine    Referral Type:   Consultation    Referral Reason:   Care Coordination    Number of Visits Requested:   1    Patient Instructions  Flu shot today  New referral placed to establish with new cardiologist - you're due for follow up in October.  I will ask our pharmacist to reach out to see if you are eligible for any discounts to help with medication cost.  Return in 6 months for physical.   Follow up plan: Return in about 6 months (  around 08/14/2023) for annual exam, prior fasting for blood work.  Eustaquio Boyden, MD

## 2023-02-14 NOTE — Assessment & Plan Note (Signed)
Continue aspirin, statin, plavix, zetia

## 2023-02-14 NOTE — Assessment & Plan Note (Signed)
Pt endorses trouble affording meds especially Breo Ellipta and Entresto. Will place referral to pharmacy team to investigate medication assistance options.

## 2023-02-15 DIAGNOSIS — R2231 Localized swelling, mass and lump, right upper limb: Secondary | ICD-10-CM | POA: Insufficient documentation

## 2023-02-15 NOTE — Assessment & Plan Note (Addendum)
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  Currently asxs, pt does endorse occasional triggering of 3rd R digit.  To see sports medicine for steroid injection consideration if ongoing/worsening symptoms.

## 2023-02-17 ENCOUNTER — Telehealth: Payer: Self-pay

## 2023-02-17 NOTE — Progress Notes (Unsigned)
Care Guide Note  02/17/2023 Name: Nancy Merritt MRN: [id number] DOB: 1940/07/20  Referred by: Eustaquio Boyden, MD Reason for referral : Care Coordination (Outreach to schedule with Pharm d )   Nancy Merritt is a 82 y.o. year old female who is a primary care patient of Eustaquio Boyden, MD. Nancy Merritt was referred to the pharmacist for assistance related to HTN and COPD.    An unsuccessful telephone outreach was attempted today to contact the patient who was referred to the pharmacy team for assistance with medication assistance. Additional attempts will be made to contact the patient.   Penne Lash, RMA Care Guide [facility]  [place], [place] [address] Direct Dial: [phone] [email or web address]

## 2023-02-19 NOTE — Progress Notes (Signed)
Care Guide Note  02/19/2023 Name: [place] Judah MRN: [id number] DOB: 08-Aug-1940  Referred by: Eustaquio Boyden, MD Reason for referral : Care Coordination (Outreach to schedule with Pharm d )   Grizel Forys is a 82 y.o. year old female who is a primary care patient of Eustaquio Boyden, MD. Alexei Hetzel was referred to the pharmacist for assistance related to HTN.    Successful contact was made with the patient to discuss pharmacy services including being ready for the pharmacist to call at least 5 minutes before the scheduled appointment time, to have medication bottles and any blood sugar or blood pressure readings ready for review. The patient agreed to meet with the pharmacist via with the pharmacist via telephone visit on (date/time).  04/02/2023  Penne Lash, RMA Care Guide [facility]  [place], [place] [address] Direct Dial: [phone] [email or web address]

## 2023-02-25 ENCOUNTER — Inpatient Hospital Stay: Payer: PPO

## 2023-02-25 ENCOUNTER — Inpatient Hospital Stay: Payer: PPO | Admitting: Oncology

## 2023-03-04 ENCOUNTER — Encounter: Payer: Self-pay | Admitting: Oncology

## 2023-03-04 ENCOUNTER — Inpatient Hospital Stay: Payer: PPO | Attending: Oncology

## 2023-03-04 ENCOUNTER — Inpatient Hospital Stay: Payer: PPO | Admitting: Oncology

## 2023-03-04 VITALS — BP 154/82 | HR 58 | Temp 97.6°F | Resp 18 | Ht 59.0 in | Wt 157.8 lb

## 2023-03-04 DIAGNOSIS — Z853 Personal history of malignant neoplasm of breast: Secondary | ICD-10-CM | POA: Insufficient documentation

## 2023-03-04 DIAGNOSIS — Z801 Family history of malignant neoplasm of trachea, bronchus and lung: Secondary | ICD-10-CM | POA: Diagnosis not present

## 2023-03-04 DIAGNOSIS — D75839 Thrombocytosis, unspecified: Secondary | ICD-10-CM | POA: Diagnosis present

## 2023-03-04 DIAGNOSIS — Z803 Family history of malignant neoplasm of breast: Secondary | ICD-10-CM | POA: Diagnosis not present

## 2023-03-04 DIAGNOSIS — Z87891 Personal history of nicotine dependence: Secondary | ICD-10-CM | POA: Insufficient documentation

## 2023-03-04 DIAGNOSIS — Z809 Family history of malignant neoplasm, unspecified: Secondary | ICD-10-CM | POA: Insufficient documentation

## 2023-03-04 DIAGNOSIS — D473 Essential (hemorrhagic) thrombocythemia: Secondary | ICD-10-CM | POA: Insufficient documentation

## 2023-03-04 DIAGNOSIS — Z8616 Personal history of COVID-19: Secondary | ICD-10-CM | POA: Diagnosis not present

## 2023-03-04 LAB — CBC WITH DIFFERENTIAL/PLATELET
Abs Immature Granulocytes: 0.03 10*3/uL (ref 0.00–0.07)
Basophils Absolute: 0 10*3/uL (ref 0.0–0.1)
Basophils Relative: 1 %
Eosinophils Absolute: 0.3 10*3/uL (ref 0.0–0.5)
Eosinophils Relative: 5 %
HCT: 41.4 % (ref 36.0–46.0)
Hemoglobin: 13.3 g/dL (ref 12.0–15.0)
Immature Granulocytes: 0 %
Lymphocytes Relative: 34 %
Lymphs Abs: 2.3 10*3/uL (ref 0.7–4.0)
MCH: 31.2 pg (ref 26.0–34.0)
MCHC: 32.1 g/dL (ref 30.0–36.0)
MCV: 97.2 fL (ref 80.0–100.0)
Monocytes Absolute: 0.5 10*3/uL (ref 0.1–1.0)
Monocytes Relative: 7 %
Neutro Abs: 3.6 10*3/uL (ref 1.7–7.7)
Neutrophils Relative %: 53 %
Platelets: 1102 10*3/uL (ref 150–400)
RBC: 4.26 MIL/uL (ref 3.87–5.11)
RDW: 15.9 % — ABNORMAL HIGH (ref 11.5–15.5)
Smear Review: INCREASED
WBC: 6.7 10*3/uL (ref 4.0–10.5)
nRBC: 0 % (ref 0.0–0.2)

## 2023-03-04 LAB — PATHOLOGIST SMEAR REVIEW

## 2023-03-04 NOTE — Progress Notes (Signed)
[facility]  [phone] [phone]  ID: Jeanett Perry OB: 12-Mar-1941  MR#: [id number]  [facility]#:[id number]  Patient Care Team: Eustaquio Boyden, MD as PCP - General (Family Medicine) Meriam Sprague, MD (Inactive) as PCP - Cardiology (Cardiology) Luciano Cutter, MD as Consulting Physician (Pulmonary Disease) Kathyrn Sheriff, [facility] (Inactive) as Pharmacist (Pharmacist) Jeralyn Ruths, MD as Consulting Physician (Oncology)  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  INTERVAL HISTORY: Patient returns to clinic today for repeat laboratory work, further evaluation, and to assess her toleration of her increased Hydrea dose.  She is now taking 1500 mg daily and tolerating treatment well.  She currently feels well and is asymptomatic.  She has no neurologic complaints.  She denies any recent fevers or illnesses.  She has a good appetite and denies weight loss.  She has no chest pain, shortness of breath, cough, or hemoptysis.  She denies any nausea, vomiting, constipation, or diarrhea.  She has no urinary complaints.  Patient offers no specific complaints today.  REVIEW OF SYSTEMS:   Review of Systems  Constitutional: Negative.  Negative for fever, malaise/fatigue and weight loss.  Respiratory: Negative.  Negative for cough, hemoptysis and shortness of breath.   Cardiovascular: Negative.  Negative for chest pain and leg swelling.  Gastrointestinal: Negative.  Negative for abdominal pain.  Genitourinary: Negative.  Negative for dysuria.  Musculoskeletal: Negative.  Negative for back pain and myalgias.  Skin: Negative.  Negative for rash.  Neurological: Negative.  Negative for dizziness, focal weakness, weakness and headaches.  Psychiatric/Behavioral: Negative.  The patient is not nervous/anxious.     As per HPI. Otherwise, a complete review of systems is negative.  PAST MEDICAL HISTORY: Past Medical History:   Diagnosis Date   Arthritis    Asthma    Cancer (HCC) 1990   COVID-19 06/14/2020   Depression    Hx of breast cancer 04/07/2018   1990. No screening in last 25 years    PAST SURGICAL HISTORY: Past Surgical History:  Procedure Laterality Date   BREAST SURGERY Left 1990   CORONARY ARTERY BYPASS GRAFT     JOINT REPLACEMENT Right    Hip   LEFT HEART CATH AND CORS/GRAFTS ANGIOGRAPHY N/A 06/05/2022   Procedure: LEFT HEART CATH AND CORS/GRAFTS ANGIOGRAPHY;  Surgeon: Iran Ouch, MD;  Location: [facility] INVASIVE CV LAB;  Service: Cardiovascular;  Laterality: N/A;   OVARIAN CYST REMOVAL  1970   TUBAL LIGATION  1970    FAMILY HISTORY: Family History  Problem Relation Age of Onset   Cancer Father        lung   Heart attack Father 56   Alcohol abuse Father    Arthritis Father    COPD Father    Diabetes Father    Hypertension Father    Breast cancer Sister 34   Arthritis Sister    Cancer Sister    COPD Sister    Drug abuse Sister    Hypertension Sister    Miscarriages / [place] Sister    Thyroid disease Sister    Arthritis Mother    COPD Mother    Diabetes Mother    Hearing loss Mother    Hypertension Mother    Miscarriages / [place] Mother    Stroke Mother    Alcohol abuse Brother    Arthritis Brother    Hypertension Brother    Arthritis Daughter    Asthma Daughter    Depression Daughter    Hypertension Daughter  Miscarriages / [place] Daughter    Thyroid disease Daughter    Arthritis Maternal Grandmother    Hypertension Maternal Grandmother    Stroke Maternal Grandmother    Thyroid disease Maternal Grandmother    Alcohol abuse Maternal Grandfather    Arthritis Maternal Grandfather    Cancer Maternal Grandfather    Depression Maternal Grandfather    Hearing loss Maternal Grandfather    Hypertension Maternal Grandfather    Arthritis Sister    Thyroid disease Sister    Thyroid disease Daughter    Hypertension Daughter    Depression Daughter     COPD Daughter    Alcohol abuse Daughter    Arthritis Daughter    Arthritis Daughter    COPD Daughter    Depression Daughter    Hearing loss Daughter    Hypertension Daughter    Thyroid disease Daughter     ADVANCED DIRECTIVES (Y/N):  N  HEALTH MAINTENANCE: Social History   Tobacco Use   Smoking status: Former    Current packs/day: 0.00    Average packs/day: 1 pack/day for 40.0 years (40.0 ttl pk-yrs)    Types: Cigarettes    Start date: 65    Quit date: 2002    Years since quitting: 22.8   Smokeless tobacco: Never  Vaping Use   Vaping status: Never Used  Substance Use Topics   Alcohol use: Not Currently   Drug use: Never     Colonoscopy:  PAP:  Bone density:  Lipid panel:  Allergies  Allergen Reactions   Advair Diskus [Fluticasone-Salmeterol] Shortness Of Breath    As of 09/01/2014. Pt can take Breo   Levofloxacin Other (See Comments)    Nausea, brain fog, depression, as of 12/12/14   Qvar [Beclomethasone] Other (See Comments)    Lungs closing-as of 05/03/2015   Singulair [Montelukast] Shortness Of Breath   Symbicort [Budesonide-Formoterol Fumarate] Other (See Comments)    Hard time breathing as of 10/31/2014.    Current Outpatient Medications  Medication Sig Dispense Refill   acetaminophen (TYLENOL) 500 MG tablet Take 1 tablet (500 mg total) by mouth 3 (three) times daily as needed for moderate pain.     albuterol (PROVENTIL) (2.5 MG/3ML) 0.083% nebulizer solution Take 3 mLs (2.5 mg total) by nebulization every 6 (six) hours as needed for shortness of breath. 150 mL 1   albuterol (VENTOLIN HFA) 108 (90 Base) MCG/ACT inhaler Inhale 2 puffs into the lungs every 4 (four) hours as needed for wheezing or shortness of breath. 6.7 g 3   aspirin 81 MG chewable tablet Chew 1 tablet (81 mg total) by mouth daily.     carvedilol (COREG) 6.25 MG tablet Take 1 tablet (6.25 mg total) by mouth 2 (two) times daily with a meal. 60 tablet 11   Cholecalciferol (VITAMIN D3) 25 MCG  (1000 UT) capsule Take 2 capsules (2,000 Units total) by mouth daily. 30 capsule    clopidogrel (PLAVIX) 75 MG tablet TAKE 1 TABLET BY MOUTH DAILY WITH BREAKFAST. 90 tablet 2   ezetimibe (ZETIA) 10 MG tablet Take 1 tablet (10 mg total) by mouth daily. 90 tablet 3   fluticasone (FLONASE) 50 MCG/ACT nasal spray Place 2 sprays into both nostrils daily. 16 g 6   fluticasone furoate-vilanterol (BREO ELLIPTA) 200-25 MCG/ACT AEPB Inhale 1 puff into the lungs daily. 180 each 1   hydroxyurea (HYDREA) 500 MG capsule Take 3 capsules (1,500 mg total) by mouth daily. May take with food to minimize GI side effects. 90 capsule 2  loratadine (CLARITIN) 10 MG tablet Take 10 mg by mouth daily.     Multiple Vitamin (MULTIVITAMIN) tablet Take 1 tablet by mouth daily.     Multiple Vitamins-Minerals (PRESERVISION AREDS 2) CAPS Take 2 each by mouth daily at 12 noon.     nitroGLYCERIN (NITROSTAT) 0.4 MG SL tablet Place 1 tablet (0.4 mg total) under the tongue every 5 (five) minutes as needed for chest pain. 25 tablet 3   rosuvastatin (CRESTOR) 20 MG tablet Take 1 tablet (20 mg total) by mouth at bedtime. 90 tablet 3   sacubitril-valsartan (ENTRESTO) 24-26 MG Take 1 tablet by mouth 2 (two) times daily. 60 tablet 11   Tiotropium Bromide Monohydrate (SPIRIVA RESPIMAT) 2.5 MCG/ACT AERS Inhale 2 puffs into the lungs daily. 12 g 3   No current facility-administered medications for this visit.    OBJECTIVE: Vitals:   03/04/23 1029  BP: (!) 154/82  Pulse: (!) 58  Resp: 18  Temp: 97.6 F (36.4 C)  SpO2: 97%     Body mass index is 31.87 kg/m.    ECOG FS:0 - Asymptomatic  General: Well-developed, well-nourished, no acute distress. Eyes: Pink conjunctiva, anicteric sclera. HEENT: Normocephalic, moist mucous membranes. Lungs: No audible wheezing or coughing. Heart: Regular rate and rhythm. Abdomen: Soft, nontender, no obvious distention. Musculoskeletal: No edema, cyanosis, or clubbing. Neuro: Alert, answering all  questions appropriately. Cranial nerves grossly intact. Skin: No rashes or petechiae noted. Psych: Normal affect.  LAB RESULTS:  Lab Results  Component Value Date   NA 138 09/18/2022   K 4.8 09/18/2022   CL 106 09/18/2022   CO2 26 09/18/2022   GLUCOSE 112 (H) 09/18/2022   BUN 18 09/18/2022   CREATININE 0.79 09/18/2022   CALCIUM 10.6 (H) 09/18/2022   PROT 6.6 08/13/2022   ALBUMIN 4.0 08/13/2022   AST 28 08/13/2022   ALT 32 08/13/2022   ALKPHOS 109 08/13/2022   BILITOT 0.5 08/13/2022   GFRNONAA >60 06/06/2022   GFRAA >60 01/07/2020    Lab Results  Component Value Date   WBC 6.7 03/04/2023   NEUTROABS 3.6 03/04/2023   HGB 13.3 03/04/2023   HCT 41.4 03/04/2023   MCV 97.2 03/04/2023   PLT 1,102 (HH) 03/04/2023     STUDIES: No results found.  ASSESSMENT: Essential thrombocytosis with CALR mutation.  PLAN:    Essential thrombocytosis with CALR mutation: Patient's platelet count has only mildly improved to 1102 with an increased dose of Hydrea 1500 mg daily.  Will continue current dose.  Return to clinic in 2 months with repeat laboratory work, further evaluation, and consideration of increasing her dose further if necessary.  Cardiac disease: Being medically managed.  Follow-up with her cardiologist as indicated.  I spent a total of 20 minutes reviewing chart data, face-to-face evaluation with the patient, counseling and coordination of care as detailed above.    Patient expressed understanding and was in agreement with this plan. She also understands that She can call clinic at any time with any questions, concerns, or complaints.    Jeralyn Ruths, MD   03/04/2023 11:41 AM

## 2023-03-05 ENCOUNTER — Encounter: Payer: PPO | Admitting: Pharmacist

## 2023-03-14 ENCOUNTER — Encounter: Payer: Self-pay | Admitting: Family Medicine

## 2023-03-14 ENCOUNTER — Ambulatory Visit (INDEPENDENT_AMBULATORY_CARE_PROVIDER_SITE_OTHER): Payer: PPO | Admitting: Family Medicine

## 2023-03-14 VITALS — BP 154/78 | HR 60 | Temp 97.9°F | Ht 59.0 in | Wt 160.5 lb

## 2023-03-14 DIAGNOSIS — F331 Major depressive disorder, recurrent, moderate: Secondary | ICD-10-CM | POA: Insufficient documentation

## 2023-03-14 MED ORDER — SERTRALINE HCL 100 MG PO TABS: 100.0000 mg | ORAL_TABLET | Freq: Every day | ORAL | 6 refills | Status: DC

## 2023-03-14 NOTE — Progress Notes (Signed)
Ph: [phone] Fax: [phone]   [name], female    DOB: 13-Feb-1941, 82 y.o.   MRN: [id number]  This visit was conducted in person.  BP (!) 154/78   Pulse 60   Temp 97.9 F (36.6 C) (Oral)   Ht 4\' 11"  (1.499 m)   Wt 160 lb 8 oz (72.8 kg)   SpO2 98%   BMI 32.42 kg/m    CC: depression  Subjective:   HPI: Nancy Merritt is a 82 y.o. 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  Sees [facility] - told needed cerumen disimpaction.   H/o "episodes" of recurrent depressed mood in the past.  Notes worsening mood for the last few months. She regularly talks with daughter in [place] who suggested she talk with her doctor about restarting Zoloft.  No noted triggers, or stressful life event.  She notes she has more trouble in winter months than in summer.  Previous counseling wasn't very helpful.   Depressed mood, anhedonia, decreased energy, erratic sleep, binge eating, inattention. Trouble making decisions.   Previously on sertraline with good effect. She normally starts at 100mg  dose with benefit.   Occ passive suicidality, no active plan.  H/o SA when she was 56s living in [place] - none since.  H/o behavioral health hospitalization.      Relevant past medical, surgical, family and social history reviewed and updated as indicated. Interim medical history since our last visit reviewed. Allergies and medications reviewed and updated. Outpatient Medications Prior to Visit  Medication Sig Dispense Refill   acetaminophen (TYLENOL) 500 MG tablet Take 1 tablet (500 mg total) by mouth 3 (three) times daily as needed for moderate pain.     albuterol (PROVENTIL) (2.5 MG/3ML) 0.083% nebulizer solution Take 3 mLs (2.5 mg total) by nebulization every 6 (six) hours as needed for shortness of breath. 150 mL 1   albuterol (VENTOLIN HFA) 108 (90 Base)  MCG/ACT inhaler Inhale 2 puffs into the lungs every 4 (four) hours as needed for wheezing or shortness of breath. 6.7 g 3   aspirin 81 MG chewable tablet Chew 1 tablet (81 mg total) by mouth daily.     carvedilol (COREG) 6.25 MG tablet Take 1 tablet (6.25 mg total) by mouth 2 (two) times daily with a meal. 60 tablet 11   Cholecalciferol (VITAMIN D3) 25 MCG (1000 UT) capsule Take 2 capsules (2,000 Units total) by mouth daily. 30 capsule    clopidogrel (PLAVIX) 75 MG tablet TAKE 1 TABLET BY MOUTH DAILY WITH BREAKFAST. 90 tablet 2   ezetimibe (ZETIA) 10 MG tablet Take 1 tablet (10 mg total) by mouth daily. 90 tablet 3   fluticasone (FLONASE) 50 MCG/ACT nasal spray Place 2 sprays into both nostrils daily. 16 g 6   fluticasone furoate-vilanterol (BREO ELLIPTA) 200-25 MCG/ACT AEPB Inhale 1 puff into the lungs daily. 180 each 1   hydroxyurea (HYDREA) 500 MG capsule Take 3 capsules (1,500 mg total) by mouth daily. May take with food to minimize GI side effects. 90 capsule 2   loratadine (CLARITIN) 10 MG tablet Take 10 mg by mouth daily.     Multiple Vitamin (MULTIVITAMIN) tablet Take 1 tablet by mouth daily.     Multiple Vitamins-Minerals (PRESERVISION AREDS 2) CAPS Take 2 each by mouth daily at 12 noon.     nitroGLYCERIN (NITROSTAT) 0.4 MG SL tablet Place 1 tablet (0.4  mg total) under the tongue every 5 (five) minutes as needed for chest pain. 25 tablet 3   rosuvastatin (CRESTOR) 20 MG tablet Take 1 tablet (20 mg total) by mouth at bedtime. 90 tablet 3   sacubitril-valsartan (ENTRESTO) 24-26 MG Take 1 tablet by mouth 2 (two) times daily. 60 tablet 11   Tiotropium Bromide Monohydrate (SPIRIVA RESPIMAT) 2.5 MCG/ACT AERS Inhale 2 puffs into the lungs daily. 12 g 3   No facility-administered medications prior to visit.     Per HPI unless specifically indicated in ROS section below Review of Systems  Objective:  BP (!) 154/78   Pulse 60   Temp 97.9 F (36.6 C) (Oral)   Ht 4\' 11"  (1.499 m)   Wt 160  lb 8 oz (72.8 kg)   SpO2 98%   BMI 32.42 kg/m   Wt Readings from Last 3 Encounters:  03/14/23 160 lb 8 oz (72.8 kg)  03/04/23 157 lb 12.8 oz (71.6 kg)  02/14/23 151 lb 2 oz (68.5 kg)      Physical Exam Vitals and nursing note reviewed.  Constitutional:      Appearance: Normal appearance. She is not ill-appearing.  Neurological:     Mental Status: She is alert.  Psychiatric:        Mood and Affect: Mood normal.        Behavior: Behavior normal.        Thought Content: Thought content normal.        Judgment: Judgment normal.       Lab Results  Component Value Date   TSH 2.21 01/31/2022       03/14/2023   12:07 PM 02/14/2023    2:53 PM 06/26/2022    9:24 AM 06/11/2022    9:05 AM 06/04/2022    1:01 PM  Depression screen PHQ 2/9  Decreased Interest 3 0 0 0 0  Down, Depressed, Hopeless 3 0 0 0 0  PHQ - 2 Score 6 0 0 0 0  Altered sleeping 3 1 0 0 0  Tired, decreased energy 3 1 0 3 1  Change in appetite 2 0 0 0 0  Feeling bad or failure about yourself  3 0 0 1 0  Trouble concentrating 3 0 0 1 1  Moving slowly or fidgety/restless 0 0 0 0 0  Suicidal thoughts 1 0 0 0 0  PHQ-9 Score 21 2 0 5 2  Difficult doing work/chores Very difficult Somewhat difficult Not difficult at all Somewhat difficult Not difficult at all       03/14/2023   12:07 PM 02/14/2023    2:53 PM 06/11/2022    9:06 AM 06/04/2022    1:02 PM  GAD 7 : Generalized Anxiety Score  Nervous, Anxious, on Edge 2 0 1 0  Control/stop worrying 1 0 1 0  Worry too much - different things 1 0 1 0  Trouble relaxing 1 0 0 0  Restless 0 0 0 0  Easily annoyed or irritable 1 0 0 0  Afraid - awful might happen 2 0 0 0  Total GAD 7 Score 8 0 3 0  Anxiety Difficulty Somewhat difficult  Not difficult at all Not difficult at all   Assessment & Plan:   Problem List Items Addressed This Visit     MDD (major depressive disorder), recurrent episode, moderate (HCC) - Primary    Recurrent issue, previously benefited from  sertraline 100mg  daily. Will start sertraline 50mg  daily for 1 wk then increase  to 100mg  daily.  Reviewed side effects to monitor for.  Reviewed possible increased suicidality upon SSRI commencement.  Declines counseling at this time  RTC 4-6 wks f/u visit MDD.  Check TSH if not responding as expected.       Relevant Medications   sertraline (ZOLOFT) 100 MG tablet     Meds ordered this encounter  Medications   sertraline (ZOLOFT) 100 MG tablet    Sig: Take 1 tablet (100 mg total) by mouth daily.    Dispense:  30 tablet    Refill:  6    No orders of the defined types were placed in this encounter.   Patient Instructions  Start sertraline 100mg  1/2 tablet for the first week then if tolerated well, increase to whole 100mg  tablet dose.  Let your daughter know you're starting this, to keep an eye on bad thoughts. Let us know right away if any worsening suicidality.  Return in 4-6 weeks for follow up visit.   Follow up plan: Return in about 6 weeks (around 04/25/2023), or if symptoms worsen or fail to improve, for follow up visit.  Eustaquio Boyden, MD

## 2023-03-14 NOTE — Patient Instructions (Addendum)
Start sertraline 100mg  1/2 tablet for the first week then if tolerated well, increase to whole 100mg  tablet dose.  Let your daughter know you're starting this, to keep an eye on bad thoughts. Let us know right away if any worsening suicidality.  Return in 4-6 weeks for follow up visit.

## 2023-03-14 NOTE — Assessment & Plan Note (Addendum)
Recurrent issue, previously benefited from sertraline 100mg  daily. Will start sertraline 50mg  daily for 1 wk then increase to 100mg  daily.  Reviewed side effects to monitor for.  Reviewed possible increased suicidality upon SSRI commencement.  Declines counseling at this time  RTC 4-6 wks f/u visit MDD.  Check TSH if not responding as expected.

## 2023-04-01 DIAGNOSIS — J4489 Other specified chronic obstructive pulmonary disease: Secondary | ICD-10-CM | POA: Diagnosis not present

## 2023-04-01 DIAGNOSIS — R059 Cough, unspecified: Secondary | ICD-10-CM | POA: Diagnosis not present

## 2023-04-02 ENCOUNTER — Other Ambulatory Visit: Payer: PPO | Admitting: Pharmacist

## 2023-04-02 NOTE — Progress Notes (Signed)
   04/02/2023 Name: Nancy Merritt MRN: [id number] DOB: Jun 21, 1940  Subjective  Chief Complaint  Patient presents with   Medication Access    Reason for visit: Nancy Merritt is a 82 y.o. year old female who presented for a telephone visit.   They were referred to the pharmacist by their PCP for assistance in managing medication access.   Care Team: Primary Care Provider: Eustaquio Boyden, MD  Reason for visit: ?  Nancy Merritt is a 82 y.o. female who presents today for a phone visit with the pharmacist due to medication access concerns regarding their Breo Ellipta and Spiriva and Eliquis. ?   Medication Access: ?  Prescription drug coverage: Payor: [employer] ADVANTAGE / Plan: [employer]ANT[employer] PPO / Product Type: *No Product type* / .   Reports that all medications are not affordable regardless of Medicare coverage gap.   Current Patient Assistance:  None  Patient lives in a household of 1 and is unsure of combined monthly income at this time. Income includes  Social Security Retirement and is less than 300% FPL. Reports likely right under 200% though >150%.   Assessment and Plan:   1. Medication Access Patient lives in a household of 1  with an annual estimated income >150% <200% FPL  and therefore is likely eligible for MAP.  Novartis 400% FPL; GSK 300% FPL (after $600 OOP spend); [employer] 200% PFL. Patient aware of GSK acceptance only after $600 OOP spending.  [employer] (BI Cares) and [employer] application filled out today, provider page placed in PCP inbox.  Patient forms: Patient would like to sign forms in person. Patient portion placed in front office.  Income documentation:  Electronic verification elected for [employer] . First 2 pages of Tax form 1040 required for [employer].  Patient is not eligible for copay cards due to government insurance. Forwarded to [facility] Med Assistance team for future re-enrollment/tracking.  Upon completion of signed forms,  will fax full application to program and upload in Media Tab. [employer] Fax: [phone] and [employer] [phone].  Future Appointments  Date Time Provider Department Center  04/25/2023 10:30 AM Eustaquio Boyden, MD [facility]  05/15/2023  1:45 PM [facility] None  05/15/2023  2:00 PM Jeralyn Ruths, MD [facility] None  06/24/2023  3:40 PM Pricilla Riffle, MD [facility]  06/27/2023 12:30 PM Sherrie George, MD [facility] None  07/01/2023  8:45 AM [facility] ANNUAL WELLNESS VISIT 1 [facility]  08/08/2023 11:00 AM [facility]  08/14/2023  3:30 PM Deirdre Evener, MD [facility] None  08/15/2023  2:30 PM Eustaquio Boyden, MD [facility]   Loree Fee, PharmD Clinical Pharmacist [facility] [phone]

## 2023-04-02 NOTE — Patient Instructions (Addendum)
[name],   It was a pleasure to speak with you today! As we discussed:?   I have started applications for medication assistance from the manufacturer of the following medications:  Entresto Spiriva inhaler.  We also discussed that the Ellipta inhaler has a program that will provide free inhalers only after you have spent ~$600 at the pharmacy in the year you are applying. This is a program that we can certainly discuss next year.   [employer] Cares Patient Assistance Program Medication: Spiriva Respimat Inhaler [address] [address]  [place], [place] [address]  Phone: [phone]  Fax: [phone]   Novartis Patient Assistance Foundation (NPAF) Medication: Entresto 24-26 mg  NPAF, [address], [place], [place] [address]  Phone: [phone]  Fax: [phone]  GSK Patient Assistance Program  Medication: Ellipta inhalers (Eligible for enrollment only after spending $600 at the pharmacy in the year of enrollment).  [id number] [place], [place] [address]  Phone: [phone]  Fax: [phone]   Please reach out should you have any questions or concerns. You may leave a message on my direct office line, [phone].   Thank you!   Future Appointments  Date Time Provider Department Center  04/25/2023 10:30 AM Eustaquio Boyden, MD [facility]  05/15/2023  1:45 PM [facility] None  05/15/2023  2:00 PM Jeralyn Ruths, MD [facility] None  06/24/2023  3:40 PM Pricilla Riffle, MD [facility]  06/27/2023 12:30 PM Sherrie George, MD [facility] None  07/01/2023  8:45 AM [facility] ANNUAL WELLNESS VISIT 1 [facility]  08/08/2023 11:00 AM [facility] LAB [facility]  08/14/2023  3:30 PM Deirdre Evener, MD [facility] None  08/15/2023  2:30 PM Eustaquio Boyden, MD [facility]    Loree Fee, PharmD Clinical Pharmacist [facility] [phone]

## 2023-04-07 DIAGNOSIS — J449 Chronic obstructive pulmonary disease, unspecified: Secondary | ICD-10-CM | POA: Diagnosis not present

## 2023-04-07 DIAGNOSIS — R059 Cough, unspecified: Secondary | ICD-10-CM | POA: Diagnosis not present

## 2023-04-09 ENCOUNTER — Encounter: Payer: Self-pay | Admitting: Pharmacist

## 2023-04-09 NOTE — Progress Notes (Signed)
Patient's MAP applications have been successfully submitted on 04/09/23 with fax receipt conformation for the following: Novartis: Entresto 24-26 mg tablet [employer] Cares: Spiriva Respimat

## 2023-04-18 ENCOUNTER — Other Ambulatory Visit: Payer: Self-pay | Admitting: Oncology

## 2023-04-21 ENCOUNTER — Telehealth: Payer: Self-pay | Admitting: Family Medicine

## 2023-04-21 NOTE — Telephone Encounter (Signed)
Patient would like to make an appointment in office to see the pharmacist, interested in doing so on Tuesday 12.10.24 when she comes to see Dr. Reece Agar.  Please call the patient and confirm

## 2023-04-21 NOTE — Telephone Encounter (Signed)
CBC with Differential/Platelet Order: [id number] Status: Edited Result - FINAL     Visible to patient: Yes (not seen)     Next appt: 04/25/2023 at 10:30 AM in Family Medicine Eustaquio Boyden, MD)     Dx: Essential thrombocytosis (HCC)   0 Result Notes          Component Ref Range & Units 1 mo ago 2 mo ago 5 mo ago 6 mo ago 7 mo ago 8 mo ago 10 mo ago  WBC 4.0 - 10.5 K/uL 6.7 8.5 8.3 6.7 8.3 9.1 10.6 High   RBC 3.87 - 5.11 MIL/uL 4.26 3.93 4.09 3.83 Low  R 4.25 4.42 R 4.38  Hemoglobin 12.0 - 15.0 g/dL 32.4 40.1 02.7 25.3 66.4 13.1 12.9  HCT 36.0 - 46.0 % 41.4 38.9 39.6 35.9 Low  39.9 40.1 39.0  MCV 80.0 - 100.0 fL 97.2 99.0 96.8 93.7 R 93.9 90.6 R 89.0  MCH 26.0 - 34.0 pg 31.2 32.1 32.0  30.4  29.5  MCHC 30.0 - 36.0 g/dL 40.3 47.4 25.9 56.3 87.5 32.8 33.1  RDW 11.5 - 15.5 % 15.9 High  15.2 16.3 High  19.5 High  18.1 High  19.2 High  15.8 High   Platelets 150 - 400 K/uL 1,102 High Panic  1,220 High Panic  CM 1,014 High Panic  CM 901.0 High  R 1,182 High Panic  CM 1022.0 Repeated and verified X2. High Panic  R 1,425 High Panic  CM  Comment: SPECIMEN CHECKED FOR CLOTS REPEATED TO VERIFY PLATELET COUNT CONFIRMED BY SMEAR PLATELETS APPEAR INCREASED YORK,K AT 1040 ON 10.15.24 BY ISLEY,B  nRBC 0.0 - 0.2 % 0.0 0.0 0.0  0.0  0.0  Neutrophils Relative % % 53 61 68 62.1 R 65 66.3 R 69  Neutro Abs 1.7 - 7.7 K/uL 3.6 5.3 5.7 4.2 R 5.4 6.1 R 7.5  Lymphocytes Relative % 34 27 23 28.5 R 26 23.4 R 18  Lymphs Abs 0.7 - 4.0 K/uL 2.3 2.3 1.9 1.9 2.1 2.1 1.9  Monocytes Relative % 7 6 5  5.4 R 5 5.7 R 7  Monocytes Absolute 0.1 - 1.0 K/uL 0.5 0.5 0.4 0.4 0.4 0.5 0.7  Eosinophils Relative % 5 4 3  3.4 R 3 4.1 R 3  Eosinophils Absolute 0.0 - 0.5 K/uL 0.3 0.3 0.3 0.2 R 0.3 0.4 R 0.3  Basophils Relative % 1 1 1  0.6 R 1 0.5 R 1  Basophils Absolute 0.0 - 0.1 K/uL 0.0 0.0 0.0 0.0 0.0 0.0 0.1  WBC Morphology MORPHOLOGY UNREMARKABLE        Smear Review PLATELETS APPEAR INCREASED         Comment: PLATELET COUNT CONFIRMED BY SMEAR LARGE PLTS NOTED ON SMEAR  Immature Granulocytes % 0 1 0  0  2  Abs Immature Granulocytes 0.00 - 0.07 K/uL 0.03 0.04 CM 0.02 CM  0.03 CM  0.17 High  CM  Comment: Performed at [facility], [address] [place], [place] [address]  Resulting Agency [facility] [place] [facility]         Specimen Collected: 03/04/23 10:18 Last Resulted: 03/04/23 11:11

## 2023-04-25 ENCOUNTER — Ambulatory Visit: Payer: PPO | Admitting: Family Medicine

## 2023-04-29 ENCOUNTER — Ambulatory Visit (INDEPENDENT_AMBULATORY_CARE_PROVIDER_SITE_OTHER): Payer: PPO | Admitting: Family Medicine

## 2023-04-29 ENCOUNTER — Encounter: Payer: Self-pay | Admitting: Family Medicine

## 2023-04-29 VITALS — BP 138/78 | HR 70 | Temp 97.7°F | Ht 59.0 in | Wt 159.4 lb

## 2023-04-29 DIAGNOSIS — E210 Primary hyperparathyroidism: Secondary | ICD-10-CM | POA: Diagnosis not present

## 2023-04-29 DIAGNOSIS — F331 Major depressive disorder, recurrent, moderate: Secondary | ICD-10-CM

## 2023-04-29 DIAGNOSIS — J411 Mucopurulent chronic bronchitis: Secondary | ICD-10-CM | POA: Diagnosis not present

## 2023-04-29 MED ORDER — PREDNISONE 20 MG PO TABS: 40.0000 mg | ORAL_TABLET | Freq: Every day | ORAL | 0 refills | Status: AC

## 2023-04-29 NOTE — Patient Instructions (Addendum)
Retry sertraline 100mg  dose. If again trouble tolerating, let me know and I will send in 50mg  tablets - we may try 75mg  at that time.  Good to see you today.  Return as needed or at scheduled appt in March with me   Prednisone (steroid) burst prescription printed out today - may fill if you develop worsening shortness of breath or wheezing or more cough with sputum production. Let us know if fever or colored sputum develops. Let us know if you take steroid course.

## 2023-04-29 NOTE — Progress Notes (Unsigned)
Ph: [phone] Fax: [phone]   [name], female    DOB: [birth year], 82 y.o.   MRN: [id number]  This visit was conducted in person.  BP 138/78   Pulse 70   Temp 97.7 F (36.5 C) (Oral)   Ht 4\' 11"  (1.499 m)   Wt 159 lb 6 oz (72.3 kg)   SpO2 95%   BMI 32.19 kg/m    CC: 6 wk mood f/u visit  Subjective:   HPI: Nancy Merritt is a 82 y.o. female presenting on 04/29/2023 for Medical Management of Chronic Issues (Here for 6 wk mood f/u. )   See prior note for details.  H/o recurrent depression, recently worsened. Counseling previously not helpful. Component of seasonal affective disorder. Sertraline 100mg  started last visit. She didn't tolerate 100mg  dose so has continued 50mg  dose.   Working on getting set up PAP for [employer] - planning to see our pharmacist later this year.   Occ passive suicidality, no active plan.  H/o SA when she was 77s living in [place] - none since.  H/o behavioral health hospitalization.   Lab Results  Component Value Date   TSH 2.21 01/31/2022    Borderline hyperparathyroidism - latest PTH level normal. No h/o kidney stones. No recent DEXA - she previously declined. No abd pain.   COPD with worsened breathing - [facility] came out to her house 3 wks ago treated with nebulizer and mucinex. Symptoms are improving. No fever, but notes increased cough and sputum production and dyspnea/wheeze. She continues breo and spiriva regularly. H/o intolerances to inhaled corticosteroids but has tolerated prednisone well in the past.      Relevant past medical, surgical, family and social history reviewed and updated as indicated. Interim medical history since our last visit reviewed. Allergies and medications reviewed and updated. Outpatient Medications Prior to Visit  Medication Sig Dispense Refill   acetaminophen (TYLENOL) 500 MG tablet Take 1 tablet (500 mg total) by mouth 3 (three) times daily as needed for moderate  pain.     albuterol (PROVENTIL) (2.5 MG/3ML) 0.083% nebulizer solution Take 3 mLs (2.5 mg total) by nebulization every 6 (six) hours as needed for shortness of breath. 150 mL 1   albuterol (VENTOLIN HFA) 108 (90 Base) MCG/ACT inhaler Inhale 2 puffs into the lungs every 4 (four) hours as needed for wheezing or shortness of breath. 6.7 g 3   aspirin 81 MG chewable tablet Chew 1 tablet (81 mg total) by mouth daily.     carvedilol (COREG) 6.25 MG tablet Take 1 tablet (6.25 mg total) by mouth 2 (two) times daily with a meal. 60 tablet 11   Cholecalciferol (VITAMIN D3) 25 MCG (1000 UT) capsule Take 2 capsules (2,000 Units total) by mouth daily. 30 capsule    clopidogrel (PLAVIX) 75 MG tablet TAKE 1 TABLET BY MOUTH DAILY WITH BREAKFAST. 90 tablet 2   ezetimibe (ZETIA) 10 MG tablet Take 1 tablet (10 mg total) by mouth daily. 90 tablet 3   fluticasone (FLONASE) 50 MCG/ACT nasal spray Place 2 sprays into both nostrils daily. 16 g 6   fluticasone furoate-vilanterol (BREO ELLIPTA) 200-25 MCG/ACT AEPB Inhale 1 puff into the lungs daily. 180 each 1   hydroxyurea (HYDREA) 500 MG capsule TAKE 3 CAPSULES (1,500 MG TOTAL) BY MOUTH DAILY. MAY TAKE WITH FOOD TO MINIMIZE GI SIDE EFFECTS. 270 capsule 2   loratadine (CLARITIN) 10 MG tablet Take 10 mg by mouth daily.     Multiple Vitamin (  MULTIVITAMIN) tablet Take 1 tablet by mouth daily.     Multiple Vitamins-Minerals (PRESERVISION AREDS 2) CAPS Take 2 each by mouth daily at 12 noon.     nitroGLYCERIN (NITROSTAT) 0.4 MG SL tablet Place 1 tablet (0.4 mg total) under the tongue every 5 (five) minutes as needed for chest pain. 25 tablet 3   rosuvastatin (CRESTOR) 20 MG tablet Take 1 tablet (20 mg total) by mouth at bedtime. 90 tablet 3   sacubitril-valsartan (ENTRESTO) 24-26 MG Take 1 tablet by mouth 2 (two) times daily. 60 tablet 11   sertraline (ZOLOFT) 100 MG tablet Take 1 tablet (100 mg total) by mouth daily. 30 tablet 6   Tiotropium Bromide Monohydrate (SPIRIVA  RESPIMAT) 2.5 MCG/ACT AERS Inhale 2 puffs into the lungs daily. 12 g 3   No facility-administered medications prior to visit.     Per HPI unless specifically indicated in ROS section below Review of Systems  Objective:  BP 138/78   Pulse 70   Temp 97.7 F (36.5 C) (Oral)   Ht 4\' 11"  (1.499 m)   Wt 159 lb 6 oz (72.3 kg)   SpO2 95%   BMI 32.19 kg/m   Wt Readings from Last 3 Encounters:  04/29/23 159 lb 6 oz (72.3 kg)  03/14/23 160 lb 8 oz (72.8 kg)  03/04/23 157 lb 12.8 oz (71.6 kg)      Physical Exam Vitals and nursing note reviewed.  Constitutional:      Appearance: Normal appearance. She is not ill-appearing.  HENT:     Mouth/Throat:     Mouth: Mucous membranes are moist.     Pharynx: Oropharynx is clear. No oropharyngeal exudate or posterior oropharyngeal erythema.  Eyes:     Extraocular Movements: Extraocular movements intact.     Pupils: Pupils are equal, round, and reactive to light.  Neck:     Thyroid: No thyroid mass or thyromegaly.  Cardiovascular:     Rate and Rhythm: Normal rate and regular rhythm.     Pulses: Normal pulses.     Heart sounds: Normal heart sounds. No murmur heard. Pulmonary:     Effort: Pulmonary effort is normal. No respiratory distress.     Breath sounds: Wheezing (diffuse) present. No rhonchi or rales.     Comments: Coarse breath sounds Musculoskeletal:     Cervical back: Normal range of motion and neck supple.     Right lower leg: No edema.     Left lower leg: No edema.  Skin:    General: Skin is warm and dry.  Neurological:     Mental Status: She is alert.  Psychiatric:        Mood and Affect: Mood normal.        Behavior: Behavior normal.       Results for orders placed or performed in visit on 03/04/23  CBC with Differential/Platelet  Result Value Ref Range   WBC 6.7 4.0 - 10.5 K/uL   RBC 4.26 3.87 - 5.11 MIL/uL   Hemoglobin 13.3 12.0 - 15.0 g/dL   HCT 60.4 54.0 - 98.1 %   MCV 97.2 80.0 - 100.0 fL   MCH 31.2 26.0 -  34.0 pg   MCHC 32.1 30.0 - 36.0 g/dL   RDW 19.1 (H) 47.8 - 29.5 %   Platelets 1,102 (HH) 150 - 400 K/uL   nRBC 0.0 0.0 - 0.2 %   Neutrophils Relative % 53 %   Neutro Abs 3.6 1.7 - 7.7 K/uL   Lymphocytes Relative  34 %   Lymphs Abs 2.3 0.7 - 4.0 K/uL   Monocytes Relative 7 %   Monocytes Absolute 0.5 0.1 - 1.0 K/uL   Eosinophils Relative 5 %   Eosinophils Absolute 0.3 0.0 - 0.5 K/uL   Basophils Relative 1 %   Basophils Absolute 0.0 0.0 - 0.1 K/uL   WBC Morphology MORPHOLOGY UNREMARKABLE    Smear Review PLATELETS APPEAR INCREASED    Immature Granulocytes 0 %   Abs Immature Granulocytes 0.03 0.00 - 0.07 K/uL  Pathologist smear review  Result Value Ref Range   Path Review Blood smear is reviewed.       03/14/2023   12:07 PM 02/14/2023    2:53 PM 06/26/2022    9:24 AM 06/11/2022    9:05 AM 06/04/2022    1:01 PM  Depression screen PHQ 2/9  Decreased Interest 3 0 0 0 0  Down, Depressed, Hopeless 3 0 0 0 0  PHQ - 2 Score 6 0 0 0 0  Altered sleeping 3 1 0 0 0  Tired, decreased energy 3 1 0 3 1  Change in appetite 2 0 0 0 0  Feeling bad or failure about yourself  3 0 0 1 0  Trouble concentrating 3 0 0 1 1  Moving slowly or fidgety/restless 0 0 0 0 0  Suicidal thoughts 1 0 0 0 0  PHQ-9 Score 21 2 0 5 2  Difficult doing work/chores Very difficult Somewhat difficult Not difficult at all Somewhat difficult Not difficult at all       03/14/2023   12:07 PM 02/14/2023    2:53 PM 06/11/2022    9:06 AM 06/04/2022    1:02 PM  GAD 7 : Generalized Anxiety Score  Nervous, Anxious, on Edge 2 0 1 0  Control/stop worrying 1 0 1 0  Worry too much - different things 1 0 1 0  Trouble relaxing 1 0 0 0  Restless 0 0 0 0  Easily annoyed or irritable 1 0 0 0  Afraid - awful might happen 2 0 0 0  Total GAD 7 Score 8 0 3 0  Anxiety Difficulty Somewhat difficult  Not difficult at all Not difficult at all   Assessment & Plan:   Problem List Items Addressed This Visit   None    Meds ordered this  encounter  Medications   predniSONE (DELTASONE) 20 MG tablet    Sig: Take 2 tablets (40 mg total) by mouth daily for 5 days.    Dispense:  10 tablet    Refill:  0    No orders of the defined types were placed in this encounter.   Patient Instructions  Retry sertraline 100mg  dose. If again trouble tolerating, let me know and I will send in 50mg  tablets - we may try 75mg  at that time.  Good to see you today.  Return as needed or at scheduled appt in March with me   Prednisone (steroid) burst prescription printed out today - may fill if you develop worsening shortness of breath or wheezing or more cough with sputum production. Let us know if fever or colored sputum develops. Let us know if you take steroid course.   Follow up plan: Return if symptoms worsen or fail to improve.  Eustaquio Boyden, MD

## 2023-04-30 ENCOUNTER — Other Ambulatory Visit: Payer: Self-pay | Admitting: Family Medicine

## 2023-04-30 ENCOUNTER — Encounter: Payer: Self-pay | Admitting: Family Medicine

## 2023-04-30 DIAGNOSIS — J4530 Mild persistent asthma, uncomplicated: Secondary | ICD-10-CM

## 2023-04-30 NOTE — Assessment & Plan Note (Signed)
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 She has been using albuterol more regularly both inhaler and nebulizer, and recently had [facility] provider come out for home visit due to breathing. Given coarse breath sounds and wheezing noted on exam today, and endorsed increased cough, sputum production, dyspnea/wheeze, provided with printed prescription for prednisone burst with indications when to fill.  No fever, nonpurulent sputum no need for antibiotics at this time.

## 2023-04-30 NOTE — Assessment & Plan Note (Addendum)
History of this but I do not see where she ever saw an endocrinologist. Denies history of kidney stones, abdominal pain. Consider baseline DEXA scan-she has previously declined. Will repeat levels with next labs.

## 2023-04-30 NOTE — Assessment & Plan Note (Signed)
Last visit we started sertraline 50 mg with quick titration to 100 mg daily however she is on the 100 mg dose was too strong so has continued 50 mg half tablets daily.  Discussed reasonable to retrial 100 mg after she has been on the lower dose over a month and if not tolerated to stay on 50. Significant improvement as evidenced by PHQ-9 score decreasing from 21-13. Consider sertraline 75 mg dose if needed. She has declined counseling.

## 2023-05-05 ENCOUNTER — Ambulatory Visit: Payer: PPO | Admitting: Oncology

## 2023-05-05 ENCOUNTER — Other Ambulatory Visit: Payer: PPO

## 2023-05-07 ENCOUNTER — Ambulatory Visit (INDEPENDENT_AMBULATORY_CARE_PROVIDER_SITE_OTHER): Payer: PPO | Admitting: Pharmacist

## 2023-05-07 ENCOUNTER — Telehealth: Payer: Self-pay

## 2023-05-07 DIAGNOSIS — J449 Chronic obstructive pulmonary disease, unspecified: Secondary | ICD-10-CM

## 2023-05-07 DIAGNOSIS — I5042 Chronic combined systolic (congestive) and diastolic (congestive) heart failure: Secondary | ICD-10-CM

## 2023-05-07 NOTE — Progress Notes (Signed)
    05/07/2023 Name: Nancy Merritt MRN: [id number] DOB: 07-28-1940  Subjective  No chief complaint on file.  Care Team: Primary Care Provider: Eustaquio Boyden, MD  Reason for visit: Nancy Merritt is a 82 y.o. year old female who was referred for medication management by their primary care provider, Eustaquio Boyden, MD. They presented for a face to face visit today.   They were referred to the pharmacist by their PCP for assistance in managing medication access. Patient requested follow up in person to review her current enrollments given difficulties hearing over the phone.  Today she brought in a letter stating approval into [employer] program dated November though has not received a shipment of Entresto at this time.  Called [employer]: They sent a text message to patient prior. Were waiting on confirmation of address. Order was completed during our visit today with reported delivery date of Monday, 05/12/23. X90 day supply of Entresto 24/26 mg tablets.  Patient currently has ~1 month supply remaining at home  Enrolled in [employer] MAP through 05/19/24 ([employer] ID: [id number])   Patient has not heard form [employer] at this time. No record of communication to clinic via fax.  Called program today and per automated system, reports that her application has been successfully received though it "can take up to several weeks for processing" due to high volumes during peak season.  Luckily, patient has several months of inhalers at home and thus is okay waiting for their decision through end of the year and beyond if needed.  Advised patient to leave me a voicemail once she gets her letter in the mail and we will ensure her shipment is sent out in a more timely manner.  [employer] application received, confirmed by program  Patient provided with pharmacist direct line for further questions/concerns or any updates on her applications.   Loree Fee, PharmD Clinical Pharmacist [facility] [phone]

## 2023-05-07 NOTE — Patient Instructions (Signed)
[name],   It was a pleasure to speak with you today! As we discussed:?   Below is the general contact information for the programs supplying your medications.   [employer] Patient Assistance Program Medication: Spiriva Respimat Inhaler [id number]  [place], [place] [address]  Phone: [phone]  Fax: [phone]       [employer] Patient [employer] ([employer]) Medication: [name], [address], [place], [place] [address]  Phone: [phone]  Fax: [phone]   Please reach out prior to your next scheduled appointment should you have any questions or concerns. Phone: [phone]  Thank you!   Future Appointments  Date Time Provider Department Center  05/15/2023  1:45 PM [facility] None  05/15/2023  2:00 PM Jeralyn Ruths, MD [facility] None  06/24/2023  3:40 PM Pricilla Riffle, MD [facility]  06/27/2023 12:30 PM Sherrie George, MD [facility] None  07/01/2023  8:50 AM [facility] ANNUAL WELLNESS VISIT 1 [facility]  08/08/2023 11:00 AM [facility]  08/14/2023  3:30 PM Deirdre Evener, MD [facility] None  08/15/2023  2:30 PM Eustaquio Boyden, MD [facility]    Loree Fee, PharmD Clinical Pharmacist [facility] [phone]

## 2023-05-11 ENCOUNTER — Encounter: Payer: Self-pay | Admitting: Pharmacist

## 2023-05-11 NOTE — Progress Notes (Signed)
Pharmacy Quality Measure Review  This patient is appearing on a report for being at risk of failing the adherence measure for cholesterol (statin) medications this calendar year.   Medication: rosuvastatin 20 mg Last fill date: 10/8 for 90 day supply  Insurance report was not up to date. No action needed at this time.   Jarrett Ables, PharmD PGY-1 Pharmacy Resident

## 2023-05-15 ENCOUNTER — Inpatient Hospital Stay: Payer: PPO | Admitting: Oncology

## 2023-05-15 ENCOUNTER — Encounter: Payer: Self-pay | Admitting: Oncology

## 2023-05-15 ENCOUNTER — Inpatient Hospital Stay: Payer: PPO | Attending: Oncology

## 2023-05-26 ENCOUNTER — Encounter (HOSPITAL_COMMUNITY): Payer: Self-pay

## 2023-05-26 ENCOUNTER — Ambulatory Visit
Admission: RE | Admit: 2023-05-26 | Discharge: 2023-05-26 | Disposition: A | Discharge status: Home / Self Care | Payer: PPO | Source: Ambulatory Visit | Attending: Family Medicine | Admitting: Family Medicine

## 2023-05-26 ENCOUNTER — Ambulatory Visit (INDEPENDENT_AMBULATORY_CARE_PROVIDER_SITE_OTHER): Payer: PPO | Admitting: Radiology

## 2023-05-26 ENCOUNTER — Emergency Department (HOSPITAL_COMMUNITY)
Admission: EM | Admit: 2023-05-26 | Discharge: 2023-05-27 | Disposition: A | Discharge status: Home / Self Care | Payer: PPO | Attending: Emergency Medicine | Admitting: Emergency Medicine

## 2023-05-26 ENCOUNTER — Other Ambulatory Visit: Payer: Self-pay

## 2023-05-26 ENCOUNTER — Emergency Department (HOSPITAL_COMMUNITY): Payer: PPO

## 2023-05-26 VITALS — BP 130/74 | HR 63 | Temp 97.8°F | Resp 16 | Ht 59.0 in | Wt 160.0 lb

## 2023-05-26 DIAGNOSIS — Z7982 Long term (current) use of aspirin: Secondary | ICD-10-CM | POA: Insufficient documentation

## 2023-05-26 DIAGNOSIS — J441 Chronic obstructive pulmonary disease with (acute) exacerbation: Secondary | ICD-10-CM | POA: Insufficient documentation

## 2023-05-26 DIAGNOSIS — J189 Pneumonia, unspecified organism: Secondary | ICD-10-CM | POA: Diagnosis not present

## 2023-05-26 DIAGNOSIS — I700 Atherosclerosis of aorta: Secondary | ICD-10-CM | POA: Diagnosis not present

## 2023-05-26 DIAGNOSIS — Z20822 Contact with and (suspected) exposure to covid-19: Secondary | ICD-10-CM | POA: Diagnosis not present

## 2023-05-26 DIAGNOSIS — I110 Hypertensive heart disease with heart failure: Secondary | ICD-10-CM | POA: Insufficient documentation

## 2023-05-26 DIAGNOSIS — R531 Weakness: Secondary | ICD-10-CM | POA: Diagnosis not present

## 2023-05-26 DIAGNOSIS — R6889 Other general symptoms and signs: Secondary | ICD-10-CM | POA: Diagnosis not present

## 2023-05-26 DIAGNOSIS — R5383 Other fatigue: Secondary | ICD-10-CM | POA: Insufficient documentation

## 2023-05-26 DIAGNOSIS — R0602 Shortness of breath: Secondary | ICD-10-CM

## 2023-05-26 DIAGNOSIS — I509 Heart failure, unspecified: Secondary | ICD-10-CM | POA: Diagnosis not present

## 2023-05-26 DIAGNOSIS — Z79899 Other long term (current) drug therapy: Secondary | ICD-10-CM | POA: Insufficient documentation

## 2023-05-26 DIAGNOSIS — Z951 Presence of aortocoronary bypass graft: Secondary | ICD-10-CM | POA: Diagnosis not present

## 2023-05-26 DIAGNOSIS — I7121 Aneurysm of the ascending aorta, without rupture: Secondary | ICD-10-CM | POA: Diagnosis not present

## 2023-05-26 DIAGNOSIS — R918 Other nonspecific abnormal finding of lung field: Secondary | ICD-10-CM | POA: Diagnosis not present

## 2023-05-26 DIAGNOSIS — I2510 Atherosclerotic heart disease of native coronary artery without angina pectoris: Secondary | ICD-10-CM | POA: Insufficient documentation

## 2023-05-26 DIAGNOSIS — Z7901 Long term (current) use of anticoagulants: Secondary | ICD-10-CM | POA: Diagnosis not present

## 2023-05-26 LAB — CBC WITH DIFFERENTIAL/PLATELET
Abs Immature Granulocytes: 0.02 10*3/uL (ref 0.00–0.07)
Basophils Absolute: 0 10*3/uL (ref 0.0–0.1)
Basophils Relative: 0 %
Eosinophils Absolute: 0.2 10*3/uL (ref 0.0–0.5)
Eosinophils Relative: 2 %
HCT: 39.7 % (ref 36.0–46.0)
Hemoglobin: 13.2 g/dL (ref 12.0–15.0)
Immature Granulocytes: 0 %
Lymphocytes Relative: 23 %
Lymphs Abs: 1.9 10*3/uL (ref 0.7–4.0)
MCH: 33.3 pg (ref 26.0–34.0)
MCHC: 33.2 g/dL (ref 30.0–36.0)
MCV: 100.3 fL — ABNORMAL HIGH (ref 80.0–100.0)
Monocytes Absolute: 0.4 10*3/uL (ref 0.1–1.0)
Monocytes Relative: 5 %
Neutro Abs: 5.6 10*3/uL (ref 1.7–7.7)
Neutrophils Relative %: 70 %
Platelets: 691 10*3/uL — ABNORMAL HIGH (ref 150–400)
RBC: 3.96 MIL/uL (ref 3.87–5.11)
RDW: 16.7 % — ABNORMAL HIGH (ref 11.5–15.5)
WBC: 8.1 10*3/uL (ref 4.0–10.5)
nRBC: 0 % (ref 0.0–0.2)

## 2023-05-26 LAB — COMPREHENSIVE METABOLIC PANEL
ALT: 13 U/L (ref 0–44)
AST: 13 U/L — ABNORMAL LOW (ref 15–41)
Albumin: 3.3 g/dL — ABNORMAL LOW (ref 3.5–5.0)
Alkaline Phosphatase: 73 U/L (ref 38–126)
Anion gap: 6 (ref 5–15)
BUN: 13 mg/dL (ref 8–23)
CO2: 23 mmol/L (ref 22–32)
Calcium: 10.2 mg/dL (ref 8.9–10.3)
Chloride: 111 mmol/L (ref 98–111)
Creatinine, Ser: 0.75 mg/dL (ref 0.44–1.00)
GFR, Estimated: >60 mL/min (ref >60)
Glucose, Bld: 99 mg/dL (ref 70–99)
Potassium: 4.2 mmol/L (ref 3.5–5.1)
Sodium: 140 mmol/L (ref 135–145)
Total Bilirubin: 0.7 mg/dL (ref 0.0–1.2)
Total Protein: 6.5 g/dL (ref 6.5–8.1)

## 2023-05-26 LAB — POCT URINALYSIS DIP (MANUAL ENTRY)
Bilirubin, UA: NEGATIVE
Blood, UA: NEGATIVE
Glucose, UA: NEGATIVE mg/dL
Ketones, POC UA: NEGATIVE mg/dL
Leukocytes, UA: NEGATIVE
Nitrite, UA: NEGATIVE
Spec Grav, UA: >=1.03 — AB (ref 1.010–1.025)
Urobilinogen, UA: 0.2 U/dL
pH, UA: 5.5 (ref 5.0–8.0)

## 2023-05-26 LAB — URINALYSIS, ROUTINE W REFLEX MICROSCOPIC
Bilirubin Urine: NEGATIVE
Glucose, UA: NEGATIVE mg/dL
Hgb urine dipstick: NEGATIVE
Ketones, ur: NEGATIVE mg/dL
Leukocytes,Ua: NEGATIVE
Nitrite: NEGATIVE
Protein, ur: NEGATIVE mg/dL
Specific Gravity, Urine: 1.023 (ref 1.005–1.030)
pH: 5 (ref 5.0–8.0)

## 2023-05-26 MED ORDER — IOHEXOL 350 MG/ML SOLN
60.0000 mL | Freq: Once | INTRAVENOUS | Status: AC | PRN
  Administered 2023-05-26: 60 mL via INTRAVENOUS

## 2023-05-26 MED ORDER — ALBUTEROL SULFATE (2.5 MG/3ML) 0.083% IN NEBU
2.5000 mg | INHALATION_SOLUTION | Freq: Once | RESPIRATORY_TRACT | Status: AC
  Administered 2023-05-26: 2.5 mg via RESPIRATORY_TRACT

## 2023-05-26 NOTE — ED Triage Notes (Addendum)
 Pt BIB daughter. Pt's daughter voices concerns as mother is sleeping all day and all night. She does not have any energy and has been so tired for about a week. She has a decreased appetite too and she is barely drinking any fluids. Pt does report SOB, hx of asthma and COPD, O2 stat is 91% on room air. Pt denies any urinary symptoms at this time. Pt states I am a little confused at times. I am having trouble concentrating. Denies any recent falls. Pt denies taking medications for symptoms.

## 2023-05-26 NOTE — ED Provider Notes (Signed)
 [facility] Provider Note   CSN: [id number] Arrival date & time: 05/26/23  1535     History  Chief Complaint  Patient presents with   Shortness of Breath   Fatigue    Nancy Merritt is a 83 y.o. female.   Shortness of Breath Associated symptoms: cough   Patient presents for fatigue.  Medical history includes COPD, HTN, CAD, prediabetes, depression, arthritis, CHF.  For the past week, she has had decreased activity, fatigue, decreased appetite, shortness of breath, and nonproductive cough.  Typically, she is able to go to the store on her own.  Over the past month, she has had worsening exercise intolerance.  She is now completely out of breath with even ambulating small distances in her home.  She has had increased use of nebulizer machine at home.  She was seen urgent care prior to arrival.  Wheezing was noted on lung auscultation at the time.  SpO2 was 91% at rest and 90% with ambulation.  Dipstick urinalysis did not show infection.  She received 1 DuoNeb but had persistent wheezing.  She was advised to come to the ED for further evaluation.  Currently, at rest, she denies any symptoms.     Home Medications Prior to Admission medications   Medication Sig Start Date End Date Taking? Authorizing Provider  azithromycin  (ZITHROMAX ) 250 MG tablet Take 1 tablet (250 mg total) by mouth daily for 4 days. Take 1 every day until finished, starting tomorrow. 05/27/23 05/31/23 Yes Melvenia Motto, MD  predniSONE  (DELTASONE ) 10 MG tablet Take 4 tablets (40 mg total) by mouth daily for 4 days. Starting tomorrow 05/27/23 05/31/23 Yes Melvenia Motto, MD  acetaminophen  (TYLENOL ) 500 MG tablet Take 1 tablet (500 mg total) by mouth 3 (three) times daily as needed for moderate pain. 06/11/22   Rilla Baller, MD  albuterol  (PROVENTIL ) (2.5 MG/3ML) 0.083% nebulizer solution TAKE 3 MLS (2.5 MG TOTAL) BY NEBULIZATION EVERY 6 (SIX) HOURS AS NEEDED FOR SHORTNESS OF BREATH.  04/30/23   Rilla Baller, MD  albuterol  (VENTOLIN  HFA) 108 (90 Base) MCG/ACT inhaler Inhale 2 puffs into the lungs every 4 (four) hours as needed for wheezing or shortness of breath. 06/11/22   Rilla Baller, MD  aspirin  81 MG chewable tablet Chew 1 tablet (81 mg total) by mouth daily. 06/07/22   Meng, Hao, PA  carvedilol  (COREG ) 6.25 MG tablet Take 1 tablet (6.25 mg total) by mouth 2 (two) times daily with a meal. 06/06/22   Janene Boer, PA  Cholecalciferol  (VITAMIN D3) 25 MCG (1000 UT) capsule Take 2 capsules (2,000 Units total) by mouth daily. 08/15/22   Rilla Baller, MD  clopidogrel  (PLAVIX ) 75 MG tablet TAKE 1 TABLET BY MOUTH DAILY WITH BREAKFAST. 11/27/22   Hobart Powell BRAVO, MD  ezetimibe  (ZETIA ) 10 MG tablet Take 1 tablet (10 mg total) by mouth daily. 06/07/22   Meng, Hao, PA  fluticasone  (FLONASE ) 50 MCG/ACT nasal spray Place 2 sprays into both nostrils daily. 08/13/22   Rilla Baller, MD  fluticasone  furoate-vilanterol (BREO ELLIPTA ) 200-25 MCG/ACT AEPB Inhale 1 puff into the lungs daily. 02/05/23   Rilla Baller, MD  hydroxyurea  (HYDREA ) 500 MG capsule TAKE 3 CAPSULES (1,500 MG TOTAL) BY MOUTH DAILY. MAY TAKE WITH FOOD TO MINIMIZE GI SIDE EFFECTS. 04/21/23   Jacobo Evalene PARAS, MD  loratadine  (CLARITIN ) 10 MG tablet Take 10 mg by mouth daily.    [provider]  Multiple Vitamin (MULTIVITAMIN) tablet Take 1 tablet by mouth daily.  [provider]  Multiple Vitamins-Minerals (PRESERVISION AREDS 2) CAPS Take 2 each by mouth daily at 12 noon.    [provider]  nitroGLYCERIN  (NITROSTAT ) 0.4 MG SL tablet Place 1 tablet (0.4 mg total) under the tongue every 5 (five) minutes as needed for chest pain. 06/06/22   Meng, Hao, PA  rosuvastatin  (CRESTOR ) 20 MG tablet Take 1 tablet (20 mg total) by mouth at bedtime. 06/06/22   Meng, Hao, PA  sacubitril -valsartan  (ENTRESTO ) 24-26 MG Take 1 tablet by mouth 2 (two) times daily. 06/06/22   Meng, Hao, PA  sertraline   (ZOLOFT ) 100 MG tablet Take 1 tablet (100 mg total) by mouth daily. 03/14/23   Rilla Baller, MD  Tiotropium Bromide  Monohydrate (SPIRIVA  RESPIMAT) 2.5 MCG/ACT AERS Inhale 2 puffs into the lungs daily. 02/05/23   Rilla Baller, MD      Allergies    Advair diskus [fluticasone -salmeterol], Levofloxacin, Qvar [beclomethasone], Singulair [montelukast], and Symbicort [budesonide -formoterol fumarate]    Review of Systems   Review of Systems  Constitutional:  Positive for activity change, appetite change and fatigue.  Respiratory:  Positive for cough and shortness of breath.   Gastrointestinal:  Positive for nausea.  All other systems reviewed and are negative.   Physical Exam Updated Vital Signs BP (!) 196/78   Pulse 86   Temp 98.1 F (36.7 C) (Oral)   Resp 18   Ht 4' 11 (1.499 m)   Wt 72.6 kg   SpO2 93%   BMI 32.33 kg/m  Physical Exam Vitals and nursing note reviewed.  Constitutional:      General: She is not in acute distress.    Appearance: She is well-developed. She is not ill-appearing, toxic-appearing or diaphoretic.  HENT:     Head: Normocephalic and atraumatic.     Mouth/Throat:     Mouth: Mucous membranes are moist.  Eyes:     Conjunctiva/sclera: Conjunctivae normal.  Cardiovascular:     Rate and Rhythm: Normal rate and regular rhythm.     Heart sounds: No murmur heard. Pulmonary:     Effort: Pulmonary effort is normal. No tachypnea, accessory muscle usage or respiratory distress.     Breath sounds: Wheezing present.  Chest:     Chest wall: No tenderness.  Abdominal:     Palpations: Abdomen is soft.     Tenderness: There is no abdominal tenderness.  Musculoskeletal:        General: No swelling. Normal range of motion.     Cervical back: Normal range of motion and neck supple.     Right lower leg: No edema.     Left lower leg: No edema.  Skin:    General: Skin is warm and dry.     Coloration: Skin is not cyanotic or pale.  Neurological:     General:  No focal deficit present.     Mental Status: She is alert and oriented to person, place, and time.  Psychiatric:        Mood and Affect: Mood normal.        Behavior: Behavior normal.     ED Results / Procedures / Treatments   Labs (all labs ordered are listed, but only abnormal results are displayed) Labs Reviewed  URINALYSIS, ROUTINE W REFLEX MICROSCOPIC - Abnormal; Notable for the following components:      Result Value   APPearance HAZY (*)    All other components within normal limits  COMPREHENSIVE METABOLIC PANEL - Abnormal; Notable for the following components:   Albumin  3.3 (*)    AST 13 (*)    All other components within normal limits  CBC WITH DIFFERENTIAL/PLATELET - Abnormal; Notable for the following components:   MCV 100.3 (*)    RDW 16.7 (*)    Platelets 691 (*)    All other components within normal limits  RESP PANEL BY RT-PCR (RSV, FLU A&B, COVID)  [id number]  BRAIN NATRIURETIC PEPTIDE  MAGNESIUM  TROPONIN I (HIGH SENSITIVITY)  TROPONIN I (HIGH SENSITIVITY)    EKG EKG Interpretation Date/Time:  Monday May 26 2023 16:37:57 EST Ventricular Rate:  68 PR Interval:    QRS Duration:  74 QT Interval:  362 QTC Calculation: 384 R Axis:   44  Text Interpretation: Sinus rhythm Septal infarct , age undetermined Confirmed by Melvenia Motto [phone]) on 05/27/2023 12:49:49 AM  Radiology CT Angio Chest PE W and/or Wo Contrast Result Date: 05/27/2023 CLINICAL DATA:  Fatigue decreased appetite EXAM: CT ANGIOGRAPHY CHEST WITH CONTRAST TECHNIQUE: Multidetector CT imaging of the chest was performed using the standard protocol during bolus administration of intravenous contrast. Multiplanar CT image reconstructions and MIPs were obtained to evaluate the vascular anatomy. RADIATION DOSE REDUCTION: This exam was performed according to the departmental dose-optimization program which includes automated exposure control, adjustment of the mA and/or kV according to patient size and/or use  of iterative reconstruction technique. CONTRAST:  60mL OMNIPAQUE  IOHEXOL  350 MG/ML SOLN COMPARISON:  Chest x-ray 05/26/2023 FINDINGS: Cardiovascular: Satisfactory opacification of the pulmonary arteries to the segmental level. No evidence of pulmonary embolism. Moderate aortic atherosclerosis. No dissection. 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 Coronary vascular calcification. No pericardial effusion Mediastinum/Nodes: Patent trachea. No thyroid  mass. Subcentimeter mediastinal lymph nodes. Esophagus within normal limits. Small hiatal hernia Lungs/Pleura: Diffuse bilateral bronchial wall thickening and patchy areas of mucous plugging. Widespread bilateral tree-in-bud density and scattered pulmonary nodules, measuring up to 6 mm in the left lower lobe on series 5, image 49. Upper Abdomen: No acute finding. Musculoskeletal: No acute or suspicious osseous abnormality. Review of the MIP images confirms the above findings. IMPRESSION: 1. Negative for acute pulmonary embolus or aortic dissection. 2. Diffuse bilateral bronchial wall thickening and patchy areas of mucous plugging. Widespread bilateral tree-in-bud density and scattered pulmonary nodules, measuring up to 6 mm in the left lower lobe. Findings are favored to be due to infectious or inflammatory, consider atypical infection. 3. Aortic atherosclerosis. 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 This recommendation follows 2010 ACCF/AHA/AATS/ACR/ASA/SCA/SCAI/SIR/STS/SVM Guidelines for the Diagnosis and Management of Patients with Thoracic Aortic Disease. Circulation. 2010; 121: Z733-z630. Aortic aneurysm NOS (ICD10-I71.9) Aortic Atherosclerosis (ICD10-I70.0). Electronically Signed   By: Luke Bun M.D.   On: 05/27/2023 00:11   DG Chest 2 View Result Date: 05/26/2023 CLINICAL DATA:  Weakness and shortness of breath. EXAM: CHEST - 2 VIEW COMPARISON:  06/04/2022  FINDINGS: Stable heart size after prior CABG. There is no evidence of pulmonary edema, consolidation, pneumothorax, nodule or pleural fluid. 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 The visualized skeletal structures are unremarkable. IMPRESSION: No acute findings. Prior CABG. Electronically Signed   By: Marcey Moan M.D.   On: 05/26/2023 15:32    Procedures Procedures    Medications Ordered in ED Medications  iohexol  (OMNIPAQUE ) 350 MG/ML injection 60 mL (60 mLs Intravenous Contrast Given 05/26/23 2352)  predniSONE  (DELTASONE ) tablet 60 mg (60 mg Oral Given 05/27/23 0039)  albuterol  (PROVENTIL ) (2.5 MG/3ML) 0.083% nebulizer solution 2.5  mg (2.5 mg Nebulization Given 05/27/23 0040)  carvedilol  (COREG ) tablet 6.25 mg (6.25 mg Oral Given 05/27/23 0110)  sacubitril -valsartan  (ENTRESTO ) 24-26 mg per tablet (1 tablet Oral Given 05/27/23 0110)  azithromycin  (ZITHROMAX ) 500 mg in sodium chloride  0.9 % 250 mL IVPB (500 mg Intravenous New Bag/Given 05/27/23 0222)    ED Course/ Medical Decision Making/ A&P                                 Medical Decision Making Amount and/or Complexity of Data Reviewed Labs: ordered. Radiology: ordered.  Risk Prescription drug management.   This patient presents to the ED for concern of fatigue, shortness of breath, this involves an extensive number of treatment options, and is a complaint that carries with it a high risk of complications and morbidity.  The differential diagnosis includes COPD exacerbation, CHF exacerbation, pneumonia, PE, deconditioning,   Co morbidities that complicate the patient evaluation  COPD, HTN, CAD, prediabetes, depression, arthritis, CHF   Additional history obtained:  Additional history obtained from patient's family member External records from outside source obtained and reviewed including EMR   Lab Tests:  I Ordered, and personally interpreted labs.  The pertinent results include: Normal hemoglobin, no leukocytosis,  normal kidney function, normal electrolytes, normal BNP, normal troponin   Imaging Studies ordered:  I ordered imaging studies including CTA chest I independently visualized and interpreted imaging which showed diffuse bilateral bronchial wall thickening and patchy areas of mucous plugging, concerning for atypical infection I agree with the radiologist interpretation   Cardiac Monitoring: / EKG:  The patient was maintained on a cardiac monitor.  I personally viewed and interpreted the cardiac monitored which showed an underlying rhythm of: Sinus rhythm  Problem List / ED Course / Critical interventions / Medication management  Patient presenting for fatigue, shortness of breath, exercise intolerance.  Seen urgent care prior to arrival.  She did get a DuoNeb there.  On arrival in the ED, she is well-appearing with normal vital signs.  Current SpO2 is 95% on room air.  At rest, her breathing is unlabored.  She does continue to have wheezing on lung auscultation.  Steroid and additional neb was ordered.  Patient's recent symptoms likely multifactorial.  Will check BNP and CTA.  She is not currently on a diuretic.  Last echocardiogram was in April.  She had preserved LVEF with grade 1 diastolic dysfunction at that time.  Severe aortic calcification was also noted without evidence of stenosis or insufficiency.  Lab work today is reassuring.  Troponin and BNP are normal.  On CTA of chest, patient does have findings suggestive of possible atypical infection.  Patient was started on azithromycin .  She was hypertensive in the setting of missed evening doses of her home blood pressure medications.  She received these medications while in the ED.  SBP improved to 150.  I had a shared decision-making discussion with patient and family member.  Patient prefers to go home.  On trial of ambulation, patient reported improved symptoms.  She was discharged in stable condition. I ordered medication including  prednisone  and albuterol  for COPD; Coreg  and Entresto  for home blood pressure medications; azithromycin  for empiric treatment of atypical pneumonia Reevaluation of the patient after these medicines showed that the patient improved I have reviewed the patients home medicines and have made adjustments as needed   Social Determinants of Health:  Has access to outpatient care  Final Clinical Impression(s) / ED Diagnoses Final diagnoses:  Fatigue, unspecified type  COPD exacerbation (HCC)  Atypical pneumonia  Exercise intolerance    Rx / DC Orders ED Discharge Orders          Ordered    azithromycin  (ZITHROMAX ) 250 MG tablet  Daily        05/27/23 0330    predniSONE  (DELTASONE ) 10 MG tablet  Daily        05/27/23 0330    Ambulatory referral to Cardiology       Comments: If you have not heard from the Cardiology office within the next 72 hours please call [phone].   05/27/23 9667              Melvenia Motto, MD 05/27/23 [phone]

## 2023-05-26 NOTE — Discharge Instructions (Signed)
 Go to the emergency department for further evaluation and treatment.

## 2023-05-26 NOTE — ED Notes (Signed)
 Marylene Land PA would like patient to ambulate around unit to assess O2 stat. Pt's oxygen saturation maintained at 93-94%. PA made aware.

## 2023-05-26 NOTE — ED Provider Triage Note (Signed)
 Emergency Medicine Provider Triage Evaluation Note  Nancy Merritt , a 83 y.o. female  was evaluated in triage.  Pt complains of increased fatigue, bouts of confusion, and decreased appetite. Presents with daughter who is a good historian.   Went to urgent care today for increased fatigue since thanksgiving and decreased appetite. Endorses episodes of confusion/decreased concentration. Has O2 at home but not using. Had 1 albuterol  treatment today.   Review of Systems  Positive: Intermittent headaches worse at night, chronic cough (from COPD), increased shortness of breath worsening since thanksgiving, nausea Negative: Fever, chest pain, vomiting  Physical Exam  BP (!) 149/57 (BP Location: Right Arm)   Pulse 62   Temp 97.7 F (36.5 C) (Oral)   Resp 14   Ht 4' 11 (1.499 m)   Wt 72.6 kg   SpO2 95%   BMI 32.33 kg/m  Gen:   Awake, no distress   Resp:  Normal effort  MSK:   Moves extremities without difficulty  Other:    Medical Decision Making  Medically screening exam initiated at 5:04 PM.  Appropriate orders placed.  Nancy Merritt was informed that the remainder of the evaluation will be completed by another provider, this initial triage assessment does not replace that evaluation, and the importance of remaining in the ED until their evaluation is complete.     Nancy Merritt, [place] 05/26/23 1714

## 2023-05-26 NOTE — ED Triage Notes (Signed)
 Pt sent by UC for fatigue, SOB, loss of appetite and confusion since Thanksgiving. Pt is A&Ox4 at this time. Pt is eupneic.

## 2023-05-26 NOTE — ED Provider Notes (Signed)
 [facility]    CSN: [id number] Arrival date & time: 05/26/23  1347      History   Chief Complaint Chief Complaint  Patient presents with   Fatigue    Extreme fatigue, sleeps all day and all night. Little appetite with nausea. Foggy mind. Potential UTI in the elderly. - Entered by patient    HPI Nancy Merritt is a 83 y.o. female.   The history is provided by the patient and a relative.  Fatigue described as sleeping more, lack of energy, onset x 1 week.  Admits decreased appetite decreased fluid intake.  Patient states she has not been feeling well since Thanksgiving.  Has COPD and asthma has been feeling more short of breath than normal, has a nonproductive cough.  Family members concerned patient may have a UTI based on her research.  Patient denies dysuria, frequency, urgency, hematuria.  Has been using her rescue inhaler and nebulizer machine more often than normal last used nebulizer yesterday. Denies fever, chest pain, vomiting, diarrhea  Past Medical History:  Diagnosis Date   Arthritis    Asthma    Cancer (HCC) 1990   COVID-19 06/14/2020   Depression    Hx of breast cancer 04/07/2018   1990. 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/05/2022   NSTEMI (non-ST elevated myocardial infarction) (HCC) 06/04/2022   Prediabetes 01/31/2022   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   Hearing loss of both ears  due to cerumen impaction 07/11/2020   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/18/2020   History of CVA (cerebrovascular accident) without residual deficits 05/18/2020   COPD (chronic obstructive pulmonary disease) (HCC) 08/19/2019   Advance directive discussed with patient 11/09/2018   Primary hyperparathyroidism (HCC) 04/13/2018   Mild persistent asthma 04/07/2018   Hypertension 04/07/2018   Hx of breast cancer 04/07/2018   CAD (coronary artery disease) 04/07/2018    Past Surgical History:  Procedure Laterality Date   BREAST SURGERY Left 1990   CORONARY ARTERY BYPASS GRAFT     JOINT REPLACEMENT Right    Hip   LEFT HEART CATH AND CORS/GRAFTS ANGIOGRAPHY N/A 06/05/2022   Procedure: LEFT HEART CATH AND CORS/GRAFTS ANGIOGRAPHY;  Surgeon: Darron Deatrice LABOR, MD;  Location: [facility];  Service: Cardiovascular;  Laterality: N/A;   OVARIAN CYST REMOVAL  1970   TUBAL LIGATION  1970    OB History   No obstetric history on file.      Home Medications    Prior to Admission medications   Medication Sig Start Date End Date Taking? Authorizing Provider  acetaminophen  (TYLENOL ) 500 MG tablet Take 1 tablet (500 mg total) by mouth 3 (three) times daily as needed for moderate pain. 06/11/22   Rilla Baller, MD  albuterol  (PROVENTIL ) (2.5 MG/3ML) 0.083% nebulizer solution TAKE 3 MLS (2.5 MG TOTAL) BY NEBULIZATION EVERY 6 (SIX) HOURS AS NEEDED FOR SHORTNESS OF BREATH. 04/30/23   Rilla Baller, MD  albuterol  (VENTOLIN  HFA) 108 (90 Base) MCG/ACT inhaler Inhale 2 puffs into the lungs every 4 (four) hours as needed for wheezing or shortness of breath. 06/11/22   Rilla Baller, MD  aspirin  81 MG chewable tablet Chew 1 tablet (81 mg total) by mouth daily. 06/07/22   Meng, Hao, PA  carvedilol  (COREG ) 6.25 MG tablet Take 1 tablet (6.25 mg total) by mouth 2 (two) times daily with a meal. 06/06/22   Janene Boer, PA  Cholecalciferol  (VITAMIN D3) 25 MCG  (1000 UT) capsule Take 2 capsules (2,000 Units total) by mouth daily. 08/15/22   Rilla Baller, MD  clopidogrel  (PLAVIX ) 75 MG tablet TAKE 1 TABLET BY MOUTH DAILY WITH BREAKFAST. 11/27/22   Hobart Powell BRAVO, MD  ezetimibe  (ZETIA ) 10 MG tablet Take 1 tablet (10 mg total) by mouth daily. 06/07/22   Meng, Hao, PA  fluticasone  (FLONASE ) 50 MCG/ACT nasal spray Place 2 sprays into both nostrils daily. 08/13/22   Rilla Baller, MD  fluticasone  furoate-vilanterol (BREO ELLIPTA ) 200-25 MCG/ACT AEPB Inhale 1 puff into the lungs daily. 02/05/23   Rilla Baller, MD  hydroxyurea  (HYDREA ) 500 MG capsule TAKE 3 CAPSULES (1,500 MG TOTAL) BY MOUTH DAILY. MAY TAKE WITH FOOD TO MINIMIZE GI SIDE EFFECTS. 04/21/23   Jacobo Evalene PARAS, MD  loratadine  (CLARITIN ) 10 MG tablet Take 10 mg by mouth daily.    [provider]  Multiple Vitamin (MULTIVITAMIN) tablet Take 1 tablet by mouth daily.    [provider]  Multiple Vitamins-Minerals (PRESERVISION AREDS 2) CAPS Take 2 each by mouth daily at 12 noon.    [provider]  nitroGLYCERIN  (NITROSTAT ) 0.4 MG SL tablet Place 1 tablet (0.4 mg total) under the tongue every 5 (five) minutes as needed for chest pain. 06/06/22   Meng, Hao, PA  rosuvastatin  (CRESTOR ) 20 MG tablet Take 1 tablet (20 mg total) by mouth at bedtime. 06/06/22   Meng, Hao, PA  sacubitril -valsartan  (ENTRESTO ) 24-26 MG Take 1 tablet by mouth 2 (two) times daily. 06/06/22   Meng, Hao, PA  sertraline  (ZOLOFT ) 100 MG tablet Take 1 tablet (100 mg total) by mouth daily. 03/14/23   Rilla Baller, MD  Tiotropium Bromide  Monohydrate (SPIRIVA  RESPIMAT) 2.5 MCG/ACT AERS Inhale 2 puffs into the lungs daily. 02/05/23   Rilla Baller, MD    Family History Family History  Problem Relation Age of Onset   Cancer Father        lung   Heart attack Father 38   Alcohol abuse Father    Arthritis Father    COPD Father    Diabetes Father    Hypertension Father    Breast cancer  Sister 45   Arthritis Sister    Cancer Sister    COPD Sister    Drug abuse Sister    Hypertension Sister    Miscarriages / Stillbirths Sister    Thyroid  disease Sister    Arthritis Mother    COPD Mother    Diabetes Mother    Hearing loss Mother    Hypertension Mother    Miscarriages / Stillbirths Mother    Stroke Mother    Alcohol abuse Brother    Arthritis Brother    Hypertension Brother    Arthritis Daughter    Asthma Daughter    Depression Daughter    Hypertension Daughter    Miscarriages / Stillbirths Daughter    Thyroid  disease Daughter    Arthritis Maternal Grandmother    Hypertension Maternal Grandmother    Stroke Maternal Grandmother  Thyroid  disease Maternal Grandmother    Alcohol abuse Maternal Grandfather    Arthritis Maternal Grandfather    Cancer Maternal Grandfather    Depression Maternal Grandfather    Hearing loss Maternal Grandfather    Hypertension Maternal Grandfather    Arthritis Sister    Thyroid  disease Sister    Thyroid  disease Daughter    Hypertension Daughter    Depression Daughter    COPD Daughter    Alcohol abuse Daughter    Arthritis Daughter    Arthritis Daughter    COPD Daughter    Depression Daughter    Hearing loss Daughter    Hypertension Daughter    Thyroid  disease Daughter     Social History Social History   Tobacco Use   Smoking status: Former    Current packs/day: 0.00    Average packs/day: 1 pack/day for 40.0 years (40.0 ttl pk-yrs)    Types: Cigarettes    Start date: 64    Quit date: 2002    Years since quitting: 23.0   Smokeless tobacco: Never  Vaping Use   Vaping status: Never Used  Substance Use Topics   Alcohol use: Not Currently   Drug use: Never     Allergies   Advair diskus [fluticasone -salmeterol], Levofloxacin, Qvar [beclomethasone], Singulair [montelukast], and Symbicort [budesonide -formoterol fumarate]   Review of Systems Review of Systems  HENT:  Positive for voice change (hoarse).  Negative for ear pain and rhinorrhea.   Respiratory:  Positive for cough, shortness of breath and wheezing.   Cardiovascular:  Negative for chest pain.  Gastrointestinal:  Negative for diarrhea, nausea and vomiting.  Genitourinary:  Negative for frequency and urgency.     Physical Exam Triage Vital Signs ED Triage Vitals  Encounter Vitals Group     BP 05/26/23 1356 130/74     Systolic BP Percentile --      Diastolic BP Percentile --      Pulse Rate 05/26/23 1356 63     Resp 05/26/23 1356 16     Temp 05/26/23 1356 97.8 F (36.6 C)     Temp Source 05/26/23 1356 Oral     SpO2 05/26/23 1356 91 %     Weight 05/26/23 1406 160 lb (72.6 kg)     Height 05/26/23 1406 4' 11 (1.499 m)     Head Circumference --      Peak Flow --      Pain Score 05/26/23 1405 0     Pain Loc --      Pain Education --      Exclude from Growth Chart --    No data found.  Updated Vital Signs BP 130/74 (BP Location: Right Arm)   Pulse 63   Temp 97.8 F (36.6 C) (Oral)   Resp 16   Ht 4' 11 (1.499 m)   Wt 160 lb (72.6 kg)   SpO2 91%   BMI 32.32 kg/m   Visual Acuity Right Eye Distance:   Left Eye Distance:   Bilateral Distance:    Right Eye Near:   Left Eye Near:    Bilateral Near:     Physical Exam Constitutional:      Appearance: She is not ill-appearing.  HENT:     Head: Normocephalic and atraumatic.     Right Ear: Ear canal normal.     Left Ear: Ear canal normal.     Mouth/Throat:     Pharynx: Oropharynx is clear.  Cardiovascular:     Rate and Rhythm: Normal  rate and regular rhythm.     Heart sounds: Normal heart sounds.  Pulmonary:     Breath sounds: Wheezing (Diffuse expiratory) present.     Comments: Wheezing noted without stethoscope, O2 saturation 90% after ambulating from bathroom Abdominal:     General: Bowel sounds are normal.     Palpations: Abdomen is soft.     Tenderness: There is no abdominal tenderness.  Musculoskeletal:     Right lower leg: No edema.     Left  lower leg: No edema.  Skin:    General: Skin is warm and dry.  Neurological:     Mental Status: She is alert.      UC Treatments / Results  Labs (all labs ordered are listed, but only abnormal results are displayed) Labs Reviewed  POCT URINALYSIS DIP (MANUAL ENTRY) - Abnormal; Notable for the following components:      Result Value   Color, UA straw (*)    Clarity, UA hazy (*)    Spec Grav, UA >=1.030 (*)    Protein Ur, POC trace (*)    All other components within normal limits    EKG   Radiology No results found.  Procedures Procedures (including critical care time)  Medications Ordered in UC Medications  albuterol  (PROVENTIL ) (2.5 MG/3ML) 0.083% nebulizer solution 2.5 mg (has no administration in time range)    Initial Impression / Assessment and Plan / UC Course  I have reviewed the triage vital signs and the nursing notes.  Pertinent labs & imaging results that were available during my care of the patient were reviewed by me and considered in my medical decision making (see chart for details).  Clinical Course as of 05/26/23 1444  Mon May 26, 2023  1444 DG Chest 2 View [AA]    Clinical Course User Index [AA] Leim Slough, [place]    83 year old female with fatigue worse over the last week, has history of asthma and COPD reports that shortness of breath and dyspnea on exertion, her oxygen  saturation is 91% at rest, has diffuse wheezing on exam.  Family was concerned about a UTI, point-of-care urinalysis is straw-colored and hazy with specific gravity of 1.030 trace protein otherwise normal, no glucose no ketones no nitrates or leukocytes.  Patient was given nebulizer treatment.  Chest x-ray obtained Patient examined after neb treatment breath sounds are decreased, wheezing has improved but not resolved.  Dust x-ray independently viewed by me no acute infiltrate, postoperative changes Patient does not have many.  Family member will drive her to [facility] ED Final  Clinical Impressions(s) / UC Diagnoses   Final diagnoses:  SOB (shortness of breath)   Discharge Instructions   None    ED Prescriptions   None    PDMP not reviewed this encounter.   Ji Feldner, [place] 05/26/23 1501

## 2023-05-27 DIAGNOSIS — I7121 Aneurysm of the ascending aorta, without rupture: Secondary | ICD-10-CM | POA: Diagnosis not present

## 2023-05-27 DIAGNOSIS — R918 Other nonspecific abnormal finding of lung field: Secondary | ICD-10-CM | POA: Diagnosis not present

## 2023-05-27 DIAGNOSIS — I700 Atherosclerosis of aorta: Secondary | ICD-10-CM | POA: Diagnosis not present

## 2023-05-27 LAB — TROPONIN I (HIGH SENSITIVITY)
Troponin I (High Sensitivity): 4 ng/L (ref <18)
Troponin I (High Sensitivity): 4 ng/L (ref <18)

## 2023-05-27 LAB — RESP PANEL BY RT-PCR (RSV, FLU A&B, COVID)  RVPGX2
Influenza A by PCR: NEGATIVE
Influenza B by PCR: NEGATIVE
Resp Syncytial Virus by PCR: NEGATIVE
SARS Coronavirus 2 by RT PCR: NEGATIVE

## 2023-05-27 LAB — MAGNESIUM: Magnesium: 1.9 mg/dL (ref 1.7–2.4)

## 2023-05-27 LAB — BRAIN NATRIURETIC PEPTIDE: B Natriuretic Peptide: 50.4 pg/mL (ref 0.0–100.0)

## 2023-05-27 MED ORDER — PREDNISONE 20 MG PO TABS
60.0000 mg | ORAL_TABLET | Freq: Once | ORAL | Status: AC
Start: 2023-05-27 — End: 2023-05-27
  Administered 2023-05-27: 60 mg via ORAL
  Filled 2023-05-27: qty 3

## 2023-05-27 MED ORDER — ALBUTEROL SULFATE (2.5 MG/3ML) 0.083% IN NEBU
2.5000 mg | INHALATION_SOLUTION | Freq: Once | RESPIRATORY_TRACT | Status: AC
  Administered 2023-05-27: 2.5 mg via RESPIRATORY_TRACT
  Filled 2023-05-27: qty 3

## 2023-05-27 MED ORDER — SACUBITRIL-VALSARTAN 24-26 MG PO TABS
1.0000 | ORAL_TABLET | Freq: Once | ORAL | Status: AC
  Administered 2023-05-27: 1 via ORAL
  Filled 2023-05-27: qty 1

## 2023-05-27 MED ORDER — AZITHROMYCIN 250 MG PO TABS: 250.0000 mg | ORAL_TABLET | Freq: Every day | ORAL | 0 refills | Status: AC

## 2023-05-27 MED ORDER — SODIUM CHLORIDE 0.9 % IV SOLN
500.0000 mg | Freq: Once | INTRAVENOUS | Status: AC
  Administered 2023-05-27: 500 mg via INTRAVENOUS
  Filled 2023-05-27: qty 5

## 2023-05-27 MED ORDER — CARVEDILOL 3.125 MG PO TABS
6.2500 mg | ORAL_TABLET | Freq: Once | ORAL | Status: AC
  Administered 2023-05-27: 6.25 mg via ORAL
  Filled 2023-05-27: qty 2

## 2023-05-27 MED ORDER — PREDNISONE 10 MG PO TABS: 40.0000 mg | ORAL_TABLET | Freq: Every day | ORAL | 0 refills | Status: AC

## 2023-05-27 NOTE — Discharge Instructions (Addendum)
 You received your first doses of steroid and antibiotic in the emergency department.  Continue these medications for the next 4 days, starting tomorrow.  Continue breathing treatments at home as needed.  Continue other home medications as prescribed.  Follow-up with your cardiologist.  Return to the emergency department for any new or worsening symptoms of concern.

## 2023-05-27 NOTE — ED Notes (Signed)
 Pt ambulated well with minimal assistance to the bathroom as she walks with a cane at home. O2 steady 93%, with the lowest dropping to 90% for a few seconds then back up to 93%, pt states while walking, she feels a lot better and is sitting at the edge of the bed comfortably.

## 2023-05-29 ENCOUNTER — Other Ambulatory Visit: Payer: Self-pay | Admitting: Oncology

## 2023-05-29 NOTE — Telephone Encounter (Signed)
 CBC with Differential Order: [id number]  Status: Final result     Next appt: 06/24/2023 at 03:40 PM in Cardiology Kandra Gull, MD)   Test Result Released: Yes (not seen)   0 Result Notes          Component Ref Range & Units (hover) 3 d ago 2 mo ago 4 mo ago 6 mo ago 7 mo ago 8 mo ago 9 mo ago  WBC 8.1 6.7 8.5 8.3 6.7 8.3 9.1  RBC 3.96 4.26 3.93 4.09 3.83 Low  R 4.25 4.42 R  Hemoglobin 13.2 13.3 12.6 13.1 12.4 12.9 13.1  HCT 39.7 41.4 38.9 39.6 35.9 Low  39.9 40.1  MCV 100.3 High  97.2 99.0 96.8 93.7 R 93.9 90.6 R  MCH 33.3 31.2 32.1 32.0  30.4   MCHC 33.2 32.1 32.4 33.1 34.6 32.3 32.8  RDW 16.7 High  15.9 High  15.2 16.3 High  19.5 High  18.1 High  19.2 High   Platelets 691 High  1,102 High Panic  CM 1,220 High Panic  CM 1,014 High Panic  CM 901.0 High  R 1,182 High Panic  CM 1022.0 Repeated and verified X2. High Panic  R  nRBC 0.0 0.0 0.0 0.0  0.0   Neutrophils Relative % 70 53 61 68 62.1 R 65 66.3 R  Neutro Abs 5.6 3.6 5.3 5.7 4.2 R 5.4 6.1 R  Lymphocytes Relative 23 34 27 23 28.5 R 26 23.4 R  Lymphs Abs 1.9 2.3 2.3 1.9 1.9 2.1 2.1  Monocytes Relative 5 7 6 5  5.4 R 5 5.7 R  Monocytes Absolute 0.4 0.5 0.5 0.4 0.4 0.4 0.5  Eosinophils Relative 2 5 4 3  3.4 R 3 4.1 R  Eosinophils Absolute 0.2 0.3 0.3 0.3 0.2 R 0.3 0.4 R  Basophils Relative 0 1 1 1  0.6 R 1 0.5 R  Basophils Absolute 0.0 0.0 0.0 0.0 0.0 0.0 0.0  Immature Granulocytes 0 0 1 0  0   Abs Immature Granulocytes 0.02 0.03 CM 0.04 CM 0.02 CM  0.03 CM   Comment: Performed at [facility], [address] [place], [place] [address]  WBC Morphology  MORPHOLOGY UNREMARKABLE       Smear Review  PLATELETS APPEAR INCREASED CM       Resulting Agency [facility] [place] [facility]        Specimen Collected: 05/26/23 17:30 Last Resulted: 05/26/23 17:40  Comprehensive metabolic panel Order: [id number]  Status: Final result     Next appt: 06/24/2023 at 03:40 PM in  Cardiology Kandra Gull, MD)   Test Result Released: Yes (not seen)   0 Result Notes           Component Ref Range & Units (hover) 3 d ago (05/26/23) 8 mo ago (09/18/22) 9 mo ago (08/13/22) 11 mo ago (06/06/22) 11 mo ago (06/05/22) 11 mo ago (06/04/22) 1 yr ago (02/05/22) 1 yr ago (02/05/22)  Sodium 140 138 R 141 R 139 140 141    Potassium 4.2 4.8 R 5.7 High  R 3.9 3.8 4.3  4.4 R  Chloride 111 106 R 107 R 105 109 110    CO2 23 26 R 29 R 27 24 24     Glucose, Bld 99 112 High  99 114 High  CM 96 CM 96 CM    Comment: Glucose reference range applies only to samples taken after fasting for at  least 8 hours.  BUN 13 18 R 15 R 20 16 22     Creatinine, Ser 0.75 0.79 R 0.80 R 0.93 0.77 0.87    Calcium  10.2 10.6 High  R 10.6 High  R 10.2 9.3 10.0 10.9 High  R   Total Protein 6.5  6.6 R       Albumin 3.3 Low   4.0 R       AST 13 Low   28 R       ALT 13  32 R       Alkaline Phosphatase 73  109 R       Total Bilirubin 0.7  0.5 R       GFR, Estimated >60   >60 CM >60 CM >60 CM    Comment: (NOTE) Calculated using the CKD-EPI Creatinine Equation (2021)  Anion gap 6   7 CM 7 CM 7 CM    Comment: Performed at [facility], [address], [place], [place] [address]  Resulting Agency [facility] [place] [facility] [place] [facility]        Specimen Collected: 05/26/23 17:30 Last Resulted: 05/26/23 18:05

## 2023-05-29 NOTE — Telephone Encounter (Signed)
 I have called patient and got her voice mail, I left message to call me back with how she is currently taking her Hydrea . I will await her return call  I spoke with pharmacist who said that the prescription for 3 went through yesterday and he is going to inactivate the prescription for 1 capsule daily as no one else had already done so

## 2023-06-04 ENCOUNTER — Telehealth: Payer: Self-pay

## 2023-06-04 NOTE — Progress Notes (Signed)
 Transition Care Management Unsuccessful Follow-up Telephone Call  Date of discharge and from where:  05/27/2023 [facility]  Attempts:  2nd Attempt  Reason for unsuccessful TCM follow-up call:  Unable to leave message voicemail does not pickup.  Irving Lubbers [facility]  [facility] Guide Direct Dial: [phone]  Fax: [phone] Website: [email or web address]

## 2023-06-04 NOTE — Progress Notes (Signed)
 Transition Care Management Unsuccessful Follow-up Telephone Call  Date of discharge and from where:  05/27/2023 [facility]  Attempts:  1st Attempt  Reason for unsuccessful TCM follow-up call:  No answer/busy  Lalania Haseman [facility]  [facility] Guide Direct Dial: [phone]  Fax: [phone] Website: [email or web address]

## 2023-06-11 ENCOUNTER — Other Ambulatory Visit: Payer: Self-pay | Admitting: Physician Assistant

## 2023-06-17 ENCOUNTER — Telehealth: Payer: Self-pay | Admitting: Physician Assistant

## 2023-06-17 ENCOUNTER — Other Ambulatory Visit: Payer: Self-pay | Admitting: Physician Assistant

## 2023-06-17 NOTE — Telephone Encounter (Signed)
This patient left a voicemail that she would like to make appointment. She was a now show 12/26.please advise scheduling.  She would like a call back at [phone]

## 2023-06-19 ENCOUNTER — Telehealth: Payer: Self-pay

## 2023-06-19 ENCOUNTER — Inpatient Hospital Stay: Payer: PPO | Admitting: Oncology

## 2023-06-19 ENCOUNTER — Encounter: Payer: Self-pay | Admitting: Oncology

## 2023-06-19 ENCOUNTER — Inpatient Hospital Stay: Payer: PPO | Attending: Oncology

## 2023-06-19 ENCOUNTER — Encounter: Payer: Self-pay | Admitting: Pharmacist

## 2023-06-19 VITALS — BP 147/66 | HR 62 | Temp 98.2°F | Resp 18 | Ht 59.0 in | Wt 160.0 lb

## 2023-06-19 DIAGNOSIS — D473 Essential (hemorrhagic) thrombocythemia: Secondary | ICD-10-CM | POA: Insufficient documentation

## 2023-06-19 DIAGNOSIS — Z8616 Personal history of COVID-19: Secondary | ICD-10-CM | POA: Diagnosis not present

## 2023-06-19 DIAGNOSIS — Z801 Family history of malignant neoplasm of trachea, bronchus and lung: Secondary | ICD-10-CM | POA: Diagnosis not present

## 2023-06-19 DIAGNOSIS — Z87891 Personal history of nicotine dependence: Secondary | ICD-10-CM | POA: Diagnosis not present

## 2023-06-19 DIAGNOSIS — Z803 Family history of malignant neoplasm of breast: Secondary | ICD-10-CM | POA: Diagnosis not present

## 2023-06-19 LAB — CBC WITH DIFFERENTIAL/PLATELET
Abs Immature Granulocytes: 0.01 10*3/uL (ref 0.00–0.07)
Basophils Absolute: 0 10*3/uL (ref 0.0–0.1)
Basophils Relative: 1 %
Eosinophils Absolute: 0.2 10*3/uL (ref 0.0–0.5)
Eosinophils Relative: 4 %
HCT: 38.7 % (ref 36.0–46.0)
Hemoglobin: 12.8 g/dL (ref 12.0–15.0)
Immature Granulocytes: 0 %
Lymphocytes Relative: 39 %
Lymphs Abs: 1.7 10*3/uL (ref 0.7–4.0)
MCH: 33.7 pg (ref 26.0–34.0)
MCHC: 33.1 g/dL (ref 30.0–36.0)
MCV: 101.8 fL — ABNORMAL HIGH (ref 80.0–100.0)
Monocytes Absolute: 0.2 10*3/uL (ref 0.1–1.0)
Monocytes Relative: 6 %
Neutro Abs: 2.2 10*3/uL (ref 1.7–7.7)
Neutrophils Relative %: 50 %
Platelets: 546 10*3/uL — ABNORMAL HIGH (ref 150–400)
RBC: 3.8 MIL/uL — ABNORMAL LOW (ref 3.87–5.11)
RDW: 16.7 % — ABNORMAL HIGH (ref 11.5–15.5)
WBC: 4.4 10*3/uL (ref 4.0–10.5)
nRBC: 0 % (ref 0.0–0.2)

## 2023-06-19 MED ORDER — HYDROXYUREA 500 MG PO CAPS: 1500.0000 mg | ORAL_CAPSULE | Freq: Every day | ORAL | 2 refills | Status: AC

## 2023-06-19 NOTE — Telephone Encounter (Signed)
PAP: Patient assistance application for Nancy Merritt has been approved by PAP Companies: [employer] from 04/11/2023 to 05/19/2024. Medication should be delivered to PAP Delivery: Home. For further shipping updates, please contact [employer] ([employer]) at [phone]. Patient ID is: NO ID   PLEASE ADVISED CALLED TO FOLLOW UP AND PT WAS APPROVED AND SHIPMENT WAS SENT OUT 04/2023

## 2023-06-19 NOTE — Telephone Encounter (Signed)
PAP: Patient assistance application for Sherryll Burger has been approved by PAP Companies: [employer] from 04/11/2023 to 05/19/2024. Medication should be delivered to PAP Delivery: Home. For further shipping updates, please contact [employer] at [phone]. Patient ID is: NO ID    PLEASE BE ADVISED CALLED TO FOLLOW UP ON APPLICATION STATUS AND PT WAS APPROVED LAST SHIPMENT WAS 04/2023

## 2023-06-19 NOTE — Progress Notes (Signed)
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  INTERVAL HISTORY: Patient returns to clinic today for repeat laboratory work and further evaluation.  She was recently seen in the emergency room with fatigue and shortness of breath, but now feels well and back to her baseline.  She is tolerating 1500 mg Hydrea daily without significant side effects.  She does not complain of any weakness or fatigue.  She has no neurologic complaints.  She denies fevers.  She has a good appetite and denies weight loss.  She has no chest pain, shortness of breath, cough, or hemoptysis.  She denies any nausea, vomiting, constipation, or diarrhea.  She has no urinary complaints.  Patient off as no further specific complaints today.  REVIEW OF SYSTEMS:   Review of Systems  Constitutional: Negative.  Negative for fever, malaise/fatigue and weight loss.  Respiratory: Negative.  Negative for cough, hemoptysis and shortness of breath.   Cardiovascular: Negative.  Negative for chest pain and leg swelling.  Gastrointestinal: Negative.  Negative for abdominal pain.  Genitourinary: Negative.  Negative for dysuria.  Musculoskeletal: Negative.  Negative for back pain and myalgias.  Skin: Negative.  Negative for rash.  Neurological: Negative.  Negative for dizziness, focal weakness, weakness and headaches.  Psychiatric/Behavioral: Negative.  The patient is not nervous/anxious.     As per HPI. Otherwise, a complete review of systems  is negative.  PAST MEDICAL HISTORY: Past Medical History:  Diagnosis Date   Arthritis    Asthma    Cancer (HCC) 1990   COVID-19 06/14/2020   Depression    Hx of breast cancer 04/07/2018   1990. No screening in last 25 years    PAST SURGICAL HISTORY: Past Surgical History:  Procedure Laterality Date   BREAST SURGERY Left 1990   CORONARY ARTERY BYPASS GRAFT     JOINT REPLACEMENT Right    Hip   LEFT HEART CATH AND CORS/GRAFTS ANGIOGRAPHY N/A 06/05/2022   Procedure: LEFT HEART CATH AND CORS/GRAFTS ANGIOGRAPHY;  Surgeon: Iran Ouch, MD;  Location: [facility] INVASIVE [facility];  Service: Cardiovascular;  Laterality: N/A;   OVARIAN CYST REMOVAL  1970   TUBAL LIGATION  1970    FAMILY HISTORY: Family History  Problem Relation Age of Onset   Cancer Father        lung   Heart attack Father 89   Alcohol abuse Father    Arthritis Father    COPD Father    Diabetes Father    Hypertension Father    Breast cancer Sister 84   Arthritis Sister    Cancer Sister    COPD Sister    Drug abuse Sister    Hypertension Sister    Miscarriages / [place] Sister    Thyroid disease Sister    Arthritis Mother    COPD Mother    Diabetes Mother    Hearing loss Mother    Hypertension Mother    Miscarriages / [place] Mother    Stroke Mother    Alcohol abuse Brother    Arthritis Brother    Hypertension Brother    Arthritis Daughter  Asthma Daughter    Depression Daughter    Hypertension Daughter    Miscarriages / [place] Daughter    Thyroid disease Daughter    Arthritis Maternal Grandmother    Hypertension Maternal Grandmother    Stroke Maternal Grandmother    Thyroid disease Maternal Grandmother    Alcohol abuse Maternal Grandfather    Arthritis Maternal Grandfather    Cancer Maternal Grandfather    Depression Maternal Grandfather    Hearing loss Maternal Grandfather    Hypertension Maternal Grandfather    Arthritis Sister    Thyroid disease Sister    Thyroid disease  Daughter    Hypertension Daughter    Depression Daughter    COPD Daughter    Alcohol abuse Daughter    Arthritis Daughter    Arthritis Daughter    COPD Daughter    Depression Daughter    Hearing loss Daughter    Hypertension Daughter    Thyroid disease Daughter     ADVANCED DIRECTIVES (Y/N):  N  HEALTH MAINTENANCE: Social History   Tobacco Use   Smoking status: Former    Current packs/day: 0.00    Average packs/day: 1 pack/day for 40.0 years (40.0 ttl pk-yrs)    Types: Cigarettes    Start date: 60    Quit date: 2002    Years since quitting: 23.0   Smokeless tobacco: Never  Vaping Use   Vaping status: Never Used  Substance Use Topics   Alcohol use: Not Currently   Drug use: Never     Colonoscopy:  PAP:  Bone density:  Lipid panel:  Allergies  Allergen Reactions   Advair Diskus [Fluticasone-Salmeterol] Shortness Of Breath    As of 09/01/2014. Pt can take Breo   Levofloxacin Other (See Comments)    Nausea, brain fog, depression, as of 12/12/14   Qvar [Beclomethasone] Other (See Comments)    Lungs closing-as of 05/03/2015   Singulair [Montelukast] Shortness Of Breath   Symbicort [Budesonide-Formoterol Fumarate] Other (See Comments)    Hard time breathing as of 10/31/2014.    Current Outpatient Medications  Medication Sig Dispense Refill   acetaminophen (TYLENOL) 500 MG tablet Take 1 tablet (500 mg total) by mouth 3 (three) times daily as needed for moderate pain.     albuterol (PROVENTIL) (2.5 MG/3ML) 0.083% nebulizer solution TAKE 3 MLS (2.5 MG TOTAL) BY NEBULIZATION EVERY 6 (SIX) HOURS AS NEEDED FOR SHORTNESS OF BREATH. 150 mL 1   albuterol (VENTOLIN HFA) 108 (90 Base) MCG/ACT inhaler Inhale 2 puffs into the lungs every 4 (four) hours as needed for wheezing or shortness of breath. 6.7 g 3   aspirin 81 MG chewable tablet Chew 1 tablet (81 mg total) by mouth daily.     carvedilol (COREG) 6.25 MG tablet Take 1 tablet (6.25 mg total) by mouth 2 (two) times daily  with a meal. 60 tablet 11   Cholecalciferol (VITAMIN D3) 25 MCG (1000 UT) capsule Take 2 capsules (2,000 Units total) by mouth daily. 30 capsule    clopidogrel (PLAVIX) 75 MG tablet TAKE 1 TABLET BY MOUTH DAILY WITH BREAKFAST. 90 tablet 2   ENTRESTO 24-26 MG TAKE 1 TABLET BY MOUTH TWICE A DAY 60 tablet 11   ezetimibe (ZETIA) 10 MG tablet TAKE 1 TABLET BY MOUTH EVERY DAY 90 tablet 3   fluticasone (FLONASE) 50 MCG/ACT nasal spray Place 2 sprays into both nostrils daily. 16 g 6   fluticasone furoate-vilanterol (BREO ELLIPTA) 200-25 MCG/ACT AEPB Inhale 1 puff into the lungs daily. 180 each  1   loratadine (CLARITIN) 10 MG tablet Take 10 mg by mouth daily.     Multiple Vitamin (MULTIVITAMIN) tablet Take 1 tablet by mouth daily.     Multiple Vitamins-Minerals (PRESERVISION AREDS 2) CAPS Take 2 each by mouth daily at 12 noon.     nitroGLYCERIN (NITROSTAT) 0.4 MG SL tablet Place 1 tablet (0.4 mg total) under the tongue every 5 (five) minutes as needed for chest pain. 25 tablet 3   rosuvastatin (CRESTOR) 20 MG tablet Take 1 tablet (20 mg total) by mouth at bedtime. 90 tablet 3   sertraline (ZOLOFT) 100 MG tablet Take 1 tablet (100 mg total) by mouth daily. 30 tablet 6   Tiotropium Bromide Monohydrate (SPIRIVA RESPIMAT) 2.5 MCG/ACT AERS Inhale 2 puffs into the lungs daily. 12 g 3   hydroxyurea (HYDREA) 500 MG capsule Take 3 capsules (1,500 mg total) by mouth daily. May take with food to minimize GI side effects. 270 capsule 2   No current facility-administered medications for this visit.    OBJECTIVE: Vitals:   06/19/23 1432 06/19/23 1436  BP: (!) 147/66   Pulse: 62   Resp: 18 18  Temp: 98.2 F (36.8 C) 98.2 F (36.8 C)  SpO2: 96%      Body mass index is 32.32 kg/m.    ECOG FS:0 - Asymptomatic  General: Well-developed, well-nourished, no acute distress. Eyes: Pink conjunctiva, anicteric sclera. HEENT: Normocephalic, moist mucous membranes. Lungs: No audible wheezing or coughing. Heart:  Regular rate and rhythm. Abdomen: Soft, nontender, no obvious distention. Musculoskeletal: No edema, cyanosis, or clubbing. Neuro: Alert, answering all questions appropriately. Cranial nerves grossly intact. Skin: No rashes or petechiae noted. Psych: Normal affect.  LAB RESULTS:  Lab Results  Component Value Date   NA 140 05/26/2023   K 4.2 05/26/2023   CL 111 05/26/2023   CO2 23 05/26/2023   GLUCOSE 99 05/26/2023   BUN 13 05/26/2023   CREATININE 0.75 05/26/2023   CALCIUM 10.2 05/26/2023   PROT 6.5 05/26/2023   ALBUMIN 3.3 (L) 05/26/2023   AST 13 (L) 05/26/2023   ALT 13 05/26/2023   ALKPHOS 73 05/26/2023   BILITOT 0.7 05/26/2023   GFRNONAA >60 05/26/2023   GFRAA >60 01/07/2020    Lab Results  Component Value Date   WBC 4.4 06/19/2023   NEUTROABS 2.2 06/19/2023   HGB 12.8 06/19/2023   HCT 38.7 06/19/2023   MCV 101.8 (H) 06/19/2023   PLT 546 (H) 06/19/2023     STUDIES: CT Angio Chest PE W and/or Wo Contrast Result Date: 05/27/2023 CLINICAL DATA:  Fatigue decreased appetite EXAM: CT ANGIOGRAPHY CHEST WITH CONTRAST TECHNIQUE: Multidetector CT imaging of the chest was performed using the standard protocol during bolus administration of intravenous contrast. Multiplanar CT image reconstructions and MIPs were obtained to evaluate the vascular anatomy. RADIATION DOSE REDUCTION: This exam was performed according to the departmental dose-optimization program which includes automated exposure control, adjustment of the mA and/or kV according to patient size and/or use of iterative reconstruction technique. CONTRAST:  60mL OMNIPAQUE IOHEXOL 350 MG/ML SOLN COMPARISON:  Chest x-ray 05/26/2023 FINDINGS: Cardiovascular: Satisfactory opacification of the pulmonary arteries to the segmental level. No evidence of pulmonary embolism. Moderate aortic atherosclerosis. No dissection. 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 Coronary vascular calcification. No  pericardial effusion Mediastinum/Nodes: Patent trachea. No thyroid mass. Subcentimeter mediastinal lymph nodes. Esophagus within normal limits. Small hiatal hernia Lungs/Pleura: Diffuse bilateral bronchial wall thickening and patchy areas of mucous plugging.  Widespread bilateral tree-in-bud density and scattered pulmonary nodules, measuring up to 6 mm in the left lower lobe on series 5, image 49. Upper Abdomen: No acute finding. Musculoskeletal: No acute or suspicious osseous abnormality. Review of the MIP images confirms the above findings. IMPRESSION: 1. Negative for acute pulmonary embolus or aortic dissection. 2. Diffuse bilateral bronchial wall thickening and patchy areas of mucous plugging. Widespread bilateral tree-in-bud density and scattered pulmonary nodules, measuring up to 6 mm in the left lower lobe. Findings are favored to be due to infectious or inflammatory, consider atypical infection. 3. Aortic atherosclerosis. 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 This recommendation follows 2010 ACCF/AHA/AATS/ACR/ASA/SCA/SCAI/SIR/STS/SVM Guidelines for the Diagnosis and Management of Patients with Thoracic Aortic Disease. Circulation. 2010; 121: Z610-R604. Aortic aneurysm NOS (ICD10-I71.9) Aortic Atherosclerosis (ICD10-I70.0). Electronically Signed   By: Jasmine Pang M.D.   On: 05/27/2023 00:11   DG Chest 2 View Result Date: 05/26/2023 CLINICAL DATA:  Weakness and shortness of breath. EXAM: CHEST - 2 VIEW COMPARISON:  06/04/2022 FINDINGS: Stable heart size after prior CABG. There is no evidence of pulmonary edema, consolidation, pneumothorax, nodule or pleural fluid. 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 The visualized skeletal structures are unremarkable. IMPRESSION: No acute findings. Prior CABG. Electronically Signed   By: Irish Lack M.D.   On: 05/26/2023 15:32    ASSESSMENT: Essential thrombocytosis with CALR  mutation.  PLAN:    Essential thrombocytosis with CALR mutation: Patient's platelet count has significantly improved and is now 546.  Continue Hydrea 1500 mg daily.  No further interventions are needed.  Return to clinic in 6 weeks for laboratory work only and then in 3 months for laboratory work, further evaluation, and continuation of treatment.   Cardiac disease: Being medically managed.  Follow-up with her cardiologist as indicated.  I spent a total of 20 minutes reviewing chart data, face-to-face evaluation with the patient, counseling and coordination of care as detailed above.  Patient expressed understanding and was in agreement with this plan. She also understands that She can call clinic at any time with any questions, concerns, or complaints.    Jeralyn Ruths, MD   06/19/2023 3:01 PM

## 2023-06-19 NOTE — Progress Notes (Unsigned)
Patient Called and Left Message - Reports she has received all of her PAP medications successfully this year.   [employer]: [employer] Cares: Spiriva Respimat

## 2023-06-22 NOTE — Progress Notes (Signed)
 Cardiology Office Note:    Date:  06/25/2023   ID:  Nancy Merritt, DOB 08-Jan-1941, MRN [id number]  PCP:  Rilla Baller, MD   [facility]  Cardiologist:  Powell FORBES Sorrow, MD (Inactive)  Advanced Practice Provider:  No care team member to display Electrophysiologist:  None   Referring MD: Rilla Baller, MD    History of Present Illness:    Nancy Merritt is a 83 y.o. 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  Dec 2022  CVA   PT visiting family in [place] at the time  CTA head and neck showed no acute findings  (per report, pt in [place] at time)  Symptoms of speech problems resolved   PT started on ASA and Lipitor     Feb 2022:  30 day monitor showed no afib   Pt declined referral to EP  March 2022:   LVEF 60 to 65%  Normal RV function  + PFO     Jan 2024  PT admitted with CP and elevated trop,  LHC showed severe native vessel CAD with patent grafts including LIMA to LAD, SVG to diagonal, SVG to OM and SVG to right PDA. LVEDP was eleavted at . Symptoms ultimately thought to be demand in the setting of hypertension and volume overload and she was managed medically.   April 2024  TTE with EF 61%, normal GLS, normal RV systolic function, mild MR, severe aortic sclerosis without stenosis.   The pt was previously seen by VEAR Sorrow  The pt returns for follow up   She denies CP   Her breathing is improved since recovering from pneumonia.  She denies palpitations.    Past Medical History:  Diagnosis Date   Arthritis    Asthma    Cancer (HCC) 1990   COVID-19 06/14/2020   Depression    Hx of breast cancer 04/07/2018   1990. No screening in last 25 years    Past Surgical History:  Procedure Laterality Date   BREAST SURGERY Left 1990   CORONARY ARTERY BYPASS GRAFT     JOINT REPLACEMENT Right    Hip   LEFT HEART CATH AND  CORS/GRAFTS ANGIOGRAPHY N/A 06/05/2022   Procedure: LEFT HEART CATH AND CORS/GRAFTS ANGIOGRAPHY;  Surgeon: Darron Deatrice LABOR, MD;  Location: [facility];  Service: Cardiovascular;  Laterality: N/A;   OVARIAN CYST REMOVAL  1970   TUBAL LIGATION  1970    Current Medications: Current Meds  Medication Sig   acetaminophen  (TYLENOL ) 500 MG tablet Take 1 tablet (500 mg total) by mouth 3 (three) times daily as needed for moderate pain.   albuterol  (PROVENTIL ) (2.5 MG/3ML) 0.083% nebulizer solution TAKE 3 MLS (2.5 MG TOTAL) BY NEBULIZATION EVERY 6 (SIX) HOURS AS NEEDED FOR SHORTNESS OF BREATH.   albuterol  (VENTOLIN  HFA) 108 (90 Base) MCG/ACT inhaler Inhale 2 puffs into the lungs every 4 (four) hours as needed for wheezing or shortness of breath.   aspirin  81 MG chewable tablet Chew 1 tablet (81 mg total) by mouth daily.   carvedilol  (COREG ) 6.25 MG tablet Take 1 tablet (6.25 mg total) by mouth 2 (two) times daily with a meal.   Cholecalciferol  (VITAMIN D3) 25 MCG (1000 UT) capsule Take 2 capsules (2,000 Units total) by mouth daily.   clopidogrel  (PLAVIX ) 75 MG tablet TAKE 1 TABLET BY MOUTH DAILY  WITH BREAKFAST.   ezetimibe  (ZETIA ) 10 MG tablet TAKE 1 TABLET BY MOUTH EVERY DAY   fluticasone  (FLONASE ) 50 MCG/ACT nasal spray Place 2 sprays into both nostrils daily.   fluticasone  furoate-vilanterol (BREO ELLIPTA ) 200-25 MCG/ACT AEPB Inhale 1 puff into the lungs daily.   hydroxyurea  (HYDREA ) 500 MG capsule Take 3 capsules (1,500 mg total) by mouth daily. May take with food to minimize GI side effects.   loratadine  (CLARITIN ) 10 MG tablet Take 10 mg by mouth daily.   Multiple Vitamin (MULTIVITAMIN) tablet Take 1 tablet by mouth daily.   Multiple Vitamins-Minerals (PRESERVISION AREDS 2) CAPS Take 2 each by mouth daily at 12 noon.   nitroGLYCERIN  (NITROSTAT ) 0.4 MG SL tablet Place 1 tablet (0.4 mg total) under the tongue every 5 (five) minutes as needed for chest pain.   rosuvastatin  (CRESTOR ) 20 MG  tablet Take 1 tablet (20 mg total) by mouth at bedtime.   sertraline  (ZOLOFT ) 100 MG tablet Take 1 tablet (100 mg total) by mouth daily.   Tiotropium Bromide  Monohydrate (SPIRIVA  RESPIMAT) 2.5 MCG/ACT AERS Inhale 2 puffs into the lungs daily.   [DISCONTINUED] ENTRESTO  24-26 MG TAKE 1 TABLET BY MOUTH TWICE A DAY     Allergies:   Advair diskus [fluticasone -salmeterol], Levofloxacin, Qvar [beclomethasone], Singulair [montelukast], and Symbicort [budesonide -formoterol fumarate]   Social History   Socioeconomic History   Marital status: Widowed    Spouse name: Not on file   Number of children: 3   Years of education: 2 associate degrees   Highest education level: Not on file  Occupational History   Not on file  Tobacco Use   Smoking status: Former    Current packs/day: 0.00    Average packs/day: 1 pack/day for 40.0 years (40.0 ttl pk-yrs)    Types: Cigarettes    Start date: 42    Quit date: 2002    Years since quitting: 23.1   Smokeless tobacco: Never  Vaping Use   Vaping status: Never Used  Substance and Sexual Activity   Alcohol use: Not Currently   Drug use: Never   Sexual activity: Not Currently  Other Topics Concern   Not on file  Social History Narrative   Moved from [place]   Lives with Daughter, Josetta  (one daughter in [place]  and the other in [place] ) - 5 granddaughters and 2 great-grandchildren   Enjoys needle work, reading, cook   Exercise: not really, can walk 1/2 a mile at slow pace w/o getting SOB, SOB with stairs   Sleeps on the ground floor   Retired from audiological scientist (bookkeeper)   Social Drivers of Corporate Investment Banker Strain: Low Risk  (06/26/2022)   Overall Financial Resource Strain (CARDIA)    Difficulty of Paying Living Expenses: Not hard at all  Food Insecurity: No Food Insecurity (06/26/2022)   Hunger Vital Sign    Worried About Running Out of Food in the Last Year: Never true    Ran Out of Food in the Last Year: Never true   Transportation Needs: No Transportation Needs (06/26/2022)   PRAPARE - Administrator, Civil Service (Medical): No    Lack of Transportation (Non-Medical): No  Physical Activity: Inactive (06/26/2022)   Exercise Vital Sign    Days of Exercise per Week: 4 days    Minutes of Exercise per Session: 0 min  Stress: No Stress Concern Present (06/26/2022)   [employer] of Occupational Health - Occupational Stress Questionnaire    Feeling of Stress :  Not at all  Social Connections: Moderately Isolated (06/26/2022)   Social Connection and Isolation Panel [NHANES]    Frequency of Communication with Friends and Family: Twice a week    Frequency of Social Gatherings with Friends and Family: More than three times a week    Attends Religious Services: More than 4 times per year    Active Member of [employer] or Organizations: No    Attends Banker Meetings: Never    Marital Status: Widowed     Family History: The patient's family history includes Alcohol abuse in her brother, daughter, father, and maternal grandfather; Arthritis in her brother, daughter, daughter, daughter, father, maternal grandfather, maternal grandmother, mother, sister, and sister; Asthma in her daughter; Breast cancer (age of onset: 35) in her sister; COPD in her daughter, daughter, father, mother, and sister; Cancer in her father, maternal grandfather, and sister; Depression in her daughter, daughter, daughter, and maternal grandfather; Diabetes in her father and mother; Drug abuse in her sister; Hearing loss in her daughter, maternal grandfather, and mother; Heart attack (age of onset: 28) in her father; Hypertension in her brother, daughter, daughter, daughter, father, maternal grandfather, maternal grandmother, mother, and sister; Miscarriages / Stillbirths in her daughter, mother, and sister; Stroke in her maternal grandmother and mother; Thyroid  disease in her daughter, daughter, daughter, maternal grandmother,  sister, and sister.   EKGs/Labs/Other Studies Reviewed:    The following studies were reviewed today: TTE 09-18-2022: IMPRESSIONS     1. Left ventricular ejection fraction, by estimation, is 60 to 65%. Left  ventricular ejection fraction by 3D volume is 61 %. The left ventricle has  normal function. The left ventricle has no regional wall motion  abnormalities. There is mild left  ventricular hypertrophy of the basal-septal segment. Left ventricular  diastolic parameters are consistent with Grade I diastolic dysfunction  (impaired relaxation). The average left ventricular global longitudinal  strain is -21.2 %. The global  longitudinal strain is normal.   2. Right ventricular systolic function is normal. The right ventricular  size is mildly enlarged. Tricuspid regurgitation signal is inadequate for  assessing PA pressure.   3. The mitral valve is normal in structure. Mild mitral valve  regurgitation. No evidence of mitral stenosis.   4. The aortic valve is calcified. There is severe calcifcation of the  aortic valve. There is severe thickening of the aortic valve. Aortic valve  regurgitation is not visualized. Aortic valve sclerosis/calcification is  present, without any evidence of  aortic stenosis. Aortic valve area, by VTI measures 2.17 cm. Aortic valve  mean gradient measures 9.0 mmHg. Aortic valve Vmax measures 2.08 m/s.   5. Aortic dilatation noted. There is mild dilatation of the ascending  aorta, measuring 39 mm.   6. The inferior vena cava is normal in size with greater than 50%  respiratory variability, suggesting right atrial pressure of 3 mmHg.   Cath 06/05/2022   Prox Cx to Mid Cx lesion is 100% stenosed.   Mid LAD lesion is 100% stenosed.   Prox LAD to Mid LAD lesion is 70% stenosed.   1st Diag lesion is 100% stenosed.   Mid RCA lesion is 100% stenosed.   Origin to Prox Graft lesion is 30% stenosed.   RPAV lesion is 50% stenosed.   Prox RCA lesion is 80%  stenosed.   LIMA graft was visualized by non-selective angiography and is normal in caliber.   SVG graft was visualized by angiography and is normal in caliber.   SVG  graft was visualized by angiography.   The graft exhibits no disease.   The graft exhibits no disease.   The graft exhibits mild .   The left ventricular systolic function is normal.   LV end diastolic pressure is moderately elevated.   The left ventricular ejection fraction is 45-50% by visual estimate.   1.  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 2.  Mildly reduced LV systolic function with moderately elevated left ventricular end-diastolic pressure at 28 mmHg.  Could not exclude wall motion abnormalities.   Recommendations: No clear culprit is identified for small myocardial infarction which could be due to small vessel disease versus stress-induced cardiomyopathy.  Obtain an echocardiogram.  Recommend blood pressure control and consider gentle diuresis.  I added clopidogrel  to be used for 1 year and referred the patient to cardiac rehab. TTE 08/09/20:  1. Left ventricular ejection fraction, by estimation, is 60 to 65%. The  left ventricle has normal function. The left ventricle has no regional  wall motion abnormalities. Left ventricular diastolic parameters are  consistent with Grade I diastolic  dysfunction (impaired relaxation).   2. Right ventricular systolic function is normal. The right ventricular  size is normal. There is normal pulmonary artery systolic pressure. The  estimated right ventricular systolic pressure is 12.7 mmHg.   3. The mitral valve is grossly normal. Trivial mitral valve  regurgitation.   4. The aortic valve is tricuspid. Aortic valve regurgitation is not  visualized. Mild aortic valve sclerosis is present, with no evidence of  aortic valve stenosis.   5. The inferior vena cava is  normal in size with greater than 50%  respiratory variability, suggesting right atrial pressure of 3 mmHg.   6. Evidence of possible atrial level shunting suggestive of a PFO - the  atrial septum is aneurysmal. Images do not visualize the left atrium well,  but significant bubbles are seen within 3-4 beats in the left ventricle  suggesting a modest atrial level  shunt. TEE with bubble study recommended for confirmation.   30day monitor 08/23/20: Patient's monitoring period was from 07/21/2020-08/19/2020 Predominant rhythm was NSR with average HR 85bpm; rnaging from 52-132bpm There was no Afib Rare brief runs of NSVT, rare PVCs, rare SVE No significant pauses or sustained arrhythmias    EKG:   No new ECG    Recent Labs: 05/26/2023: ALT 13; B Natriuretic Peptide 50.4; BUN 13; Creatinine, Ser 0.75; Magnesium 1.9; Potassium 4.2; Sodium 140 06/19/2023: Hemoglobin 12.8; Platelets 546  Recent Lipid Panel    Component Value Date/Time   CHOL 94 08/13/2022 1338   CHOL 158 07/17/2020 1213   TRIG 109.0 08/13/2022 1338   HDL 38.90 (L) 08/13/2022 1338   HDL 53 07/17/2020 1213   CHOLHDL 2 08/13/2022 1338   VLDL 21.8 08/13/2022 1338   LDLCALC 33 08/13/2022 1338   LDLCALC 87 07/17/2020 1213      Physical Exam:    VS:  BP (!) 150/80   Pulse 62   Ht 5' (1.524 m)   Wt 157 lb 3.2 oz (71.3 kg)   SpO2 94%   BMI 30.70 kg/m     Wt Readings from Last 3 Encounters:  06/24/23 157 lb 3.2 oz (71.3 kg)  06/19/23 160 lb (72.6 kg)  05/26/23 160 lb 0.9 oz (72.6 kg)     GEN: Well nourished, well developed in no acute distress HEENT: Normal NECK:  No JVD; No carotid bruits CARDIAC: RRR, 2/6 systolic murmur RESPIRATORY:  CTA ABDOMEN: Soft, no masses  NO hepatomegaly   MUSCULOSKELETAL:  No LE edema;]  NEUROLOGIC:  Alert and oriented x 3 PSYCHIATRIC:  Normal affect   ASSESSMENT:     #CAD s/p 4v CABG: - Most recent cath 05/2022 with severe native vessel disease with patent grafts including  LIMA to LAD, SVG to diagonal, SVG to OM and SVG to right PDA  -TTE 08/2022 with LVEF 60-65%, no WMA -Continue ASA 81mg  daily, plavix  75mg  daily -Continue crestor  20mg  daily -Continue zetia  10mg  daily -Continue coreg  6.25mg  BID -Continue entresto  24-26mg  BID  #History of CVA -Patient with CVA in 04/2020 where she presented with speech changes, facial droop and mental status changes  Work up in [place] (CTA, MRI, TTE) -MRI reportedly showed acute stroke. -30 day monitor here without Afib -TTE with normal BiV function and small PFO (incidental finding). No significant valve disease -Declined EP referral or loop recorder  -ASA 81mg  daily -Crestor  20mg  daily -Zetia  10mg  daily   #HTN: -Admitted in 05/2022 for hypertensive emergency  -BP is high today     -Continue coreg  6.25mg  BID -Will increase Entresto  to 49/51 bid   Samples given  Call in readings in next several weeks   #HLD: -Continue crestor  20mg  daily -Continue zetia  10mg  daily  Last LDL 33  HDL 39     #COPD: #Asthma: -Continue home inhalers -Continue supplemental O2  #Borderline mild AS: -Continue monitoring with every 2 years     Medication Adjustments/Labs and Tests Ordered: Current medicines are reviewed at length with the patient today.  Concerns regarding medicines are outlined above.  No orders of the defined types were placed in this encounter.  Meds ordered this encounter  Medications   sacubitril -valsartan  (ENTRESTO ) 24-26 MG    Sig: Take 2 tablets by mouth 2 (two) times daily.    Patient Instructions  Medication Instructions:  DOUBLE UP YOUR ENTRESTO  FOR A COUPLE OF WEEKS AND LET US  KNOW HOW YOU ARE DOING/ CHECK YOUR BP  *If you need a refill on your cardiac medications before your next appointment, please call your pharmacy*   Lab Work:  If you have labs (blood work) drawn today and your tests are completely normal, you will receive your results only by: MyChart Message (if you have MyChart)  OR A paper copy in the mail If you have any lab test that is abnormal or we need to change your treatment, we will call you to review the results.   Testing/Procedures:    Follow-Up: At [facility], you and your health needs are our priority.  As part of our continuing mission to provide you with exceptional heart care, we have created designated Provider Care Teams.  These Care Teams include your primary Cardiologist (physician) and Advanced Practice Providers (APPs -  Physician Assistants and Nurse Practitioners) who all work together to provide you with the care you need, when you need it.  We recommend signing up for the patient portal called MyChart.  Sign up information is provided on this After Visit Summary.  MyChart is used to connect with patients for Virtual Visits (Telemedicine).  Patients are able to view lab/test results, encounter notes, upcoming appointments, etc.  Non-urgent messages can be sent to your provider as well.   To learn more about what you can do with MyChart, go to [email or web address].    Your next appointment:  9 MONTHS WITH DR OKEY  Follow up in 6 months.    Signed, Vina Gull, MD  06/25/2023 11:22 PM    [facility]

## 2023-06-24 ENCOUNTER — Encounter: Payer: Self-pay | Admitting: Internal Medicine

## 2023-06-24 ENCOUNTER — Ambulatory Visit: Payer: PPO | Admitting: Internal Medicine

## 2023-06-24 ENCOUNTER — Ambulatory Visit: Payer: PPO | Attending: Internal Medicine | Admitting: Internal Medicine

## 2023-06-24 VITALS — BP 150/80 | HR 62 | Ht 60.0 in | Wt 157.2 lb

## 2023-06-24 DIAGNOSIS — I10 Essential (primary) hypertension: Secondary | ICD-10-CM | POA: Diagnosis not present

## 2023-06-24 MED ORDER — ENTRESTO 24-26 MG PO TABS: 2.0000 | ORAL_TABLET | Freq: Two times a day (BID) | ORAL | Status: DC

## 2023-06-24 NOTE — Patient Instructions (Signed)
 Medication Instructions:  DOUBLE UP YOUR ENTRESTO  FOR A COUPLE OF WEEKS AND LET US  KNOW HOW YOU ARE DOING/ CHECK YOUR BP  *If you need a refill on your cardiac medications before your next appointment, please call your pharmacy*   Lab Work:  If you have labs (blood work) drawn today and your tests are completely normal, you will receive your results only by: MyChart Message (if you have MyChart) OR A paper copy in the mail If you have any lab test that is abnormal or we need to change your treatment, we will call you to review the results.   Testing/Procedures:    Follow-Up: At [facility], you and your health needs are our priority.  As part of our continuing mission to provide you with exceptional heart care, we have created designated Provider Care Teams.  These Care Teams include your primary Cardiologist (physician) and Advanced Practice Providers (APPs -  Physician Assistants and Nurse Practitioners) who all work together to provide you with the care you need, when you need it.  We recommend signing up for the patient portal called MyChart.  Sign up information is provided on this After Visit Summary.  MyChart is used to connect with patients for Virtual Visits (Telemedicine).  Patients are able to view lab/test results, encounter notes, upcoming appointments, etc.  Non-urgent messages can be sent to your provider as well.   To learn more about what you can do with MyChart, go to [email or web address].    Your next appointment:  9 MONTHS WITH DR OKEY

## 2023-06-25 NOTE — Telephone Encounter (Signed)
 Patient has been approved for both (Entresto  & Sprivia medications for the 2025 enrollment year per L. Zink RPH submitted PAP applications.

## 2023-06-27 ENCOUNTER — Encounter (INDEPENDENT_AMBULATORY_CARE_PROVIDER_SITE_OTHER): Payer: PPO | Admitting: Ophthalmology

## 2023-06-30 ENCOUNTER — Encounter: Payer: Self-pay | Admitting: Internal Medicine

## 2023-07-01 ENCOUNTER — Ambulatory Visit (INDEPENDENT_AMBULATORY_CARE_PROVIDER_SITE_OTHER): Payer: PPO

## 2023-07-01 VITALS — Ht 60.0 in | Wt 157.0 lb

## 2023-07-01 DIAGNOSIS — Z0000 Encounter for general adult medical examination without abnormal findings: Secondary | ICD-10-CM | POA: Diagnosis not present

## 2023-07-01 NOTE — Patient Instructions (Addendum)
Ms. Vitullo , Thank you for taking time to come for your Medicare Wellness Visit. I appreciate your ongoing commitment to your health goals. Please review the following plan we discussed and let me know if I can assist you in the future.   Referrals/Orders/Follow-Ups/Clinician Recommendations: none  This is a list of the screening recommended for you and due dates:  Health Maintenance  Topic Date Due   COVID-19 Vaccine (1) Never done   Zoster (Shingles) Vaccine (1 of 2) 02/05/1960   Medicare Annual Wellness Visit  06/30/2024   DTaP/Tdap/Td vaccine (2 - Td or Tdap) 08/14/2025   Pneumonia Vaccine  Completed   HPV Vaccine  Aged Out   Flu Shot  Discontinued   DEXA scan (bone density measurement)  Discontinued    Advanced directives: (In Chart) A copy of your advanced directives are scanned into your chart should your provider ever need it.  Next Medicare Annual Wellness Visit scheduled for next year: Yes 07/01/2024 @ 8:50am telephone

## 2023-07-01 NOTE — Progress Notes (Addendum)
Subjective:   Nancy Merritt is a 83 y.o. female who presents for Medicare Annual (Subsequent) preventive examination.  Visit Complete: Virtual I connected with  [name] on 07/01/23 by a audio enabled telemedicine application and verified that I am speaking with the correct person using two identifiers.  Patient Location: Home  Provider Location: Office/Clinic  I discussed the limitations of evaluation and management by telemedicine. The patient expressed understanding and agreed to proceed.  Vital Signs: Because this visit was a virtual/telehealth visit, some criteria may be missing or patient reported. Any vitals not documented were not able to be obtained and vitals that have been documented are patient reported.  Patient Medicare AWV questionnaire was completed by the patient on (not done); I have confirmed that all information answered by patient is correct and no changes since this date.  Cardiac Risk Factors include: advanced age (>30men, >9 women);dyslipidemia;hypertension;sedentary lifestyle;obesity (BMI >30kg/m2)    Objective:    Today's Vitals   07/01/23 0847 07/01/23 0848  Weight: 157 lb (71.2 kg)   Height: 5' (1.524 m)   PainSc:  0-No pain   Body mass index is 30.66 kg/m.     07/01/2023    9:03 AM 06/19/2023    2:35 PM 03/04/2023   10:34 AM 01/24/2023   10:50 AM 11/05/2022    2:05 PM 09/05/2022    2:20 PM 06/26/2022    9:32 AM  Advanced Directives  Does Patient Have a Medical Advance Directive? Yes Yes Yes Yes Yes Yes Yes  Type of Estate agent of [place];Living will Healthcare Power of [place];Living will Healthcare Power of [place];Living will Healthcare Power of [place];Living will Healthcare Power of [place];Living will Healthcare Power of [place];Living will Healthcare Power of [place];Living will  Does patient want to make changes to medical advance directive?       No - Patient declined  Copy of Healthcare Power of  Attorney in Chart? Yes - validated most recent copy scanned in chart (See row information)     Yes - validated most recent copy scanned in chart (See row information) Yes - validated most recent copy scanned in chart (See row information)  Would patient like information on creating a medical advance directive?       No - Patient declined    Current Medications (verified) Outpatient Encounter Medications as of 07/01/2023  Medication Sig   acetaminophen (TYLENOL) 500 MG tablet Take 1 tablet (500 mg total) by mouth 3 (three) times daily as needed for moderate pain.   albuterol (PROVENTIL) (2.5 MG/3ML) 0.083% nebulizer solution TAKE 3 MLS (2.5 MG TOTAL) BY NEBULIZATION EVERY 6 (SIX) HOURS AS NEEDED FOR SHORTNESS OF BREATH.   albuterol (VENTOLIN HFA) 108 (90 Base) MCG/ACT inhaler Inhale 2 puffs into the lungs every 4 (four) hours as needed for wheezing or shortness of breath.   aspirin 81 MG chewable tablet Chew 1 tablet (81 mg total) by mouth daily.   carvedilol (COREG) 6.25 MG tablet Take 1 tablet (6.25 mg total) by mouth 2 (two) times daily with a meal.   Cholecalciferol (VITAMIN D3) 25 MCG (1000 UT) capsule Take 2 capsules (2,000 Units total) by mouth daily.   clopidogrel (PLAVIX) 75 MG tablet TAKE 1 TABLET BY MOUTH DAILY WITH BREAKFAST.   ezetimibe (ZETIA) 10 MG tablet TAKE 1 TABLET BY MOUTH EVERY DAY   fluticasone (FLONASE) 50 MCG/ACT nasal spray Place 2 sprays into both nostrils daily.   fluticasone furoate-vilanterol (BREO ELLIPTA) 200-25 MCG/ACT AEPB Inhale 1 puff into  the lungs daily.   hydroxyurea (HYDREA) 500 MG capsule Take 3 capsules (1,500 mg total) by mouth daily. May take with food to minimize GI side effects.   loratadine (CLARITIN) 10 MG tablet Take 10 mg by mouth daily.   Multiple Vitamin (MULTIVITAMIN) tablet Take 1 tablet by mouth daily.   Multiple Vitamins-Minerals (PRESERVISION AREDS 2) CAPS Take 2 each by mouth daily at 12 noon.   nitroGLYCERIN (NITROSTAT) 0.4 MG SL tablet  Place 1 tablet (0.4 mg total) under the tongue every 5 (five) minutes as needed for chest pain.   rosuvastatin (CRESTOR) 20 MG tablet Take 1 tablet (20 mg total) by mouth at bedtime.   sacubitril-valsartan (ENTRESTO) 24-26 MG Take 2 tablets by mouth 2 (two) times daily.   sertraline (ZOLOFT) 100 MG tablet Take 1 tablet (100 mg total) by mouth daily.   Tiotropium Bromide Monohydrate (SPIRIVA RESPIMAT) 2.5 MCG/ACT AERS Inhale 2 puffs into the lungs daily.   No facility-administered encounter medications on file as of 07/01/2023.    Allergies (verified) Advair diskus [fluticasone-salmeterol], Levofloxacin, Qvar [beclomethasone], Singulair [montelukast], and Symbicort [budesonide-formoterol fumarate]   History: Past Medical History:  Diagnosis Date   Arthritis    Asthma    Cancer (HCC) 1990   COVID-19 06/14/2020   Depression    Hx of breast cancer 04/07/2018   1990. No screening in last 25 years   Past Surgical History:  Procedure Laterality Date   BREAST SURGERY Left 1990   CORONARY ARTERY BYPASS GRAFT     JOINT REPLACEMENT Right    Hip   LEFT HEART CATH AND CORS/GRAFTS ANGIOGRAPHY N/A 06/05/2022   Procedure: LEFT HEART CATH AND CORS/GRAFTS ANGIOGRAPHY;  Surgeon: Iran Ouch, MD;  Location: [facility];  Service: Cardiovascular;  Laterality: N/A;   OVARIAN CYST REMOVAL  1970   TUBAL LIGATION  1970   Family History  Problem Relation Age of Onset   Cancer Father        lung   Heart attack Father 110   Alcohol abuse Father    Arthritis Father    COPD Father    Diabetes Father    Hypertension Father    Breast cancer Sister 58   Arthritis Sister    Cancer Sister    COPD Sister    Drug abuse Sister    Hypertension Sister    Miscarriages / [place] Sister    Thyroid disease Sister    Arthritis Mother    COPD Mother    Diabetes Mother    Hearing loss Mother    Hypertension Mother    Miscarriages / [place] Mother    Stroke Mother    Alcohol abuse  Brother    Arthritis Brother    Hypertension Brother    Arthritis Daughter    Asthma Daughter    Depression Daughter    Hypertension Daughter    Miscarriages / [place] Daughter    Thyroid disease Daughter    Arthritis Maternal Grandmother    Hypertension Maternal Grandmother    Stroke Maternal Grandmother    Thyroid disease Maternal Grandmother    Alcohol abuse Maternal Grandfather    Arthritis Maternal Grandfather    Cancer Maternal Grandfather    Depression Maternal Grandfather    Hearing loss Maternal Grandfather    Hypertension Maternal Grandfather    Arthritis Sister    Thyroid disease Sister    Thyroid disease Daughter    Hypertension Daughter    Depression Daughter    COPD Daughter  Alcohol abuse Daughter    Arthritis Daughter    Arthritis Daughter    COPD Daughter    Depression Daughter    Hearing loss Daughter    Hypertension Daughter    Thyroid disease Daughter    Social History   Socioeconomic History   Marital status: Widowed    Spouse name: Not on file   Number of children: 3   Years of education: 2 associate degrees   Highest education level: Not on file  Occupational History   Not on file  Tobacco Use   Smoking status: Former    Current packs/day: 0.00    Average packs/day: 1 pack/day for 40.0 years (40.0 ttl pk-yrs)    Types: Cigarettes    Start date: 13    Quit date: 2002    Years since quitting: 23.1   Smokeless tobacco: Never  Vaping Use   Vaping status: Never Used  Substance and Sexual Activity   Alcohol use: Not Currently   Drug use: Never   Sexual activity: Not Currently  Other Topics Concern   Not on file  Social History Narrative   Moved from [place]   Lives with Daughter, Dorene Grebe  (one daughter in [place] and the other in [place]) - 5 granddaughters and 2 great-grandchildren   Enjoys needle work, reading, cook   Exercise: not really, can walk 1/2 a mile at slow pace w/o getting SOB, SOB with stairs   Sleeps on  the ground floor   Retired from Audiological scientist (bookkeeper)   Social Drivers of Corporate investment banker Strain: Low Risk  (07/01/2023)   Overall Financial Resource Strain (CARDIA)    Difficulty of Paying Living Expenses: Not hard at all  Food Insecurity: No Food Insecurity (07/01/2023)   Hunger Vital Sign    Worried About Running Out of Food in the Last Year: Never true    Ran Out of Food in the Last Year: Never true  Transportation Needs: No Transportation Needs (07/01/2023)   PRAPARE - Administrator, Civil Service (Medical): No    Lack of Transportation (Non-Medical): No  Physical Activity: Inactive (07/01/2023)   Exercise Vital Sign    Days of Exercise per Week: 0 days    Minutes of Exercise per Session: 0 min  Stress: No Stress Concern Present (07/01/2023)   [employer] of Occupational Health - Occupational Stress Questionnaire    Feeling of Stress : Not at all  Social Connections: Moderately Isolated (07/01/2023)   Social Connection and Isolation Panel [NHANES]    Frequency of Communication with Friends and Family: Twice a week    Frequency of Social Gatherings with Friends and Family: More than three times a week    Attends Religious Services: More than 4 times per year    Active Member of [employer] or Organizations: No    Attends Banker Meetings: Never    Marital Status: Widowed    Tobacco Counseling Counseling given: Not Answered   Clinical Intake:  Pre-visit preparation completed: Yes  Pain : No/denies pain Pain Score: 0-No pain     BMI - recorded: 30.66 Nutritional Status: BMI > 30  Obese Nutritional Risks: None Diabetes: No  How often do you need to have someone help you when you read instructions, pamphlets, or other written materials from your doctor or pharmacy?: 1 - Never  Interpreter Needed?: No  Comments: lives with daughter & her husband Information entered by :: B.Elora Wolter,LPN   Activities of Daily Living  07/01/2023    9:03 AM  In your present state of health, do you have any difficulty performing the following activities:  Hearing? 1  Vision? 1  Difficulty concentrating or making decisions? 1  Walking or climbing stairs? 0  Dressing or bathing? 0  Doing errands, shopping? 0  Preparing Food and eating ? N  Using the Toilet? N  In the past six months, have you accidently leaked urine? N  Do you have problems with loss of bowel control? N  Managing your Medications? N  Managing your Finances? N  Housekeeping or managing your Housekeeping? N    Patient Care Team: Eustaquio Boyden, MD as PCP - General (Family Medicine) Meriam Sprague, MD (Inactive) as PCP - Cardiology (Cardiology) Luciano Cutter, MD as Consulting Physician (Pulmonary Disease) Kathyrn Sheriff, [facility] (Inactive) as Pharmacist (Pharmacist) Jeralyn Ruths, MD as Consulting Physician (Oncology)  Indicate any recent Medical Services you may have received from other than [facility] providers in the past year (date may be approximate).     Assessment:   This is a routine wellness examination for Omar.  Hearing/Vision screen Hearing Screening - Comments:: Pt says she has hearing aids but wax is keeping her from hearing well. Vision Screening - Comments:: Pt says her vision-Dr [facility] AMD-sees retinologist Dr Ashley Royalty   Goals Addressed             This Visit's Progress    Patient Stated   Not on track    Would like to maintain current routine and add exercise     Patient Stated       I would like to keep on living and maintain my independence       Depression Screen    07/01/2023    8:58 AM 04/29/2023   10:57 AM 03/14/2023   12:07 PM 02/14/2023    2:53 PM 06/26/2022    9:24 AM 06/11/2022    9:05 AM 06/04/2022    1:01 PM  PHQ 2/9 Scores  PHQ - 2 Score 1 2 6  0 0 0 0  PHQ- 9 Score  13 21 2  0 5 2    Fall Risk    07/01/2023    8:51 AM 03/14/2023   12:07 PM 02/14/2023    2:53 PM 08/13/2022    12:38 PM 06/26/2022    9:34 AM  Fall Risk   Falls in the past year? 1 0 0 0 0  Comment fell last thursday-fell back on buttocks      Number falls in past yr: 0    0  Injury with Fall? 0    0  Risk for fall due to : No Fall Risks    No Fall Risks  Follow up Falls prevention discussed;Education provided    Falls prevention discussed;Falls evaluation completed    MEDICARE RISK AT HOME: Medicare Risk at Home Any stairs in or around the home?: Yes If so, are there any without handrails?: Yes Home free of loose throw rugs in walkways, pet beds, electrical cords, etc?: Yes Adequate lighting in your home to reduce risk of falls?: Yes Life alert?: No Use of a cane, walker or w/c?: Yes (cane, walker some times) Grab bars in the bathroom?: Yes Shower chair or bench in shower?: No Elevated toilet seat or a handicapped toilet?: No  TIMED UP AND GO:  Was the test performed?  No    Cognitive Function:    11/18/2019    3:38 PM  MMSE - Mini  Mental State Exam  Not completed: Refused        07/01/2023    9:05 AM 06/26/2022    9:50 AM 11/11/2019    3:25 PM  6CIT Screen  What Year? 0 points 0 points 4 points  What month? 0 points 0 points   What time?  3 points   Count back from 20 0 points 0 points   Months in reverse 0 points 0 points   Repeat phrase 0 points 0 points   Total Score  3 points     Immunizations Immunization History  Administered Date(s) Administered   Fluad Trivalent(High Dose 65+) 02/14/2023   Influenza, High Dose Seasonal PF 04/07/2018   Influenza,inj,Quad PF,6+ Mos 05/19/2019   Pneumococcal Conjugate-13 11/09/2018   Pneumococcal Polysaccharide-23 08/15/2015   Tdap 08/15/2015   Zoster, Live 08/15/2015    TDAP status: Up to date  Flu Vaccine status: Up to date  Pneumococcal vaccine status: Up to date  Covid-19 vaccine status: Declined, Education has been provided regarding the importance of this vaccine but patient still declined. Advised may receive this  vaccine at local pharmacy or Health Dept.or vaccine clinic. Aware to provide a copy of the vaccination record if obtained from local pharmacy or Health Dept. Verbalized acceptance and understanding.  Qualifies for Shingles Vaccine? Yes   Zostavax completed No   Shingrix Completed?: No.    Education has been provided regarding the importance of this vaccine. Patient has been advised to call insurance company to determine out of pocket expense if they have not yet received this vaccine. Advised may also receive vaccine at local pharmacy or Health Dept. Verbalized acceptance and understanding.  Screening Tests Health Maintenance  Topic Date Due   COVID-19 Vaccine (1) Never done   Zoster Vaccines- Shingrix (1 of 2) 02/05/1960   Medicare Annual Wellness (AWV)  06/30/2024   DTaP/Tdap/Td (2 - Td or Tdap) 08/14/2025   Pneumonia Vaccine 2+ Years old  Completed   HPV VACCINES  Aged Out   INFLUENZA VACCINE  Discontinued   DEXA SCAN  Discontinued    Health Maintenance  Health Maintenance Due  Topic Date Due   COVID-19 Vaccine (1) Never done   Zoster Vaccines- Shingrix (1 of 2) 02/05/1960    Colorectal cancer screening: No longer required.   Mammogram status: No longer required due to age.  Lung Cancer Screening: (Low Dose CT Chest recommended if Age 24-80 years, 20 pack-year currently smoking OR have quit w/in 15years.) does not qualify.   Lung Cancer Screening Referral: no  Additional Screening:  Hepatitis C Screening: does not qualify; Completed no  Vision Screening: Recommended annual ophthalmology exams for early detection of glaucoma and other disorders of the eye. Is the patient up to date with their annual eye exam?  Yes  Who is the provider or what is the name of the office in which the patient attends annual eye exams? Dr Dion Body If pt is not established with a provider, would they like to be referred to a provider to establish care? No .   Dental Screening: Recommended  annual dental exams for proper oral hygiene  Diabetic Foot Exam: n/a  Community Resource Referral / Chronic Care Management: CRR required this visit?  No   CCM required this visit?  No     Plan:     I have personally reviewed and noted the following in the patient's chart:   Medical and social history Use of alcohol, tobacco or illicit drugs  Current medications  and supplements including opioid prescriptions. Patient is not currently taking opioid prescriptions. Functional ability and status Nutritional status Physical activity Advanced directives List of other physicians Hospitalizations, surgeries, and ER visits in previous 12 months Vitals Screenings to include cognitive, depression, and falls Referrals and appointments  In addition, I have reviewed and discussed with patient certain preventive protocols, quality metrics, and best practice recommendations. A written personalized care plan for preventive services as well as general preventive health recommendations were provided to patient.     Sue Lush, LPN   1/61/0960   After Visit Summary: (MyChart) Due to this being a telephonic visit, the after visit summary with patients personalized plan was offered to patient via MyChart   Nurse Notes: The patient states she is doing well. She relays she has some deep wax build which causes her not to hear as well. She desires a referral to ENT. She has no other concerns or questions at this time.

## 2023-07-07 ENCOUNTER — Encounter (INDEPENDENT_AMBULATORY_CARE_PROVIDER_SITE_OTHER): Payer: PPO | Admitting: Ophthalmology

## 2023-07-07 DIAGNOSIS — H43813 Vitreous degeneration, bilateral: Secondary | ICD-10-CM | POA: Diagnosis not present

## 2023-07-07 DIAGNOSIS — H35033 Hypertensive retinopathy, bilateral: Secondary | ICD-10-CM | POA: Diagnosis not present

## 2023-07-07 DIAGNOSIS — H353132 Nonexudative age-related macular degeneration, bilateral, intermediate dry stage: Secondary | ICD-10-CM

## 2023-07-07 DIAGNOSIS — I10 Essential (primary) hypertension: Secondary | ICD-10-CM

## 2023-07-27 ENCOUNTER — Other Ambulatory Visit: Payer: Self-pay | Admitting: Family Medicine

## 2023-07-27 DIAGNOSIS — R7303 Prediabetes: Secondary | ICD-10-CM

## 2023-07-27 DIAGNOSIS — I2510 Atherosclerotic heart disease of native coronary artery without angina pectoris: Secondary | ICD-10-CM

## 2023-07-27 DIAGNOSIS — E210 Primary hyperparathyroidism: Secondary | ICD-10-CM

## 2023-07-31 ENCOUNTER — Other Ambulatory Visit: Payer: Self-pay | Admitting: Family Medicine

## 2023-07-31 ENCOUNTER — Inpatient Hospital Stay: Payer: PPO | Attending: Oncology

## 2023-07-31 DIAGNOSIS — J4530 Mild persistent asthma, uncomplicated: Secondary | ICD-10-CM

## 2023-07-31 NOTE — Telephone Encounter (Signed)
 Breo Ellipta Last filled:  05/06/23, #180 Last OV:  04/29/23, 6 wk mood f/u Next OV:  08/15/23, CPE

## 2023-08-01 DIAGNOSIS — H52223 Regular astigmatism, bilateral: Secondary | ICD-10-CM | POA: Diagnosis not present

## 2023-08-01 DIAGNOSIS — H5203 Hypermetropia, bilateral: Secondary | ICD-10-CM | POA: Diagnosis not present

## 2023-08-01 DIAGNOSIS — Z9842 Cataract extraction status, left eye: Secondary | ICD-10-CM | POA: Diagnosis not present

## 2023-08-01 DIAGNOSIS — Z9841 Cataract extraction status, right eye: Secondary | ICD-10-CM | POA: Diagnosis not present

## 2023-08-07 ENCOUNTER — Other Ambulatory Visit: Payer: Self-pay | Admitting: Family Medicine

## 2023-08-07 DIAGNOSIS — F331 Major depressive disorder, recurrent, moderate: Secondary | ICD-10-CM

## 2023-08-07 DIAGNOSIS — J4530 Mild persistent asthma, uncomplicated: Secondary | ICD-10-CM

## 2023-08-08 ENCOUNTER — Other Ambulatory Visit (INDEPENDENT_AMBULATORY_CARE_PROVIDER_SITE_OTHER): Payer: PPO

## 2023-08-08 DIAGNOSIS — R7303 Prediabetes: Secondary | ICD-10-CM | POA: Diagnosis not present

## 2023-08-08 DIAGNOSIS — I2510 Atherosclerotic heart disease of native coronary artery without angina pectoris: Secondary | ICD-10-CM | POA: Diagnosis not present

## 2023-08-08 DIAGNOSIS — E210 Primary hyperparathyroidism: Secondary | ICD-10-CM

## 2023-08-08 LAB — COMPREHENSIVE METABOLIC PANEL
ALT: 12 U/L (ref 0–35)
AST: 15 U/L (ref 0–37)
Albumin: 4.1 g/dL (ref 3.5–5.2)
Alkaline Phosphatase: 109 U/L (ref 39–117)
BUN: 18 mg/dL (ref 6–23)
CO2: 30 meq/L (ref 19–32)
Calcium: 10.4 mg/dL (ref 8.4–10.5)
Chloride: 106 meq/L (ref 96–112)
Creatinine, Ser: 0.82 mg/dL (ref 0.40–1.20)
GFR: 65.65 mL/min (ref >60.00)
Glucose, Bld: 109 mg/dL — ABNORMAL HIGH (ref 70–99)
Potassium: 4.9 meq/L (ref 3.5–5.1)
Sodium: 141 meq/L (ref 135–145)
Total Bilirubin: 0.4 mg/dL (ref 0.2–1.2)
Total Protein: 6.4 g/dL (ref 6.0–8.3)

## 2023-08-08 LAB — LIPID PANEL
Cholesterol: 172 mg/dL (ref 0–200)
HDL: 44.2 mg/dL (ref >39.00)
LDL Cholesterol: 111 mg/dL — ABNORMAL HIGH (ref 0–99)
NonHDL: 127.71
Total CHOL/HDL Ratio: 4
Triglycerides: 84 mg/dL (ref 0.0–149.0)
VLDL: 16.8 mg/dL (ref 0.0–40.0)

## 2023-08-08 LAB — HEMOGLOBIN A1C: Hgb A1c MFr Bld: 5.9 % (ref 4.6–6.5)

## 2023-08-08 LAB — VITAMIN D 25 HYDROXY (VIT D DEFICIENCY, FRACTURES): VITD: 27.99 ng/mL — ABNORMAL LOW (ref 30.00–100.00)

## 2023-08-08 NOTE — Telephone Encounter (Signed)
 Albuterol neb soln  Last filled:  06/17/23, #150 mL Last OV:  04/29/23, 6 wk mood f/u Next OV:  08/15/23, CPE  E-scribed sertraline.

## 2023-08-12 LAB — PARATHYROID HORMONE, INTACT (NO CA): PTH: 102 pg/mL — ABNORMAL HIGH (ref 16–77)

## 2023-08-14 ENCOUNTER — Ambulatory Visit: Payer: PPO | Admitting: Dermatology

## 2023-08-15 ENCOUNTER — Ambulatory Visit (INDEPENDENT_AMBULATORY_CARE_PROVIDER_SITE_OTHER): Payer: PPO | Admitting: Family Medicine

## 2023-08-15 ENCOUNTER — Encounter: Payer: Self-pay | Admitting: Family Medicine

## 2023-08-15 VITALS — BP 138/78 | HR 78 | Temp 97.9°F | Ht 59.75 in | Wt 157.5 lb

## 2023-08-15 DIAGNOSIS — E210 Primary hyperparathyroidism: Secondary | ICD-10-CM | POA: Diagnosis not present

## 2023-08-15 DIAGNOSIS — I7121 Aneurysm of the ascending aorta, without rupture: Secondary | ICD-10-CM

## 2023-08-15 DIAGNOSIS — I10 Essential (primary) hypertension: Secondary | ICD-10-CM

## 2023-08-15 DIAGNOSIS — J411 Mucopurulent chronic bronchitis: Secondary | ICD-10-CM

## 2023-08-15 DIAGNOSIS — H6192 Disorder of left external ear, unspecified: Secondary | ICD-10-CM

## 2023-08-15 DIAGNOSIS — Z7189 Other specified counseling: Secondary | ICD-10-CM

## 2023-08-15 DIAGNOSIS — R7303 Prediabetes: Secondary | ICD-10-CM

## 2023-08-15 DIAGNOSIS — F331 Major depressive disorder, recurrent, moderate: Secondary | ICD-10-CM | POA: Diagnosis not present

## 2023-08-15 DIAGNOSIS — J302 Other seasonal allergic rhinitis: Secondary | ICD-10-CM

## 2023-08-15 DIAGNOSIS — D473 Essential (hemorrhagic) thrombocythemia: Secondary | ICD-10-CM | POA: Diagnosis not present

## 2023-08-15 DIAGNOSIS — J441 Chronic obstructive pulmonary disease with (acute) exacerbation: Secondary | ICD-10-CM | POA: Diagnosis not present

## 2023-08-15 DIAGNOSIS — H6123 Impacted cerumen, bilateral: Secondary | ICD-10-CM

## 2023-08-15 DIAGNOSIS — Z8673 Personal history of transient ischemic attack (TIA), and cerebral infarction without residual deficits: Secondary | ICD-10-CM

## 2023-08-15 DIAGNOSIS — J4530 Mild persistent asthma, uncomplicated: Secondary | ICD-10-CM

## 2023-08-15 DIAGNOSIS — Z0001 Encounter for general adult medical examination with abnormal findings: Secondary | ICD-10-CM | POA: Diagnosis not present

## 2023-08-15 DIAGNOSIS — Z853 Personal history of malignant neoplasm of breast: Secondary | ICD-10-CM

## 2023-08-15 DIAGNOSIS — I5042 Chronic combined systolic (congestive) and diastolic (congestive) heart failure: Secondary | ICD-10-CM

## 2023-08-15 MED ORDER — IPRATROPIUM-ALBUTEROL 0.5-2.5 (3) MG/3ML IN SOLN
3.0000 mL | Freq: Four times a day (QID) | RESPIRATORY_TRACT | 1 refills | Status: DC | PRN
Start: 2023-08-15 — End: 2023-12-29

## 2023-08-15 MED ORDER — FLUTICASONE PROPIONATE 50 MCG/ACT NA SUSP
2.0000 | Freq: Every day | NASAL | 12 refills | Status: AC
Start: 2023-08-15 — End: ?

## 2023-08-15 MED ORDER — IPRATROPIUM-ALBUTEROL 0.5-2.5 (3) MG/3ML IN SOLN
3.0000 mL | Freq: Once | RESPIRATORY_TRACT | Status: AC
Start: 2023-08-15 — End: 2023-08-15
  Administered 2023-08-15: 3 mL via RESPIRATORY_TRACT

## 2023-08-15 MED ORDER — SERTRALINE HCL 100 MG PO TABS: 100.0000 mg | ORAL_TABLET | Freq: Every day | ORAL | 4 refills | Status: AC

## 2023-08-15 MED ORDER — PREDNISONE 20 MG PO TABS: 40.0000 mg | ORAL_TABLET | Freq: Every day | ORAL | 0 refills | Status: AC

## 2023-08-15 MED ORDER — DOXYCYCLINE HYCLATE 100 MG PO TABS: 100.0000 mg | ORAL_TABLET | Freq: Two times a day (BID) | ORAL | 0 refills | Status: DC

## 2023-08-15 NOTE — Patient Instructions (Addendum)
 I will check into ENT appointment.  We will refer you back to Pulmonology  Albuterol /atrovent nebulizer treatment today  Start prednisone steroid burst 40mg  daily for 5 days. Start doxycycline antibiotic as well.  May use duonebs at home - sent to pharmacy in place of plain albuterol nebulizer and spiriva inhaler. If you stop duonebs, may restart spiriva.  If interested, check with pharmacy about new 2 shot shingles series (shingrix) and RSV shot.  Continue daytime oxygen, start using oxygen at night time.  Good to see you today Return as needed or in 3-4 months for follow up visit

## 2023-08-15 NOTE — Progress Notes (Signed)
 Ph: [phone] Fax: [phone]   [name], female    DOB: [birth year], 83 y.o.   MRN: [id number]  This visit was conducted in person.  BP 138/78   Pulse 78   Temp 97.9 F (36.6 C) (Oral)   Ht 4' 11.75" (1.518 m)   Wt 157 lb 8 oz (71.4 kg)   SpO2 94% Comment: 2 L O2  BMI 31.02 kg/m    CC: CPE Subjective:   HPI: Nancy Merritt is a 83 y.o. female presenting on 08/15/2023 for Annual Exam (MCR prt 2 [AWV- 07/01/23]. Pt accompanied by daughter, Dorene Grebe. )   Saw health advisor 06/2023 for medicare wellness visit. Note reviewed.   No results found.  Flowsheet Row Clinical Support from 07/01/2023 in [facility]  PHQ-2 Total Score 1          07/01/2023    8:51 AM 03/14/2023   12:07 PM 02/14/2023    2:53 PM 08/13/2022   12:38 PM 06/26/2022    9:34 AM  Fall Risk   Falls in the past year? 1 0 0 0 0  Comment fell last thursday-fell back on buttocks      Number falls in past yr: 0    0  Injury with Fall? 0    0  Risk for fall due to : No Fall Risks    No Fall Risks  Follow up Falls prevention discussed;Education provided    Falls prevention discussed;Falls evaluation completed   ?combined COPD/asthma given reversibility noted on PFTs - continues breo ellipta and spiriva. No recent albuterol use.  1+ wk of worsening dyspnea and wheeze, worse with any exertion, without increased cough or sputum production, or fevers/chills. New oxygen requirement over the past few days as well.  Normally not using nocturnal O2 This is despite regular controller inhaler use as well as albuterol neb use Q6 hours. She is not using albuterol inhaler.  Last saw pulm Dr Everardo All 05/2018.  CTA Chest r/o PE 05/2023: Diffuse bilateral bronchial wall thickening and patchy areas of mucous plugging. Widespread bilateral tree-in-bud density and scattered pulmonary nodules, measuring up to 6 mm in the left lower lobe. Findings are favored to be due to infectious  or inflammatory, consider atypical infection.  Significant deep wax in ear canal - requests ENT referral.  [facility] - told needed cerumen disimpaction.  Last referred 01/2023 - has not seen yet   Hospitalization for NSTEMI last year, followed by cardiology Dr Tenny Craw. She was unable to participate in cardiac rehab due to lack of transportation. Receiving Entresto through [facility] (through 05/19/2024). Higher Entresto caused malaise.   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    Preventative: Colon cancer screening - states had normal Cologuard in [place], aged out Lung cancer screening - aged out  Breast cancer screening - declines repeat despite personal hx L breast cancer - recommend at least monthly self breast exams at home.  Well woman exam - aged out - no vaginal bleeding, pelvic pain or skin changes DEXA scan - unsure she's had. Declines. Good calcium in diet - good cheese and yogurt and spinach. Continues vit D 1000 IU daily. She walks on treadmill daily.  Flu shot - 01/2023 COVID shot - declined Tdap 2017 Prevnar13 2020, pneumovax 2017 Xostavax 2017 Shingrix - discussed, declines  RSV - discussed - will check with pharmacy Advanced directive discussion - daughter is  HCPOA. DNR in the past - will need to verify.  Seat belt use discussed Sunscreen use discussed. No changing moles on skin. Ex smoker - quit 2001! Alcohol - none Dentist - full dentures  Eye exam - yearly - sees retinologist for ARMD Bowel - no constipation Bladder - rare stress incontinence, wears a pad   Moved from [facility] Lives with Daughter, Dorene Grebe  (one daughter in [place] and the other in [place]) - 5 granddaughters and 2 great-grandchildren Enjoys needle work, reading, cook Exercise: not really, can walk 1/2 a mile at slow pace w/o getting SOB, SOB with stairs Sleeps on the ground floor Retired from  Audiological scientist (bookkeeper)     Relevant past medical, surgical, family and social history reviewed and updated as indicated. Interim medical history since our last visit reviewed. Allergies and medications reviewed and updated. Outpatient Medications Prior to Visit  Medication Sig Dispense Refill   acetaminophen (TYLENOL) 500 MG tablet Take 1 tablet (500 mg total) by mouth 3 (three) times daily as needed for moderate pain.     albuterol (PROVENTIL) (2.5 MG/3ML) 0.083% nebulizer solution TAKE 3 MLS (2.5 MG TOTAL) BY NEBULIZATION EVERY 6 (SIX) HOURS AS NEEDED FOR SHORTNESS OF BREATH. 150 mL 1   albuterol (VENTOLIN HFA) 108 (90 Base) MCG/ACT inhaler Inhale 2 puffs into the lungs every 4 (four) hours as needed for wheezing or shortness of breath. 6.7 g 3   aspirin 81 MG chewable tablet Chew 1 tablet (81 mg total) by mouth daily.     BREO ELLIPTA 200-25 MCG/ACT AEPB TAKE 1 PUFF BY MOUTH EVERY DAY 180 each 1   carvedilol (COREG) 6.25 MG tablet Take 1 tablet (6.25 mg total) by mouth 2 (two) times daily with a meal. 60 tablet 11   Cholecalciferol (VITAMIN D3) 25 MCG (1000 UT) capsule Take 2 capsules (2,000 Units total) by mouth daily. 30 capsule    clopidogrel (PLAVIX) 75 MG tablet TAKE 1 TABLET BY MOUTH DAILY WITH BREAKFAST. 90 tablet 2   ezetimibe (ZETIA) 10 MG tablet TAKE 1 TABLET BY MOUTH EVERY DAY 90 tablet 3   hydroxyurea (HYDREA) 500 MG capsule Take 3 capsules (1,500 mg total) by mouth daily. May take with food to minimize GI side effects. 270 capsule 2   loratadine (CLARITIN) 10 MG tablet Take 10 mg by mouth daily.     Multiple Vitamin (MULTIVITAMIN) tablet Take 1 tablet by mouth daily.     Multiple Vitamins-Minerals (PRESERVISION AREDS 2) CAPS Take 2 each by mouth daily at 12 noon.     nitroGLYCERIN (NITROSTAT) 0.4 MG SL tablet Place 1 tablet (0.4 mg total) under the tongue every 5 (five) minutes as needed for chest pain. 25 tablet 3   rosuvastatin (CRESTOR) 20 MG tablet Take 1 tablet (20 mg  total) by mouth at bedtime. 90 tablet 3   sacubitril-valsartan (ENTRESTO) 24-26 MG Take 2 tablets by mouth 2 (two) times daily.     Tiotropium Bromide Monohydrate (SPIRIVA RESPIMAT) 2.5 MCG/ACT AERS Inhale 2 puffs into the lungs daily. 12 g 3   fluticasone (FLONASE) 50 MCG/ACT nasal spray Place 2 sprays into both nostrils daily. 16 g 6   sertraline (ZOLOFT) 100 MG tablet TAKE 1 TABLET BY MOUTH EVERY DAY 30 tablet 0   No facility-administered medications prior to visit.     Per HPI unless specifically indicated in ROS section below Review of Systems  Constitutional:  Negative for activity change, appetite change, chills, fatigue, fever and unexpected weight change.  HENT:  Negative for hearing loss.   Eyes:  Negative for visual disturbance.  Respiratory:  Positive for chest tightness and shortness of breath. Negative for cough and wheezing.   Cardiovascular:  Negative for chest pain, palpitations and leg swelling.  Gastrointestinal:  Negative for abdominal distention, abdominal pain, blood in stool, constipation, diarrhea, nausea and vomiting.  Genitourinary:  Negative for difficulty urinating and hematuria.  Musculoskeletal:  Negative for arthralgias, myalgias and neck pain.  Skin:  Negative for rash.  Neurological:  Positive for headaches (due to elevated BP). Negative for dizziness, seizures and syncope.  Hematological:  Negative for adenopathy. Bruises/bleeds easily.  Psychiatric/Behavioral:  Negative for dysphoric mood. The patient is not nervous/anxious.     Objective:  BP 138/78   Pulse 78   Temp 97.9 F (36.6 C) (Oral)   Ht 4' 11.75" (1.518 m)   Wt 157 lb 8 oz (71.4 kg)   SpO2 94% Comment: 2 L O2  BMI 31.02 kg/m   Wt Readings from Last 3 Encounters:  08/15/23 157 lb 8 oz (71.4 kg)  07/01/23 157 lb (71.2 kg)  06/24/23 157 lb 3.2 oz (71.3 kg)      Physical Exam Vitals and nursing note reviewed.  Constitutional:      Appearance: Normal appearance. She is not  ill-appearing.     Comments: Supplemental O2 via [place]  HENT:     Head: Normocephalic and atraumatic.     Right Ear: Ear canal and external ear normal. There is impacted cerumen.     Left Ear: Ear canal and external ear normal. There is impacted cerumen.     Ears:      Comments: Indurated hyperkeratotic nodule to superior L helix    Nose: Nose normal.     Mouth/Throat:     Mouth: Mucous membranes are moist.     Pharynx: Oropharynx is clear. No oropharyngeal exudate or posterior oropharyngeal erythema.  Eyes:     General:        Right eye: No discharge.        Left eye: No discharge.     Extraocular Movements: Extraocular movements intact.     Conjunctiva/sclera: Conjunctivae normal.     Pupils: Pupils are equal, round, and reactive to light.  Neck:     Thyroid: No thyroid mass or thyromegaly.  Cardiovascular:     Rate and Rhythm: Normal rate and regular rhythm.     Pulses: Normal pulses.     Heart sounds: Normal heart sounds. No murmur heard. Pulmonary:     Effort: Pulmonary effort is normal. No respiratory distress.     Breath sounds: Wheezing and rhonchi present. No rales.     Comments: Coarse sounding throughout Abdominal:     General: Bowel sounds are normal. There is no distension.     Palpations: Abdomen is soft. There is no mass.     Tenderness: There is no abdominal tenderness. There is no guarding or rebound.     Hernia: No hernia is present.  Musculoskeletal:     Cervical back: Normal range of motion and neck supple. No rigidity.     Right lower leg: No edema.     Left lower leg: No edema.  Lymphadenopathy:     Cervical: No cervical adenopathy.  Skin:    General: Skin is warm and dry.     Findings: No rash.  Neurological:     General: No focal deficit present.     Mental Status: She is alert. Mental status is  at baseline.  Psychiatric:        Mood and Affect: Mood normal.        Behavior: Behavior normal.       Results for orders placed or performed in  visit on 08/08/23  Lipid panel   Collection Time: 08/08/23 10:41 AM  Result Value Ref Range   Cholesterol 172 0 - 200 mg/dL   Triglycerides 16.1 0.0 - 149.0 mg/dL   HDL 09.60 >45.40 mg/dL   VLDL 98.1 0.0 - 19.1 mg/dL   LDL Cholesterol 478 (H) 0 - 99 mg/dL   Total CHOL/HDL Ratio 4    NonHDL 127.71   VITAMIN D 25 Hydroxy (Vit-D Deficiency, Fractures)   Collection Time: 08/08/23 10:41 AM  Result Value Ref Range   VITD 27.99 (L) 30.00 - 100.00 ng/mL  Parathyroid hormone, intact (no Ca)   Collection Time: 08/08/23 10:41 AM  Result Value Ref Range   PTH 102 (H) 16 - 77 pg/mL  Comprehensive metabolic panel   Collection Time: 08/08/23 10:41 AM  Result Value Ref Range   Sodium 141 135 - 145 mEq/L   Potassium 4.9 3.5 - 5.1 mEq/L   Chloride 106 96 - 112 mEq/L   CO2 30 19 - 32 mEq/L   Glucose, Bld 109 (H) 70 - 99 mg/dL   BUN 18 6 - 23 mg/dL   Creatinine, Ser 2.95 0.40 - 1.20 mg/dL   Total Bilirubin 0.4 0.2 - 1.2 mg/dL   Alkaline Phosphatase 109 39 - 117 U/L   AST 15 0 - 37 U/L   ALT 12 0 - 35 U/L   Total Protein 6.4 6.0 - 8.3 g/dL   Albumin 4.1 3.5 - 5.2 g/dL   GFR 62.13 >08.65 mL/min   Calcium 10.4 8.4 - 10.5 mg/dL  Hemoglobin H8I   Collection Time: 08/08/23 10:41 AM  Result Value Ref Range   Hgb A1c MFr Bld 5.9 4.6 - 6.5 %    Assessment & Plan:   Problem List Items Addressed This Visit     Advance directive discussed with patient (Chronic)   Advanced directive discussion - daughter is HCPOA. DNR previously - will need to verify.       Encounter for general adult medical examination with abnormal findings - Primary (Chronic)   Preventative protocols reviewed and updated unless pt declined. Discussed healthy diet and lifestyle.       Mild persistent asthma   Rx albuterol/atrovent neb in office today, then treat for COPD exacerbation as per below.       Relevant Medications   predniSONE (DELTASONE) 20 MG tablet   ipratropium-albuterol (DUONEB) 0.5-2.5 (3) MG/3ML SOLN    Other Relevant Orders   Ambulatory referral to Pulmonology   Hypertension   Chronic, stable on current regimen - continue.       Hx of breast cancer   Has declined repeat mammogram.       Primary hyperparathyroidism (HCC)   PTH again elevated.  High normal calcium levels No h/o kidney stones, osteoporosis.  Doubt she would be surgical candidate.  Will continue to monitor levels.  She may consider DEXA for further evaluation - will further discuss at follow up visit.Marland Kitchen       COPD (chronic obstructive pulmonary disease) (HCC)   ?combined COPD/asthma, currently in exacerbation. Rx albuterol/atrovent nebulizer in office with improvement in air movement.  Suspect may not be getting full Breo/spiriva medication due to inhalation limitations - will change Spiriva to Duonebs TID PRN. Consider inhaled steroid nebulizer.  She has started using daytime supplemental oxygen with portable oxygen concentrator - recommend also start nocturnal oxygen.New oxygen requirement - suggested CXR, she declined.  Rx prednisone burst and doxycycline 10d course.        Relevant Medications   fluticasone (FLONASE) 50 MCG/ACT nasal spray   predniSONE (DELTASONE) 20 MG tablet   ipratropium-albuterol (DUONEB) 0.5-2.5 (3) MG/3ML SOLN   Other Relevant Orders   Ambulatory referral to Pulmonology   History of CVA (cerebrovascular accident) without residual deficits   Continue aspirin, plavix, zetia, crestor.      Hearing loss of both ears due to cerumen impaction   Referral to ENT for ear cerumen removal placed 01/2023 last year, still has not been scheduled. I will send another message to referral coordinators to help schedule this.       Prediabetes   Reviewed limiting added sugar in diet.       Essential thrombocytosis (HCC)   Appreciate heme/onc care.  Continue hydroxyurea 1500mg  daily.       Relevant Medications   predniSONE (DELTASONE) 20 MG tablet   doxycycline (VIBRA-TABS) 100 MG tablet    Chronic combined systolic and diastolic heart failure (HCC)   Chronic, seems euvolemic off diuretic.  Appreciate cardiology care.  Continues carvedilol and entresto.       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      MDD (major depressive disorder), recurrent episode, moderate (HCC)   Chronic, stable period on sertraline 100mg  daily - continue.       Relevant Medications   sertraline (ZOLOFT) 100 MG tablet   Thoracic ascending aortic aneurysm (HCC)   4cm by CTA chest 05/2023 - consider yearly monitoring        Meds ordered this encounter  Medications   fluticasone (FLONASE) 50 MCG/ACT nasal spray    Sig: Place 2 sprays into both nostrils daily.    Dispense:  16 g    Refill:  12   sertraline (ZOLOFT) 100 MG tablet    Sig: Take 1 tablet (100 mg total) by mouth daily.    Dispense:  90 tablet    Refill:  4   predniSONE (DELTASONE) 20 MG tablet    Sig: Take 2 tablets (40 mg total) by mouth daily for 5 days.    Dispense:  10 tablet    Refill:  0   ipratropium-albuterol (DUONEB) 0.5-2.5 (3) MG/3ML nebulizer solution 3 mL   ipratropium-albuterol (DUONEB) 0.5-2.5 (3) MG/3ML SOLN    Sig: Take 3 mLs by nebulization every 6 (six) hours as needed.    Dispense:  360 mL    Refill:  1    In place of albuterol and spiriva   doxycycline (VIBRA-TABS) 100 MG tablet    Sig: Take 1 tablet (100 mg total) by mouth 2 (two) times daily.    Dispense:  20 tablet    Refill:  0    Orders Placed This Encounter  Procedures   Ambulatory referral to Pulmonology    Referral Priority:   Routine    Referral Type:   Consultation    Referral Reason:   Specialty Services Required    Requested Specialty:   Pulmonary Disease    Number of Visits Requested:   1    Patient Instructions  I will check into ENT  appointment.  We will refer you back to Pulmonology  Albuterol /atrovent nebulizer treatment today  Start prednisone steroid burst 40mg  daily for 5 days. Start doxycycline antibiotic as well.  May use duonebs at home - sent to pharmacy in place of plain albuterol nebulizer and spiriva inhaler. If you stop duonebs, may restart spiriva.  If interested, check with pharmacy about new 2 shot shingles series (shingrix) and RSV shot.  Continue daytime oxygen, start using oxygen at night time.  Good to see you today Return as needed or in 3-4 months for follow up visit  Follow up plan: Return in about 3 months (around 11/15/2023) for follow up visit.  Eustaquio Boyden, MD

## 2023-08-17 ENCOUNTER — Encounter: Payer: Self-pay | Admitting: Family Medicine

## 2023-08-17 NOTE — Assessment & Plan Note (Signed)
 Rx albuterol/atrovent neb in office today, then treat for COPD exacerbation as per below.

## 2023-08-17 NOTE — Assessment & Plan Note (Addendum)
 Chronic, stable period on sertraline 100mg  daily - continue.

## 2023-08-17 NOTE — Assessment & Plan Note (Signed)
 Advanced directive discussion - daughter is HCPOA. DNR previously - will need to verify.

## 2023-08-17 NOTE — Assessment & Plan Note (Signed)
 Preventative protocols reviewed and updated unless pt declined. Discussed healthy diet and lifestyle.

## 2023-08-17 NOTE — Assessment & Plan Note (Addendum)
?  combined COPD/asthma, currently in exacerbation. Rx albuterol/atrovent nebulizer in office with improvement in air movement.  Suspect may not be getting full Breo/spiriva medication due to inhalation limitations - will change Spiriva to Duonebs TID PRN. Consider inhaled steroid nebulizer.  She has started using daytime supplemental oxygen with portable oxygen concentrator - recommend also start nocturnal oxygen.New oxygen requirement - suggested CXR, she declined.  Rx prednisone burst and doxycycline 10d course.

## 2023-08-17 NOTE — Progress Notes (Incomplete)
 Ph: [phone] Fax: [phone]   [name], female    DOB: 03/09/1941, 83 y.o.   MRN: [id number]  This visit was conducted in person.  BP 138/78   Pulse 78   Temp 97.9 F (36.6 C) (Oral)   Ht 4' 11.75" (1.518 m)   Wt 157 lb 8 oz (71.4 kg)   SpO2 94% Comment: 2 L O2  BMI 31.02 kg/m    CC: CPE Subjective:   HPI: Nancy Merritt is a 83 y.o. female presenting on 08/15/2023 for Annual Exam (MCR prt 2 [AWV- 07/01/23]. Pt accompanied by daughter, Dorene Grebe. )   Saw health advisor 06/2023 for medicare wellness visit. Note reviewed.   No results found.  Flowsheet Row Clinical Support from 07/01/2023 in [facility] [place]  PHQ-2 Total Score 1          07/01/2023    8:51 AM 03/14/2023   12:07 PM 02/14/2023    2:53 PM 08/13/2022   12:38 PM 06/26/2022    9:34 AM  Fall Risk   Falls in the past year? 1 0 0 0 0  Comment fell last thursday-fell back on buttocks      Number falls in past yr: 0    0  Injury with Fall? 0    0  Risk for fall due to : No Fall Risks    No Fall Risks  Follow up Falls prevention discussed;Education provided    Falls prevention discussed;Falls evaluation completed   ?combined COPD/asthma given reversibility noted on PFTs - continues breo ellipta and spiriva. No recent albuterol use.  1+ wk of worsening dyspnea and wheeze, worse with any exertion, without increased cough or sputum production, or fevers/chills. New oxygen requirement over the past few days as well.  Normally not using nocturnal O2 This is despite regular controller inhaler use as well as albuterol neb use Q6 hours. She is not using albuterol inhaler.  Last saw pulm Dr Everardo All 05/2018.   Significant deep wax in ear canal - requests ENT referral.  [facility] - told needed cerumen disimpaction.  Last referred 01/2023 - has not seen yet   Hospitalization for NSTEMI last year, followed by cardiology Dr Tenny Craw. She was unable to  participate in cardiac rehab due to lack of transportation. Receiving Entresto through [facility] (through 05/19/2024). Higher Entresto caused malaise.   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    Preventative: Colon cancer screening - states had normal Cologuard in [place], aged out Lung cancer screening - aged out  Breast cancer screening - declines repeat despite personal hx L breast cancer - recommend at least monthly self breast exams at home.  Well woman exam - aged out - no vaginal bleeding, pelvic pain or skin changes DEXA scan - unsure she's had. Declines. Good calcium in diet - good cheese and yogurt and spinach. Continues vit D 1000 IU daily. She walks on treadmill daily.  Flu shot - 01/2023 COVID shot - declined Tdap 2017 Prevnar13 2020, pneumovax 2017 Xostavax 2017 Shingrix - discussed, declines  RSV - discussed - will check with pharmacy Advanced directive discussion - daughter is HCPOA. DNR in the past - will need to verify.  Seat belt use discussed Sunscreen use discussed. No changing moles on skin. Ex smoker - quit 2001! 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 Interim medical history since our last visit reviewed. Allergies and medications reviewed and updated. Outpatient Medications Prior to Visit  Medication Sig Dispense Refill  .  acetaminophen (TYLENOL) 500 MG tablet Take 1 tablet (500 mg total) by mouth 3 (three) times daily as needed for moderate pain.    Marland Kitchen albuterol (PROVENTIL) (2.5 MG/3ML) 0.083% nebulizer solution TAKE 3 MLS (2.5 MG TOTAL) BY NEBULIZATION EVERY 6 (SIX) HOURS AS NEEDED FOR SHORTNESS OF BREATH. 150 mL 1  . albuterol (VENTOLIN HFA) 108 (90 Base) MCG/ACT inhaler Inhale 2 puffs into the lungs every 4 (four) hours as needed for wheezing or shortness of breath. 6.7 g 3  . aspirin 81 MG chewable tablet Chew 1 tablet (81 mg total) by mouth daily.    Marland Kitchen BREO ELLIPTA 200-25 MCG/ACT AEPB TAKE 1 PUFF BY MOUTH EVERY DAY 180 each 1  . carvedilol (COREG) 6.25 MG tablet Take 1 tablet (6.25 mg total) by mouth 2 (two) times daily with a meal. 60 tablet 11  . Cholecalciferol (VITAMIN D3) 25 MCG (1000 UT) capsule Take 2 capsules (2,000 Units total) by mouth daily. 30 capsule   . clopidogrel (PLAVIX) 75 MG tablet TAKE 1 TABLET BY MOUTH DAILY WITH BREAKFAST. 90 tablet 2  . ezetimibe (ZETIA) 10 MG tablet TAKE 1 TABLET BY MOUTH EVERY DAY 90 tablet 3  . hydroxyurea (HYDREA) 500 MG capsule Take 3 capsules (1,500 mg total) by mouth daily. May take with food to minimize GI side effects. 270 capsule 2  . loratadine (CLARITIN) 10 MG tablet Take 10 mg by mouth daily.    . Multiple Vitamin (MULTIVITAMIN) tablet Take 1 tablet by mouth daily.    . Multiple Vitamins-Minerals (PRESERVISION AREDS 2) CAPS Take 2 each by mouth daily at 12 noon.    . nitroGLYCERIN (NITROSTAT) 0.4 MG SL tablet Place 1 tablet (0.4 mg total) under the tongue every 5 (five) minutes as needed for chest pain. 25 tablet 3  . rosuvastatin (CRESTOR) 20 MG tablet Take 1 tablet (20 mg total) by mouth at bedtime. 90 tablet 3  . sacubitril-valsartan (ENTRESTO) 24-26 MG Take 2 tablets by mouth 2 (two) times daily.    . Tiotropium Bromide Monohydrate (SPIRIVA RESPIMAT) 2.5 MCG/ACT AERS Inhale 2 puffs into the lungs daily. 12 g 3  . fluticasone (FLONASE) 50 MCG/ACT nasal spray  Place 2 sprays into both nostrils daily. 16 g 6  . sertraline (ZOLOFT) 100 MG tablet TAKE 1 TABLET BY MOUTH EVERY DAY 30 tablet 0   No facility-administered medications prior to visit.     Per HPI unless specifically indicated in ROS section below Review of Systems  Constitutional:  Negative for activity change, appetite change, chills, fatigue, fever and unexpected weight change.  HENT:  Negative for hearing loss.   Eyes:  Negative for visual disturbance.  Respiratory:  Positive for chest tightness and shortness of breath. Negative for cough and wheezing.   Cardiovascular:  Negative for chest pain, palpitations and leg swelling.  Gastrointestinal:  Negative for abdominal  distention, abdominal pain, blood in stool, constipation, diarrhea, nausea and vomiting.  Genitourinary:  Negative for difficulty urinating and hematuria.  Musculoskeletal:  Negative for arthralgias, myalgias and neck pain.  Skin:  Negative for rash.  Neurological:  Positive for headaches (due to elevated BP). Negative for dizziness, seizures and syncope.  Hematological:  Negative for adenopathy. Bruises/bleeds easily.  Psychiatric/Behavioral:  Negative for dysphoric mood. The patient is not nervous/anxious.     Objective:  BP 138/78   Pulse 78   Temp 97.9 F (36.6 C) (Oral)   Ht 4' 11.75" (1.518 m)   Wt 157 lb 8 oz (71.4 kg)   SpO2 94% Comment: 2 L O2  BMI 31.02 kg/m   Wt Readings from Last 3 Encounters:  08/15/23 157 lb 8 oz (71.4 kg)  07/01/23 157 lb (71.2 kg)  06/24/23 157 lb 3.2 oz (71.3 kg)      Physical Exam Vitals and nursing note reviewed.  Constitutional:      Appearance: Normal appearance. She is not ill-appearing.     Comments: Supplemental O2 via [place]  HENT:     Head: Normocephalic and atraumatic.     Right Ear: Ear canal and external ear normal. There is impacted cerumen.     Left Ear: Ear canal and external ear normal. There is impacted cerumen.     Ears:      Comments: Indurated  hyperkeratotic nodule to superior L helix    Nose: Nose normal.     Mouth/Throat:     Mouth: Mucous membranes are moist.     Pharynx: Oropharynx is clear. No oropharyngeal exudate or posterior oropharyngeal erythema.  Eyes:     General:        Right eye: No discharge.        Left eye: No discharge.     Extraocular Movements: Extraocular movements intact.     Conjunctiva/sclera: Conjunctivae normal.     Pupils: Pupils are equal, round, and reactive to light.  Neck:     Thyroid: No thyroid mass or thyromegaly.  Cardiovascular:     Rate and Rhythm: Normal rate and regular rhythm.     Pulses: Normal pulses.     Heart sounds: Normal heart sounds. No murmur heard. Pulmonary:     Effort: Pulmonary effort is normal. No respiratory distress.     Breath sounds: Wheezing and rhonchi present. No rales.     Comments: Coarse sounding throughout Abdominal:     General: Bowel sounds are normal. There is no distension.     Palpations: Abdomen is soft. There is no mass.     Tenderness: There is no abdominal tenderness. There is no guarding or rebound.     Hernia: No hernia is present.  Musculoskeletal:     Cervical back: Normal range of motion and neck supple. No rigidity.     Right lower leg: No edema.     Left lower leg: No edema.  Lymphadenopathy:     Cervical: No cervical adenopathy.  Skin:    General: Skin is warm and dry.     Findings: No rash.  Neurological:     General: No focal deficit present.     Mental Status: She is alert. Mental status is at baseline.  Psychiatric:        Mood and Affect: Mood normal.        Behavior: Behavior normal.       Results for orders placed or performed in visit on 08/08/23  Lipid panel  Collection Time: 08/08/23 10:41 AM  Result Value Ref Range   Cholesterol 172 0 - 200 mg/dL   Triglycerides 16.1 0.0 - 149.0 mg/dL   HDL 09.60 >45.40 mg/dL   VLDL 98.1 0.0 - 19.1 mg/dL   LDL Cholesterol 478 (H) 0 - 99 mg/dL   Total CHOL/HDL Ratio 4     NonHDL 127.71   VITAMIN D 25 Hydroxy (Vit-D Deficiency, Fractures)   Collection Time: 08/08/23 10:41 AM  Result Value Ref Range   VITD 27.99 (L) 30.00 - 100.00 ng/mL  Parathyroid hormone, intact (no Ca)   Collection Time: 08/08/23 10:41 AM  Result Value Ref Range   PTH 102 (H) 16 - 77 pg/mL  Comprehensive metabolic panel   Collection Time: 08/08/23 10:41 AM  Result Value Ref Range   Sodium 141 135 - 145 mEq/L   Potassium 4.9 3.5 - 5.1 mEq/L   Chloride 106 96 - 112 mEq/L   CO2 30 19 - 32 mEq/L   Glucose, Bld 109 (H) 70 - 99 mg/dL   BUN 18 6 - 23 mg/dL   Creatinine, Ser 2.95 0.40 - 1.20 mg/dL   Total Bilirubin 0.4 0.2 - 1.2 mg/dL   Alkaline Phosphatase 109 39 - 117 U/L   AST 15 0 - 37 U/L   ALT 12 0 - 35 U/L   Total Protein 6.4 6.0 - 8.3 g/dL   Albumin 4.1 3.5 - 5.2 g/dL   GFR 62.13 >08.65 mL/min   Calcium 10.4 8.4 - 10.5 mg/dL  Hemoglobin H8I   Collection Time: 08/08/23 10:41 AM  Result Value Ref Range   Hgb A1c MFr Bld 5.9 4.6 - 6.5 %    Assessment & Plan:   Problem List Items Addressed This Visit     Encounter for general adult medical examination with abnormal findings - Primary (Chronic)   Preventative protocols reviewed and updated unless pt declined. Discussed healthy diet and lifestyle.       Mild persistent asthma   Relevant Medications   predniSONE (DELTASONE) 20 MG tablet   ipratropium-albuterol (DUONEB) 0.5-2.5 (3) MG/3ML SOLN   COPD (chronic obstructive pulmonary disease) (HCC)   ?combined COPD/asthma, currently in exacerbation. Rx albuterol/atrovent nebulizer in office with improvement in air movement.  Suspect may not be getting full Breo/spiriva medication due to inhalation limitations - will change Spiriva to Duonebs TID PRN. Consider inhaled steroid nebulizer.       Relevant Medications   fluticasone (FLONASE) 50 MCG/ACT nasal spray   predniSONE (DELTASONE) 20 MG tablet   ipratropium-albuterol (DUONEB) 0.5-2.5 (3) MG/3ML SOLN   Allergic rhinitis    Continue flonase, claritin      Relevant Medications   fluticasone (FLONASE) 50 MCG/ACT nasal spray   MDD (major depressive disorder), recurrent episode, moderate (HCC)   Relevant Medications   sertraline (ZOLOFT) 100 MG tablet     Meds ordered this encounter  Medications  . fluticasone (FLONASE) 50 MCG/ACT nasal spray    Sig: Place 2 sprays into both nostrils daily.    Dispense:  16 g    Refill:  12  . sertraline (ZOLOFT) 100 MG tablet    Sig: Take 1 tablet (100 mg total) by mouth daily.    Dispense:  90 tablet    Refill:  4  . predniSONE (DELTASONE) 20 MG tablet    Sig: Take 2 tablets (40 mg total) by mouth daily for 5 days.    Dispense:  10 tablet    Refill:  0  . ipratropium-albuterol (DUONEB)  0.5-2.5 (3) MG/3ML nebulizer solution 3 mL  . ipratropium-albuterol (DUONEB) 0.5-2.5 (3) MG/3ML SOLN    Sig: Take 3 mLs by nebulization every 6 (six) hours as needed.    Dispense:  360 mL    Refill:  1    In place of albuterol and spiriva  . doxycycline (VIBRA-TABS) 100 MG tablet    Sig: Take 1 tablet (100 mg total) by mouth 2 (two) times daily.    Dispense:  20 tablet    Refill:  0    No orders of the defined types were placed in this encounter.   Patient Instructions  I will check into ENT appointment.  We will refer you back to Pulmonology  Albuterol /atrovent nebulizer treatment today  Start prednisone steroid burst 40mg  daily for 5 days. Start doxycycline antibiotic as well.  May use duonebs at home - sent to pharmacy in place of plain albuterol nebulizer and spiriva inhaler. If you stop duonebs, may restart spiriva.  If interested, check with pharmacy about new 2 shot shingles series (shingrix) and RSV shot.  Continue daytime oxygen, start using oxygen at night time.  Good to see you today Return as needed or in 3-4 months for follow up visit  Follow up plan: Return in about 3 months (around 11/15/2023) for follow up visit.  Eustaquio Boyden, MD

## 2023-08-17 NOTE — Assessment & Plan Note (Signed)
 Continue flonase, claritin

## 2023-08-18 ENCOUNTER — Telehealth: Payer: Self-pay | Admitting: Family Medicine

## 2023-08-18 ENCOUNTER — Ambulatory Visit: Admitting: Dermatology

## 2023-08-18 ENCOUNTER — Encounter: Payer: Self-pay | Admitting: Dermatology

## 2023-08-18 DIAGNOSIS — L570 Actinic keratosis: Secondary | ICD-10-CM | POA: Diagnosis not present

## 2023-08-18 DIAGNOSIS — D492 Neoplasm of unspecified behavior of bone, soft tissue, and skin: Secondary | ICD-10-CM

## 2023-08-18 DIAGNOSIS — H61002 Unspecified perichondritis of left external ear: Secondary | ICD-10-CM

## 2023-08-18 DIAGNOSIS — R238 Other skin changes: Secondary | ICD-10-CM

## 2023-08-18 DIAGNOSIS — L578 Other skin changes due to chronic exposure to nonionizing radiation: Secondary | ICD-10-CM

## 2023-08-18 DIAGNOSIS — I878 Other specified disorders of veins: Secondary | ICD-10-CM | POA: Diagnosis not present

## 2023-08-18 DIAGNOSIS — Z7189 Other specified counseling: Secondary | ICD-10-CM | POA: Diagnosis not present

## 2023-08-18 DIAGNOSIS — D489 Neoplasm of uncertain behavior, unspecified: Secondary | ICD-10-CM

## 2023-08-18 DIAGNOSIS — C44712 Basal cell carcinoma of skin of right lower limb, including hip: Secondary | ICD-10-CM | POA: Diagnosis not present

## 2023-08-18 DIAGNOSIS — I7121 Aneurysm of the ascending aorta, without rupture: Secondary | ICD-10-CM | POA: Insufficient documentation

## 2023-08-18 DIAGNOSIS — W908XXA Exposure to other nonionizing radiation, initial encounter: Secondary | ICD-10-CM

## 2023-08-18 DIAGNOSIS — H6123 Impacted cerumen, bilateral: Secondary | ICD-10-CM

## 2023-08-18 DIAGNOSIS — C4491 Basal cell carcinoma of skin, unspecified: Secondary | ICD-10-CM

## 2023-08-18 HISTORY — DX: Basal cell carcinoma of skin, unspecified: C44.91

## 2023-08-18 MED ORDER — MUPIROCIN 2 % EX OINT
TOPICAL_OINTMENT | CUTANEOUS | 0 refills | Status: DC
Start: 2023-08-18 — End: 2023-12-29

## 2023-08-18 NOTE — Assessment & Plan Note (Signed)
 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

## 2023-08-18 NOTE — Patient Instructions (Addendum)
 Electrodesiccation and Curettage ("Scrape and Burn") Wound Care Instructions  Leave the original bandage on for 24 hours if possible.  If the bandage becomes soaked or soiled before that time, it is OK to remove it and examine the wound.  A small amount of post-operative bleeding is normal.  If excessive bleeding occurs, remove the bandage, place gauze over the site and apply continuous pressure (no peeking) over the area for 30 minutes. If this does not work, please call our clinic as soon as possible or page your doctor if it is after hours.   Once a day, cleanse the wound with soap and water. It is fine to shower. If a thick crust develops you may use a Q-tip dipped into dilute hydrogen peroxide (mix 1:1 with water) to dissolve it.  Hydrogen peroxide can slow the healing process, so use it only as needed.    After washing, apply petroleum jelly (Vaseline) or an antibiotic ointment if your doctor prescribed one for you, followed by a bandage.    For best healing, the wound should be covered with a layer of ointment at all times. If you are not able to keep the area covered with a bandage to hold the ointment in place, this may mean re-applying the ointment several times a day.  Continue this wound care until the wound has healed and is no longer open. It may take several weeks for the wound to heal and close.  Itching and mild discomfort is normal during the healing process.  If you have any discomfort, you can take Tylenol (acetaminophen) or ibuprofen as directed on the bottle. (Please do not take these if you have an allergy to them or cannot take them for another reason).  Some redness, tenderness and white or yellow material in the wound is normal healing.  If the area becomes very sore and red, or develops a thick yellow-green material (pus), it may be infected; please notify us.    Wound healing continues for up to one year following surgery. It is not unusual to experience pain in the scar  from time to time during the interval.  If the pain becomes severe or the scar thickens, you should notify the office.    A slight amount of redness in a scar is expected for the first six months.  After six months, the redness will fade and the scar will soften and fade.  The color difference becomes less noticeable with time.  If there are any problems, return for a post-op surgery check at your earliest convenience.  To improve the appearance of the scar, you can use silicone scar gel, cream, or sheets (such as Mederma or Serica) every night for up to one year. These are available over the counter (without a prescription).  Please call our office at [phone] for any questions or concerns.    Biopsy Wound Care Instructions  Leave the original bandage on for 24 hours if possible.  If the bandage becomes soaked or soiled before that time, it is OK to remove it and examine the wound.  A small amount of post-operative bleeding is normal.  If excessive bleeding occurs, remove the bandage, place gauze over the site and apply continuous pressure (no peeking) over the area for 30 minutes. If this does not work, please call our clinic as soon as possible or page your doctor if it is after hours.   Once a day, cleanse the wound with soap and water. It is fine to  shower. If a thick crust develops you may use a Q-tip dipped into dilute hydrogen peroxide (mix 1:1 with water) to dissolve it.  Hydrogen peroxide can slow the healing process, so use it only as needed.    After washing, apply petroleum jelly (Vaseline) or an antibiotic ointment if your doctor prescribed one for you, followed by a bandage.    For best healing, the wound should be covered with a layer of ointment at all times. If you are not able to keep the area covered with a bandage to hold the ointment in place, this may mean re-applying the ointment several times a day.  Continue this wound care until the wound has healed and is no longer  open.   Itching and mild discomfort is normal during the healing process. However, if you develop pain or severe itching, please call our office.   If you have any discomfort, you can take Tylenol (acetaminophen) or ibuprofen as directed on the bottle. (Please do not take these if you have an allergy to them or cannot take them for another reason).  Some redness, tenderness and white or yellow material in the wound is normal healing.  If the area becomes very sore and red, or develops a thick yellow-green material (pus), it may be infected; please notify us.    If you have stitches, return to clinic as directed to have the stitches removed. You will continue wound care for 2-3 days after the stitches are removed.   Wound healing continues for up to one year following surgery. It is not unusual to experience pain in the scar from time to time during the interval.  If the pain becomes severe or the scar thickens, you should notify the office.    A slight amount of redness in a scar is expected for the first six months.  After six months, the redness will fade and the scar will soften and fade.  The color difference becomes less noticeable with time.  If there are any problems, return for a post-op surgery check at your earliest convenience.  To improve the appearance of the scar, you can use silicone scar gel, cream, or sheets (such as Mederma or Serica) every night for up to one year. These are available over the counter (without a prescription).  Please call our office at [phone] for any questions or concerns.    Cryotherapy Aftercare  Wash gently with soap and water everyday.   Apply Vaseline and Band-Aid daily until healed.    Due to recent changes in healthcare laws, you may see results of your pathology and/or laboratory studies on MyChart before the doctors have had a chance to review them. We understand that in some cases there may be results that are confusing or concerning to  you. Please understand that not all results are received at the same time and often the doctors may need to interpret multiple results in order to provide you with the best plan of care or course of treatment. Therefore, we ask that you please give [place] 2 business days to thoroughly review all your results before contacting the office for clarification. Should we see a critical lab result, you will be contacted sooner.   If You Need Anything After Your Visit  If you have any questions or concerns for your doctor, please call our main line at [phone] and press option 4 to reach your doctor's medical assistant. If no one answers, please leave a voicemail as directed and we  will return your call as soon as possible. Messages left after 4 pm will be answered the following business day.   You may also send [place] a message via MyChart. We typically respond to MyChart messages within 1-2 business days.  For prescription refills, please ask your pharmacy to contact our office. Our fax number is [phone].  If you have an urgent issue when the clinic is closed that cannot wait until the next business day, you can page your doctor at the number below.    Please note that while we do our best to be available for urgent issues outside of office hours, we are not available 24/7.   If you have an urgent issue and are unable to reach [place], you may choose to seek medical care at your doctor's office, retail clinic, urgent care center, or emergency room.  If you have a medical emergency, please immediately call 911 or go to the emergency department.  Pager Numbers  - Dr. Gwen Pounds: [phone]  - Dr. Roseanne Reno: [phone]  - Dr. Katrinka Blazing: [phone]   In the event of inclement weather, please call our main line at [phone] for an update on the status of any delays or closures.  Dermatology Medication Tips: Please keep the boxes that topical medications come in in order to help keep track of the  instructions about where and how to use these. Pharmacies typically print the medication instructions only on the boxes and not directly on the medication tubes.   If your medication is too expensive, please contact our office at [phone] option 4 or send [place] a message through MyChart.   We are unable to tell what your co-pay for medications will be in advance as this is different depending on your insurance coverage. However, we may be able to find a substitute medication at lower cost or fill out paperwork to get insurance to cover a needed medication.   If a prior authorization is required to get your medication covered by your insurance company, please allow [place] 1-2 business days to complete this process.  Drug prices often vary depending on where the prescription is filled and some pharmacies may offer cheaper prices.  The website www.goodrx.com contains coupons for medications through different pharmacies. The prices here do not account for what the cost may be with help from insurance (it may be cheaper with your insurance), but the website can give you the price if you did not use any insurance.  - You can print the associated coupon and take it with your prescription to the pharmacy.  - You may also stop by our office during regular business hours and pick up a GoodRx coupon card.  - If you need your prescription sent electronically to a different pharmacy, notify our office through [facility] or by phone at [phone] option 4.     Si Usted Necesita Algo Despus de Su Visita  Tambin puede enviarnos un mensaje a travs de Clinical cytogeneticist. Por lo general respondemos a los mensajes de MyChart en el transcurso de 1 a 2 das hbiles.  Para renovar recetas, por favor pida a su farmacia que se ponga en contacto con nuestra oficina. Annie Sable de fax es [place] [phone].  Si tiene un asunto urgente cuando la clnica est cerrada y que no puede esperar hasta el siguiente da hbil,  puede llamar/localizar a su doctor(a) al nmero que aparece a continuacin.   Por favor, tenga en cuenta que aunque hacemos todo lo posible para estar disponibles  para asuntos urgentes fuera del horario de [place], no estamos disponibles las 24 horas del da, los 7 [address]  [address] de la [place].   Si tiene un problema urgente y no puede comunicarse con nosotros, puede optar por buscar atencin mdica  en el consultorio de su doctor(a), en una clnica privada, en un centro de atencin urgente o en una sala de emergencias.  Si tiene Engineer, drilling, por favor llame inmediatamente al 911 o vaya a la sala de emergencias.  Nmeros de bper  - Dr. Gwen Pounds: [phone]  - Dra. Roseanne Reno: [phone]  - Dr. Katrinka Blazing: [phone]   En caso de inclemencias del tiempo, por favor llame a Lacy Duverney principal al [phone] para una actualizacin sobre el [place] de cualquier retraso o cierre.  Consejos para la medicacin en dermatologa: Por favor, guarde las cajas en las que vienen los medicamentos de uso tpico para ayudarle a seguir las instrucciones sobre dnde y cmo usarlos. Las farmacias generalmente imprimen las instrucciones del medicamento slo en las cajas y no directamente en los tubos del [place].   Si su medicamento es muy caro, por favor, pngase en contacto con Rolm Gala llamando al [phone] y presione la opcin 4 o envenos un mensaje a travs de Clinical cytogeneticist.   No podemos decirle cul ser su copago por los medicamentos por adelantado ya que esto es diferente dependiendo de la cobertura de su seguro. Sin embargo, es posible que podamos encontrar un medicamento sustituto a Audiological scientist un formulario para que el seguro cubra el medicamento que se considera necesario.   Si se requiere una autorizacin previa para que su compaa de seguros [place] su medicamento, por favor permtanos de 1 a 2 das hbiles para completar [address]  Los precios de los medicamentos varan  con frecuencia dependiendo del Environmental consultant de dnde se surte la receta y alguna farmacias pueden ofrecer precios ms baratos.  El sitio web www.goodrx.com tiene cupones para medicamentos de Health and safety inspector. Los precios aqu no tienen en cuenta lo que podra costar con la ayuda del seguro (puede ser ms barato con su seguro), pero el sitio web puede darle el precio si no utiliz Tourist information centre manager.  - Puede imprimir el cupn correspondiente y llevarlo con su receta a la farmacia.  - Tambin puede pasar por nuestra oficina durante el horario de atencin regular y Education officer, museum una tarjeta de cupones de GoodRx.  - Si necesita que su receta se enve electrnicamente a una farmacia diferente, informe a nuestra oficina a travs de MyChart de [place] o por telfono llamando al [phone] y presione la opcin 4.

## 2023-08-18 NOTE — Assessment & Plan Note (Signed)
 Chronic, stable on current regimen - continue.

## 2023-08-18 NOTE — Assessment & Plan Note (Signed)
 Continue aspirin, plavix, zetia, crestor.

## 2023-08-18 NOTE — Assessment & Plan Note (Signed)
 4cm by CTA chest 05/2023 - consider yearly monitoring

## 2023-08-18 NOTE — Progress Notes (Unsigned)
 New Patient Visit   Subjective  Nancy Merritt is a 83 y.o. female who presents for the following: spot at left earlobe that bothers her when she sleeps reports has been there over a year. Spot at right lower leg that she has had over 3 years.   The patient has spots, moles and lesions to be evaluated, some may be new or changing and the patient may have concern these could be cancer.  The following portions of the chart were reviewed this encounter and updated as appropriate: medications, allergies, medical history  Review of Systems:  No other skin or systemic complaints except as noted in HPI or Assessment and Plan.  Objective  Well appearing patient in no apparent distress; mood and affect are within normal limits.  A focused examination was performed of the following areas: Right leg, left ear   Relevant exam findings are noted in the Assessment and Plan.     right medial pretibial 2.2 cm red crusted plaque   left ear superior helix x 1 See photo   Assessment & Plan   ACTINIC DAMAGE - chronic, secondary to cumulative UV radiation exposure/sun exposure over time - diffuse scaly erythematous macules with underlying dyspigmentation - Recommend daily broad spectrum sunscreen SPF 30+ to sun-exposed areas, reapply every 2 hours as needed.  - Recommend staying in the shade or wearing long sleeves, sun glasses (UVA+UVB protection) and wide brim hats (4-inch brim around the entire circumference of the hat). - Call for new or changing lesions.  [place] Exam: red or purple papule at Left antihelix  See picture  Treatment Plan: Benign-appearing. Observe   NEOPLASM OF UNCERTAIN BEHAVIOR right medial pretibial Epidermal / dermal shaving  Lesion diameter (cm):  2.2 Informed consent: discussed and consent obtained   Timeout: patient name, date of birth, surgical site, and procedure verified   Procedure prep:  Patient was prepped and draped in usual sterile  fashion Prep type:  Isopropyl alcohol Anesthesia: the lesion was anesthetized in a standard fashion   Anesthetic:  1% lidocaine w/ epinephrine 1-100,000 buffered w/ 8.4% NaHCO3 Instrument used: flexible razor blade   Hemostasis achieved with: pressure, aluminum chloride and electrodesiccation   Outcome: patient tolerated procedure well   Post-procedure details: sterile dressing applied and wound care instructions given   Dressing type: bandage and petrolatum    Destruction of lesion Complexity: extensive   Destruction method: electrodesiccation and curettage   Informed consent: discussed and consent obtained   Timeout:  patient name, date of birth, surgical site, and procedure verified Procedure prep:  Patient was prepped and draped in usual sterile fashion Prep type:  Isopropyl alcohol Anesthesia: the lesion was anesthetized in a standard fashion   Anesthetic:  1% lidocaine w/ epinephrine 1-100,000 buffered w/ 8.4% NaHCO3 Curettage performed in three different directions: Yes   Electrodesiccation performed over the curetted area: Yes   Lesion length (cm):  2.2 Lesion width (cm):  2.2 Margin per side (cm):  0.2 Final wound size (cm):  2.6 Hemostasis achieved with:  pressure, aluminum chloride and electrodesiccation Outcome: patient tolerated procedure well with no complications   Post-procedure details: sterile dressing applied and wound care instructions given   Dressing type: bandage and petrolatum   Specimen 1 - Surgical pathology Differential Diagnosis: r/o BCC ED&C today   Check Margins: No R/o bcc   ED&C today    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  IAsher Muir, CMA, am acting as scribe for Armida Sans, MD.   Documentation: I have reviewed the above documentation for accuracy and completeness, and I agree with the above.  Armida Sans, MD

## 2023-08-18 NOTE — Assessment & Plan Note (Signed)
 Referral to ENT for ear cerumen removal placed 01/2023 last year, still has not been scheduled. I will send another message to referral coordinators to help schedule this.

## 2023-08-18 NOTE — Assessment & Plan Note (Addendum)
 Appreciate heme/onc care.  Continue hydroxyurea 1500mg  daily.

## 2023-08-18 NOTE — Assessment & Plan Note (Addendum)
 Chronic, seems euvolemic off diuretic.  Appreciate cardiology care.  Continues carvedilol and entresto.

## 2023-08-18 NOTE — Assessment & Plan Note (Signed)
 Has declined repeat mammogram.

## 2023-08-18 NOTE — Assessment & Plan Note (Addendum)
 PTH again elevated.  High normal calcium levels No h/o kidney stones, osteoporosis.  Doubt she would be surgical candidate.  Will continue to monitor levels.  She may consider DEXA for further evaluation - will further discuss at follow up visit.Marland Kitchen

## 2023-08-18 NOTE — Telephone Encounter (Signed)
 Can we check on status of EMT referral again?  Placed 01/2023.

## 2023-08-18 NOTE — Assessment & Plan Note (Signed)
 Reviewed limiting added sugar in diet.

## 2023-08-19 NOTE — Telephone Encounter (Signed)
 This referral was addressed in Sept 2024 and was re-routed and re-faxed 02/2023.   Given time frame that has elapsed (7 months) and the patient not being contacted a new referral might be needed.

## 2023-08-20 ENCOUNTER — Encounter: Payer: Self-pay | Admitting: *Deleted

## 2023-08-20 ENCOUNTER — Telehealth: Payer: Self-pay

## 2023-08-20 LAB — SURGICAL PATHOLOGY

## 2023-08-20 NOTE — Telephone Encounter (Signed)
 New ENT order placed

## 2023-08-20 NOTE — Telephone Encounter (Signed)
 Nancy Merritt (DPR signed ) some concern about elevated BP last couple of days. 08/19/23 BP 158/90 P 93 and 08/20/23 BP157/98 P 94. Pt has no CP, H/A dizziness. Pt was seen on 08/15/23 and was given prednisone which pt started taking prednisone 20 mg 2 tabs for 5 days on 08/16/23. Explained to Nancy Merritt that prednisone can possilby cause some elevation in BP Pt has had difficulty breathing and lightheadedness for few wks and nebulizer seems to be helping the breathing. Pt has no swelling in legs or abd. Pt has not missed taking any meds.Nancy Merritt request cb after reviewed by Dr Reece Agar. Sending note to Dr Reece Agar and G pool and will teams [place] CMA.

## 2023-08-20 NOTE — Addendum Note (Signed)
 Addended by: Eustaquio Boyden on: 08/20/2023 09:26 AM   Modules accepted: Orders

## 2023-08-20 NOTE — Telephone Encounter (Signed)
 BP in office was ok. Agree likely prednisone effect - to continue monitoring BP at home, limit salt/sodium in diet, increase water by 1-2 glasses/day, to let us know if persistently elevated BP readings after finishing prednisone.  To let us know if respiratory status not improving with treatment (prednisone/doxycycline)

## 2023-08-20 NOTE — Telephone Encounter (Signed)
 Okay, I will get this sent over to ENT now and contact the patient.

## 2023-08-21 ENCOUNTER — Telehealth: Payer: Self-pay

## 2023-08-21 ENCOUNTER — Encounter: Payer: Self-pay | Admitting: Dermatology

## 2023-08-21 NOTE — Telephone Encounter (Signed)
-----   Message from Armida Sans sent at 08/21/2023  8:57 AM EDT ----- FINAL DIAGNOSIS        1. Skin (M), right medial pretibial :       BASAL CELL CARCINOMA, NODULAR AND INFILTRATIVE PATTERNS   Cancer = BCC As suspected Already treated Re-check next visit

## 2023-08-21 NOTE — Telephone Encounter (Signed)
 Left pt msg to call for bx result/sh

## 2023-08-21 NOTE — Telephone Encounter (Signed)
Lvm asking pt/pt's daughter, [name] (on dpr), to call back. Need to relay Dr. Synthia Innocent message.

## 2023-08-22 NOTE — Telephone Encounter (Signed)
 Per Michela Pitcher, pt's daughter, Dorene Grebe rtn call.   Spoke with pt/pt's daughter, Dorene Grebe relaying Dr Timoteo Expose message. They verbalized understanding.

## 2023-08-22 NOTE — Telephone Encounter (Signed)
Lvm asking pt/pt's daughter, [name] (on dpr), to call back. Need to relay Dr. Synthia Innocent message.

## 2023-08-22 NOTE — Telephone Encounter (Signed)
 Copied from CRM [id number]. Topic: General - Other >> Aug 22, 2023 11:02 AM Fuller Mandril wrote: Reason for CRM: Patient daughter returned missed call. Called [facility]. Advised ok to provide message from provider. Read note as written from provider. Caller under stood. No further question at this time. Thank You

## 2023-08-26 ENCOUNTER — Telehealth: Payer: Self-pay

## 2023-08-26 ENCOUNTER — Ambulatory Visit: Payer: Self-pay

## 2023-08-26 NOTE — Telephone Encounter (Signed)
-----   Message from Armida Sans sent at 08/21/2023  8:57 AM EDT ----- FINAL DIAGNOSIS        1. Skin (M), right medial pretibial :       BASAL CELL CARCINOMA, NODULAR AND INFILTRATIVE PATTERNS   Cancer = BCC As suspected Already treated Re-check next visit

## 2023-08-26 NOTE — Telephone Encounter (Signed)
 Patient informed of pathology results

## 2023-08-26 NOTE — Telephone Encounter (Addendum)
 Chief Complaint: Shortness of breath Symptoms: wheezing, shortness of breath Frequency: couple of weeks Pertinent Negatives: Patient denies fever, CP Disposition: [] ED /[] Urgent Care (no appt availability in office) / [] Appointment(In office/virtual)/ []  [place] [facility]/ [] Home Care/ [x] Refused Recommended Disposition /[] [place] [facility]/ []  Follow-up with PCP Additional Notes: Daughter Dorene Grebe calling for patient. Mina isn't with her mom as she is calling. Dorene Grebe states patient has been having "severe" shortness of breath with any sort of exertion or movement. Endorses wheezing along with shortness of breath and cough. States symptoms have been going on for a couple of weeks and have increased in severity. Mina states patient's pulse ox is checked daily and has been above 92%-reports patient will put her oxygen on if she goes below 92%. Unable to say what her current pulse ox is as Armed forces technical officer isn't with patient. Per protocol, patient is recommended to the ED. Daughter refused ED recommendation. Daughter is wanting to avoid ED and wants to see if PCP can get patient directly admitted. Daughter is asking for PCP to call her [phone]. Daughter verbalized understanding and all questions answered at this time.    Copied from CRM [id number]. Topic: Clinical - Red Word Triage >> Aug 26, 2023 10:17 AM Izetta Dakin wrote: [facility] that prompted transfer to Nurse Triage: SOB Reason for Disposition  [1] MODERATE difficulty breathing (e.g., speaks in phrases, SOB even at rest, pulse 100-120) AND [2] NEW-onset or WORSE than normal  Answer Assessment - Initial Assessment Questions 1. RESPIRATORY STATUS: "Describe your breathing?" (e.g., wheezing, shortness of breath, unable to speak, severe coughing)      Shortness of breath-unable to walk to the bathroom without shortness of breath 2. ONSET: "When did this breathing problem begin?"      Started a couple of weeks 3. PATTERN "Does the difficult breathing  come and go, or has it been constant since it started?"      constant 4. SEVERITY: "How bad is your breathing?" (e.g., mild, moderate, severe)    - MILD: No SOB at rest, mild SOB with walking, speaks normally in sentences, can lie down, no retractions, pulse < 100.    - MODERATE: SOB at rest, SOB with minimal exertion and prefers to sit, cannot lie down flat, speaks in phrases, mild retractions, audible wheezing, pulse 100-120.    - SEVERE: Very SOB at rest, speaks in single words, struggling to breathe, sitting hunched forward, retractions, pulse > 120      severe 5. RECURRENT SYMPTOM: "Have you had difficulty breathing before?" If Yes, ask: "When was the last time?" and "What happened that time?"      Yes-started a couple weeks ago 6. CARDIAC HISTORY: "Do you have any history of heart disease?" (e.g., heart attack, angina, bypass surgery, angioplasty)      Bypass Sx,  7. LUNG HISTORY: "Do you have any history of lung disease?"  (e.g., pulmonary embolus, asthma, emphysema)     COPD, asthma 8. CAUSE: "What do you think is causing the breathing problem?"      Similar symptoms 9. OTHER SYMPTOMS: "Do you have any other symptoms? (e.g., dizziness, runny nose, cough, chest pain, fever)     Cough (has allergies) 10. O2 SATURATION MONITOR:  "Do you use an oxygen saturation monitor (pulse oximeter) at home?" If Yes, ask: "What is your reading (oxygen level) today?" "What is your usual oxygen saturation reading?" (e.g., 95%)       Monitored daily-daughter isn't with patient at this time 12. TRAVEL: "  Have you traveled out of the country in the last month?" (e.g., travel history, exposures)       no  Protocols used: Breathing Difficulty-A-AH

## 2023-08-26 NOTE — Telephone Encounter (Signed)
 Called daughter advised we can not admit from office. She will be evaluated at ED.

## 2023-08-27 ENCOUNTER — Emergency Department
Admission: EM | Admit: 2023-08-27 | Discharge: 2023-08-27 | Disposition: A | Discharge status: Home / Self Care | Attending: Emergency Medicine | Admitting: Emergency Medicine

## 2023-08-27 ENCOUNTER — Emergency Department

## 2023-08-27 ENCOUNTER — Other Ambulatory Visit: Payer: Self-pay

## 2023-08-27 DIAGNOSIS — N179 Acute kidney failure, unspecified: Secondary | ICD-10-CM | POA: Diagnosis not present

## 2023-08-27 DIAGNOSIS — J449 Chronic obstructive pulmonary disease, unspecified: Secondary | ICD-10-CM | POA: Insufficient documentation

## 2023-08-27 DIAGNOSIS — R059 Cough, unspecified: Secondary | ICD-10-CM | POA: Diagnosis not present

## 2023-08-27 DIAGNOSIS — I5030 Unspecified diastolic (congestive) heart failure: Secondary | ICD-10-CM | POA: Diagnosis not present

## 2023-08-27 DIAGNOSIS — I700 Atherosclerosis of aorta: Secondary | ICD-10-CM | POA: Diagnosis not present

## 2023-08-27 DIAGNOSIS — J441 Chronic obstructive pulmonary disease with (acute) exacerbation: Secondary | ICD-10-CM | POA: Diagnosis not present

## 2023-08-27 DIAGNOSIS — I110 Hypertensive heart disease with heart failure: Secondary | ICD-10-CM | POA: Diagnosis not present

## 2023-08-27 DIAGNOSIS — R0602 Shortness of breath: Secondary | ICD-10-CM | POA: Diagnosis present

## 2023-08-27 LAB — COMPREHENSIVE METABOLIC PANEL WITH GFR
ALT: 15 U/L (ref 0–44)
AST: 18 U/L (ref 15–41)
Albumin: 3.3 g/dL — ABNORMAL LOW (ref 3.5–5.0)
Alkaline Phosphatase: 79 U/L (ref 38–126)
Anion gap: 7 (ref 5–15)
BUN: 19 mg/dL (ref 8–23)
CO2: 26 mmol/L (ref 22–32)
Calcium: 9.7 mg/dL (ref 8.9–10.3)
Chloride: 105 mmol/L (ref 98–111)
Creatinine, Ser: 1.16 mg/dL — ABNORMAL HIGH (ref 0.44–1.00)
GFR, Estimated: 46 mL/min — ABNORMAL LOW (ref >60)
Glucose, Bld: 127 mg/dL — ABNORMAL HIGH (ref 70–99)
Potassium: 3.9 mmol/L (ref 3.5–5.1)
Sodium: 138 mmol/L (ref 135–145)
Total Bilirubin: 0.4 mg/dL (ref 0.0–1.2)
Total Protein: 6.5 g/dL (ref 6.5–8.1)

## 2023-08-27 LAB — CBC WITH DIFFERENTIAL/PLATELET
Abs Immature Granulocytes: 0.03 10*3/uL (ref 0.00–0.07)
Basophils Absolute: 0 10*3/uL (ref 0.0–0.1)
Basophils Relative: 0 %
Eosinophils Absolute: 0.2 10*3/uL (ref 0.0–0.5)
Eosinophils Relative: 2 %
HCT: 40.7 % (ref 36.0–46.0)
Hemoglobin: 13.6 g/dL (ref 12.0–15.0)
Immature Granulocytes: 0 %
Lymphocytes Relative: 23 %
Lymphs Abs: 2 10*3/uL (ref 0.7–4.0)
MCH: 35 pg — ABNORMAL HIGH (ref 26.0–34.0)
MCHC: 33.4 g/dL (ref 30.0–36.0)
MCV: 104.6 fL — ABNORMAL HIGH (ref 80.0–100.0)
Monocytes Absolute: 0.4 10*3/uL (ref 0.1–1.0)
Monocytes Relative: 5 %
Neutro Abs: 6 10*3/uL (ref 1.7–7.7)
Neutrophils Relative %: 70 %
Platelets: 701 10*3/uL — ABNORMAL HIGH (ref 150–400)
RBC: 3.89 MIL/uL (ref 3.87–5.11)
RDW: 14.9 % (ref 11.5–15.5)
WBC: 8.5 10*3/uL (ref 4.0–10.5)
nRBC: 0 % (ref 0.0–0.2)

## 2023-08-27 LAB — D-DIMER, QUANTITATIVE: D-Dimer, Quant: 0.41 ug{FEU}/mL (ref 0.00–0.50)

## 2023-08-27 LAB — BRAIN NATRIURETIC PEPTIDE: B Natriuretic Peptide: 79.4 pg/mL (ref 0.0–100.0)

## 2023-08-27 LAB — TROPONIN I (HIGH SENSITIVITY): Troponin I (High Sensitivity): 6 ng/L (ref <18)

## 2023-08-27 MED ORDER — DEXAMETHASONE 6 MG PO TABS: 3.0000 mg | ORAL_TABLET | Freq: Two times a day (BID) | ORAL | 0 refills | Status: AC

## 2023-08-27 MED ORDER — IPRATROPIUM-ALBUTEROL 0.5-2.5 (3) MG/3ML IN SOLN
3.0000 mL | Freq: Once | RESPIRATORY_TRACT | Status: AC
  Administered 2023-08-27: 3 mL via RESPIRATORY_TRACT
  Filled 2023-08-27: qty 3

## 2023-08-27 NOTE — ED Provider Notes (Signed)
 [facility] Provider Note    Event Date/Time   First MD Initiated Contact with Patient 08/27/23 1043     (approximate)   History   Shortness of Breath   HPI Nancy Merritt is a 83 y.o. female with history of asthma, COPD, HTN, thoracic ascending aortic aneurysm presenting today for shortness of breath.  Patient states since Christmas time she has had intermittent exertional dyspnea.  She denies any shortness of breath when at rest or sitting down.  She has chronic congestion but no change in her cough.  Denies fever, chest pain, nausea, vomiting, leg pain, leg swelling.  She has recently been on oral antibiotics and prednisone which she felt did help the symptoms originally.  Chart review: Prior history of diastolic heart failure not on fluid medication.     Physical Exam   Triage Vital Signs: ED Triage Vitals [08/27/23 1029]  Encounter Vitals Group     BP (!) 146/89     Systolic BP Percentile      Diastolic BP Percentile      Pulse Rate 83     Resp 18     Temp 97.9 F (36.6 C)     Temp Source Oral     SpO2 93 %     Weight 154 lb (69.9 kg)     Height 4\' 11"  (1.499 m)     Head Circumference      Peak Flow      Pain Score 0     Pain Loc      Pain Education      Exclude from Growth Chart     Most recent vital signs: Vitals:   08/27/23 1230 08/27/23 1300  BP: 130/68 (!) 171/83  Pulse: 80 74  Resp:    Temp:    SpO2: 96% 96%   Physical Exam: I have reviewed the vital signs and nursing notes. General: Awake, alert, no acute distress.  Nontoxic appearing. Head:  Atraumatic, normocephalic.   ENT:  EOM intact, PERRL. Oral mucosa is pink and moist with no lesions. Neck: Neck is supple with full range of motion, No meningeal signs. Cardiovascular:  RRR, No murmurs. Peripheral pulses palpable and equal bilaterally. Respiratory:  Symmetrical chest wall expansion.  Mild expiratory wheeze throughout.  Good air movement throughout.  No use of  accessory muscles.   Musculoskeletal:  No cyanosis. Trace pretibial edema bilaterally. Moving extremities with full ROM Abdomen:  Soft, nontender, nondistended. Neuro:  GCS 15, moving all four extremities, interacting appropriately. Speech clear. Psych:  Calm, appropriate.   Skin:  Warm, dry, no rash.    ED Results / Procedures / Treatments   Labs (all labs ordered are listed, but only abnormal results are displayed) Labs Reviewed  CBC WITH DIFFERENTIAL/PLATELET - Abnormal; Notable for the following components:      Result Value   MCV 104.6 (*)    MCH 35.0 (*)    Platelets 701 (*)    All other components within normal limits  COMPREHENSIVE METABOLIC PANEL WITH GFR - Abnormal; Notable for the following components:   Glucose, Bld 127 (*)    Creatinine, Ser 1.16 (*)    Albumin 3.3 (*)    GFR, Estimated 46 (*)    All other components within normal limits  BRAIN NATRIURETIC PEPTIDE  D-DIMER, QUANTITATIVE  TROPONIN I (HIGH SENSITIVITY)     EKG My EKG potation: Rate of 80, normal sinus rhythm, normal axis, normal intervals.  No acute ST elevations or  depressions   RADIOLOGY Independently interpreted chest x-ray with no acute pathology   PROCEDURES:  Critical Care performed: No  Procedures   MEDICATIONS ORDERED IN ED: Medications  ipratropium-albuterol (DUONEB) 0.5-2.5 (3) MG/3ML nebulizer solution 3 mL (3 mLs Nebulization Given 08/27/23 1131)     IMPRESSION / MDM / ASSESSMENT AND PLAN / ED COURSE  I reviewed the triage vital signs and the nursing notes.                              Differential diagnosis includes, but is not limited to, COPD exacerbation, uncontrolled COPD, fluid overload secondary to heart failure, pneumonia, lower suspicion ACS or PE  Patient's presentation is most consistent with acute complicated illness / injury requiring diagnostic workup.  Patient is an 83 year old female presenting today for 4 months worth of exertional dyspnea.  No  specific change today but has reportedly not have improvement with PCP so they referred her to the ED.  On arrival she is seems in no acute distress with no tachypnea.  Not hypoxic on room air.  Trace expiratory wheezing noted throughout but no significant obvious COPD exacerbation.  Will trial DuoNeb to see if there is any improvement.  Troponin negative, D-dimer negative, BNP negative.  Suspect symptoms are more likely related to chronic COPD with no acute worsening today.  Does not require admission at this time for further care she has no hypoxia or tachypnea.  Will discharge on Decadron as she reports having hypertension related to prednisone in the past.  She has follow-up with pulmonology in less than 3 weeks.  Instructed on strict return precautions for severe worsening symptoms.  The patient is on the cardiac monitor to evaluate for evidence of arrhythmia and/or significant heart rate changes. Clinical Course as of 08/27/23 1329  Wed Aug 27, 2023  1125 Creatinine(!): 1.16 Slight AKI comparison to baseline but not significantly elevated [DW]    Clinical Course User Index [DW] Janith Lima, MD     FINAL CLINICAL IMPRESSION(S) / ED DIAGNOSES   Final diagnoses:  Chronic obstructive pulmonary disease, unspecified COPD type (HCC)     Rx / DC Orders   ED Discharge Orders          Ordered    dexamethasone (DECADRON) 6 MG tablet  2 times daily        08/27/23 1328             Note:  This document was prepared using Dragon voice recognition software and may include unintentional dictation errors.   Janith Lima, MD 08/27/23 1330

## 2023-08-27 NOTE — Discharge Instructions (Signed)
 I have sent a steroid to your pharmacy for you to take as prescribed until you follow-up with the pulmonology team.  Please return for any acute severe worsening of her shortness of breath.

## 2023-08-27 NOTE — ED Triage Notes (Signed)
 Pt c/o exertional dyspnea for months. Pt has been following with pcp and has received nebulizers and antibiotics without improvement. Pt reports intermittent swelling in her legs. Pt denies orthopnea. Pt endorses dry cough x2 weeks.

## 2023-09-10 DIAGNOSIS — H6123 Impacted cerumen, bilateral: Secondary | ICD-10-CM | POA: Diagnosis not present

## 2023-09-16 ENCOUNTER — Ambulatory Visit: Admitting: Internal Medicine

## 2023-09-16 ENCOUNTER — Encounter: Payer: Self-pay | Admitting: Internal Medicine

## 2023-09-16 VITALS — BP 120/80 | HR 61 | Temp 97.8°F | Ht 60.0 in | Wt 155.2 lb

## 2023-09-16 DIAGNOSIS — J9611 Chronic respiratory failure with hypoxia: Secondary | ICD-10-CM

## 2023-09-16 DIAGNOSIS — J449 Chronic obstructive pulmonary disease, unspecified: Secondary | ICD-10-CM

## 2023-09-16 DIAGNOSIS — R0602 Shortness of breath: Secondary | ICD-10-CM

## 2023-09-16 DIAGNOSIS — Z87891 Personal history of nicotine dependence: Secondary | ICD-10-CM | POA: Diagnosis not present

## 2023-09-16 MED ORDER — PREDNISONE 20 MG PO TABS: 20.0000 mg | ORAL_TABLET | Freq: Every day | ORAL | 7 refills | Status: DC

## 2023-09-16 NOTE — Progress Notes (Signed)
 [facility] Pulmonary Medicine Consultation      Date: 09/16/2023,   MRN# [id number] Nancy Merritt [birth year]     CHIEF COMPLAINT:   Assessment of shortness of breath with exertion   HISTORY OF PRESENT ILLNESS   83 year old pleasant white female uses a cane for walking here to assess for her progressive shortness of breath over the last 2 months Current inhaler therapy includes albuterol  inhaler as needed and BREO inhaler  Ambulatory pulse ox shows progressive dyspnea exertion with hypoxia in the office at 83%, patient will be placed on oxygen therapy and will be prescribed oxygen  Based on previous pulmonary function testing patient has moderate obstructive pulmonary disease  Patient had several exacerbations since December Had several rounds of antibiotics and prednisone  Patient with wheezing and shortness of breath and coughing Patient now with hypoxic respiratory failure Patient placed on oxygen therapy Oxygen levels improved with oxygen therapy  No exacerbation at this time No evidence of heart failure at this time No evidence or signs of infection at this time No respiratory distress No fevers, chills, nausea, vomiting, diarrhea No evidence of lower extremity edema No evidence hemoptysis    Pulmonary function testing 2017 Postbronchodilator FEV1 FVC ratio was 63% predicted FEV1 was 73% predicted Positive bronchodilator response at 21% FVC is 86% predicted TLC was 98% predicted RV 107% predicted Expiratory limb on flow volume loops show significant obstructive pattern Findings are concerning for moderate obstructive lung disease    Chest x-ray August 27, 2023 Independently reviewed by me today 09/16/2023 No evidence of pneumonia No evidence of edema No effusions Consider hyperexpanded lung possible flattened diaphragms   PAST MEDICAL HISTORY   Past Medical History:  Diagnosis Date   Arthritis    Asthma    Basal cell carcinoma 08/18/2023   right  medial pretibial, EDC   Cancer (HCC) 1990   COVID-19 06/14/2020   Depression    Hx of breast cancer 04/07/2018   1990. No screening in last 25 years     SURGICAL HISTORY   Past Surgical History:  Procedure Laterality Date   BREAST SURGERY Left 1990   CORONARY ARTERY BYPASS GRAFT     JOINT REPLACEMENT Right    Hip   LEFT HEART CATH AND CORS/GRAFTS ANGIOGRAPHY N/A 06/05/2022   Procedure: LEFT HEART CATH AND CORS/GRAFTS ANGIOGRAPHY;  Surgeon: Wenona Hamilton, MD;  Location: [facility] INVASIVE CV LAB;  Service: Cardiovascular;  Laterality: N/A;   OVARIAN CYST REMOVAL  1970   TUBAL LIGATION  1970     FAMILY HISTORY   Family History  Problem Relation Age of Onset   Cancer Father        lung   Heart attack Father 48   Alcohol abuse Father    Arthritis Father    COPD Father    Diabetes Father    Hypertension Father    Breast cancer Sister 55   Arthritis Sister    Cancer Sister    COPD Sister    Drug abuse Sister    Hypertension Sister    Miscarriages / [place] Sister    Thyroid  disease Sister    Arthritis Mother    COPD Mother    Diabetes Mother    Hearing loss Mother    Hypertension Mother    Miscarriages / [place] Mother    Stroke Mother    Alcohol abuse Brother    Arthritis Brother    Hypertension Brother    Arthritis Daughter    Asthma Daughter  Depression Daughter    Hypertension Daughter    Miscarriages / [place] Daughter    Thyroid  disease Daughter    Arthritis Maternal Grandmother    Hypertension Maternal Grandmother    Stroke Maternal Grandmother    Thyroid  disease Maternal Grandmother    Alcohol abuse Maternal Grandfather    Arthritis Maternal Grandfather    Cancer Maternal Grandfather    Depression Maternal Grandfather    Hearing loss Maternal Grandfather    Hypertension Maternal Grandfather    Arthritis Sister    Thyroid  disease Sister    Thyroid  disease Daughter    Hypertension Daughter    Depression Daughter    COPD Daughter     Alcohol abuse Daughter    Arthritis Daughter    Arthritis Daughter    COPD Daughter    Depression Daughter    Hearing loss Daughter    Hypertension Daughter    Thyroid  disease Daughter      SOCIAL HISTORY   Social History   Tobacco Use   Smoking status: Former    Current packs/day: 0.00    Average packs/day: 1 pack/day for 40.0 years (40.0 ttl pk-yrs)    Types: Cigarettes    Start date: 20    Quit date: 2002    Years since quitting: 23.3   Smokeless tobacco: Never  Vaping Use   Vaping status: Never Used  Substance Use Topics   Alcohol use: Not Currently   Drug use: Never     MEDICATIONS    Home Medication:  Current Outpatient Rx   Order #: [id number] Class: OTC   Order #: [id number] Class: Normal   Order #: [id number] Class: Normal   Order #: [id number] Class: OTC   Order #: [id number] Class: Normal   Order #: [id number] Class: Normal   Order #: [id number] Class: OTC   Order #: [id number] Class: Normal   Order #: [id number] Class: Normal   Order #: [id number] Class: Normal   Order #: [id number] Class: Normal   Order #: [id number] Class: Normal   Order #: [id number] Class: Normal   Order #: [id number] Class: Historical Med   Order #: [id number] Class: Historical Med   Order #: [id number] Class: Historical Med   Order #: [id number] Class: Normal   Order #: [id number] Class: Normal   Order #: [id number] Class: Normal   Order #: [id number] Class: No Print   Order #: [id number] Class: Normal   Order #: [id number] Class: Normal    Current Medication:  Current Outpatient Medications:    acetaminophen  (TYLENOL ) 500 MG tablet, Take 1 tablet (500 mg total) by mouth 3 (three) times daily as needed for moderate pain., Disp: , Rfl:    albuterol  (PROVENTIL ) (2.5 MG/3ML) 0.083% nebulizer solution, TAKE 3 MLS (2.5 MG TOTAL) BY NEBULIZATION EVERY 6 (SIX) HOURS AS NEEDED FOR SHORTNESS OF BREATH., Disp: 150 mL, Rfl: 1   albuterol  (VENTOLIN  HFA) 108 (90 Base) MCG/ACT inhaler, Inhale 2 puffs into the lungs every 4  (four) hours as needed for wheezing or shortness of breath., Disp: 6.7 g, Rfl: 3   aspirin  81 MG chewable tablet, Chew 1 tablet (81 mg total) by mouth daily., Disp: , Rfl:    BREO ELLIPTA  200-25 MCG/ACT AEPB, TAKE 1 PUFF BY MOUTH EVERY DAY, Disp: 180 each, Rfl: 1   carvedilol  (COREG ) 6.25 MG tablet, Take 1 tablet (6.25 mg total) by mouth 2 (two) times daily with a meal., Disp: 60 tablet, Rfl: 11   Cholecalciferol (VITAMIN D3) 25 MCG (1000 UT) capsule, Take 2 capsules (2,000 Units total) by mouth daily., Disp: 30 capsule, Rfl:    clopidogrel  (PLAVIX ) 75  MG tablet, TAKE 1 TABLET BY MOUTH DAILY WITH BREAKFAST., Disp: 90 tablet, Rfl: 2   doxycycline  (VIBRA -TABS) 100 MG tablet, Take 1 tablet (100 mg total) by mouth 2 (two) times daily., Disp: 20 tablet, Rfl: 0   ezetimibe  (ZETIA ) 10 MG tablet, TAKE 1 TABLET BY MOUTH EVERY DAY, Disp: 90 tablet, Rfl: 3   fluticasone  (FLONASE ) 50 MCG/ACT nasal spray, Place 2 sprays into both nostrils daily., Disp: 16 g, Rfl: 12   hydroxyurea  (HYDREA ) 500 MG capsule, Take 3 capsules (1,500 mg total) by mouth daily. May take with food to minimize GI side effects., Disp: 270 capsule, Rfl: 2   ipratropium-albuterol  (DUONEB) 0.5-2.5 (3) MG/3ML SOLN, Take 3 mLs by nebulization every 6 (six) hours as needed., Disp: 360 mL, Rfl: 1   loratadine (CLARITIN) 10 MG tablet, Take 10 mg by mouth daily., Disp: , Rfl:    Multiple Vitamin (MULTIVITAMIN) tablet, Take 1 tablet by mouth daily., Disp: , Rfl:    Multiple Vitamins-Minerals (PRESERVISION AREDS 2) CAPS, Take 2 each by mouth daily at 12 noon., Disp: , Rfl:    mupirocin  ointment (BACTROBAN ) 2 %, Apply topically  wound at right lower leg and cover with bandage daily until healed., Disp: 22 g, Rfl: 0   nitroGLYCERIN  (NITROSTAT ) 0.4 MG SL tablet, Place 1 tablet (0.4 mg total) under the tongue every 5 (five) minutes as needed for chest pain., Disp: 25 tablet, Rfl: 3   rosuvastatin  (CRESTOR ) 20 MG tablet, Take 1 tablet (20 mg total) by mouth  at bedtime., Disp: 90 tablet, Rfl: 3   sacubitril -valsartan  (ENTRESTO ) 24-26 MG, Take 2 tablets by mouth 2 (two) times daily., Disp: , Rfl:    sertraline  (ZOLOFT ) 100 MG tablet, Take 1 tablet (100 mg total) by mouth daily., Disp: 90 tablet, Rfl: 4   Tiotropium Bromide  Monohydrate (SPIRIVA  RESPIMAT) 2.5 MCG/ACT AERS, Inhale 2 puffs into the lungs daily., Disp: 12 g, Rfl: 3    ALLERGIES   Advair diskus [fluticasone -salmeterol], Levofloxacin, Qvar [beclomethasone], Singulair [montelukast], and Symbicort [budesonide-formoterol fumarate]   BP 120/80 (BP Location: Right Arm, Patient Position: Sitting, Cuff Size: Large)   Pulse 61   Temp 97.8 F (36.6 C) (Oral)   Ht 5' (1.524 m)   Wt 155 lb 3.2 oz (70.4 kg)   SpO2 94%   BMI 30.31 kg/m   Review of Systems: Gen:  Denies  fever, sweats, chills weight loss  HEENT: Denies blurred vision, double vision, ear pain, eye pain, hearing loss, nose bleeds, sore throat Cardiac:  No dizziness, chest pain or heaviness, chest tightness,edema, No JVD Resp:   + cough, -sputum production, +shortness of breath,+wheezing, -hemoptysis,  Other:  All other systems negative   Physical Examination:   General Appearance: No distress  EYES PERRLA, EOM intact.   NECK Supple, No JVD Pulmonary: normal breath sounds, +wheezing.  CardiovascularNormal S1,S2.  No m/r/g.   Abdomen: Benign, Soft, non-tender. Neurology UE/LE 5/5 strength, no focal deficits Ext pulses intact, cap refill intact ALL OTHER ROS ARE NEGATIVE        ASSESSMENT/PLAN   83 year old pleasant white female seen today for assessment of COPD based on previous pulmonary function testing patient has moderate obstructive pulmonary disease, in the past several months has had several COPD exacerbations requiring prednisone  and antibiotics. Ambulating pulse oximetry in the office reveals hypoxia down to 80% oxygen patient immediately treated with oxygen therapy  Assessment of COPD Probable  progression of moderate COPD Previous FEV1 was in the 70s Patient currently on Breo and Spiriva   Recommend continuing regimen as prescribed I have explained to them that ristra insufficiency can lead to ineffective traditional inhaler therapy and may need nebulized therapy Patient with ongoing wheezing will prescribe prednisone  therapy Prednisone  20 mg daily for the next 7 days Recommend using DuoNebs every 6 hours while awake as needed Recommend obtaining pulmonary function testing   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 -Patient benefits from oxygen therapy 2L [place]  -recommend using oxygen as prescribed -patient needs this for survival Recommend assessing for nocturnal hypoxia with overnight pulse oximetry   MEDICATION ADJUSTMENTS/LABS AND TESTS ORDERED: Please use oxygen when walking We will test for oxygen levels at nighttime with overnight pulse oximetry Recommend obtaining pulmonary function testing I will prescribe prednisone  20 mg daily for the next 7 days Recommend using DuoNebs nebulizers every 6 hours during the daytime Avoid Allergens and Irritants Avoid secondhand smoke Avoid SICK contacts Recommend  Masking  when appropriate case Recommend Keep up-to-date with vaccinations  CURRENT MEDICATIONS REVIEWED AT LENGTH WITH PATIENT TODAY   Patient  satisfied with Plan of action and management. All questions answered  Follow up 4 weeks  I spent a total of 65 minutes reviewing chart data, face-to-face evaluation with the patient, counseling and coordination of care as detailed above.     Lady Pier, M.D.  [facility]  Medical Director [facility] Medical Director [facility] Cardio-Pulmonary Department

## 2023-09-16 NOTE — Patient Instructions (Addendum)
 Please use oxygen when walking We will test for oxygen levels at nighttime with overnight pulse oximetry Recommend obtaining pulmonary function testing  I will prescribe prednisone  20 mg daily for the next 7 days Recommend using DuoNebs nebulizers every 6 hours during the daytime  Avoid Allergens and Irritants Avoid secondhand smoke Avoid SICK contacts Recommend  Masking  when appropriate case Recommend Keep up-to-date with vaccinations

## 2023-09-18 ENCOUNTER — Inpatient Hospital Stay: Payer: PPO | Attending: Oncology

## 2023-09-18 ENCOUNTER — Inpatient Hospital Stay: Payer: PPO | Admitting: Oncology

## 2023-09-18 ENCOUNTER — Encounter: Payer: Self-pay | Admitting: Oncology

## 2023-09-18 VITALS — BP 153/79 | HR 87 | Temp 97.6°F | Resp 20 | Ht 60.0 in | Wt 156.0 lb

## 2023-09-18 DIAGNOSIS — Z85828 Personal history of other malignant neoplasm of skin: Secondary | ICD-10-CM | POA: Diagnosis not present

## 2023-09-18 DIAGNOSIS — R0602 Shortness of breath: Secondary | ICD-10-CM | POA: Diagnosis not present

## 2023-09-18 DIAGNOSIS — Z79899 Other long term (current) drug therapy: Secondary | ICD-10-CM | POA: Diagnosis not present

## 2023-09-18 DIAGNOSIS — D7589 Other specified diseases of blood and blood-forming organs: Secondary | ICD-10-CM | POA: Insufficient documentation

## 2023-09-18 DIAGNOSIS — I10 Essential (primary) hypertension: Secondary | ICD-10-CM | POA: Diagnosis not present

## 2023-09-18 DIAGNOSIS — D473 Essential (hemorrhagic) thrombocythemia: Secondary | ICD-10-CM

## 2023-09-18 DIAGNOSIS — Z853 Personal history of malignant neoplasm of breast: Secondary | ICD-10-CM | POA: Insufficient documentation

## 2023-09-18 DIAGNOSIS — Z87891 Personal history of nicotine dependence: Secondary | ICD-10-CM | POA: Insufficient documentation

## 2023-09-18 DIAGNOSIS — Z8616 Personal history of COVID-19: Secondary | ICD-10-CM | POA: Insufficient documentation

## 2023-09-18 DIAGNOSIS — Z9981 Dependence on supplemental oxygen: Secondary | ICD-10-CM | POA: Insufficient documentation

## 2023-09-18 LAB — CBC WITH DIFFERENTIAL/PLATELET
Abs Immature Granulocytes: 0.04 10*3/uL (ref 0.00–0.07)
Basophils Absolute: 0 10*3/uL (ref 0.0–0.1)
Basophils Relative: 0 %
Eosinophils Absolute: 0 10*3/uL (ref 0.0–0.5)
Eosinophils Relative: 0 %
HCT: 41 % (ref 36.0–46.0)
Hemoglobin: 13.7 g/dL (ref 12.0–15.0)
Immature Granulocytes: 1 %
Lymphocytes Relative: 10 %
Lymphs Abs: 0.8 10*3/uL (ref 0.7–4.0)
MCH: 34.2 pg — ABNORMAL HIGH (ref 26.0–34.0)
MCHC: 33.4 g/dL (ref 30.0–36.0)
MCV: 102.2 fL — ABNORMAL HIGH (ref 80.0–100.0)
Monocytes Absolute: 0.1 10*3/uL (ref 0.1–1.0)
Monocytes Relative: 1 %
Neutro Abs: 7.4 10*3/uL (ref 1.7–7.7)
Neutrophils Relative %: 88 %
Platelets: 786 10*3/uL — ABNORMAL HIGH (ref 150–400)
RBC: 4.01 MIL/uL (ref 3.87–5.11)
RDW: 14.5 % (ref 11.5–15.5)
WBC: 8.4 10*3/uL (ref 4.0–10.5)
nRBC: 0 % (ref 0.0–0.2)

## 2023-09-18 NOTE — Progress Notes (Signed)
 [facility]  [phone]) [phone] [phone]) [phone]  ID: Nancy Merritt OB: 17-Aug-1940  MR#: [id number]  AOZ#:[id number]  Patient Care Team: Claire Crick, MD as PCP - General (Family Medicine) Sonny Dust, MD (Inactive) as PCP - Cardiology (Cardiology) Quillian Brunt, MD as Consulting Physician (Pulmonary Disease) Jonathan Neighbor, [facility] (Inactive) as Pharmacist (Pharmacist) Shellie Dials, MD as Consulting Physician (Oncology) Arminda Berth, OD (Optometry) Rexene Catching, MD as Consulting Physician (Ophthalmology) Cleve Dale, MD as Consulting Physician (Pulmonary Disease)  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  INTERVAL HISTORY: Patient returns to clinic today for repeat laboratory work, further evaluation, and continuation of Hydrea .  She currently feels well and is asymptomatic.  She has chronic shortness of breath and requires oxygen.  She continues to tolerate 1500 mg Hydrea  without significant side effects.  She does not complain of any weakness or fatigue.  She has no neurologic complaints.  She denies fevers.  She has a good appetite and denies weight loss.  She has no chest pain, cough, or hemoptysis.  She denies any nausea, vomiting, constipation, or diarrhea.  She has no urinary complaints.  Patient offers no further specific complaints today.  REVIEW OF SYSTEMS:   Review of Systems  Constitutional: Negative.  Negative for fever, malaise/fatigue and weight loss.  Respiratory: Negative.  Negative for cough, hemoptysis and shortness of breath.   Cardiovascular: Negative.  Negative for chest pain and leg swelling.  Gastrointestinal: Negative.  Negative for abdominal pain.  Genitourinary: Negative.  Negative for dysuria.  Musculoskeletal: Negative.  Negative for back pain and myalgias.  Skin: Negative.  Negative for rash.  Neurological: Negative.  Negative for dizziness, focal weakness, weakness and  headaches.  Psychiatric/Behavioral: Negative.  The patient is not nervous/anxious.     As per HPI. Otherwise, a complete review of systems is negative.  PAST MEDICAL HISTORY: Past Medical History:  Diagnosis Date   Arthritis    Asthma    Basal cell carcinoma 08/18/2023   right medial pretibial, EDC   Cancer (HCC) 1990   COVID-19 06/14/2020   Depression    Hx of breast cancer 04/07/2018   1990. No screening in last 25 years    PAST SURGICAL HISTORY: Past Surgical History:  Procedure Laterality Date   BREAST SURGERY Left 1990   CORONARY ARTERY BYPASS GRAFT     JOINT REPLACEMENT Right    Hip   LEFT HEART CATH AND CORS/GRAFTS ANGIOGRAPHY N/A 06/05/2022   Procedure: LEFT HEART CATH AND CORS/GRAFTS ANGIOGRAPHY;  Surgeon: Wenona Hamilton, MD;  Location: [facility];  Service: Cardiovascular;  Laterality: N/A;   OVARIAN CYST REMOVAL  1970   TUBAL LIGATION  1970    FAMILY HISTORY: Family History  Problem Relation Age of Onset   Cancer Father        lung   Heart attack Father 68   Alcohol abuse Father    Arthritis Father    COPD Father    Diabetes Father    Hypertension Father    Breast cancer Sister 92   Arthritis Sister    Cancer Sister    COPD Sister    Drug abuse Sister    Hypertension Sister    Miscarriages / [place] Sister    Thyroid  disease Sister    Arthritis Mother    COPD Mother    Diabetes Mother    Hearing loss Mother    Hypertension Mother    Miscarriages / [place] Mother  Stroke Mother    Alcohol abuse Brother    Arthritis Brother    Hypertension Brother    Arthritis Daughter    Asthma Daughter    Depression Daughter    Hypertension Daughter    Miscarriages / [place] Daughter    Thyroid  disease Daughter    Arthritis Maternal Grandmother    Hypertension Maternal Grandmother    Stroke Maternal Grandmother    Thyroid  disease Maternal Grandmother    Alcohol abuse Maternal Grandfather    Arthritis Maternal Grandfather     Cancer Maternal Grandfather    Depression Maternal Grandfather    Hearing loss Maternal Grandfather    Hypertension Maternal Grandfather    Arthritis Sister    Thyroid  disease Sister    Thyroid  disease Daughter    Hypertension Daughter    Depression Daughter    COPD Daughter    Alcohol abuse Daughter    Arthritis Daughter    Arthritis Daughter    COPD Daughter    Depression Daughter    Hearing loss Daughter    Hypertension Daughter    Thyroid  disease Daughter     ADVANCED DIRECTIVES (Y/N):  N  HEALTH MAINTENANCE: Social History   Tobacco Use   Smoking status: Former    Current packs/day: 0.00    Average packs/day: 1 pack/day for 40.0 years (40.0 ttl pk-yrs)    Types: Cigarettes    Start date: 73    Quit date: 2002    Years since quitting: 23.3   Smokeless tobacco: Never  Vaping Use   Vaping status: Never Used  Substance Use Topics   Alcohol use: Not Currently   Drug use: Never     Colonoscopy:  PAP:  Bone density:  Lipid panel:  Allergies  Allergen Reactions   Advair Diskus [Fluticasone -Salmeterol] Shortness Of Breath    As of 09/01/2014. Pt can take Breo   Levofloxacin Other (See Comments)    Nausea, brain fog, depression, as of 12/12/14   Pollen Extract Cough   Qvar [Beclomethasone] Other (See Comments)    Lungs closing-as of 05/03/2015   Singulair [Montelukast] Shortness Of Breath   Symbicort [Budesonide-Formoterol Fumarate] Other (See Comments)    Hard time breathing as of 10/31/2014.    Current Outpatient Medications  Medication Sig Dispense Refill   acetaminophen  (TYLENOL ) 500 MG tablet Take 1 tablet (500 mg total) by mouth 3 (three) times daily as needed for moderate pain.     albuterol  (PROVENTIL ) (2.5 MG/3ML) 0.083% nebulizer solution TAKE 3 MLS (2.5 MG TOTAL) BY NEBULIZATION EVERY 6 (SIX) HOURS AS NEEDED FOR SHORTNESS OF BREATH. 150 mL 1   albuterol  (VENTOLIN  HFA) 108 (90 Base) MCG/ACT inhaler Inhale 2 puffs into the lungs every 4 (four) hours  as needed for wheezing or shortness of breath. 6.7 g 3   aspirin  81 MG chewable tablet Chew 1 tablet (81 mg total) by mouth daily.     BREO ELLIPTA  200-25 MCG/ACT AEPB TAKE 1 PUFF BY MOUTH EVERY DAY 180 each 1   carvedilol  (COREG ) 6.25 MG tablet Take 1 tablet (6.25 mg total) by mouth 2 (two) times daily with a meal. 60 tablet 11   Cholecalciferol (VITAMIN D3) 25 MCG (1000 UT) capsule Take 2 capsules (2,000 Units total) by mouth daily. 30 capsule    clopidogrel  (PLAVIX ) 75 MG tablet TAKE 1 TABLET BY MOUTH DAILY WITH BREAKFAST. 90 tablet 2   doxycycline  (VIBRA -TABS) 100 MG tablet Take 1 tablet (100 mg total) by mouth 2 (two) times daily. 20 tablet  0   ezetimibe  (ZETIA ) 10 MG tablet TAKE 1 TABLET BY MOUTH EVERY DAY 90 tablet 3   fluticasone  (FLONASE ) 50 MCG/ACT nasal spray Place 2 sprays into both nostrils daily. 16 g 12   hydroxyurea  (HYDREA ) 500 MG capsule Take 3 capsules (1,500 mg total) by mouth daily. May take with food to minimize GI side effects. 270 capsule 2   ipratropium-albuterol  (DUONEB) 0.5-2.5 (3) MG/3ML SOLN Take 3 mLs by nebulization every 6 (six) hours as needed. 360 mL 1   loratadine (CLARITIN) 10 MG tablet Take 10 mg by mouth daily.     Multiple Vitamin (MULTIVITAMIN) tablet Take 1 tablet by mouth daily.     Multiple Vitamins-Minerals (PRESERVISION AREDS 2) CAPS Take 2 each by mouth daily at 12 noon.     mupirocin  ointment (BACTROBAN ) 2 % Apply topically  wound at right lower leg and cover with bandage daily until healed. 22 g 0   nitroGLYCERIN  (NITROSTAT ) 0.4 MG SL tablet Place 1 tablet (0.4 mg total) under the tongue every 5 (five) minutes as needed for chest pain. 25 tablet 3   predniSONE  (DELTASONE ) 20 MG tablet Take 1 tablet (20 mg total) by mouth daily with breakfast. 7  days 7 tablet 7   rosuvastatin  (CRESTOR ) 20 MG tablet Take 1 tablet (20 mg total) by mouth at bedtime. 90 tablet 3   sacubitril -valsartan  (ENTRESTO ) 24-26 MG Take 2 tablets by mouth 2 (two) times daily.      sertraline  (ZOLOFT ) 100 MG tablet Take 1 tablet (100 mg total) by mouth daily. 90 tablet 4   Tiotropium Bromide  Monohydrate (SPIRIVA  RESPIMAT) 2.5 MCG/ACT AERS Inhale 2 puffs into the lungs daily. 12 g 3   No current facility-administered medications for this visit.    OBJECTIVE: Vitals:   09/18/23 1408  BP: (!) 153/79  Pulse: 87  Resp: 20  Temp: 97.6 F (36.4 C)  SpO2: 96%     Body mass index is 30.47 kg/m.    ECOG FS:0 - Asymptomatic  General: Well-developed, well-nourished, no acute distress. Eyes: Pink conjunctiva, anicteric sclera. HEENT: Normocephalic, moist mucous membranes. Lungs: No audible wheezing or coughing. Heart: Regular rate and rhythm. Abdomen: Soft, nontender, no obvious distention. Musculoskeletal: No edema, cyanosis, or clubbing. Neuro: Alert, answering all questions appropriately. Cranial nerves grossly intact. Skin: No rashes or petechiae noted. Psych: Normal affect.  LAB RESULTS:  Lab Results  Component Value Date   NA 138 08/27/2023   K 3.9 08/27/2023   CL 105 08/27/2023   CO2 26 08/27/2023   GLUCOSE 127 (H) 08/27/2023   BUN 19 08/27/2023   CREATININE 1.16 (H) 08/27/2023   CALCIUM  9.7 08/27/2023   PROT 6.5 08/27/2023   ALBUMIN 3.3 (L) 08/27/2023   AST 18 08/27/2023   ALT 15 08/27/2023   ALKPHOS 79 08/27/2023   BILITOT 0.4 08/27/2023   GFRNONAA 46 (L) 08/27/2023   GFRAA >60 01/07/2020    Lab Results  Component Value Date   WBC 8.4 09/18/2023   NEUTROABS 7.4 09/18/2023   HGB 13.7 09/18/2023   HCT 41.0 09/18/2023   MCV 102.2 (H) 09/18/2023   PLT 786 (H) 09/18/2023     STUDIES: DG Chest 2 View Result Date: 08/27/2023 CLINICAL DATA:  Cough. EXAM: CHEST - 2 VIEW COMPARISON:  Chest radiograph dated 05/26/2023 FINDINGS: Stable cardiomediastinal silhouette. Prior median sternotomy and CABG. Aortic atherosclerosis. No focal consolidation, pleural effusion, or pneumothorax. Prior left mastectomy. No acute osseous abnormality. IMPRESSION: No  acute cardiopulmonary findings. Electronically Signed   By:  Mannie Seek M.D.   On: 08/27/2023 13:09    ASSESSMENT: Essential thrombocytosis with CALR mutation.  PLAN:    Essential thrombocytosis with CALR mutation: Patient's platelet count has trended up and is now 786.  Will not change dose of Hydrea  at this time. Continue Hydrea  1500 mg daily.  We discussed the possibility that if her platelet count continues to trend up either increasing her dose of Hydrea  or possibly switching to anagrelide.  No further interventions are needed.  Return to clinic in 6 weeks for laboratory work only and then in 3 months for laboratory work and further evaluation. Macrocytosis: Likely secondary Hydrea .  Monitor. Cardiac disease: Being medically managed.  Follow-up with her cardiologist as indicated. Hypertension: Patient's blood pressure is moderately elevated today.  Continue monitoring and treatment per primary care.   Patient expressed understanding and was in agreement with this plan. She also understands that She can call clinic at any time with any questions, concerns, or complaints.    Shellie Dials, MD   09/18/2023 2:26 PM

## 2023-09-28 ENCOUNTER — Other Ambulatory Visit: Payer: Self-pay | Admitting: Physician Assistant

## 2023-10-03 DIAGNOSIS — G4730 Sleep apnea, unspecified: Secondary | ICD-10-CM | POA: Diagnosis not present

## 2023-10-03 DIAGNOSIS — R0902 Hypoxemia: Secondary | ICD-10-CM | POA: Diagnosis not present

## 2023-10-08 ENCOUNTER — Telehealth: Payer: Self-pay | Admitting: Internal Medicine

## 2023-10-08 ENCOUNTER — Telehealth: Payer: Self-pay

## 2023-10-08 NOTE — Telephone Encounter (Signed)
 Patient needs PFT scheduled 10/14/2023 afternoon or 10/15/2023 morning.

## 2023-10-08 NOTE — Telephone Encounter (Signed)
 ONO reviewed by Dr. Auston Left- Low SpO2 51%. 2L  oxygen needed at night.   I have notified the patient. She already has O2 at home. She will start wearing 2L at bedtime and when sleeping today.   Nothing further needed.

## 2023-10-15 ENCOUNTER — Encounter: Payer: Self-pay | Admitting: Internal Medicine

## 2023-10-15 ENCOUNTER — Ambulatory Visit: Admitting: Internal Medicine

## 2023-10-15 VITALS — BP 114/70 | HR 75 | Temp 98.1°F | Ht 59.0 in | Wt 155.0 lb

## 2023-10-15 DIAGNOSIS — Z87891 Personal history of nicotine dependence: Secondary | ICD-10-CM | POA: Diagnosis not present

## 2023-10-15 DIAGNOSIS — Z9981 Dependence on supplemental oxygen: Secondary | ICD-10-CM

## 2023-10-15 DIAGNOSIS — J9611 Chronic respiratory failure with hypoxia: Secondary | ICD-10-CM | POA: Diagnosis not present

## 2023-10-15 DIAGNOSIS — J449 Chronic obstructive pulmonary disease, unspecified: Secondary | ICD-10-CM | POA: Diagnosis not present

## 2023-10-15 MED ORDER — BUDESONIDE 0.5 MG/2ML IN SUSP: 0.5000 mg | Freq: Two times a day (BID) | RESPIRATORY_TRACT | 0 refills | Status: AC

## 2023-10-15 MED ORDER — ARFORMOTEROL TARTRATE 15 MCG/2ML IN NEBU: 15.0000 ug | INHALATION_SOLUTION | Freq: Two times a day (BID) | RESPIRATORY_TRACT | 12 refills | Status: AC

## 2023-10-15 NOTE — Patient Instructions (Signed)
 START PULMICORT NEBS TWICE DAILY  START BROVANA NEBS TWICE DAILY  USE DOUNEBS EVERY 6 HRS AS NEEDED  STOP BREO STOP SPIRIVA   DME REFERRAL FOR FLUTTER VALVE USE 10 times per day  Avoid Allergens and Irritants Avoid secondhand smoke Avoid SICK contacts Recommend  Masking  when appropriate Recommend Keep up-to-date with vaccinations  Continue oxygen as prescribed

## 2023-10-15 NOTE — Progress Notes (Unsigned)
 [facility] Pulmonary Medicine Consultation      Date: 10/15/2023,   MRN# [id number] Nancy Merritt 1941/04/11  TESTS Pulmonary function testing 2017 Postbronchodilator FEV1 FVC ratio was 63% predicted FEV1 was 73% predicted Positive bronchodilator response at 21% FVC is 86% predicted TLC was 98% predicted RV 107% predicted Expiratory limb on flow volume loops show significant obstructive pattern Findings are concerning for moderate obstructive lung disease    Chest x-ray August 27, 2023 No evidence of pneumonia No evidence of edema No effusions Consider hyperexpanded lung possible flattened diaphragms   CHIEF COMPLAINT:   Assessment of shortness of breath with exertion Patient with wheezing and chronic bronchitis coughing congestion   HISTORY OF PRESENT ILLNESS   83 year old pleasant white female uses a cane for walking here to assess for her progressive shortness of breath over the last 4 months Current inhaler therapy includes albuterol  inhaler as needed and BREO inhaler  Ambulatory pulse ox shows progressive dyspnea exertion with hypoxia in the office at 83%, patient will be placed on oxygen therapy and was prescribed oxygen Patient continues to wear oxygen as prescribed  Based on previous pulmonary function testing patient has moderate obstructive pulmonary disease  No exacerbation at this time No evidence of heart failure at this time No evidence or signs of infection at this time No respiratory distress No fevers, chills, nausea, vomiting, diarrhea No evidence of lower extremity edema No evidence hemoptysis   Patient had several exacerbations since December Had several rounds of antibiotics and prednisone  Patient with wheezing and shortness of breath and coughing Patient now with hypoxic respiratory failure Patient placed on oxygen therapy Oxygen levels improved with oxygen therapy  Patient has ongoing symptoms of chest congestion chronic productive  cough chronic bronchitis wheezing At this point patient has very poor respiratory and inspiratory capacity therefore traditional inhaler therapy will need to be stopped and patient will need maintenance therapy with nebulizers I have explained this to patient and daughter in detail      PAST MEDICAL HISTORY   Past Medical History:  Diagnosis Date   Arthritis    Asthma    Basal cell carcinoma 08/18/2023   right medial pretibial, EDC   Cancer (HCC) 1990   COVID-19 06/14/2020   Depression    Hx of breast cancer 04/07/2018   1990. No screening in last 25 years     SURGICAL HISTORY   Past Surgical History:  Procedure Laterality Date   BREAST SURGERY Left 1990   CORONARY ARTERY BYPASS GRAFT     JOINT REPLACEMENT Right    Hip   LEFT HEART CATH AND CORS/GRAFTS ANGIOGRAPHY N/A 06/05/2022   Procedure: LEFT HEART CATH AND CORS/GRAFTS ANGIOGRAPHY;  Surgeon: Wenona Hamilton, MD;  Location: [facility] INV[facility];  Service: Cardiovascular;  Laterality: N/A;   OVARIAN CYST REMOVAL  1970   TUBAL LIGATION  1970     FAMILY HISTORY   Family History  Problem Relation Age of Onset   Cancer Father        lung   Heart attack Father 54   Alcohol abuse Father    Arthritis Father    COPD Father    Diabetes Father    Hypertension Father    Breast cancer Sister 61   Arthritis Sister    Cancer Sister    COPD Sister    Drug abuse Sister    Hypertension Sister    Miscarriages / [place] Sister    Thyroid  disease Sister    Arthritis Mother  COPD Mother    Diabetes Mother    Hearing loss Mother    Hypertension Mother    Miscarriages / [place] Mother    Stroke Mother    Alcohol abuse Brother    Arthritis Brother    Hypertension Brother    Arthritis Daughter    Asthma Daughter    Depression Daughter    Hypertension Daughter    Miscarriages / [place] Daughter    Thyroid  disease Daughter    Arthritis Maternal Grandmother    Hypertension Maternal Grandmother    Stroke  Maternal Grandmother    Thyroid  disease Maternal Grandmother    Alcohol abuse Maternal Grandfather    Arthritis Maternal Grandfather    Cancer Maternal Grandfather    Depression Maternal Grandfather    Hearing loss Maternal Grandfather    Hypertension Maternal Grandfather    Arthritis Sister    Thyroid  disease Sister    Thyroid  disease Daughter    Hypertension Daughter    Depression Daughter    COPD Daughter    Alcohol abuse Daughter    Arthritis Daughter    Arthritis Daughter    COPD Daughter    Depression Daughter    Hearing loss Daughter    Hypertension Daughter    Thyroid  disease Daughter      SOCIAL HISTORY   Social History   Tobacco Use   Smoking status: Former    Current packs/day: 0.00    Average packs/day: 1 pack/day for 40.0 years (40.0 ttl pk-yrs)    Types: Cigarettes    Start date: 14    Quit date: 2002    Years since quitting: 23.4   Smokeless tobacco: Never  Vaping Use   Vaping status: Never Used  Substance Use Topics   Alcohol use: Not Currently   Drug use: Never     MEDICATIONS    Home Medication:  Current Outpatient Rx   Order #: [id number] Class: OTC   Order #: [id number] Class: Normal   Order #: [id number] Class: Normal   Order #: [id number] Class: OTC   Order #: [id number] Class: Normal   Order #: [id number] Class: Normal   Order #: [id number] Class: OTC   Order #: [id number] Class: Normal   Order #: [id number] Class: Normal   Order #: [id number] Class: Normal   Order #: [id number] Class: Normal   Order #: [id number] Class: Normal   Order #: [id number] Class: Normal   Order #: [id number] Class: Historical Med   Order #: [id number] Class: Historical Med   Order #: [id number] Class: Historical Med   Order #: [id number] Class: Normal   Order #: [id number] Class: Normal   Order #: [id number] Class: Normal   Order #: [id number] Class: Normal   Order #: [id number] Class: No Print   Order #: [id number] Class: Normal   Order #: [id number] Class: Normal    Current  Medication:  Current Outpatient Medications:    acetaminophen  (TYLENOL ) 500 MG tablet, Take 1 tablet (500 mg total) by mouth 3 (three) times daily as needed for moderate pain., Disp: , Rfl:    albuterol  (PROVENTIL ) (2.5 MG/3ML) 0.083% nebulizer solution, TAKE 3 MLS (2.5 MG TOTAL) BY NEBULIZATION EVERY 6 (SIX) HOURS AS NEEDED FOR SHORTNESS OF BREATH., Disp: 150 mL, Rfl: 1   albuterol  (VENTOLIN  HFA) 108 (90 Base) MCG/ACT inhaler, Inhale 2 puffs into the lungs every 4 (four) hours as needed for wheezing or shortness of breath., Disp: 6.7 g, Rfl: 3   aspirin  81 MG chewable tablet, Chew 1 tablet (81 mg total) by mouth daily., Disp: , Rfl:    BREO ELLIPTA  200-25 MCG/ACT AEPB, TAKE 1 PUFF  BY MOUTH EVERY DAY, Disp: 180 each, Rfl: 1   carvedilol  (COREG ) 6.25 MG tablet, TAKE 1 TABLET BY MOUTH 2 TIMES DAILY WITH A MEAL., Disp: 180 tablet, Rfl: 3   Cholecalciferol (VITAMIN D3) 25 MCG (1000 UT) capsule, Take 2 capsules (2,000 Units total) by mouth daily., Disp: 30 capsule, Rfl:    clopidogrel  (PLAVIX ) 75 MG tablet, TAKE 1 TABLET BY MOUTH DAILY WITH BREAKFAST., Disp: 90 tablet, Rfl: 2   doxycycline  (VIBRA -TABS) 100 MG tablet, Take 1 tablet (100 mg total) by mouth 2 (two) times daily., Disp: 20 tablet, Rfl: 0   ezetimibe  (ZETIA ) 10 MG tablet, TAKE 1 TABLET BY MOUTH EVERY DAY, Disp: 90 tablet, Rfl: 3   fluticasone  (FLONASE ) 50 MCG/ACT nasal spray, Place 2 sprays into both nostrils daily., Disp: 16 g, Rfl: 12   hydroxyurea  (HYDREA ) 500 MG capsule, Take 3 capsules (1,500 mg total) by mouth daily. May take with food to minimize GI side effects., Disp: 270 capsule, Rfl: 2   ipratropium-albuterol  (DUONEB) 0.5-2.5 (3) MG/3ML SOLN, Take 3 mLs by nebulization every 6 (six) hours as needed., Disp: 360 mL, Rfl: 1   loratadine (CLARITIN) 10 MG tablet, Take 10 mg by mouth daily., Disp: , Rfl:    Multiple Vitamin (MULTIVITAMIN) tablet, Take 1 tablet by mouth daily., Disp: , Rfl:    Multiple Vitamins-Minerals (PRESERVISION  AREDS 2) CAPS, Take 2 each by mouth daily at 12 noon., Disp: , Rfl:    mupirocin  ointment (BACTROBAN ) 2 %, Apply topically  wound at right lower leg and cover with bandage daily until healed., Disp: 22 g, Rfl: 0   nitroGLYCERIN  (NITROSTAT ) 0.4 MG SL tablet, Place 1 tablet (0.4 mg total) under the tongue every 5 (five) minutes as needed for chest pain., Disp: 25 tablet, Rfl: 3   predniSONE  (DELTASONE ) 20 MG tablet, Take 1 tablet (20 mg total) by mouth daily with breakfast. 7  days, Disp: 7 tablet, Rfl: 7   rosuvastatin  (CRESTOR ) 20 MG tablet, Take 1 tablet (20 mg total) by mouth at bedtime., Disp: 90 tablet, Rfl: 3   sacubitril -valsartan  (ENTRESTO ) 24-26 MG, Take 2 tablets by mouth 2 (two) times daily., Disp: , Rfl:    sertraline  (ZOLOFT ) 100 MG tablet, Take 1 tablet (100 mg total) by mouth daily., Disp: 90 tablet, Rfl: 4   Tiotropium Bromide  Monohydrate (SPIRIVA  RESPIMAT) 2.5 MCG/ACT AERS, Inhale 2 puffs into the lungs daily., Disp: 12 g, Rfl: 3    ALLERGIES   Advair diskus [fluticasone -salmeterol], Levofloxacin, Pollen extract, Qvar [beclomethasone], Singulair [montelukast], and Symbicort [budesonide-formoterol fumarate]  BP 114/70 (BP Location: Right Arm, Patient Position: Sitting, Cuff Size: Large)   Pulse 75   Temp 98.1 F (36.7 C) (Oral)   Ht 4\' 11"  (1.499 m)   Wt 155 lb (70.3 kg)   SpO2 94% Comment: on 2L of O2  BMI 31.31 kg/m      Review of Systems: Gen:  Denies  fever, sweats, chills weight loss  HEENT: Denies blurred vision, double vision, ear pain, eye pain, hearing loss, nose bleeds, sore throat Cardiac:  No dizziness, chest pain or heaviness, chest tightness,edema, No JVD Resp:   No cough, -sputum production, +shortness of breath,+wheezing, -hemoptysis,  Other:  All other systems negative   Physical Examination:   General Appearance: No distress  EYES PERRLA, EOM intact.   NECK Supple, No JVD Pulmonary: normal breath sounds, No wheezing.  CardiovascularNormal  S1,S2.  No m/r/g.   Abdomen: Benign, Soft, non-tender. Neurology UE/LE 5/5 strength, no focal deficits Ext  pulses intact, cap refill intact ALL OTHER ROS ARE NEGATIVE        ASSESSMENT/PLAN   83 year old pleasant white female seen today for assessment of COPD with moderate obstructive pulmonary disease based on pulmonary function tests in the last several months had several bouts of COPD exacerbations requiring prednisone  and antibiotics patient was diagnosed with hypoxic respiratory failure and was started on oxygen therapy at last office visit and at this point patient is showing continuing signs and symptoms of chronic bronchitis with probable underlying bronchiectasis  Her inspiratory capacity is diminished and unable to take traditional inhaler therapy at this time    Assessment of COPD Probable progression of moderate COPD Previous FEV1 was in the 70s Patient with poor respiratory and inspiratory capacity Patient no longer can take traditional inhaler therapy Will start nebulized therapy Pulmicort twice daily Brovana twice daily Recommend using DuoNebs every 6 hours as needed Aggressive pulmonary toilet with flutter valve 10-15 times per day No indication for antibiotics or prednisone  at this time   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 -Patient benefits from oxygen therapy 2L [place]  -recommend using oxygen as prescribed -patient needs this for survival    MEDICATION ADJUSTMENTS/LABS AND TESTS ORDERED: START PULMICORT NEBS TWICE DAILY START BROVANA NEBS TWICE DAILY USE DOUNEBS EVERY 6 HRS AS NEEDED STOP BREO STOP SPIRIVA  DME REFERRAL FOR FLUTTER VALVE USE 10 times per day Avoid Allergens and Irritants Avoid secondhand smoke Avoid SICK contacts Recommend  Masking  when appropriate Recommend Keep up-to-date with vaccinations Continue oxygen as prescribed  CURRENT MEDICATIONS REVIEWED AT LENGTH WITH PATIENT TODAY   Patient  satisfied with Plan of action and  management. All questions answered  Follow up 3 weeks  I spent a total of 49 minutes reviewing chart data, face-to-face evaluation with the patient, counseling and coordination of care as detailed above.     Lady Pier, M.D.  [facility]  Medical Director [facility] Medical Director [facility] Cardio-Pulmonary Department

## 2023-10-16 ENCOUNTER — Other Ambulatory Visit (HOSPITAL_COMMUNITY): Payer: Self-pay

## 2023-10-16 ENCOUNTER — Telehealth: Payer: Self-pay

## 2023-10-16 NOTE — Telephone Encounter (Signed)
*  Pulm  Pharmacy Patient Advocate Encounter   Received notification from CoverMyMeds that prior authorization for Arformoterol  Tartrate 15MCG/2ML nebulizer solution  is required/requested.   Insurance verification completed.   The patient is insured through [facility] ADVANTAGE/RX ADVANCE .   Per test claim: PA required; PA submitted to above mentioned insurance via CoverMyMeds Key/confirmation #/EOC [facility] Status is pending

## 2023-10-16 NOTE — Telephone Encounter (Signed)
 Approved today by [employer] 2017 29-MAY-25:29-MAY-26 Arformoterol Tartrate 15MCG/2ML IN NEBU Quantity:120;

## 2023-10-17 ENCOUNTER — Other Ambulatory Visit: Payer: Self-pay

## 2023-10-17 DIAGNOSIS — J9611 Chronic respiratory failure with hypoxia: Secondary | ICD-10-CM | POA: Diagnosis not present

## 2023-10-17 DIAGNOSIS — J449 Chronic obstructive pulmonary disease, unspecified: Secondary | ICD-10-CM | POA: Diagnosis not present

## 2023-10-17 MED ORDER — CLOPIDOGREL BISULFATE 75 MG PO TABS
75.0000 mg | ORAL_TABLET | Freq: Every day | ORAL | 3 refills | Status: AC
Start: 2023-10-17 — End: ?

## 2023-10-28 ENCOUNTER — Encounter: Payer: Self-pay | Admitting: Internal Medicine

## 2023-10-29 ENCOUNTER — Other Ambulatory Visit: Payer: Self-pay | Admitting: *Deleted

## 2023-10-29 ENCOUNTER — Ambulatory Visit: Admitting: Internal Medicine

## 2023-10-29 ENCOUNTER — Ambulatory Visit: Payer: Self-pay | Admitting: Family Medicine

## 2023-10-29 ENCOUNTER — Encounter: Payer: Self-pay | Admitting: Internal Medicine

## 2023-10-29 ENCOUNTER — Other Ambulatory Visit (HOSPITAL_COMMUNITY): Payer: Self-pay

## 2023-10-29 VITALS — BP 108/60 | HR 98 | Temp 98.0°F | Ht 59.0 in | Wt 145.6 lb

## 2023-10-29 DIAGNOSIS — J449 Chronic obstructive pulmonary disease, unspecified: Secondary | ICD-10-CM

## 2023-10-29 DIAGNOSIS — Z9981 Dependence on supplemental oxygen: Secondary | ICD-10-CM | POA: Diagnosis not present

## 2023-10-29 DIAGNOSIS — D75839 Thrombocytosis, unspecified: Secondary | ICD-10-CM

## 2023-10-29 DIAGNOSIS — J479 Bronchiectasis, uncomplicated: Secondary | ICD-10-CM | POA: Diagnosis not present

## 2023-10-29 DIAGNOSIS — Z87891 Personal history of nicotine dependence: Secondary | ICD-10-CM

## 2023-10-29 DIAGNOSIS — R0602 Shortness of breath: Secondary | ICD-10-CM

## 2023-10-29 DIAGNOSIS — J9611 Chronic respiratory failure with hypoxia: Secondary | ICD-10-CM | POA: Diagnosis not present

## 2023-10-29 MED ORDER — IPRATROPIUM-ALBUTEROL 0.5-2.5 (3) MG/3ML IN SOLN
3.0000 mL | Freq: Once | RESPIRATORY_TRACT | Status: AC
  Administered 2023-10-29: 3 mL via RESPIRATORY_TRACT

## 2023-10-29 MED ORDER — ROFLUMILAST 500 MCG PO TABS
500.0000 ug | ORAL_TABLET | Freq: Every day | ORAL | 3 refills | Status: DC
Start: 2023-10-29 — End: 2024-02-05

## 2023-10-29 NOTE — Progress Notes (Signed)
 [facility] [place] Pulmonary Medicine Consultation      Date: 10/29/2023,   MRN# [id number] Nancy Merritt 01/12/1941  TESTS Pulmonary function testing 2017 Postbronchodilator FEV1 FVC ratio was 63% predicted FEV1 was 73% predicted Positive bronchodilator response at 21% FVC is 86% predicted TLC was 98% predicted RV 107% predicted Expiratory limb on flow volume loops show significant obstructive pattern Findings are concerning for moderate obstructive lung disease   Ambulatory pulse ox shows progressive dyspnea exertion with hypoxia in the office at 83%, placed on oxygen therapy   Chest x-ray August 27, 2023 No evidence of pneumonia No evidence of edema No effusions Consider hyperexpanded lung possible flattened diaphragms   CHIEF COMPLAINT:   Assessment of shortness of breath with exertion Chronic bronchitis and wheezing with bronchiectasis  HISTORY OF PRESENT ILLNESS   83 year old pleasant white female uses a cane for walking here to assess for her progressive shortness of breath over the last 4 months Patient unable to use traditional inhaler therapy, patient was prescribed nebulized therapy as maintenance therapy-it does not seem to help patient still has cough with intermittent sputum production intermittent wheezing  Patient has ongoing symptoms of chest congestion chronic productive cough chronic bronchitis wheezing At this point patient has very poor respiratory and inspiratory capacity therefore traditional inhaler therapy will need to be stopped and patient will need maintenance therapy with nebulizers I have explained this to patient and daughter in detail Patient with significant findings of bronchiectasis on CT scan May consider chest physiotherapy with Smart vest With a history of COPD,patient may need PDE 4 inhibitor with ROFLUMALIST Plan to give DuoNeb while in office today  No exacerbation at this time No evidence of heart failure at this time No evidence or  signs of infection at this time No respiratory distress No fevers, chills, nausea, vomiting, diarrhea No evidence of lower extremity edema No evidence hemoptysis  Patient was placed on oxygen therapy at our last office visit Patient continues to wear oxygen as prescribed  Based on previous pulmonary function testing patient has moderate obstructive pulmonary disease  Patient had several exacerbations since December Had several rounds of antibiotics and prednisone  Patient with wheezing and shortness of breath and coughing   PAST MEDICAL HISTORY   Past Medical History:  Diagnosis Date   Arthritis    Asthma    Basal cell carcinoma 08/18/2023   right medial pretibial, [facility]   Cancer (HCC) 1990   COVID-19 06/14/2020   Depression    Hx of breast cancer 04/07/2018   1990. No screening in last 25 years     SURGICAL HISTORY   Past Surgical History:  Procedure Laterality Date   BREAST SURGERY Left 1990   CORONARY ARTERY BYPASS GRAFT     JOINT REPLACEMENT Right    Hip   LEFT HEART CATH AND CORS/GRAFTS ANGIOGRAPHY N/A 06/05/2022   Procedure: LEFT HEART CATH AND CORS/GRAFTS ANGIOGRAPHY;  Surgeon: Wenona Hamilton, MD;  Location: [facility] INVASIVE CV LAB;  Service: Cardiovascular;  Laterality: N/A;   OVARIAN CYST REMOVAL  1970   TUBAL LIGATION  1970     FAMILY HISTORY   Family History  Problem Relation Age of Onset   Cancer Father        lung   Heart attack Father 57   Alcohol abuse Father    Arthritis Father    COPD Father    Diabetes Father    Hypertension Father    Breast cancer Sister 49   Arthritis Sister  Cancer Sister    COPD Sister    Drug abuse Sister    Hypertension Sister    Miscarriages / [place] Sister    Thyroid  disease Sister    Arthritis Mother    COPD Mother    Diabetes Mother    Hearing loss Mother    Hypertension Mother    Miscarriages / [place] Mother    Stroke Mother    Alcohol abuse Brother    Arthritis Brother    Hypertension  Brother    Arthritis Daughter    Asthma Daughter    Depression Daughter    Hypertension Daughter    Miscarriages / [place] Daughter    Thyroid  disease Daughter    Arthritis Maternal Grandmother    Hypertension Maternal Grandmother    Stroke Maternal Grandmother    Thyroid  disease Maternal Grandmother    Alcohol abuse Maternal Grandfather    Arthritis Maternal Grandfather    Cancer Maternal Grandfather    Depression Maternal Grandfather    Hearing loss Maternal Grandfather    Hypertension Maternal Grandfather    Arthritis Sister    Thyroid  disease Sister    Thyroid  disease Daughter    Hypertension Daughter    Depression Daughter    COPD Daughter    Alcohol abuse Daughter    Arthritis Daughter    Arthritis Daughter    COPD Daughter    Depression Daughter    Hearing loss Daughter    Hypertension Daughter    Thyroid  disease Daughter      SOCIAL HISTORY   Social History   Tobacco Use   Smoking status: Former    Current packs/day: 0.00    Average packs/day: 1 pack/day for 40.0 years (40.0 ttl pk-yrs)    Types: Cigarettes    Start date: 46    Quit date: 2002    Years since quitting: 23.4   Smokeless tobacco: Never  Vaping Use   Vaping status: Never Used  Substance Use Topics   Alcohol use: Not Currently   Drug use: Never     MEDICATIONS    Home Medication:  Current Outpatient Rx   Order #: [id number] Class: OTC   Order #: [id number] Class: Normal   Order #: [id number] Class: Normal   Order #: [id number] Class: Normal   Order #: [id number] Class: OTC   Order #: [id number] Class: Normal   Order #: [id number] Class: Normal   Order #: [id number] Class: Normal   Order #: [id number] Class: OTC   Order #: [id number] Class: Normal   Order #: [id number] Class: Normal   Order #: [id number] Class: Normal   Order #: [id number] Class: Normal   Order #: [id number] Class: Normal   Order #: [id number] Class: Normal   Order #: [id number] Class: Historical Med   Order #: [id number] Class:  Historical Med   Order #: [id number] Class: Historical Med   Order #: [id number] Class: Normal   Order #: [id number] Class: Normal   Order #: [id number] Class: Normal   Order #: [id number] Class: Normal   Order #: [id number] Class: No Print   Order #: [id number] Class: Normal   Order #: [id number] Class: Normal    Current Medication:  Current Outpatient Medications:    acetaminophen  (TYLENOL ) 500 MG tablet, Take 1 tablet (500 mg total) by mouth 3 (three) times daily as needed for moderate pain., Disp: , Rfl:    albuterol  (PROVENTIL ) (2.5 MG/3ML) 0.083% nebulizer solution, TAKE 3 MLS (2.5 MG TOTAL) BY NEBULIZATION EVERY 6 (SIX) HOURS AS NEEDED FOR SHORTNESS OF BREATH., Disp: 150 mL, Rfl: 1   albuterol  (VENTOLIN  HFA) 108 (90 Base) MCG/ACT inhaler, Inhale 2  puffs into the lungs every 4 (four) hours as needed for wheezing or shortness of breath., Disp: 6.7 g, Rfl: 3   arformoterol  (BROVANA ) 15 MCG/2ML NEBU, Take 2 mLs (15 mcg total) by nebulization 2 (two) times daily., Disp: 120 mL, Rfl: 12   aspirin  81 MG chewable tablet, Chew 1 tablet (81 mg total) by mouth daily., Disp: , Rfl:    BREO ELLIPTA  200-25 MCG/ACT AEPB, TAKE 1 PUFF BY MOUTH EVERY DAY, Disp: 180 each, Rfl: 1   budesonide  (PULMICORT ) 0.5 MG/2ML nebulizer solution, Take 2 mLs (0.5 mg total) by nebulization 2 (two) times daily., Disp: 1440 mL, Rfl: 0   carvedilol  (COREG ) 6.25 MG tablet, TAKE 1 TABLET BY MOUTH 2 TIMES DAILY WITH A MEAL., Disp: 180 tablet, Rfl: 3   Cholecalciferol (VITAMIN D3) 25 MCG (1000 UT) capsule, Take 2 capsules (2,000 Units total) by mouth daily., Disp: 30 capsule, Rfl:    clopidogrel  (PLAVIX ) 75 MG tablet, Take 1 tablet (75 mg total) by mouth daily with breakfast., Disp: 90 tablet, Rfl: 3   doxycycline  (VIBRA -TABS) 100 MG tablet, Take 1 tablet (100 mg total) by mouth 2 (two) times daily., Disp: 20 tablet, Rfl: 0   ezetimibe  (ZETIA ) 10 MG tablet, TAKE 1 TABLET BY MOUTH EVERY DAY, Disp: 90 tablet, Rfl: 3   fluticasone  (FLONASE ) 50  MCG/ACT nasal spray, Place 2 sprays into both nostrils daily., Disp: 16 g, Rfl: 12   hydroxyurea  (HYDREA ) 500 MG capsule, Take 3 capsules (1,500 mg total) by mouth daily. May take with food to minimize GI side effects., Disp: 270 capsule, Rfl: 2   ipratropium-albuterol  (DUONEB) 0.5-2.5 (3) MG/3ML SOLN, Take 3 mLs by nebulization every 6 (six) hours as needed., Disp: 360 mL, Rfl: 1   loratadine (CLARITIN) 10 MG tablet, Take 10 mg by mouth daily., Disp: , Rfl:    Multiple Vitamin (MULTIVITAMIN) tablet, Take 1 tablet by mouth daily., Disp: , Rfl:    Multiple Vitamins-Minerals (PRESERVISION AREDS 2) CAPS, Take 2 each by mouth daily at 12 noon., Disp: , Rfl:    mupirocin  ointment (BACTROBAN ) 2 %, Apply topically  wound at right lower leg and cover with bandage daily until healed., Disp: 22 g, Rfl: 0   nitroGLYCERIN  (NITROSTAT ) 0.4 MG SL tablet, Place 1 tablet (0.4 mg total) under the tongue every 5 (five) minutes as needed for chest pain., Disp: 25 tablet, Rfl: 3   predniSONE  (DELTASONE ) 20 MG tablet, Take 1 tablet (20 mg total) by mouth daily with breakfast. 7  days, Disp: 7 tablet, Rfl: 7   rosuvastatin  (CRESTOR ) 20 MG tablet, Take 1 tablet (20 mg total) by mouth at bedtime., Disp: 90 tablet, Rfl: 3   sacubitril -valsartan  (ENTRESTO ) 24-26 MG, Take 2 tablets by mouth 2 (two) times daily., Disp: , Rfl:    sertraline  (ZOLOFT ) 100 MG tablet, Take 1 tablet (100 mg total) by mouth daily., Disp: 90 tablet, Rfl: 4   Tiotropium Bromide  Monohydrate (SPIRIVA  RESPIMAT) 2.5 MCG/ACT AERS, Inhale 2 puffs into the lungs daily., Disp: 12 g, Rfl: 3    ALLERGIES   Advair diskus [fluticasone -salmeterol], Levofloxacin, Pollen extract, Qvar [beclomethasone], Singulair [montelukast], and Symbicort [budesonide -formoterol fumarate]  BP 108/60 (BP Location: Left Arm, Patient Position: Sitting, Cuff Size: Normal)   Pulse 98   Temp 98 F (36.7 C) (Oral)   Ht 4' 11 (1.499 m)   Wt 145 lb 9.6 oz (66 kg)   SpO2 93% Comment:  on 2L of o2  BMI 29.41 kg/m   Review of Systems: Gen:  Denies  fever, sweats, chills weight loss  HEENT: Denies blurred vision, double vision, ear pain, eye pain, hearing loss, nose bleeds, sore throat Cardiac:  No dizziness, chest pain or heaviness, chest tightness,edema, No JVD Resp:  + cough, +sputum production, +shortness of breath,+wheezing, -hemoptysis,  Other:  All other systems negative   Physical Examination:   General Appearance: No distress  EYES PERRLA, EOM intact.   NECK Supple, No JVD Pulmonary: normal breath sounds, +wheezing.  CardiovascularNormal S1,S2.  No m/r/g.   Abdomen: Benign, Soft, non-tender. Neurology UE/LE 5/5 strength, no focal deficits Ext pulses intact, cap refill intact ALL OTHER ROS ARE NEGATIVE     CT chest January 2025 reviewed in detail There is evidence of bilateral bronchiectasis This could be contributing to her symptoms      ASSESSMENT/PLAN   83 year old pleasant white female seen today for assessment of COPD with moderate obstructive pulmonary disease based on pulmonary function tests in the last several months had several bouts of COPD exacerbations requiring prednisone  and antibiotics patient was diagnosed with hypoxic respiratory failure and was started on oxygen therapy at last office visit and at this point patient is showing continuing signs and symptoms of chronic bronchitis with probable underlying bronchiectasis  Her inspiratory capacity is diminished and unable to take traditional inhaler therapy at this time    Assessment of COPD Strong suspicion of progression of moderate COPD Patient with severe respiratory insufficiency unable to complete pulmonary function testing Previous FEV1 was in the 70s Patient with poor respiratory and inspiratory capacity Patient no longer can take traditional inhaler therapy Continue nebulized therapy Pulmicort  twice daily Brovana  twice daily Recommend using DuoNebs every 6 hours as  needed Aggressive pulmonary toilet with flutter valve 10-15 times per day No indication for antibiotics or prednisone  at this time At this time I would recommend starting PDE 4 inhibitor with Roflumilast DuoNeb in office today  Chronic respiratory sufficiency chronic bronchitis and bronchiectasis Patient with ongoing symptoms Recommend chest physiotherapy with Smart vest   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 -Patient benefits from oxygen therapy 2L [place]  -recommend using oxygen as prescribed -patient needs this for survival     MEDICATION ADJUSTMENTS/LABS AND TESTS ORDERED: Continue PULMICORT  NEBS TWICE DAILY CONTINUE BROVANA  NEBS TWICE DAILY START DALIRESP  FLUTTER VALVE USE 10 times per day Continue oxygen as prescribed Referral for SMART VEST Avoid Allergens and Irritants Avoid secondhand smoke Avoid SICK contacts Recommend  Masking  when appropriate Recommend Keep up-to-date with vaccinations  CURRENT MEDICATIONS REVIEWED AT LENGTH WITH PATIENT TODAY   Patient  satisfied with Plan of action and management. All questions answered  Follow up 3 weeks  I spent a total of 58 minutes reviewing chart data, face-to-face evaluation with the patient, counseling and coordination of care as detailed above.     Lady Pier, M.D.  [facility]  Medical Director [facility] Medical Director [facility] Cardio-Pulmonary Department

## 2023-10-29 NOTE — Telephone Encounter (Signed)
 Noted. Nothing further needed.

## 2023-10-29 NOTE — Telephone Encounter (Signed)
 FYI Only or Action Required?: FYI only for provider  Patient is followed in Pulmonology for shortness of breath, last seen on 10/15/2023 by Cleve Dale, MD. Called Nurse Triage reporting Shortness of Breath. Symptoms began several months ago. Interventions attempted: Nebulizer treatments. Symptoms are: unchanged.  Triage Disposition: See HCP Within 4 Hours (Or PCP Triage)  Patient/caregiver understands and will follow disposition?: Yes           Copied from CRM [id number]. Topic: Clinical - Red Word Triage >> Oct 29, 2023 11:45 AM Justina Oman C wrote: Ladena Picking cannot hold to speak with a nurse any longer and asked for a call back at [phone]. Reason for Disposition  [1] MILD difficulty breathing (e.g., minimal/no SOB at rest, SOB with walking, pulse <100) AND [2] NEW-onset or WORSE than normal  Answer Assessment - Initial Assessment Questions This RN spoke with patient's daughter, Ladena Picking. Per [address], patient is not getting any better. Patient scheduled for an appointment today.   Changed nebulizer medications and she is not doing better. Not doing worse On oxygen pretty much 100% of the time- on 2L, but is not sure if it needs to be turned up, stays in 90s, 96%-97% Denies dizziness, chest pain Constant cough  At rest she is fine unless she starts talking Difficulty catching breath since last appointment If not on oxygen her saturation goes into 80s  Protocols used: Breathing Difficulty-A-AH

## 2023-10-29 NOTE — Patient Instructions (Signed)
 Continue breathing exercises with flutter valve Continue Pulmicort  nebulizers twice daily Continue Brovana  nebulizers twice daily Recommend referral to Smart vest chest therapy Recommend starting Daliresp to help with breathing  Avoid Allergens and Irritants Avoid secondhand smoke Avoid SICK contacts Recommend  Masking  when appropriate Recommend Keep up-to-date with vaccinations

## 2023-10-30 ENCOUNTER — Inpatient Hospital Stay: Attending: Oncology

## 2023-11-03 ENCOUNTER — Telehealth: Payer: Self-pay

## 2023-11-03 ENCOUNTER — Other Ambulatory Visit: Payer: Self-pay | Admitting: Family Medicine

## 2023-11-03 ENCOUNTER — Other Ambulatory Visit (HOSPITAL_COMMUNITY): Payer: Self-pay

## 2023-11-03 DIAGNOSIS — J4530 Mild persistent asthma, uncomplicated: Secondary | ICD-10-CM

## 2023-11-03 NOTE — Telephone Encounter (Signed)
*  Pulm  Pharmacy Patient Advocate Encounter   Received notification from CoverMyMeds that prior authorization for Roflumilast  tablets  is required/requested.   Insurance verification completed.   The patient is insured through [facility] ADVANTAGE/RX ADVANCE .   Per test claim: PA required; PA submitted to above mentioned insurance via CoverMyMeds Key/confirmation #/EOC [id number] Status is pending

## 2023-11-03 NOTE — Telephone Encounter (Signed)
 Approved today by [employer] 2017 16-JUN-25:16-JUN-26 Roflumilast  OR TABS Quantity:90;  $60.71- 30 days

## 2023-11-03 NOTE — Telephone Encounter (Signed)
 Copied from CRM [id number]. Topic: General - Other >> Nov 03, 2023 11:06 AM Margarette Shawl wrote: Reason for CRM:   Pt's daughter Ladena Picking is contacting clinic regarding ongoing issues with symptoms. She will not elaborate further and would rather relay information to nurse.   Requesting call back  # [phone]

## 2023-11-12 DIAGNOSIS — H6993 Unspecified Eustachian tube disorder, bilateral: Secondary | ICD-10-CM | POA: Diagnosis not present

## 2023-11-12 DIAGNOSIS — H93292 Other abnormal auditory perceptions, left ear: Secondary | ICD-10-CM | POA: Diagnosis not present

## 2023-11-12 DIAGNOSIS — H90A32 Mixed conductive and sensorineural hearing loss, unilateral, left ear with restricted hearing on the contralateral side: Secondary | ICD-10-CM | POA: Diagnosis not present

## 2023-11-12 DIAGNOSIS — H6123 Impacted cerumen, bilateral: Secondary | ICD-10-CM | POA: Diagnosis not present

## 2023-11-12 DIAGNOSIS — H906 Mixed conductive and sensorineural hearing loss, bilateral: Secondary | ICD-10-CM | POA: Diagnosis not present

## 2023-11-20 ENCOUNTER — Encounter: Payer: Self-pay | Admitting: Internal Medicine

## 2023-11-20 ENCOUNTER — Ambulatory Visit: Admitting: Internal Medicine

## 2023-11-20 VITALS — BP 110/60 | HR 76 | Temp 97.7°F | Ht 60.0 in | Wt 141.0 lb

## 2023-11-20 DIAGNOSIS — J449 Chronic obstructive pulmonary disease, unspecified: Secondary | ICD-10-CM

## 2023-11-20 DIAGNOSIS — J479 Bronchiectasis, uncomplicated: Secondary | ICD-10-CM | POA: Diagnosis not present

## 2023-11-20 DIAGNOSIS — J9611 Chronic respiratory failure with hypoxia: Secondary | ICD-10-CM

## 2023-11-20 MED ORDER — PREDNISONE 20 MG PO TABS: 20.0000 mg | ORAL_TABLET | Freq: Every day | ORAL | 1 refills | Status: DC

## 2023-11-20 NOTE — Patient Instructions (Signed)
 Continue Daliresp  as prescribed Continue nebulized treatments Waiting for smart vest assessment  Avoid Allergens and Irritants Avoid secondhand smoke Avoid SICK contacts Recommend  Masking  when appropriate Recommend Keep up-to-date with vaccinations  Prednisone  20 mg daily for the next 3 days

## 2023-11-20 NOTE — Progress Notes (Signed)
 [facility] Pulmonary Medicine Consultation      Date: 11/20/2023,   MRN# [id number] Nancy Merritt 13-Apr-1941  TESTS Pulmonary function testing 2017 Postbronchodilator FEV1 FVC ratio was 63% predicted FEV1 was 73% predicted Positive bronchodilator response at 21% FVC is 86% predicted TLC was 98% predicted RV 107% predicted Expiratory limb on flow volume loops show significant obstructive pattern Findings are concerning for moderate obstructive lung disease   Ambulatory pulse ox shows progressive dyspnea exertion with hypoxia in the office at 83%, placed on oxygen therapy   Chest x-ray August 27, 2023 No evidence of pneumonia No evidence of edema No effusions Consider hyperexpanded lung possible flattened diaphragms   CHIEF COMPLAINT:   Assessment of shortness of breath Assessment of chronic bronchitis wheezing and bronchiectasis Follow-up assessment for chronic hypoxic respiratory failure    HISTORY OF PRESENT ILLNESS   Assessment of chronic bronchitis wheezing and bronchiectasis Patient feels significantly better with Daliresp  500 mg daily Patient did have GI side effects but seems to be tolerating at this time Patient able to feel better mucus production is significantly improved We will continue at current dose  No exacerbation at this time No evidence of heart failure at this time No evidence or signs of infection at this time No respiratory distress No fevers, chills, nausea, vomiting, diarrhea No evidence of lower extremity edema No evidence hemoptysis  Patient is still wheezing I will prescribe prednisone  20 mg for the next 3 days  Smart vest therapy assessment pending   Patient was placed on oxygen therapy at our last office visit Patient continues to wear oxygen as prescribed  Based on previous pulmonary function testing patient has moderate obstructive pulmonary disease    PAST MEDICAL HISTORY   Past Medical History:  Diagnosis Date    Arthritis    Asthma    Basal cell carcinoma 08/18/2023   right medial pretibial, [facility]   Cancer (HCC) 1990   COVID-19 06/14/2020   Depression    Hx of breast cancer 04/07/2018   1990. No screening in last 25 years     SURGICAL HISTORY   Past Surgical History:  Procedure Laterality Date   BREAST SURGERY Left 1990   CORONARY ARTERY BYPASS GRAFT     JOINT REPLACEMENT Right    Hip   LEFT HEART CATH AND CORS/GRAFTS ANGIOGRAPHY N/A 06/05/2022   Procedure: LEFT HEART CATH AND CORS/GRAFTS ANGIOGRAPHY;  Surgeon: Darron Deatrice LABOR, MD;  Location: [facility];  Service: Cardiovascular;  Laterality: N/A;   OVARIAN CYST REMOVAL  1970   TUBAL LIGATION  1970     FAMILY HISTORY   Family History  Problem Relation Age of Onset   Cancer Father        lung   Heart attack Father 21   Alcohol abuse Father    Arthritis Father    COPD Father    Diabetes Father    Hypertension Father    Breast cancer Sister 29   Arthritis Sister    Cancer Sister    COPD Sister    Drug abuse Sister    Hypertension Sister    Miscarriages / [place] Sister    Thyroid  disease Sister    Arthritis Mother    COPD Mother    Diabetes Mother    Hearing loss Mother    Hypertension Mother    Miscarriages / [place] Mother    Stroke Mother    Alcohol abuse Brother    Arthritis Brother    Hypertension Brother  Arthritis Daughter    Asthma Daughter    Depression Daughter    Hypertension Daughter    Miscarriages / [place] Daughter    Thyroid  disease Daughter    Arthritis Maternal Grandmother    Hypertension Maternal Grandmother    Stroke Maternal Grandmother    Thyroid  disease Maternal Grandmother    Alcohol abuse Maternal Grandfather    Arthritis Maternal Grandfather    Cancer Maternal Grandfather    Depression Maternal Grandfather    Hearing loss Maternal Grandfather    Hypertension Maternal Grandfather    Arthritis Sister    Thyroid  disease Sister    Thyroid  disease Daughter     Hypertension Daughter    Depression Daughter    COPD Daughter    Alcohol abuse Daughter    Arthritis Daughter    Arthritis Daughter    COPD Daughter    Depression Daughter    Hearing loss Daughter    Hypertension Daughter    Thyroid  disease Daughter      SOCIAL HISTORY   Social History   Tobacco Use   Smoking status: Former    Current packs/day: 0.00    Average packs/day: 1 pack/day for 40.0 years (40.0 ttl pk-yrs)    Types: Cigarettes    Start date: 79    Quit date: 2002    Years since quitting: 23.5   Smokeless tobacco: Never  Vaping Use   Vaping status: Never Used  Substance Use Topics   Alcohol use: Not Currently   Drug use: Never     MEDICATIONS    Home Medication:  Current Outpatient Rx   Order #: [id number] Class: OTC   Order #: [id number] Class: Normal   Order #: [id number] Class: Normal   Order #: [id number] Class: Normal   Order #: [id number] Class: OTC   Order #: [id number] Class: Normal   Order #: [id number] Class: Normal   Order #: [id number] Class: Normal   Order #: [id number] Class: OTC   Order #: [id number] Class: Normal   Order #: [id number] Class: Normal   Order #: [id number] Class: Normal   Order #: [id number] Class: Normal   Order #: [id number] Class: Normal   Order #: [id number] Class: Normal   Order #: [id number] Class: Historical Med   Order #: [id number] Class: Historical Med   Order #: [id number] Class: Historical Med   Order #: [id number] Class: Normal   Order #: [id number] Class: Normal   Order #: [id number] Class: Normal   Order #: [id number] Class: Normal   Order #: [id number] Class: Normal   Order #: [id number] Class: No Print   Order #: [id number] Class: Normal   Order #: [id number] Class: Normal    Current Medication:  Current Outpatient Medications:    acetaminophen  (TYLENOL ) 500 MG tablet, Take 1 tablet (500 mg total) by mouth 3 (three) times daily as needed for moderate pain., Disp: , Rfl:    albuterol  (PROVENTIL ) (2.5 MG/3ML) 0.083% nebulizer solution, TAKE 3  MLS (2.5 MG TOTAL) BY NEBULIZATION EVERY 6 (SIX) HOURS AS NEEDED FOR SHORTNESS OF BREATH., Disp: 150 mL, Rfl: 1   albuterol  (VENTOLIN  HFA) 108 (90 Base) MCG/ACT inhaler, INHALE 2 PUFFS INTO THE LUNGS EVERY 4 HOURS AS NEEDED FOR WHEEZING OR SHORTNESS OF BREATH., Disp: 6.7 each, Rfl: 3   arformoterol  (BROVANA ) 15 MCG/2ML NEBU, Take 2 mLs (15 mcg total) by nebulization 2 (two) times daily., Disp: 120 mL, Rfl: 12   aspirin  81 MG chewable tablet, Chew 1 tablet (81 mg total) by mouth daily., Disp: , Rfl:    BREO ELLIPTA  200-25 MCG/ACT AEPB, TAKE 1 PUFF BY MOUTH EVERY DAY, Disp: 180 each, Rfl:  1   budesonide  (PULMICORT ) 0.5 MG/2ML nebulizer solution, Take 2 mLs (0.5 mg total) by nebulization 2 (two) times daily., Disp: 1440 mL, Rfl: 0   carvedilol  (COREG ) 6.25 MG tablet, TAKE 1 TABLET BY MOUTH 2 TIMES DAILY WITH A MEAL., Disp: 180 tablet, Rfl: 3   Cholecalciferol (VITAMIN D3) 25 MCG (1000 UT) capsule, Take 2 capsules (2,000 Units total) by mouth daily., Disp: 30 capsule, Rfl:    clopidogrel  (PLAVIX ) 75 MG tablet, Take 1 tablet (75 mg total) by mouth daily with breakfast., Disp: 90 tablet, Rfl: 3   doxycycline  (VIBRA -TABS) 100 MG tablet, Take 1 tablet (100 mg total) by mouth 2 (two) times daily., Disp: 20 tablet, Rfl: 0   ezetimibe  (ZETIA ) 10 MG tablet, TAKE 1 TABLET BY MOUTH EVERY DAY, Disp: 90 tablet, Rfl: 3   fluticasone  (FLONASE ) 50 MCG/ACT nasal spray, Place 2 sprays into both nostrils daily., Disp: 16 g, Rfl: 12   hydroxyurea  (HYDREA ) 500 MG capsule, Take 3 capsules (1,500 mg total) by mouth daily. May take with food to minimize GI side effects., Disp: 270 capsule, Rfl: 2   ipratropium-albuterol  (DUONEB) 0.5-2.5 (3) MG/3ML SOLN, Take 3 mLs by nebulization every 6 (six) hours as needed., Disp: 360 mL, Rfl: 1   loratadine (CLARITIN) 10 MG tablet, Take 10 mg by mouth daily., Disp: , Rfl:    Multiple Vitamin (MULTIVITAMIN) tablet, Take 1 tablet by mouth daily., Disp: , Rfl:    Multiple Vitamins-Minerals  (PRESERVISION AREDS 2) CAPS, Take 2 each by mouth daily at 12 noon., Disp: , Rfl:    mupirocin  ointment (BACTROBAN ) 2 %, Apply topically  wound at right lower leg and cover with bandage daily until healed., Disp: 22 g, Rfl: 0   nitroGLYCERIN  (NITROSTAT ) 0.4 MG SL tablet, Place 1 tablet (0.4 mg total) under the tongue every 5 (five) minutes as needed for chest pain., Disp: 25 tablet, Rfl: 3   predniSONE  (DELTASONE ) 20 MG tablet, Take 1 tablet (20 mg total) by mouth daily with breakfast. 7  days, Disp: 7 tablet, Rfl: 7   roflumilast  (DALIRESP ) 500 MCG TABS tablet, Take 1 tablet (500 mcg total) by mouth daily., Disp: 30 tablet, Rfl: 3   rosuvastatin  (CRESTOR ) 20 MG tablet, Take 1 tablet (20 mg total) by mouth at bedtime., Disp: 90 tablet, Rfl: 3   sacubitril -valsartan  (ENTRESTO ) 24-26 MG, Take 2 tablets by mouth 2 (two) times daily., Disp: , Rfl:    sertraline  (ZOLOFT ) 100 MG tablet, Take 1 tablet (100 mg total) by mouth daily., Disp: 90 tablet, Rfl: 4   Tiotropium Bromide  Monohydrate (SPIRIVA  RESPIMAT) 2.5 MCG/ACT AERS, Inhale 2 puffs into the lungs daily., Disp: 12 g, Rfl: 3    ALLERGIES   Advair diskus [fluticasone -salmeterol], Levofloxacin, Pollen extract, Qvar [beclomethasone], Singulair [montelukast], and Symbicort [budesonide -formoterol fumarate]   BP 110/60 (BP Location: Right Arm, Patient Position: Sitting, Cuff Size: Large)   Pulse 76   Temp 97.7 F (36.5 C) (Oral)   Ht 5' (1.524 m)   Wt 141 lb (64 kg)   SpO2 91% Comment: on 2L of O2  BMI 27.54 kg/m      Review of Systems: Gen:  Denies  fever, sweats, chills weight loss  HEENT: Denies blurred vision, double vision, ear pain, eye pain, hearing loss, nose bleeds, sore throat Cardiac:  No dizziness, chest pain or heaviness, chest tightness,edema, No JVD Resp:   No cough, -sputum production, +shortness of breath,+wheezing, -hemoptysis,  Other:  All other systems negative   Physical Examination:  General Appearance: No  distress  EYES PERRLA, EOM intact.   NECK Supple, No JVD Pulmonary: normal breath sounds, + wheezing.  CardiovascularNormal S1,S2.  No m/r/g.   Abdomen: Benign, Soft, non-tender. Neurology UE/LE 5/5 strength, no focal deficits Ext pulses intact, cap refill intact ALL OTHER ROS ARE NEGATIVE      CT chest January 2025 reviewed in detail There is evidence of bilateral bronchiectasis This could be contributing to her symptoms      ASSESSMENT/PLAN   83 year old pleasant white female seen today for assessment of COPD with moderate obstructive pulmonary disease based on pulmonary function tests in the last several months had several bouts of COPD exacerbations requiring prednisone  and antibiotics patient was diagnosed with hypoxic respiratory failure and was started on oxygen therapy at last office visit and at this point patient is showing continuing signs and symptoms of chronic bronchitis with  underlying bronchiectasis  Her inspiratory capacity is diminished and unable to take traditional inhaler therapy at this time    Assessment COPD Strong suspicion for progression of moderate COPD Respiratory insufficiency inability to complete pulmonary function testing Poor respiratory and inspiratory capacity Continue nebulized therapy with Pulmicort  and Brovana  DuoNebs every 6 hours Flutter valve 10-15 times per day No evidence of infection Will prescribe prednisone  20 mg daily for 3 days Continue Daliresp  500 mg   Chronic respiratory sufficiency chronic bronchitis and bronchiectasis Patient with ongoing symptoms Recommend chest physiotherapy with Smart vest   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 -Patient benefits from oxygen therapy 2L [place]  -recommend using oxygen as prescribed -patient needs this for survival      MEDICATION ADJUSTMENTS/LABS AND TESTS ORDERED: Continue PULMICORT  NEBS TWICE DAILY CONTINUE BROVANA  NEBS TWICE DAILY Continue Daliresp  FLUTTER VALVE USE  10 times per day Continue oxygen SMART VEST therapy pending Avoid Allergens and Irritants Avoid secondhand smoke Avoid SICK contacts Recommend  Masking  when appropriate Recommend Keep up-to-date with vaccinations    CURRENT MEDICATIONS REVIEWED AT LENGTH WITH PATIENT TODAY   Patient  satisfied with Plan of action and management. All questions answered   Follow up 3 months   I spent a total of 42 minutes dedicated to the care of this patient on the date of this encounter to include pre-visit review of records, face-to-face time with the patient discussing conditions above, post visit ordering of testing, clinical documentation with the electronic health record, making appropriate referrals as documented, and communicating necessary information to the patient's healthcare team.    The Patient requires high complexity decision making for assessment and support, frequent evaluation and titration of therapies, application of advanced monitoring technologies and extensive interpretation of multiple databases.  Patient satisfied with Plan of action and management. All questions answered    Nickolas Alm Cellar, M.D.  [facility]  Medical Director [facility] Medical Director [facility] Cardio-Pulmonary Department

## 2023-12-02 DIAGNOSIS — H90A32 Mixed conductive and sensorineural hearing loss, unilateral, left ear with restricted hearing on the contralateral side: Secondary | ICD-10-CM | POA: Diagnosis not present

## 2023-12-02 DIAGNOSIS — H93292 Other abnormal auditory perceptions, left ear: Secondary | ICD-10-CM | POA: Diagnosis not present

## 2023-12-02 DIAGNOSIS — H6993 Unspecified Eustachian tube disorder, bilateral: Secondary | ICD-10-CM | POA: Diagnosis not present

## 2023-12-10 ENCOUNTER — Ambulatory Visit: Payer: Self-pay | Admitting: Internal Medicine

## 2023-12-10 DIAGNOSIS — R0602 Shortness of breath: Secondary | ICD-10-CM

## 2023-12-10 DIAGNOSIS — J449 Chronic obstructive pulmonary disease, unspecified: Secondary | ICD-10-CM

## 2023-12-10 DIAGNOSIS — J471 Bronchiectasis with (acute) exacerbation: Secondary | ICD-10-CM

## 2023-12-10 MED ORDER — DOXYCYCLINE HYCLATE 100 MG PO TABS: 100.0000 mg | ORAL_TABLET | Freq: Two times a day (BID) | ORAL | 0 refills | Status: DC

## 2023-12-10 MED ORDER — PREDNISONE 20 MG PO TABS: 20.0000 mg | ORAL_TABLET | Freq: Every day | ORAL | 7 refills | Status: DC

## 2023-12-10 NOTE — Telephone Encounter (Signed)
 I have sent prednisone  and doxycycline  to her pharmacy.  If her symptoms fail to improve or worsen in any way she needs to be seen in the ED.  Her oxygen saturations are at baseline on 2 L/min.

## 2023-12-10 NOTE — Telephone Encounter (Signed)
 I have notified the patient's daughter (DPR).   Nothing further needed.

## 2023-12-10 NOTE — Telephone Encounter (Addendum)
 Patient and patient's daughter would like for a prescription of Prednisone  to be sent to their pharmacy and if patient gets any worse she will go to the Emergency Room for further evaluation  FYI Only or Action Required?: Action required by provider: update on patient condition and Requesting prescription for Prednisone .  Patient is followed in Pulmonology for COPD & chronic respiratory failure with hypoxia [facility]), last seen on 11/20/2023 by Nancy Scrivener, MD.  Called Nurse Triage reporting Shortness of Breath.  Symptoms began about 5 days ago.  Interventions attempted: Prescription medications: Albuterol  inhaler and nebulizer, Pulmicort , Daliresp  , Home oxygen use, and Increased rest.  Symptoms are: gradually worsening.  Triage Disposition: See HCP Within 4 Hours (Or PCP Triage)  Patient/caregiver understands and will follow disposition?: No, wishes to speak with PCP   As above. Patient's chart reviewed, prescribed prednisone  and doxycycline  patient has likely bronchiectasis with exacerbation.  If no improvement in symptoms or worsening of symptoms needs to be seen in the ED.  Nancy Nancy Sanders, MD [facility] PCCM Nancy Merritt                      Copied from CRM [id number]. Topic: Clinical - Red Word Triage >> Dec 10, 2023  1:45 PM Nancy Merritt wrote: Red Word that prompted transfer to Nurse Triage: Oxygen is staying low even with supplemental oxygen and shortness of breath. Daughter Nancy Merritt speaking (DPR) Reason for Disposition  [1] Longstanding difficulty breathing (e.g., CHF, COPD, emphysema) AND [2] WORSE than normal  Answer Assessment - Initial Assessment Questions Patient seen in office recently and didn't want to try Prednisone  at that time However, she is wanting to try Prednisone   now Patient's daughter called in and this RN spoke with the daughter and also the patient Patient states she is feeling worse than normal which  warranted for her to be seen and evaluated in 4 hours--However, they wanted to try a prescription of Prednisone  first and if that doesn't help then they will have her evaluated They are advised that if anything worsens to go to the Emergency Room Daughter is aware of precautions and things to warrant an Emergency Room visit Patient was at 92% on 2 liters during Triage with this RN and able to speak without gasping. No audible wheezing noted over the phone at this time either.      [name] Triage - Initial Assessment Questions Chief Complaint (e.g., cough, sob, wheezing, fever, chills, sweat or additional symptoms) *Go to specific symptom protocol after initial questions. Shortness of breath, low oxygen saturation, wheezing  How long have symptoms been present? 5 days  Have you tested for COVID or Flu? Note: If not, ask patient if a home test can be taken. If so, instruct patient to call back for positive results. No  MEDICINES:   Have you used any OTC meds to help with symptoms? No If yes, ask What medications? N/A  Have you used your inhalers/maintenance medication? Yes If yes, What medications? Pulmicort --Take 2 mLs (0.5 mg total) by nebulization 2 (two) times daily.  Albuterol  Nebulizer--TAKE 3 MLS (2.5 MG TOTAL) BY NEBULIZATION EVERY 6 (SIX) HOURS AS NEEDED FOR SHORTNESS OF BREATH.  Albuterol  Inhaler--INHALE 2 PUFFS INTO THE LUNGS EVERY 4 HOURS AS NEEDED FOR WHEEZING OR SHORTNESS OF BREATH.  Daliresp --Take 1 tablet (500 mcg total) by mouth daily   If inhaler, ask How many puffs and how often? Note: Review instructions on medication in the chart. Pulmicort --Take 2 mLs (  0.5 mg total) by nebulization 2 (two) times daily.  Albuterol  Nebulizer--TAKE 3 MLS (2.5 MG TOTAL) BY NEBULIZATION EVERY 6 (SIX) HOURS AS NEEDED FOR SHORTNESS OF BREATH.  Albuterol  Inhaler--INHALE 2 PUFFS INTO THE LUNGS EVERY 4 HOURS AS NEEDED FOR WHEEZING OR SHORTNESS OF BREATH.   Daliresp --Take 1 tablet (500 mcg total) by mouth daily   OXYGEN: Do you wear supplemental oxygen? Yes If yes, How many liters are you supposed to use? 2%  Do you monitor your oxygen levels? Yes If yes, What is your reading (oxygen level) today? 92% on 2 liters  What is your usual oxygen saturation reading?  (Note: Pulmonary O2 sats should be 90% or greater) ------     3. PATTERN Does the difficult breathing come and go, or has it been constant since it started?      constant 4. SEVERITY: How bad is your breathing? (e.g., mild, moderate, severe)      Shortness of breath 5. RECURRENT SYMPTOM: Have you had difficulty breathing before? If Yes, ask: When was the last time? and What happened that time?      ------- 6. CARDIAC HISTORY: Do you have any history of heart disease? (e.g., heart attack, angina, bypass surgery, angioplasty)      ----- 7. LUNG HISTORY: Do you have any history of lung disease?  (e.g., pulmonary embolus, asthma, emphysema)     COPD 8. CAUSE: What do you think is causing the breathing problem?      ----- 9. OTHER SYMPTOMS: Do you have any other symptoms? (e.g., chest pain, cough, dizziness, fever, runny nose)      Shortness of breath at rest and worse on exertion  Protocols used: Breathing Difficulty-A-AH

## 2023-12-18 ENCOUNTER — Inpatient Hospital Stay (HOSPITAL_BASED_OUTPATIENT_CLINIC_OR_DEPARTMENT_OTHER): Admitting: Oncology

## 2023-12-18 ENCOUNTER — Inpatient Hospital Stay: Attending: Oncology

## 2023-12-18 ENCOUNTER — Encounter: Payer: Self-pay | Admitting: Oncology

## 2023-12-18 VITALS — BP 128/74 | HR 84 | Temp 97.8°F | Resp 16 | Ht 60.0 in | Wt 136.0 lb

## 2023-12-18 DIAGNOSIS — Z7982 Long term (current) use of aspirin: Secondary | ICD-10-CM | POA: Insufficient documentation

## 2023-12-18 DIAGNOSIS — Z8616 Personal history of COVID-19: Secondary | ICD-10-CM | POA: Insufficient documentation

## 2023-12-18 DIAGNOSIS — D75839 Thrombocytosis, unspecified: Secondary | ICD-10-CM | POA: Diagnosis not present

## 2023-12-18 DIAGNOSIS — Z87891 Personal history of nicotine dependence: Secondary | ICD-10-CM | POA: Insufficient documentation

## 2023-12-18 DIAGNOSIS — R0602 Shortness of breath: Secondary | ICD-10-CM | POA: Insufficient documentation

## 2023-12-18 DIAGNOSIS — D7589 Other specified diseases of blood and blood-forming organs: Secondary | ICD-10-CM | POA: Diagnosis not present

## 2023-12-18 DIAGNOSIS — Z809 Family history of malignant neoplasm, unspecified: Secondary | ICD-10-CM | POA: Insufficient documentation

## 2023-12-18 DIAGNOSIS — Z8589 Personal history of malignant neoplasm of other organs and systems: Secondary | ICD-10-CM | POA: Diagnosis not present

## 2023-12-18 DIAGNOSIS — Z803 Family history of malignant neoplasm of breast: Secondary | ICD-10-CM | POA: Diagnosis not present

## 2023-12-18 DIAGNOSIS — I10 Essential (primary) hypertension: Secondary | ICD-10-CM | POA: Insufficient documentation

## 2023-12-18 DIAGNOSIS — D473 Essential (hemorrhagic) thrombocythemia: Secondary | ICD-10-CM | POA: Diagnosis not present

## 2023-12-18 DIAGNOSIS — Z853 Personal history of malignant neoplasm of breast: Secondary | ICD-10-CM | POA: Insufficient documentation

## 2023-12-18 DIAGNOSIS — Z9981 Dependence on supplemental oxygen: Secondary | ICD-10-CM | POA: Diagnosis not present

## 2023-12-18 DIAGNOSIS — R233 Spontaneous ecchymoses: Secondary | ICD-10-CM | POA: Diagnosis not present

## 2023-12-18 DIAGNOSIS — Z801 Family history of malignant neoplasm of trachea, bronchus and lung: Secondary | ICD-10-CM | POA: Insufficient documentation

## 2023-12-18 LAB — CBC WITH DIFFERENTIAL/PLATELET
Abs Immature Granulocytes: 0.03 K/uL (ref 0.00–0.07)
Basophils Absolute: 0 K/uL (ref 0.0–0.1)
Basophils Relative: 0 %
Eosinophils Absolute: 0.1 K/uL (ref 0.0–0.5)
Eosinophils Relative: 1 %
HCT: 39.3 % (ref 36.0–46.0)
Hemoglobin: 13.3 g/dL (ref 12.0–15.0)
Immature Granulocytes: 0 %
Lymphocytes Relative: 13 %
Lymphs Abs: 1.2 K/uL (ref 0.7–4.0)
MCH: 35 pg — ABNORMAL HIGH (ref 26.0–34.0)
MCHC: 33.8 g/dL (ref 30.0–36.0)
MCV: 103.4 fL — ABNORMAL HIGH (ref 80.0–100.0)
Monocytes Absolute: 0.3 K/uL (ref 0.1–1.0)
Monocytes Relative: 4 %
Neutro Abs: 7.3 K/uL (ref 1.7–7.7)
Neutrophils Relative %: 82 %
Platelets: 758 K/uL — ABNORMAL HIGH (ref 150–400)
RBC: 3.8 MIL/uL — ABNORMAL LOW (ref 3.87–5.11)
RDW: 16.3 % — ABNORMAL HIGH (ref 11.5–15.5)
WBC: 8.8 K/uL (ref 4.0–10.5)
nRBC: 0 % (ref 0.0–0.2)

## 2023-12-18 NOTE — Progress Notes (Unsigned)
 Patient has been having bruises appear on her skin ever since starting on the blood thinners. She is feeling fatigued.

## 2023-12-18 NOTE — Progress Notes (Unsigned)
  [facility]  [phone]) [phone] [phone]  ID: Melannie Metzner OB: 07-27-1940  MR#: [id number]  RDW#:[id number]  Patient Care Team: Rilla Baller, MD as PCP - General (Family Medicine) Hobart Powell BRAVO, MD (Inactive) as PCP - Cardiology (Cardiology) Kassie Acquanetta Bradley, MD as Consulting Physician (Pulmonary Disease) Fate Morna SAILOR, [facility] (Inactive) as Pharmacist (Pharmacist) Jacobo Evalene PARAS, MD as Consulting Physician (Oncology) Portia Fireman, OD (Optometry) Alvia Norleen BIRCH, MD as Consulting Physician (Ophthalmology) Isaiah Scrivener, MD as Consulting Physician (Pulmonary Disease)  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  INTERVAL HISTORY: Patient returns to clinic today for repeat laboratory work, further evaluation, and continuation of Hydrea .  She currently feels well and is asymptomatic.  She has chronic shortness of breath and requires oxygen.  She continues to tolerate 1500 mg Hydrea  without significant side effects.  She does not complain of any weakness or fatigue.  She has no neurologic complaints.  She denies fevers.  She has a good appetite and denies weight loss.  She has no chest pain, cough, or hemoptysis.  She denies any nausea, vomiting, constipation, or diarrhea.  She has no urinary complaints.  Patient offers no further specific complaints today.  REVIEW OF SYSTEMS:   Review of Systems  Constitutional: Negative.  Negative for fever, malaise/fatigue and weight loss.  Respiratory: Negative.  Negative for cough, hemoptysis and shortness of breath.   Cardiovascular: Negative.  Negative for chest pain and leg swelling.  Gastrointestinal: Negative.  Negative for abdominal pain.  Genitourinary: Negative.  Negative for dysuria.  Musculoskeletal: Negative.  Negative for back pain and myalgias.  Skin: Negative.  Negative for rash.  Neurological: Negative.  Negative for dizziness, focal weakness, weakness and  headaches.  Psychiatric/Behavioral: Negative.  The patient is not nervous/anxious.     As per HPI. Otherwise, a complete review of systems is negative.  PAST MEDICAL HISTORY: Past Medical History:  Diagnosis Date  . Arthritis   . Asthma   . Basal cell carcinoma 08/18/2023   right medial pretibial, [facility]  . Cancer (HCC) 1990  . COVID-19 06/14/2020  . Depression   . Hx of breast cancer 04/07/2018   1990. No screening in last 25 years    PAST SURGICAL HISTORY: Past Surgical History:  Procedure Laterality Date  . BREAST SURGERY Left 1990  . CORONARY ARTERY BYPASS GRAFT    . JOINT REPLACEMENT Right    Hip  . LEFT HEART CATH AND CORS/GRAFTS ANGIOGRAPHY N/A 06/05/2022   Procedure: LEFT HEART CATH AND CORS/GRAFTS ANGIOGRAPHY;  Surgeon: Darron Deatrice LABOR, MD;  Location: [facility]AS[facility];  Service: Cardiovascular;  Laterality: N/A;  . OVARIAN CYST REMOVAL  1970  . TUBAL LIGATION  1970    FAMILY HISTORY: Family History  Problem Relation Age of Onset  . Cancer Father        lung  . Heart attack Father 80  . Alcohol abuse Father   . Arthritis Father   . COPD Father   . Diabetes Father   . Hypertension Father   . Breast cancer Sister 37  . Arthritis Sister   . Cancer Sister   . COPD Sister   . Drug abuse Sister   . Hypertension Sister   . Miscarriages / Stillbirths Sister   . Thyroid  disease Sister   . Arthritis Mother   . COPD Mother   . Diabetes Mother   . Hearing loss Mother   . Hypertension Mother   . Miscarriages / [place] Mother   .  Stroke Mother   . Alcohol abuse Brother   . Arthritis Brother   . Hypertension Brother   . Arthritis Daughter   . Asthma Daughter   . Depression Daughter   . Hypertension Daughter   . Miscarriages / [place] Daughter   . Thyroid  disease Daughter   . Arthritis Maternal Grandmother   . Hypertension Maternal Grandmother   . Stroke Maternal Grandmother   . Thyroid  disease Maternal Grandmother   . Alcohol abuse Maternal  Grandfather   . Arthritis Maternal Grandfather   . Cancer Maternal Grandfather   . Depression Maternal Grandfather   . Hearing loss Maternal Grandfather   . Hypertension Maternal Grandfather   . Arthritis Sister   . Thyroid  disease Sister   . Thyroid  disease Daughter   . Hypertension Daughter   . Depression Daughter   . COPD Daughter   . Alcohol abuse Daughter   . Arthritis Daughter   . Arthritis Daughter   . COPD Daughter   . Depression Daughter   . Hearing loss Daughter   . Hypertension Daughter   . Thyroid  disease Daughter     ADVANCED DIRECTIVES (Y/N):  N  HEALTH MAINTENANCE: Social History   Tobacco Use  . Smoking status: Former    Current packs/day: 0.00    Average packs/day: 1 pack/day for 40.0 years (40.0 ttl pk-yrs)    Types: Cigarettes    Start date: 15    Quit date: 2002    Years since quitting: 23.5  . Smokeless tobacco: Never  Vaping Use  . Vaping status: Never Used  Substance Use Topics  . Alcohol use: Not Currently  . Drug use: Never     Colonoscopy:  PAP:  Bone density:  Lipid panel:  Allergies  Allergen Reactions  . Advair Diskus [Fluticasone -Salmeterol] Shortness Of Breath    As of 09/01/2014. Pt can take Breo  . Levofloxacin Other (See Comments)    Nausea, brain fog, depression, as of 12/12/14  . Pollen Extract Cough  . Qvar [Beclomethasone] Other (See Comments)    Lungs closing-as of 05/03/2015  . Singulair [Montelukast] Shortness Of Breath  . Symbicort [Budesonide -Formoterol Fumarate] Other (See Comments)    Hard time breathing as of 10/31/2014.    Current Outpatient Medications  Medication Sig Dispense Refill  . acetaminophen  (TYLENOL ) 500 MG tablet Take 1 tablet (500 mg total) by mouth 3 (three) times daily as needed for moderate pain.    . albuterol  (PROVENTIL ) (2.5 MG/3ML) 0.083% nebulizer solution TAKE 3 MLS (2.5 MG TOTAL) BY NEBULIZATION EVERY 6 (SIX) HOURS AS NEEDED FOR SHORTNESS OF BREATH. 150 mL 1  . albuterol  (VENTOLIN   HFA) 108 (90 Base) MCG/ACT inhaler INHALE 2 PUFFS INTO THE LUNGS EVERY 4 HOURS AS NEEDED FOR WHEEZING OR SHORTNESS OF BREATH. 6.7 each 3  . arformoterol  (BROVANA ) 15 MCG/2ML NEBU Take 2 mLs (15 mcg total) by nebulization 2 (two) times daily. 120 mL 12  . aspirin  81 MG chewable tablet Chew 1 tablet (81 mg total) by mouth daily.    . BREO ELLIPTA  200-25 MCG/ACT AEPB TAKE 1 PUFF BY MOUTH EVERY DAY 180 each 1  . budesonide  (PULMICORT ) 0.5 MG/2ML nebulizer solution Take 2 mLs (0.5 mg total) by nebulization 2 (two) times daily. 1440 mL 0  . carvedilol  (COREG ) 6.25 MG tablet TAKE 1 TABLET BY MOUTH 2 TIMES DAILY WITH A MEAL. 180 tablet 3  . Cholecalciferol (VITAMIN D3) 25 MCG (1000 UT) capsule Take 2 capsules (2,000 Units total) by mouth daily. 30 capsule   .  clopidogrel  (PLAVIX ) 75 MG tablet Take 1 tablet (75 mg total) by mouth daily with breakfast. 90 tablet 3  . doxycycline  (VIBRA -TABS) 100 MG tablet Take 1 tablet (100 mg total) by mouth 2 (two) times daily. 20 tablet 0  . ezetimibe  (ZETIA ) 10 MG tablet TAKE 1 TABLET BY MOUTH EVERY DAY 90 tablet 3  . fluticasone  (FLONASE ) 50 MCG/ACT nasal spray Place 2 sprays into both nostrils daily. 16 g 12  . hydroxyurea  (HYDREA ) 500 MG capsule Take 3 capsules (1,500 mg total) by mouth daily. May take with food to minimize GI side effects. 270 capsule 2  . ipratropium-albuterol  (DUONEB) 0.5-2.5 (3) MG/3ML SOLN Take 3 mLs by nebulization every 6 (six) hours as needed. 360 mL 1  . loratadine (CLARITIN) 10 MG tablet Take 10 mg by mouth daily.    . Multiple Vitamin (MULTIVITAMIN) tablet Take 1 tablet by mouth daily.    . Multiple Vitamins-Minerals (PRESERVISION AREDS 2) CAPS Take 2 each by mouth daily at 12 noon.    . mupirocin  ointment (BACTROBAN ) 2 % Apply topically  wound at right lower leg and cover with bandage daily until healed. 22 g 0  . nitroGLYCERIN  (NITROSTAT ) 0.4 MG SL tablet Place 1 tablet (0.4 mg total) under the tongue every 5 (five) minutes as needed for  chest pain. 25 tablet 3  . predniSONE  (DELTASONE ) 20 MG tablet Take 1 tablet (20 mg total) by mouth daily with breakfast. 7  days 7 tablet 7  . roflumilast  (DALIRESP ) 500 MCG TABS tablet Take 1 tablet (500 mcg total) by mouth daily. 30 tablet 3  . rosuvastatin  (CRESTOR ) 20 MG tablet Take 1 tablet (20 mg total) by mouth at bedtime. 90 tablet 3  . sacubitril -valsartan  (ENTRESTO ) 24-26 MG Take 2 tablets by mouth 2 (two) times daily.    . sertraline  (ZOLOFT ) 100 MG tablet Take 1 tablet (100 mg total) by mouth daily. 90 tablet 4  . Tiotropium Bromide  Monohydrate (SPIRIVA  RESPIMAT) 2.5 MCG/ACT AERS Inhale 2 puffs into the lungs daily. 12 g 3   No current facility-administered medications for this visit.    OBJECTIVE: Vitals:   12/18/23 1408  BP: 128/74  Pulse: 84  Resp: 16  Temp: 97.8 F (36.6 C)  SpO2: 96%     Body mass index is 26.56 kg/m.    ECOG FS:0 - Asymptomatic  General: Well-developed, well-nourished, no acute distress. Eyes: Pink conjunctiva, anicteric sclera. HEENT: Normocephalic, moist mucous membranes. Lungs: No audible wheezing or coughing. Heart: Regular rate and rhythm. Abdomen: Soft, nontender, no obvious distention. Musculoskeletal: No edema, cyanosis, or clubbing. Neuro: Alert, answering all questions appropriately. Cranial nerves grossly intact. Skin: No rashes or petechiae noted. Psych: Normal affect.  LAB RESULTS:  Lab Results  Component Value Date   NA 138 08/27/2023   K 3.9 08/27/2023   CL 105 08/27/2023   CO2 26 08/27/2023   GLUCOSE 127 (H) 08/27/2023   BUN 19 08/27/2023   CREATININE 1.16 (H) 08/27/2023   CALCIUM  9.7 08/27/2023   PROT 6.5 08/27/2023   ALBUMIN 3.3 (L) 08/27/2023   AST 18 08/27/2023   ALT 15 08/27/2023   ALKPHOS 79 08/27/2023   BILITOT 0.4 08/27/2023   GFRNONAA 46 (L) 08/27/2023   GFRAA >60 01/07/2020    Lab Results  Component Value Date   WBC 8.8 12/18/2023   NEUTROABS 7.3 12/18/2023   HGB 13.3 12/18/2023   HCT 39.3  12/18/2023   MCV 103.4 (H) 12/18/2023   PLT 758 (H) 12/18/2023  STUDIES: No results found.   ASSESSMENT: Essential thrombocytosis with CALR mutation.  PLAN:    Essential thrombocytosis with CALR mutation: Patient's platelet count has trended up and is now 786.  Will not change dose of Hydrea  at this time. Continue Hydrea  1500 mg daily.  We discussed the possibility that if her platelet count continues to trend up either increasing her dose of Hydrea  or possibly switching to anagrelide.  No further interventions are needed.  Return to clinic in 6 weeks for laboratory work only and then in 3 months for laboratory work and further evaluation. Macrocytosis: Likely secondary Hydrea .  Monitor. Cardiac disease: Being medically managed.  Follow-up with her cardiologist as indicated. Hypertension: Patient's blood pressure is moderately elevated today.  Continue monitoring and treatment per primary care.   Patient expressed understanding and was in agreement with this plan. She also understands that She can call clinic at any time with any questions, concerns, or complaints.    Evalene JINNY Reusing, MD   12/18/2023 2:22 PM

## 2023-12-21 DIAGNOSIS — J479 Bronchiectasis, uncomplicated: Secondary | ICD-10-CM | POA: Diagnosis not present

## 2023-12-23 DIAGNOSIS — H90A32 Mixed conductive and sensorineural hearing loss, unilateral, left ear with restricted hearing on the contralateral side: Secondary | ICD-10-CM | POA: Diagnosis not present

## 2023-12-23 DIAGNOSIS — H6993 Unspecified Eustachian tube disorder, bilateral: Secondary | ICD-10-CM | POA: Diagnosis not present

## 2023-12-27 ENCOUNTER — Emergency Department

## 2023-12-27 ENCOUNTER — Other Ambulatory Visit: Payer: Self-pay

## 2023-12-27 ENCOUNTER — Observation Stay
Admission: EM | Admit: 2023-12-27 | Discharge: 2023-12-29 | Disposition: A | Discharge status: Home / Self Care | Attending: Internal Medicine | Admitting: Internal Medicine

## 2023-12-27 DIAGNOSIS — R0602 Shortness of breath: Secondary | ICD-10-CM | POA: Diagnosis not present

## 2023-12-27 DIAGNOSIS — Z7982 Long term (current) use of aspirin: Secondary | ICD-10-CM | POA: Diagnosis not present

## 2023-12-27 DIAGNOSIS — E210 Primary hyperparathyroidism: Secondary | ICD-10-CM | POA: Diagnosis present

## 2023-12-27 DIAGNOSIS — I10 Essential (primary) hypertension: Secondary | ICD-10-CM | POA: Diagnosis not present

## 2023-12-27 DIAGNOSIS — J449 Chronic obstructive pulmonary disease, unspecified: Secondary | ICD-10-CM | POA: Diagnosis not present

## 2023-12-27 DIAGNOSIS — I2510 Atherosclerotic heart disease of native coronary artery without angina pectoris: Secondary | ICD-10-CM | POA: Diagnosis not present

## 2023-12-27 DIAGNOSIS — D473 Essential (hemorrhagic) thrombocythemia: Secondary | ICD-10-CM | POA: Diagnosis not present

## 2023-12-27 DIAGNOSIS — E785 Hyperlipidemia, unspecified: Secondary | ICD-10-CM | POA: Insufficient documentation

## 2023-12-27 DIAGNOSIS — Z87891 Personal history of nicotine dependence: Secondary | ICD-10-CM | POA: Diagnosis not present

## 2023-12-27 DIAGNOSIS — I509 Heart failure, unspecified: Secondary | ICD-10-CM | POA: Insufficient documentation

## 2023-12-27 DIAGNOSIS — Z8673 Personal history of transient ischemic attack (TIA), and cerebral infarction without residual deficits: Secondary | ICD-10-CM | POA: Diagnosis not present

## 2023-12-27 DIAGNOSIS — T50915A Adverse effect of multiple unspecified drugs, medicaments and biological substances, initial encounter: Secondary | ICD-10-CM | POA: Diagnosis not present

## 2023-12-27 DIAGNOSIS — F331 Major depressive disorder, recurrent, moderate: Secondary | ICD-10-CM | POA: Diagnosis present

## 2023-12-27 DIAGNOSIS — I2542 Coronary artery dissection: Secondary | ICD-10-CM | POA: Diagnosis not present

## 2023-12-27 DIAGNOSIS — Z79899 Other long term (current) drug therapy: Secondary | ICD-10-CM

## 2023-12-27 DIAGNOSIS — J441 Chronic obstructive pulmonary disease with (acute) exacerbation: Principal | ICD-10-CM | POA: Diagnosis present

## 2023-12-27 HISTORY — DX: Chronic obstructive pulmonary disease, unspecified: J44.9

## 2023-12-27 HISTORY — DX: Heart failure, unspecified: I50.9

## 2023-12-27 HISTORY — DX: Essential (primary) hypertension: I10

## 2023-12-27 HISTORY — DX: Atherosclerotic heart disease of native coronary artery without angina pectoris: I25.10

## 2023-12-27 LAB — BRAIN NATRIURETIC PEPTIDE: B Natriuretic Peptide: 99 pg/mL (ref 0.0–100.0)

## 2023-12-27 LAB — CBC
HCT: 36.2 % (ref 36.0–46.0)
Hemoglobin: 12.2 g/dL (ref 12.0–15.0)
MCH: 35.1 pg — ABNORMAL HIGH (ref 26.0–34.0)
MCHC: 33.7 g/dL (ref 30.0–36.0)
MCV: 104 fL — ABNORMAL HIGH (ref 80.0–100.0)
Platelets: 703 K/uL — ABNORMAL HIGH (ref 150–400)
RBC: 3.48 MIL/uL — ABNORMAL LOW (ref 3.87–5.11)
RDW: 16.3 % — ABNORMAL HIGH (ref 11.5–15.5)
WBC: 6.3 K/uL (ref 4.0–10.5)
nRBC: 0 % (ref 0.0–0.2)

## 2023-12-27 LAB — BASIC METABOLIC PANEL WITH GFR
Anion gap: 7 (ref 5–15)
BUN: 19 mg/dL (ref 8–23)
CO2: 28 mmol/L (ref 22–32)
Calcium: 9.9 mg/dL (ref 8.9–10.3)
Chloride: 104 mmol/L (ref 98–111)
Creatinine, Ser: 0.61 mg/dL (ref 0.44–1.00)
GFR, Estimated: >60 mL/min (ref >60)
Glucose, Bld: 146 mg/dL — ABNORMAL HIGH (ref 70–99)
Potassium: 3.1 mmol/L — ABNORMAL LOW (ref 3.5–5.1)
Sodium: 139 mmol/L (ref 135–145)

## 2023-12-27 LAB — MAGNESIUM: Magnesium: 2 mg/dL (ref 1.7–2.4)

## 2023-12-27 MED ORDER — EZETIMIBE 10 MG PO TABS
10.0000 mg | ORAL_TABLET | Freq: Every day | ORAL | Status: DC
  Administered 2023-12-27 – 2023-12-29 (×4): 10 mg via ORAL
  Filled 2023-12-27 (×3): qty 1

## 2023-12-27 MED ORDER — SACUBITRIL-VALSARTAN 24-26 MG PO TABS
2.0000 | ORAL_TABLET | Freq: Two times a day (BID) | ORAL | Status: DC
  Administered 2023-12-27 – 2023-12-29 (×5): 2 via ORAL
  Filled 2023-12-27 (×5): qty 2

## 2023-12-27 MED ORDER — ARFORMOTEROL TARTRATE 15 MCG/2ML IN NEBU
15.0000 ug | INHALATION_SOLUTION | Freq: Two times a day (BID) | RESPIRATORY_TRACT | Status: DC
  Administered 2023-12-28 – 2023-12-29 (×4): 15 ug via RESPIRATORY_TRACT
  Filled 2023-12-27 (×5): qty 2

## 2023-12-27 MED ORDER — HYDROXYUREA 500 MG PO CAPS
1500.0000 mg | ORAL_CAPSULE | Freq: Every day | ORAL | Status: DC
  Administered 2023-12-27 – 2023-12-29 (×4): 1500 mg via ORAL
  Filled 2023-12-27 (×4): qty 3

## 2023-12-27 MED ORDER — SENNOSIDES-DOCUSATE SODIUM 8.6-50 MG PO TABS
1.0000 | ORAL_TABLET | Freq: Every evening | ORAL | Status: DC | PRN
Start: 2023-12-27 — End: 2023-12-29

## 2023-12-27 MED ORDER — NITROGLYCERIN 0.4 MG SL SUBL: 0.4000 mg | SUBLINGUAL_TABLET | SUBLINGUAL | Status: DC | PRN

## 2023-12-27 MED ORDER — ROSUVASTATIN CALCIUM 10 MG PO TABS
20.0000 mg | ORAL_TABLET | Freq: Every day | ORAL | Status: DC
  Administered 2023-12-27 – 2023-12-28 (×2): 20 mg via ORAL
  Filled 2023-12-27: qty 1
  Filled 2023-12-27 (×2): qty 2

## 2023-12-27 MED ORDER — POTASSIUM CHLORIDE CRYS ER 20 MEQ PO TBCR
40.0000 meq | EXTENDED_RELEASE_TABLET | Freq: Once | ORAL | Status: AC
  Administered 2023-12-27: 40 meq via ORAL
  Filled 2023-12-27: qty 2

## 2023-12-27 MED ORDER — BUDESONIDE 0.5 MG/2ML IN SUSP
0.5000 mg | Freq: Two times a day (BID) | RESPIRATORY_TRACT | Status: DC
  Administered 2023-12-27 – 2023-12-28 (×2): 0.5 mg via RESPIRATORY_TRACT
  Filled 2023-12-27 (×2): qty 2

## 2023-12-27 MED ORDER — ALBUTEROL SULFATE (2.5 MG/3ML) 0.083% IN NEBU: 2.5000 mg | INHALATION_SOLUTION | RESPIRATORY_TRACT | Status: DC | PRN

## 2023-12-27 MED ORDER — VITAMIN D 25 MCG (1000 UNIT) PO TABS
2000.0000 [IU] | ORAL_TABLET | Freq: Every day | ORAL | Status: DC
  Administered 2023-12-27 – 2023-12-29 (×4): 2000 [IU] via ORAL
  Filled 2023-12-27 (×3): qty 2

## 2023-12-27 MED ORDER — ONDANSETRON HCL 4 MG/2ML IJ SOLN: 4.0000 mg | Freq: Four times a day (QID) | INTRAMUSCULAR | Status: DC | PRN

## 2023-12-27 MED ORDER — IPRATROPIUM-ALBUTEROL 0.5-2.5 (3) MG/3ML IN SOLN
6.0000 mL | Freq: Once | RESPIRATORY_TRACT | Status: AC
  Administered 2023-12-27: 6 mL via RESPIRATORY_TRACT
  Filled 2023-12-27: qty 6

## 2023-12-27 MED ORDER — PROSIGHT PO TABS
2.0000 | ORAL_TABLET | Freq: Every day | ORAL | Status: DC
  Administered 2023-12-28 – 2023-12-29 (×3): 2 via ORAL
  Filled 2023-12-27 (×2): qty 2

## 2023-12-27 MED ORDER — HYDRALAZINE HCL 20 MG/ML IJ SOLN: 5.0000 mg | Freq: Four times a day (QID) | INTRAMUSCULAR | Status: DC | PRN

## 2023-12-27 MED ORDER — IPRATROPIUM-ALBUTEROL 0.5-2.5 (3) MG/3ML IN SOLN
3.0000 mL | Freq: Four times a day (QID) | RESPIRATORY_TRACT | Status: DC
  Administered 2023-12-27 – 2023-12-28 (×2): 3 mL via RESPIRATORY_TRACT
  Filled 2023-12-27 (×2): qty 3

## 2023-12-27 MED ORDER — ACETAMINOPHEN 650 MG RE SUPP: 650.0000 mg | Freq: Four times a day (QID) | RECTAL | Status: DC | PRN

## 2023-12-27 MED ORDER — LORATADINE 10 MG PO TABS
10.0000 mg | ORAL_TABLET | Freq: Every day | ORAL | Status: DC
  Administered 2023-12-27 – 2023-12-29 (×4): 10 mg via ORAL
  Filled 2023-12-27 (×3): qty 1

## 2023-12-27 MED ORDER — ACETAMINOPHEN 325 MG PO TABS
650.0000 mg | ORAL_TABLET | Freq: Four times a day (QID) | ORAL | Status: DC | PRN
Start: 2023-12-27 — End: 2023-12-29

## 2023-12-27 MED ORDER — CLOPIDOGREL BISULFATE 75 MG PO TABS
75.0000 mg | ORAL_TABLET | Freq: Every day | ORAL | Status: DC
  Administered 2023-12-28 – 2023-12-29 (×3): 75 mg via ORAL
  Filled 2023-12-27 (×2): qty 1

## 2023-12-27 MED ORDER — CARVEDILOL 6.25 MG PO TABS
6.2500 mg | ORAL_TABLET | Freq: Two times a day (BID) | ORAL | Status: DC
  Administered 2023-12-27 – 2023-12-29 (×5): 6.25 mg via ORAL
  Filled 2023-12-27 (×4): qty 1

## 2023-12-27 MED ORDER — ROFLUMILAST 500 MCG PO TABS
500.0000 ug | ORAL_TABLET | Freq: Every day | ORAL | Status: DC
  Administered 2023-12-27 – 2023-12-29 (×4): 500 ug via ORAL
  Filled 2023-12-27 (×3): qty 1

## 2023-12-27 MED ORDER — ONDANSETRON HCL 4 MG PO TABS: 4.0000 mg | ORAL_TABLET | Freq: Four times a day (QID) | ORAL | Status: DC | PRN

## 2023-12-27 MED ORDER — METHYLPREDNISOLONE SODIUM SUCC 40 MG IJ SOLR
40.0000 mg | Freq: Every day | INTRAMUSCULAR | Status: AC
  Administered 2023-12-28: 40 mg via INTRAVENOUS
  Filled 2023-12-27: qty 1

## 2023-12-27 MED ORDER — FLUTICASONE PROPIONATE 50 MCG/ACT NA SUSP
2.0000 | Freq: Every day | NASAL | Status: DC
  Administered 2023-12-29 (×2): 2 via NASAL
  Filled 2023-12-27 (×2): qty 16

## 2023-12-27 MED ORDER — HEPARIN SODIUM (PORCINE) 5000 UNIT/ML IJ SOLN
5000.0000 [IU] | Freq: Three times a day (TID) | INTRAMUSCULAR | Status: DC
  Filled 2023-12-27 (×3): qty 1

## 2023-12-27 MED ORDER — IPRATROPIUM-ALBUTEROL 0.5-2.5 (3) MG/3ML IN SOLN: 3.0000 mL | Freq: Once | RESPIRATORY_TRACT | Status: DC

## 2023-12-27 MED ORDER — SERTRALINE HCL 50 MG PO TABS
100.0000 mg | ORAL_TABLET | Freq: Every day | ORAL | Status: DC
  Administered 2023-12-27 – 2023-12-29 (×4): 100 mg via ORAL
  Filled 2023-12-27 (×3): qty 2

## 2023-12-27 MED ORDER — PREDNISONE 20 MG PO TABS
60.0000 mg | ORAL_TABLET | Freq: Once | ORAL | Status: AC
  Administered 2023-12-27: 60 mg via ORAL
  Filled 2023-12-27: qty 3

## 2023-12-27 MED ORDER — ASPIRIN 81 MG PO CHEW
81.0000 mg | CHEWABLE_TABLET | Freq: Every day | ORAL | Status: DC
Start: 2023-12-27 — End: 2023-12-29
  Administered 2023-12-27 – 2023-12-29 (×4): 81 mg via ORAL
  Filled 2023-12-27 (×3): qty 1

## 2023-12-27 MED ORDER — IPRATROPIUM-ALBUTEROL 0.5-2.5 (3) MG/3ML IN SOLN
3.0000 mL | Freq: Once | RESPIRATORY_TRACT | Status: AC
  Administered 2023-12-27: 3 mL via RESPIRATORY_TRACT
  Filled 2023-12-27: qty 3

## 2023-12-27 NOTE — Assessment & Plan Note (Addendum)
 Secondary to oxygen concentrator broken by her dog Solu-Medrol  40 mg IV 1 dose ordered from 8/10 DuoNebs 4 times daily, 4 doses ordered on admission Continuous pulse oximetry TOC consulted for DME and possible placement pending PT, OT evaluation

## 2023-12-27 NOTE — Assessment & Plan Note (Signed)
 Home carvedilol  6.25 mg p.o. twice daily with meals resumed Hydralazine  5 mg IV every 6 hours as needed for SBP greater 170

## 2023-12-27 NOTE — Assessment & Plan Note (Addendum)
 At baseline level Home hydroxyurea  1500 mg daily resumed Continue outpatient follow-up with medical hematology/oncology

## 2023-12-27 NOTE — Assessment & Plan Note (Signed)
 On baseline oxygen supplementation at 2 L nasal cannula

## 2023-12-27 NOTE — ED Triage Notes (Addendum)
 Pt to ED with grandchild. Her oxygen concentrator broke last night. She usually wears 2L. SPO2  in triage is 89-90% on RA. Pt was 85% this AM at home. Pt gets SOB with activity, which is her norm but it is somewhat worse than usual.   Pt has inspiratory wheezing to L anterior chest and all lung fields are diminished. Hx CHF and COPD. Slightly labored. Place don 1L for comfort. Family is working on obtaining another oxygen concentrator for home.

## 2023-12-27 NOTE — Assessment & Plan Note (Signed)
 Aspirin  81 mg daily, rosuvastatin  20 mg nightly were resumed on admission

## 2023-12-27 NOTE — Assessment & Plan Note (Signed)
 Home rosuvastatin 20 mg nightly resumed

## 2023-12-27 NOTE — Assessment & Plan Note (Addendum)
 Carvedilol  6.25 mg p.o. twice daily with meals, aspirin  81 mg daily, rosuvastatin  20 mg nightly, Entresto  24-26 mg twice daily resumed resumed

## 2023-12-27 NOTE — ED Notes (Signed)
 CCMD notified of cardiac monitoring order.

## 2023-12-27 NOTE — ED Notes (Signed)
 This tech helped pt to ambulate to the restroom at this time. Pt hooked back up to monitor. Pt had SOB episode with some wheezing upon making it back to the bed. O2 is at 95-96 and is rising at this time. No further requests.

## 2023-12-27 NOTE — ED Provider Notes (Signed)
 [facility] Provider Note    Event Date/Time   First MD Initiated Contact with Patient 12/27/23 1212     (approximate)   History   Shortness of Breath   HPI  Nancy Merritt is a 83 year old female with history of CAD, asthma/COPD on 2 L home O2, CHF presenting to the emergency department for evaluation of shortness of breath.  Last night, patient's dog jumped up and broke her oxygen concentrator.  Since then she has had worsening shortness of breath, O2 sats as low as 86% at home with worsening wheezing.  They called their nurse line, who reported that they would be unable to provide a new oxygen set up until Monday.     Physical Exam   Triage Vital Signs: ED Triage Vitals  Encounter Vitals Group     BP 12/27/23 1106 (!) 148/82     Girls Systolic BP Percentile --      Girls Diastolic BP Percentile --      Boys Systolic BP Percentile --      Boys Diastolic BP Percentile --      Pulse Rate 12/27/23 1106 87     Resp 12/27/23 1106 (!) 22     Temp 12/27/23 1106 98.8 F (37.1 C)     Temp Source 12/27/23 1106 Oral     SpO2 12/27/23 1106 90 %     Weight 12/27/23 1110 134 lb 7.7 oz (61 kg)     Height 12/27/23 1110 5' (1.524 m)     Head Circumference --      Peak Flow --      Pain Score 12/27/23 1104 0     Pain Loc --      Pain Education --      Exclude from Growth Chart --     Most recent vital signs: Vitals:   12/27/23 1241 12/27/23 1300  BP: (!) 143/76 (!) 134/92  Pulse: 78 89  Resp: (!) 24 (!) 24  Temp:    SpO2: 93% 95%     General: Awake, interactive  CV:  Regular rate, good peripheral perfusion.  Resp:  Apneic with mildly labored respirations, diffuse wheezing Abd:  Nondistended.  Neuro:  Symmetric facial movement, fluid speech   ED Results / Procedures / Treatments   Labs (all labs ordered are listed, but only abnormal results are displayed) Labs Reviewed  BASIC METABOLIC PANEL WITH GFR - Abnormal; Notable for the following  components:      Result Value   Potassium 3.1 (*)    Glucose, Bld 146 (*)    All other components within normal limits  CBC - Abnormal; Notable for the following components:   RBC 3.48 (*)    MCV 104.0 (*)    MCH 35.1 (*)    RDW 16.3 (*)    Platelets 703 (*)    All other components within normal limits  BRAIN NATRIURETIC PEPTIDE     EKG EKG independently reviewed and interpreted by myself demonstrates:  EKG demonstrates sinus rhythm at a rate of 88, PR 158, QRS 76, QTc 425, no acute ST changes  RADIOLOGY Imaging independently reviewed and interpreted by myself demonstrates:  CXR without focal consolidation  Formal Radiology Read:  DG Chest 2 View Result Date: 12/27/2023 EXAM: 2 VIEW XRAY OF THE CHEST 12/27/2023 11:31:00 AM COMPARISON: 2 view chest x-Tashi Band 08/27/2023. CLINICAL HISTORY: Patient with history of CHF and COPD presents with shortness of breath. Oxygen concentrator malfunctioned, and patient usually requires 2L oxygen.  Family is working on obtaining another oxygen concentrator for home. FINDINGS: LUNGS AND PLEURA: No focal pulmonary opacity. No pulmonary edema. No pleural effusion. No pneumothorax. Changes of COPD are stable. HEART AND MEDIASTINUM: No acute abnormality of the cardiac and mediastinal silhouettes. 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 BONES AND SOFT TISSUES: No acute osseous abnormality. Mild rightward curvature is again noted in the thoracic spine. IMPRESSION: 1. Stable changes of COPD. Electronically signed by: Lonni Necessary MD 12/27/2023 11:41 AM EDT RP Workstation: [id number]    PROCEDURES:  Critical Care performed: No  Procedures   MEDICATIONS ORDERED IN ED: Medications  ipratropium-albuterol  (DUONEB) 0.5-2.5 (3) MG/3ML nebulizer solution 3 mL (3 mLs Nebulization Given 12/27/23 1229)  predniSONE  (DELTASONE ) tablet 60 mg (60 mg Oral Given 12/27/23 1317)  potassium chloride  SA (KLOR-CON  M) CR tablet 40 mEq (40 mEq Oral Given  12/27/23 1317)  ipratropium-albuterol  (DUONEB) 0.5-2.5 (3) MG/3ML nebulizer solution 6 mL (6 mLs Nebulization Given 12/27/23 1317)     IMPRESSION / MDM / ASSESSMENT AND PLAN / ED COURSE  I reviewed the triage vital signs and the nursing notes.  Differential diagnosis includes, but is not limited to, COPD exacerbation, CHF exacerbation, pneumonia, viral illness, worsening respiratory status due to lack of access to home oxygen  Patient's presentation is most consistent with acute presentation with potential threat to life or bodily function.  83 year old female presenting with shortness of breath.  Tachypneic with borderline hypoxia on presentation.  Labs with mild hypokalemia, orally repleted.  BNP within normal limits.  X-Adi Seales without focal consolidation.  Patient does have wheezing on exam.  Given a breathing treatment and remained with persistent wheezing and tachypnea.  Will add on additional nebulizer treatments as well as steroids.  Presentation is concerning for COPD flare.  Patient denies change in cough, will hold off on antibiotics.  I did discuss her case with [place] with social work who recommended admission until her oxygen concentrator can be replaced by her oxygen company.  With this, we will reach out to hospitalist team to discuss admission.  Case discussed with Dr. Sherre with hospitalist team.  She will evaluate for anticipated admission.     FINAL CLINICAL IMPRESSION(S) / ED DIAGNOSES   Final diagnoses:  COPD exacerbation (HCC)  Shortness of breath     Rx / DC Orders   ED Discharge Orders     None        Note:  This document was prepared using Dragon voice recognition software and may include unintentional dictation errors.   Levander Slate, MD 12/27/23 [phone]

## 2023-12-27 NOTE — ED Notes (Signed)
 Pt 93-94% room air at this time.

## 2023-12-27 NOTE — ED Notes (Signed)
 MD bedside

## 2023-12-27 NOTE — Plan of Care (Signed)

## 2023-12-27 NOTE — Assessment & Plan Note (Signed)
Home sertraline 100 mg daily resumed

## 2023-12-27 NOTE — Hospital Course (Addendum)
 Ms. [name] Memos is an 83 year old female with history of asthma, COPD on chronic 2 L nasal cannula, CAD, depression, hyperlipidemia, hypertension, essential thrombocytosis with CALR mutation, who presents ED for chief concerns of shortness of breath.  Patient's dog jumped on her last night and broke her oxygen concentrator.  Patient has been short of breath and hypoxic since.  Per ED documentation, EMS reported that patient had saturation of 85% on room air at home.  Vitals in the ED showed T of 98.8, rr22, hr 87, blood pressure 148/82, SpO2 of 90% on room air.  Serum sodium is 139, potassium 3.1, chloride 104, bicarb 28, BUN 19, serum creatinine 0.61, EGFR greater than 60, nonfasting blood glucose 146, WBC 6.3, hemoglobin 12.2, platelets of 703.  ED treatment: DuoNebs 2 treatments, potassium chloride  40 mEq PO once, prednisone  60 mg p.o. one-time dose.

## 2023-12-27 NOTE — H&P (Addendum)
 History and Physical   Nancy Merritt FMW:[id number] DOB: May 27, 1940 DOA: 12/27/2023  PCP: Rilla Baller, MD  Outpatient Specialists: Dr. Jacobo, Patient coming from: Home via EMS  I have personally briefly reviewed patient's old medical records in [facility] EMR.  Chief Concern: Shortness of breath  HPI: Nancy Merritt is an 83 year old female with history of asthma, COPD on chronic 2 L nasal cannula, CAD, depression, hyperlipidemia, hypertension, essential thrombocytosis with CALR mutation, who presents ED for chief concerns of shortness of breath.  Patient's dog jumped on her last night and broke her oxygen concentrator.  Patient has been short of breath and hypoxic since.  Per ED documentation, EMS reported that patient had saturation of 85% on room air at home.  Vitals in the ED showed T of 98.8, rr22, hr 87, blood pressure 148/82, SpO2 of 90% on room air.  Serum sodium is 139, potassium 3.1, chloride 104, bicarb 28, BUN 19, serum creatinine 0.61, EGFR greater than 60, nonfasting blood glucose 146, WBC 6.3, hemoglobin 12.2, platelets of 703.  ED treatment: DuoNebs 2 treatments, potassium chloride  40 mEq PO once, prednisone  60 mg p.o. one-time dose. ------------------------------------- At bedside, patient able to tell me her first and last name, age, location, current calendar year.  She reports that her dog had jumped on her last night and broke her oxygen concentrator.  They had no backup plans in terms of oxygen.  She has been increasingly short of breath since then.  At bedside, she reports her shortness of breath is much improved.  She reports she wears 2 L nasal cannula continuously.  She denies other trauma to her person.  She denies nausea, vomiting, chest pain, abdominal pain, double vision, syncope, loss of consciousness, swelling of her lower extremities, dysuria, hematuria, diarrhea, blood in her stool.  She denies cough, dysphagia.  Social history: She  lives at home with her daughter Josetta.  She reports her daughter is her healthcare power of attorney.  She denies tobacco, EtOH, recreational drug use.  Patient is retired.  ROS: Constitutional: no weight change, no fever ENT/Mouth: no sore throat, no rhinorrhea Eyes: no eye pain, no vision changes Cardiovascular: no chest pain, + dyspnea,  no edema, no palpitations Respiratory: no cough, no sputum, no wheezing Gastrointestinal: no nausea, no vomiting, no diarrhea, no constipation Genitourinary: no urinary incontinence, no dysuria, no hematuria Musculoskeletal: no arthralgias, no myalgias Skin: no skin lesions, no pruritus, Neuro: + weakness, no loss of consciousness, no syncope Psych: no anxiety, no depression,+ decrease appetite Heme/Lymph: no bruising, no bleeding  ED Course: Discussed with EDP, patient requiring hospitalization for chief concerns of COPD exacerbation secondary to oxygen concentrator breaking.  Assessment/Plan  Principal Problem:   COPD exacerbation (HCC) Active Problems:   Hypertension   CAD (coronary artery disease)   Primary hyperparathyroidism (HCC)   COPD (chronic obstructive pulmonary disease) (HCC)   History of CVA (cerebrovascular accident) without residual deficits   Essential thrombocytosis (HCC)   Polypharmacy   MDD (major depressive disorder), recurrent episode, moderate (HCC)   Hyperlipidemia   Assessment and Plan:  * COPD exacerbation (HCC) Secondary to oxygen concentrator broken by her dog Solu-Medrol  40 mg IV 1 dose ordered from 8/10 DuoNebs 4 times daily, 4 doses ordered on admission Continuous pulse oximetry TOC consulted for DME and possible placement pending PT, OT evaluation  Hyperlipidemia Home rosuvastatin  20 mg nightly resumed  MDD (major depressive disorder), recurrent episode, moderate (HCC) Home sertraline  100 mg daily resumed  Essential thrombocytosis (HCC) At  baseline level Home hydroxyurea  1500 mg daily  resumed Continue outpatient follow-up with medical hematology/oncology  History of CVA (cerebrovascular accident) without residual deficits Aspirin  81 mg daily, rosuvastatin  20 mg nightly were resumed on admission  COPD (chronic obstructive pulmonary disease) (HCC) On baseline oxygen supplementation at 2 L nasal cannula  CAD (coronary artery disease) Carvedilol  6.25 mg p.o. twice daily with meals, aspirin  81 mg daily, rosuvastatin  20 mg nightly, Entresto  24-26 mg twice daily resumed resumed  Hypertension Home carvedilol  6.25 mg p.o. twice daily with meals resumed Hydralazine  5 mg IV every 6 hours as needed for SBP greater 170  Chart reviewed.   DVT prophylaxis: Heparin  5000 units subcutaneous every 8 hours Code Status: DNR/DNI Diet: Heart healthy Family Communication: Updated granddaughter, Marry at bedside with patient's permission Disposition Plan: Pending clinical course Consults called: TOC for DME, PT, OT Admission status: Telemetry medical, observation  Past Medical History:  Diagnosis Date   Arthritis    Asthma    Basal cell carcinoma 08/18/2023   right medial pretibial, [facility]   Cancer (HCC) 1990   CHF (congestive heart failure) (HCC)    COPD (chronic obstructive pulmonary disease) (HCC)    Coronary artery disease    COVID-19 06/14/2020   Depression    Hx of breast cancer 04/07/2018   1990. No screening in last 25 years   Hypertension    Past Surgical History:  Procedure Laterality Date   BREAST SURGERY Left 1990   CORONARY ARTERY BYPASS GRAFT     JOINT REPLACEMENT Right    Hip   LEFT HEART CATH AND CORS/GRAFTS ANGIOGRAPHY N/A 06/05/2022   Procedure: LEFT HEART CATH AND CORS/GRAFTS ANGIOGRAPHY;  Surgeon: Darron Deatrice LABOR, MD;  Location: [facility];  Service: Cardiovascular;  Laterality: N/A;   OVARIAN CYST REMOVAL  1970   TUBAL LIGATION  1970   Social History:  reports that she quit smoking about 23 years ago. Her smoking use included cigarettes.  She started smoking about 63 years ago. She has a 40 pack-year smoking history. She has never used smokeless tobacco. She reports that she does not currently use alcohol. She reports that she does not use drugs.  Allergies  Allergen Reactions   Advair Diskus [Fluticasone -Salmeterol] Shortness Of Breath    As of 09/01/2014. Pt can take Breo   Bee Pollen Other (See Comments)   Budesonide -Formoterol Fumarate Other (See Comments)    Hard time breathing as of 10/31/2014.   Levofloxacin Other (See Comments)    Nausea, brain fog, depression, as of 12/12/14   Pollen Extract Cough   Qvar [Beclomethasone] Other (See Comments)    Lungs closing-as of 05/03/2015   Singulair [Montelukast] Shortness Of Breath   Family History  Problem Relation Age of Onset   Cancer Father        lung   Heart attack Father 68   Alcohol abuse Father    Arthritis Father    COPD Father    Diabetes Father    Hypertension Father    Breast cancer Sister 61   Arthritis Sister    Cancer Sister    COPD Sister    Drug abuse Sister    Hypertension Sister    Miscarriages / [place] Sister    Thyroid  disease Sister    Arthritis Mother    COPD Mother    Diabetes Mother    Hearing loss Mother    Hypertension Mother    Miscarriages / [place] Mother    Stroke Mother  Alcohol abuse Brother    Arthritis Brother    Hypertension Brother    Arthritis Daughter    Asthma Daughter    Depression Daughter    Hypertension Daughter    Miscarriages / [place] Daughter    Thyroid  disease Daughter    Arthritis Maternal Grandmother    Hypertension Maternal Grandmother    Stroke Maternal Grandmother    Thyroid  disease Maternal Grandmother    Alcohol abuse Maternal Grandfather    Arthritis Maternal Grandfather    Cancer Maternal Grandfather    Depression Maternal Grandfather    Hearing loss Maternal Grandfather    Hypertension Maternal Grandfather    Arthritis Sister    Thyroid  disease Sister    Thyroid  disease  Daughter    Hypertension Daughter    Depression Daughter    COPD Daughter    Alcohol abuse Daughter    Arthritis Daughter    Arthritis Daughter    COPD Daughter    Depression Daughter    Hearing loss Daughter    Hypertension Daughter    Thyroid  disease Daughter    Family history: Family history reviewed and not pertinent.  Prior to Admission medications   Medication Sig Start Date End Date Taking? Authorizing Provider  acetaminophen  (TYLENOL ) 500 MG tablet Take 1 tablet (500 mg total) by mouth 3 (three) times daily as needed for moderate pain. 06/11/22   Rilla Baller, MD  albuterol  (PROVENTIL ) (2.5 MG/3ML) 0.083% nebulizer solution TAKE 3 MLS (2.5 MG TOTAL) BY NEBULIZATION EVERY 6 (SIX) HOURS AS NEEDED FOR SHORTNESS OF BREATH. 08/08/23   Rilla Baller, MD  albuterol  (VENTOLIN  HFA) 108 (90 Base) MCG/ACT inhaler INHALE 2 PUFFS INTO THE LUNGS EVERY 4 HOURS AS NEEDED FOR WHEEZING OR SHORTNESS OF BREATH. 11/04/23   Rilla Baller, MD  arformoterol  (BROVANA ) 15 MCG/2ML NEBU Take 2 mLs (15 mcg total) by nebulization 2 (two) times daily. 10/15/23   Kasa, Kurian, MD  aspirin  81 MG chewable tablet Chew 1 tablet (81 mg total) by mouth daily. 06/07/22   Meng, Hao, PA  BREO ELLIPTA  200-25 MCG/ACT AEPB TAKE 1 PUFF BY MOUTH EVERY DAY 07/31/23   Rilla Baller, MD  budesonide  (PULMICORT ) 0.5 MG/2ML nebulizer solution Take 2 mLs (0.5 mg total) by nebulization 2 (two) times daily. 10/15/23 10/14/24  Kasa, Kurian, MD  carvedilol  (COREG ) 6.25 MG tablet TAKE 1 TABLET BY MOUTH 2 TIMES DAILY WITH A MEAL. 09/29/23   Meng, Hao, PA  Cholecalciferol  (VITAMIN D3) 25 MCG (1000 UT) capsule Take 2 capsules (2,000 Units total) by mouth daily. 08/15/22   Rilla Baller, MD  clopidogrel  (PLAVIX ) 75 MG tablet Take 1 tablet (75 mg total) by mouth daily with breakfast. 10/17/23   Okey Vina GAILS, MD  doxycycline  (VIBRA -TABS) 100 MG tablet Take 1 tablet (100 mg total) by mouth 2 (two) times daily. 12/10/23   Tamea Dedra CROME, MD  ezetimibe  (ZETIA ) 10 MG tablet TAKE 1 TABLET BY MOUTH EVERY DAY 06/17/23   Meng, Hao, PA  fluticasone  (FLONASE ) 50 MCG/ACT nasal spray Place 2 sprays into both nostrils daily. 08/15/23   Rilla Baller, MD  hydroxyurea  (HYDREA ) 500 MG capsule Take 3 capsules (1,500 mg total) by mouth daily. May take with food to minimize GI side effects. 06/19/23   Finnegan, Timothy J, MD  ipratropium-albuterol  (DUONEB) 0.5-2.5 (3) MG/3ML SOLN Take 3 mLs by nebulization every 6 (six) hours as needed. 08/15/23   Rilla Baller, MD  loratadine  (CLARITIN ) 10 MG tablet Take 10 mg by mouth daily.  [provider]  Multiple Vitamin (MULTIVITAMIN) tablet Take 1 tablet by mouth daily.    [provider]  Multiple Vitamins-Minerals (PRESERVISION AREDS 2) CAPS Take 2 each by mouth daily at 12 noon.    [provider]  mupirocin  ointment (BACTROBAN ) 2 % Apply topically  wound at right lower leg and cover with bandage daily until healed. 08/18/23   Hester Alm BROCKS, MD  nitroGLYCERIN  (NITROSTAT ) 0.4 MG SL tablet Place 1 tablet (0.4 mg total) under the tongue every 5 (five) minutes as needed for chest pain. 06/06/22   Meng, Hao, PA  predniSONE  (DELTASONE ) 20 MG tablet Take 1 tablet (20 mg total) by mouth daily with breakfast. 7  days 12/10/23   Tamea Dedra CROME, MD  roflumilast  (DALIRESP ) 500 MCG TABS tablet Take 1 tablet (500 mcg total) by mouth daily. 10/29/23   Kasa, Kurian, MD  rosuvastatin  (CRESTOR ) 20 MG tablet Take 1 tablet (20 mg total) by mouth at bedtime. 06/06/22   Meng, Hao, PA  sacubitril -valsartan  (ENTRESTO ) 24-26 MG Take 2 tablets by mouth 2 (two) times daily. 06/24/23   Okey Vina GAILS, MD  sertraline  (ZOLOFT ) 100 MG tablet Take 1 tablet (100 mg total) by mouth daily. 08/15/23   Rilla Baller, MD  Tiotropium Bromide  Monohydrate (SPIRIVA  RESPIMAT) 2.5 MCG/ACT AERS Inhale 2 puffs into the lungs daily. 02/05/23   Rilla Baller, MD   Physical Exam: Vitals:   12/27/23  1400 12/27/23 1430 12/27/23 1457 12/27/23 1500  BP: 133/88 (!) 135/91  135/79  Pulse: 76 80  73  Resp:  (!) 31  (!) 25  Temp:   98.7 F (37.1 C)   TempSrc:   Oral   SpO2: 99% 100%  98%  Weight:      Height:       Constitutional: appears frail, age-appropriate, NAD, calm Eyes: PERRL, lids and conjunctivae normal ENMT: Mucous membranes are moist. Posterior pharynx clear of any exudate or lesions. Age-appropriate dentition. Hearing appropriate Neck: normal, supple, no masses, no thyromegaly Respiratory:  + generalized expiratory wheezing, no crackles. Normal respiratory effort. No accessory muscle use.  Cardiovascular: Regular rate and rhythm, no murmurs / rubs / gallops. No extremity edema. 2+ pedal pulses. No carotid bruits.  Abdomen: no tenderness, no masses palpated, no hepatosplenomegaly. Bowel sounds positive.  Musculoskeletal: no clubbing / cyanosis. No joint deformity upper and lower extremities. Good ROM, no contractures, no atrophy. Normal muscle tone.  Skin: no rashes, lesions, ulcers. No induration Neurologic: Sensation intact. Strength 5/5 in all 4.  Psychiatric: Normal judgment and insight. Alert and oriented x 3. Normal mood.   EKG: independently reviewed, showing sinus sinus rhythm with rate of 88, frequent PVCs, QTc 425  Chest x-ray on Admission: I personally reviewed and I agree with radiologist reading as below.  DG Chest 2 View Result Date: 12/27/2023 EXAM: 2 VIEW XRAY OF THE CHEST 12/27/2023 11:31:00 AM COMPARISON: 2 view chest x-ray 08/27/2023. CLINICAL HISTORY: Patient with history of CHF and COPD presents with shortness of breath. Oxygen concentrator malfunctioned, and patient usually requires 2L oxygen. Family is working on obtaining another oxygen concentrator for home. FINDINGS: LUNGS AND PLEURA: No focal pulmonary opacity. No pulmonary edema. No pleural effusion. No pneumothorax. Changes of COPD are stable. HEART AND MEDIASTINUM: No acute abnormality of the cardiac  and mediastinal silhouettes. 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 BONES AND SOFT TISSUES: No acute osseous abnormality. Mild rightward curvature is again noted in the thoracic spine. IMPRESSION: 1. Stable changes of COPD.  Electronically signed by: Lonni Necessary MD 12/27/2023 11:41 AM EDT RP Workstation: [id number]   Labs on Admission: I have personally reviewed following labs  CBC: Recent Labs  Lab 12/27/23 1118  WBC 6.3  HGB 12.2  HCT 36.2  MCV 104.0*  PLT 703*   Basic Metabolic Panel: Recent Labs  Lab 12/27/23 1118  NA 139  K 3.1*  CL 104  CO2 28  GLUCOSE 146*  BUN 19  CREATININE 0.61  CALCIUM  9.9  MG 2.0   GFR: Estimated Creatinine Clearance: 42.7 mL/min (by C-G formula based on SCr of 0.61 mg/dL).  Urine analysis:    Component Value Date/Time   COLORURINE YELLOW 05/26/2023 1817   APPEARANCEUR HAZY (A) 05/26/2023 1817   LABSPEC 1.023 05/26/2023 1817   PHURINE 5.0 05/26/2023 1817   GLUCOSEU NEGATIVE 05/26/2023 1817   HGBUR NEGATIVE 05/26/2023 1817   BILIRUBINUR NEGATIVE 05/26/2023 1817   BILIRUBINUR negative 05/26/2023 1418   BILIRUBINUR Negative 06/14/2020 1457   KETONESUR NEGATIVE 05/26/2023 1817   KETONESUR negative 05/26/2023 1418   PROTEINUR NEGATIVE 05/26/2023 1817   PROTEINUR trace (A) 05/26/2023 1418   UROBILINOGEN 0.2 05/26/2023 1418   NITRITE NEGATIVE 05/26/2023 1817   NITRITE Negative 05/26/2023 1418   LEUKOCYTESUR NEGATIVE 05/26/2023 1817   LEUKOCYTESUR Negative 05/26/2023 1418   This document was prepared using Dragon Voice Recognition software and may include unintentional dictation errors.  Dr. Sherre [facility]  If 7PM-7AM, please contact overnight-coverage provider If 7AM-7PM, please contact day attending provider [email or web address]  12/27/2023, 3:08 PM

## 2023-12-28 DIAGNOSIS — J441 Chronic obstructive pulmonary disease with (acute) exacerbation: Secondary | ICD-10-CM | POA: Diagnosis not present

## 2023-12-28 LAB — CBC
HCT: 35.6 % — ABNORMAL LOW (ref 36.0–46.0)
Hemoglobin: 11.8 g/dL — ABNORMAL LOW (ref 12.0–15.0)
MCH: 34.9 pg — ABNORMAL HIGH (ref 26.0–34.0)
MCHC: 33.1 g/dL (ref 30.0–36.0)
MCV: 105.3 fL — ABNORMAL HIGH (ref 80.0–100.0)
Platelets: 752 K/uL — ABNORMAL HIGH (ref 150–400)
RBC: 3.38 MIL/uL — ABNORMAL LOW (ref 3.87–5.11)
RDW: 15.9 % — ABNORMAL HIGH (ref 11.5–15.5)
WBC: 6 K/uL (ref 4.0–10.5)
nRBC: 0 % (ref 0.0–0.2)

## 2023-12-28 LAB — BASIC METABOLIC PANEL WITH GFR
Anion gap: 5 (ref 5–15)
BUN: 16 mg/dL (ref 8–23)
CO2: 27 mmol/L (ref 22–32)
Calcium: 9.6 mg/dL (ref 8.9–10.3)
Chloride: 107 mmol/L (ref 98–111)
Creatinine, Ser: 0.53 mg/dL (ref 0.44–1.00)
GFR, Estimated: >60 mL/min (ref >60)
Glucose, Bld: 124 mg/dL — ABNORMAL HIGH (ref 70–99)
Potassium: 3.2 mmol/L — ABNORMAL LOW (ref 3.5–5.1)
Sodium: 139 mmol/L (ref 135–145)

## 2023-12-28 MED ORDER — IPRATROPIUM-ALBUTEROL 0.5-2.5 (3) MG/3ML IN SOLN
3.0000 mL | Freq: Two times a day (BID) | RESPIRATORY_TRACT | Status: DC
  Administered 2023-12-28 – 2023-12-29 (×3): 3 mL via RESPIRATORY_TRACT
  Filled 2023-12-28 (×2): qty 3

## 2023-12-28 MED ORDER — IPRATROPIUM-ALBUTEROL 0.5-2.5 (3) MG/3ML IN SOLN: 3.0000 mL | Freq: Four times a day (QID) | RESPIRATORY_TRACT | Status: DC

## 2023-12-28 MED ORDER — PREDNISONE 20 MG PO TABS
40.0000 mg | ORAL_TABLET | Freq: Every day | ORAL | Status: DC
  Administered 2023-12-29 (×2): 40 mg via ORAL
  Filled 2023-12-28: qty 2

## 2023-12-28 MED ORDER — LIDOCAINE 5 % EX PTCH
1.0000 | MEDICATED_PATCH | CUTANEOUS | Status: DC
  Administered 2023-12-28 – 2023-12-29 (×3): 1 via TRANSDERMAL
  Filled 2023-12-28 (×2): qty 1

## 2023-12-28 MED ORDER — ORAL CARE MOUTH RINSE: 15.0000 mL | OROMUCOSAL | Status: DC | PRN

## 2023-12-28 MED ORDER — POTASSIUM CHLORIDE CRYS ER 20 MEQ PO TBCR
40.0000 meq | EXTENDED_RELEASE_TABLET | Freq: Once | ORAL | Status: AC
  Administered 2023-12-28: 40 meq via ORAL
  Filled 2023-12-28: qty 2

## 2023-12-28 NOTE — Progress Notes (Signed)
 PROGRESS NOTE    Nancy Merritt  FMW:[id number] DOB: 1940-08-21 DOA: 12/27/2023 PCP: Rilla Baller, MD    Brief Narrative:   83 year old female with history of asthma, COPD on chronic 2 L nasal cannula, CAD, depression, hyperlipidemia, hypertension, essential thrombocytosis with CALR mutation, who presents ED for chief concerns of shortness of breath.   Patient's dog jumped on her last night and broke her oxygen concentrator.  Patient has been short of breath and hypoxic since.  She reports that her dog had jumped on her last night and broke her oxygen concentrator.  They had no backup plans in terms of oxygen.  She has been increasingly short of breath since then.   At bedside, she reports her shortness of breath is much improved.  She reports she wears 2 L nasal cannula continuously.  She denies other trauma to her person.  She denies nausea, vomiting, chest pain, abdominal pain, double vision, syncope, loss of consciousness, swelling of her lower extremities, dysuria, hematuria, diarrhea, blood in her stool.  She denies cough, dysphagia.   Assessment & Plan:   Principal Problem:   COPD exacerbation (HCC) Active Problems:   Hypertension   CAD (coronary artery disease)   Primary hyperparathyroidism (HCC)   COPD (chronic obstructive pulmonary disease) (HCC)   History of CVA (cerebrovascular accident) without residual deficits   Essential thrombocytosis (HCC)   Polypharmacy   MDD (major depressive disorder), recurrent episode, moderate (HCC)   Hyperlipidemia * COPD exacerbation (HCC) Secondary to oxygen concentrator broken by her dog Still wheezing Plan: Solu-Medrol  40 mg IV today P.o. prednisone  40 mg daily starting 8/11 Scheduled and as needed bronchodilators Therapy evaluations, ambulate as tolerated   Hyperlipidemia Continue Home rosuvastatin  20 mg nightly    MDD (major depressive disorder), recurrent episode, moderate (HCC) Continue Home sertraline  100 mg daily    Essential thrombocytosis (HCC) At baseline level Continue Home hydroxyurea  1500 mg daily  outpatient follow-up with medical hematology/oncology   History of CVA (cerebrovascular accident) without residual deficits Continue aspirin  81 mg daily, rosuvastatin  20 mg nightly   COPD (chronic obstructive pulmonary disease) (HCC) At baseline   CAD (coronary artery disease) Continue carvedilol  6.25 mg p.o. twice daily with meals, aspirin  81 mg daily, rosuvastatin  20 mg nightly, Entresto  24-26 mg twice daily   Hypertension Continue Home carvedilol  6.25 mg p.o. twice daily with meals  Hydralazine  5 mg IV every 6 hours as needed for SBP greater 170       DVT prophylaxis: SQH Code Status: DNR/DNI Family Communication: Daughter [phone] on 8/10 Disposition Plan: Status is: Observation The patient will require care spanning > 2 midnights and should be moved to inpatient because: COPD exacerbation on IV steroids.  Anticipated date of discharge 8/11.   Level of care: Telemetry Medical  Consultants:  None  Procedures:  None  Antimicrobials: None   Subjective: Seen and examined.  Resting comfortably in bed.  Reports shortness of breath is improved.  Still wheezing.  Objective: Vitals:   12/28/23 0737 12/28/23 1034 12/28/23 1047 12/28/23 1051  BP: (!) 143/61 (!) 70/49 128/88 (!) 116/106  Pulse: 84 72 71 80  Resp: 18     Temp: (!) 97.5 F (36.4 C)     TempSrc:      SpO2: 99% 96%    Weight:      Height:        Intake/Output Summary (Last 24 hours) at 12/28/2023 1052 Last data filed at 12/27/2023 2114 Gross per 24 hour  Intake 240 ml  Output --  Net 240 ml   Filed Weights   12/27/23 1110 12/27/23 1951  Weight: 61 kg 60.9 kg    Examination:  General exam: Appears calm and comfortable  Respiratory system: End expiratory wheeze in all lung fields.  Normal work of breathing.  2 L Cardiovascular system: S1-S2, RRR, no murmurs, no pedal edema Gastrointestinal system:  Soft, NT/ND, normal bowel sounds Central nervous system: Alert and oriented. No focal neurological deficits. Extremities: Symmetric 5 x 5 power. Skin: No rashes, lesions or ulcers Psychiatry: Judgement and insight appear normal. Mood & affect appropriate.     Data Reviewed: I have personally reviewed following labs and imaging studies  CBC: Recent Labs  Lab 12/27/23 1118 12/28/23 0414  WBC 6.3 6.0  HGB 12.2 11.8*  HCT 36.2 35.6*  MCV 104.0* 105.3*  PLT 703* 752*   Basic Metabolic Panel: Recent Labs  Lab 12/27/23 1118 12/28/23 0414  NA 139 139  K 3.1* 3.2*  CL 104 107  CO2 28 27  GLUCOSE 146* 124*  BUN 19 16  CREATININE 0.61 0.53  CALCIUM  9.9 9.6  MG 2.0  --    GFR: Estimated Creatinine Clearance: 42.7 mL/min (by C-G formula based on SCr of 0.53 mg/dL). Liver Function Tests: No results for input(s): AST, ALT, ALKPHOS, BILITOT, PROT, ALBUMIN in the last 168 hours. No results for input(s): LIPASE, AMYLASE in the last 168 hours. No results for input(s): AMMONIA in the last 168 hours. Coagulation Profile: No results for input(s): INR, PROTIME in the last 168 hours. Cardiac Enzymes: No results for input(s): CKTOTAL, CKMB, CKMBINDEX, TROPONINI in the last 168 hours. BNP (last 3 results) No results for input(s): PROBNP in the last 8760 hours. HbA1C: No results for input(s): HGBA1C in the last 72 hours. CBG: No results for input(s): GLUCAP in the last 168 hours. Lipid Profile: No results for input(s): CHOL, HDL, LDLCALC, TRIG, CHOLHDL, LDLDIRECT in the last 72 hours. Thyroid  Function Tests: No results for input(s): TSH, T4TOTAL, FREET4, T3FREE, THYROIDAB in the last 72 hours. Anemia Panel: No results for input(s): VITAMINB12, FOLATE, FERRITIN, TIBC, IRON, RETICCTPCT in the last 72 hours. Sepsis Labs: No results for input(s): PROCALCITON, LATICACIDVEN in the last 168 hours.  No results found  for this or any previous visit (from the past 240 hours).       Radiology Studies: DG Chest 2 View Result Date: 12/27/2023 EXAM: 2 VIEW XRAY OF THE CHEST 12/27/2023 11:31:00 AM COMPARISON: 2 view chest x-ray 08/27/2023. CLINICAL HISTORY: Patient with history of CHF and COPD presents with shortness of breath. Oxygen concentrator malfunctioned, and patient usually requires 2L oxygen. Family is working on obtaining another oxygen concentrator for home. FINDINGS: LUNGS AND PLEURA: No focal pulmonary opacity. No pulmonary edema. No pleural effusion. No pneumothorax. Changes of COPD are stable. HEART AND MEDIASTINUM: No acute abnormality of the cardiac and mediastinal silhouettes. 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 BONES AND SOFT TISSUES: No acute osseous abnormality. Mild rightward curvature is again noted in the thoracic spine. IMPRESSION: 1. Stable changes of COPD. Electronically signed by: Lonni Necessary MD 12/27/2023 11:41 AM EDT RP Workstation: [id number]        Scheduled Meds:  arformoterol   15 mcg Nebulization BID   aspirin   81 mg Oral Daily   carvedilol   6.25 mg Oral BID WC   cholecalciferol   2,000 Units Oral Daily   clopidogrel   75 mg Oral Q breakfast   ezetimibe   10 mg Oral Daily   fluticasone   2 spray Each Nare Daily   heparin   5,000 Units Subcutaneous Q8H   hydroxyurea   1,500 mg Oral Daily   ipratropium-albuterol   3 mL Nebulization BID   loratadine   10 mg Oral Daily   multivitamin  2 tablet Oral Q1200   [START ON 12/29/2023] predniSONE   40 mg Oral Q breakfast   roflumilast   500 mcg Oral Daily   rosuvastatin   20 mg Oral QHS   sacubitril -valsartan   2 tablet Oral BID   sertraline   100 mg Oral Daily   Continuous Infusions:   LOS: 0 days    Calvin KATHEE Robson, MD [facility]   If 7PM-7AM, please contact night-coverage  12/28/2023, 10:52 AM

## 2023-12-28 NOTE — Evaluation (Signed)
 Occupational Therapy Evaluation Patient Details Name: Nancy Merritt MRN: [id number] DOB: [birth year] Today's Date: 12/28/2023   History of Present Illness   Nancy Merritt is an 83 year old female with history of asthma, COPD on chronic 2 L nasal cannula, CAD, depression, hyperlipidemia, hypertension, essential thrombocytosis with CALR mutation, who presents ED for chief concerns of shortness of breath.    Clinical Impressions Nancy Merritt was seen for OT evaluation this date. Prior to hospital admission, pt was MOD I using rollator or SPC. Pt lives with daughter. Pt currently requires SUPERVISION + SPC for toilet t/f and standing UB dressing. SUPERVISION + SPC funcitonal mobility for ~100 ft, minor posterior LOBs noted, pt self corrects. Desat 86% on RA, resolved to 89% with 2L [place] with activity, 90s seated rest break. Pt appears near baseline with strong family support. No skilled acute OT needs identified, will sign off.     If plan is discharge home, recommend the following:   Help with stairs or ramp for entrance     Functional Status Assessment   Patient has had a recent decline in their functional status and demonstrates the ability to make significant improvements in function in a reasonable and predictable amount of time.     Equipment Recommendations   None recommended by OT     Recommendations for Other Services         Precautions/Restrictions   Precautions Precautions: None Restrictions Weight Bearing Restrictions Per Provider Order: No     Mobility Bed Mobility Overal bed mobility: Modified Independent                  Transfers Overall transfer level: Modified independent Equipment used: Straight cane                      Balance Overall balance assessment: Mild deficits observed, not formally tested                                         ADL either performed or assessed with clinical judgement   ADL  Overall ADL's : Needs assistance/impaired                                       General ADL Comments: SUPERVISION + SPC for toilet t/f and standing UB dressing. SUPERVISION + SPC funcitonal mobility for ~100 ft, minor posterior LOBs noted, pt self corrects. Desat 86% on RA, resolved to 89% with 2L [place] with activity, 90s seated rest break.      Pertinent Vitals/Pain Pain Assessment Pain Assessment: No/denies pain     Extremity/Trunk Assessment Upper Extremity Assessment Upper Extremity Assessment: Overall WFL for tasks assessed   Lower Extremity Assessment Lower Extremity Assessment: Overall WFL for tasks assessed       Communication Communication Communication: Impaired Factors Affecting Communication: Hearing impaired   Cognition Arousal: Alert Behavior During Therapy: WFL for tasks assessed/performed Cognition: No apparent impairments                               Following commands: Intact       Cueing  General Comments          Exercises     Shoulder Instructions      Home Living Family/patient  expects to be discharged to:: Private residence Living Arrangements: Children Available Help at Discharge: Family Type of Home: House Home Access: Stairs to enter Secretary/administrator of Steps: 5 Entrance Stairs-Rails: Right Home Layout: One level               Home Equipment: Rollator (4 wheels)   Additional Comments: 2L [place]      Prior Functioning/Environment Prior Level of Function : Independent/Modified Independent                    OT Problem List: Decreased activity tolerance   OT Treatment/Interventions:        OT Goals(Current goals can be found in the care plan section)   Acute Rehab OT Goals Patient Stated Goal: go home OT Goal Formulation: With patient Time For Goal Achievement: 12/28/23 Potential to Achieve Goals: Good   OT Frequency:       Co-evaluation              AM-PAC OT 6  Clicks Daily Activity     Outcome Measure Help from another person eating meals?: None Help from another person taking care of personal grooming?: None Help from another person toileting, which includes using toliet, bedpan, or urinal?: None Help from another person bathing (including washing, rinsing, drying)?: A Little Help from another person to put on and taking off regular upper body clothing?: None Help from another person to put on and taking off regular lower body clothing?: None 6 Click Score: 23   End of Session Equipment Utilized During Treatment: Oxygen  Activity Tolerance: Patient tolerated treatment well Patient left: in bed;with call bell/phone within reach;with bed alarm set  OT Visit Diagnosis: Other abnormalities of gait and mobility (R26.89)                Time: 9048-8988 OT Time Calculation (min): 20 min Charges:  OT General Charges $OT Visit: 1 Visit OT Evaluation $OT Eval Low Complexity: 1 Low OT Treatments $Self Care/Home Management : 8-22 mins  Elston Slot, M.S. OTR/L  12/28/23, 11:23 AM  ascom [phone]

## 2023-12-28 NOTE — Progress Notes (Signed)
 PT Cancellation Note  Patient Details Name: Nancy Merritt MRN: [id number] DOB: Apr 14, 1941   Cancelled Treatment:    Reason Eval/Treat Not Completed: PT screened, no needs identified, will sign off  Spoke to OT and after screening need for physical therapy it was discussed that further need for acute care PT services are not needed to discharge patient home safely. No acute changes to strength or balance at this time. Patient has known baseline imbalance and can talk with PCP as she desires.   [name] 12/28/2023, 12:50 PM

## 2023-12-28 NOTE — TOC Initial Note (Signed)
 Transition of Care [facility]) - Initial/Assessment Note    Patient Details  Name: Nancy Merritt MRN: [id number] Date of Birth: 07-02-1940  [facility]) CM/SW Contact:    Racheal LITTIE Schimke, RN Phone Number: 12/28/2023, 12:50 PM  Clinical Narrative: CM spoke with patient and Granddaughter at bedside. Patient a&o x4 in NAD. Lives with daughter and SIL in [facility], patient says she's treated like a queen. Granddaughter says she has her own room and remodeled bathroom. Uses 4-prong cane and rollator. Uses [employer] for Oxygen, now unable to contact for service to replace broken concentrator and agrees to [employer] for oxygen need. Patient voices no other needs, granddaughter will transport home.                       Patient Goals and CMS Choice            Expected Discharge Plan and Services                                              Prior Living Arrangements/Services                       Activities of Daily Living   ADL Screening (condition at time of admission) Independently performs ADLs?: Yes (appropriate for developmental age) Is the patient deaf or have difficulty hearing?: Yes Does the patient have difficulty seeing, even when wearing glasses/contacts?: No Does the patient have difficulty concentrating, remembering, or making decisions?: No  Permission Sought/Granted                  Emotional Assessment              Admission diagnosis:  Shortness of breath [R06.02] COPD exacerbation (HCC) [J44.1] Patient Active Problem List   Diagnosis Date Noted   COPD exacerbation (HCC) 12/27/2023   Hyperlipidemia 12/27/2023   Thoracic ascending aortic aneurysm (HCC) 08/18/2023   MDD (major depressive disorder), recurrent episode, moderate (HCC) 03/14/2023   Palmar nodule, right 02/15/2023   Polypharmacy 02/14/2023   Allergic rhinitis 08/17/2022   Earlobe lesion, left 08/17/2022   Skin lesion of right leg 08/17/2022   Aortic valve  sclerosis 08/17/2022   Carotid bruit 08/17/2022   Encounter for general adult medical examination with abnormal findings 08/13/2022   Chronic combined systolic and diastolic heart failure (HCC) 06/05/2022   NSTEMI (non-ST elevated myocardial infarction) (HCC) 06/04/2022   Prediabetes 01/31/2022   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   Hearing loss of both ears due to cerumen impaction 07/11/2020   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/18/2020   History of CVA (cerebrovascular accident) without residual deficits 05/18/2020   COPD (chronic obstructive pulmonary disease) (HCC) 08/19/2019   Advance directive discussed with patient 11/09/2018   Primary hyperparathyroidism (HCC) 04/13/2018   Mild persistent asthma 04/07/2018   Hypertension 04/07/2018   Hx of breast cancer 04/07/2018   CAD (coronary artery disease) 04/07/2018   PCP:  Rilla Baller, MD Pharmacy:   [id number] [place], [place] - [address] [place] [address] Phone: [phone] Fax: [phone]     Social Drivers of Health (SDOH) Social History: SDOH Screenings   Food Insecurity: No Food Insecurity (12/27/2023)  Housing:  Low Risk  (12/27/2023)  Transportation Needs: No Transportation Needs (12/27/2023)  Utilities: Not At Risk (12/27/2023)  Alcohol Screen: Low Risk  (07/01/2023)  Depression (PHQ2-9): Low Risk  (07/01/2023)  Recent Concern: Depression (PHQ2-9) - High Risk (04/29/2023)  Financial Resource Strain: Low Risk  (07/01/2023)  Physical Activity: Inactive (07/01/2023)  Social Connections: Moderately Isolated (12/27/2023)  Stress: No Stress Concern Present (07/01/2023)  Tobacco Use: Medium Risk (12/27/2023)  Health Literacy: Adequate Health Literacy (07/01/2023)   SDOH Interventions:     Readmission Risk Interventions     No data to display

## 2023-12-28 NOTE — Care Management Obs Status (Signed)
 MEDICARE OBSERVATION STATUS NOTIFICATION   Patient Details  Name: Marlinda Miranda MRN: [id number] Date of Birth: [birth year]   Medicare Observation Status Notification Given:  Chaney BRANDY CHRISTIANE LELON, CMA 12/28/2023, 11:22 AM

## 2023-12-28 NOTE — Plan of Care (Signed)

## 2023-12-29 DIAGNOSIS — J441 Chronic obstructive pulmonary disease with (acute) exacerbation: Secondary | ICD-10-CM | POA: Diagnosis not present

## 2023-12-29 LAB — BASIC METABOLIC PANEL WITH GFR
Anion gap: 8 (ref 5–15)
BUN: 18 mg/dL (ref 8–23)
CO2: 26 mmol/L (ref 22–32)
Calcium: 9.8 mg/dL (ref 8.9–10.3)
Chloride: 104 mmol/L (ref 98–111)
Creatinine, Ser: 0.53 mg/dL (ref 0.44–1.00)
GFR, Estimated: >60 mL/min (ref >60)
Glucose, Bld: 125 mg/dL — ABNORMAL HIGH (ref 70–99)
Potassium: 3.4 mmol/L — ABNORMAL LOW (ref 3.5–5.1)
Sodium: 138 mmol/L (ref 135–145)

## 2023-12-29 MED ORDER — PREDNISONE 20 MG PO TABS: 40.0000 mg | ORAL_TABLET | Freq: Every day | ORAL | 0 refills | Status: AC

## 2023-12-29 NOTE — TOC Progression Note (Signed)
 Transition of Care [facility]) - Progression Note    Patient Details  Name: Nancy Merritt MRN: [id number] Date of Birth: 1941/04/18  [facility]) CM/SW Contact  Corean ONEIDA Haddock, RN Phone Number: 12/29/2023, 10:10 AM  Clinical Narrative:            Referral made to Mitch with [employer] for home O2            Expected Discharge Plan and Services                                               Social Drivers of Health (SDOH) Interventions SDOH Screenings   Food Insecurity: No Food Insecurity (12/27/2023)  Housing: Low Risk  (12/27/2023)  Transportation Needs: No Transportation Needs (12/27/2023)  Utilities: Not At Risk (12/27/2023)  Alcohol Screen: Low Risk  (07/01/2023)  Depression (PHQ2-9): Low Risk  (07/01/2023)  Recent Concern: Depression (PHQ2-9) - High Risk (04/29/2023)  Financial Resource Strain: Low Risk  (07/01/2023)  Physical Activity: Inactive (07/01/2023)  Social Connections: Moderately Isolated (12/27/2023)  Stress: No Stress Concern Present (07/01/2023)  Tobacco Use: Medium Risk (12/27/2023)  Health Literacy: Adequate Health Literacy (07/01/2023)    Readmission Risk Interventions     No data to display

## 2023-12-29 NOTE — Discharge Summary (Signed)
 Physician Discharge Summary  Nancy Merritt FMW:[id number] DOB: Jul 23, 1940 DOA: 12/27/2023  PCP: Rilla Baller, MD  Admit date: 12/27/2023 Discharge date: 12/29/2023  Admitted From: Home Disposition:  Home  Recommendations for Outpatient Follow-up:  Follow up with PCP in 1-2 weeks   Home Health:No Equipment/Devices:Oxygen 2L via [place]  Discharge Condition:Stable  CODE STATUS:DNR-limited  Diet recommendation: Reg  Brief/Interim Summary:   83 year old female with history of asthma, COPD on chronic 2 L nasal cannula, CAD, depression, hyperlipidemia, hypertension, essential thrombocytosis with CALR mutation, who presents ED for chief concerns of shortness of breath.   Patient's dog jumped on her last night and broke her oxygen concentrator.  Patient has been short of breath and hypoxic since.   She reports that her dog had jumped on her last night and broke her oxygen concentrator.  They had no backup plans in terms of oxygen.  She has been increasingly short of breath since then.   At bedside, she reports her shortness of breath is much improved.  She reports she wears 2 L nasal cannula continuously.  She denies other trauma to her person.  She denies nausea, vomiting, chest pain, abdominal pain, double vision, syncope, loss of consciousness, swelling of her lower extremities, dysuria, hematuria, diarrhea, blood in her stool.  She denies cough, dysphagia.    Discharge Diagnoses:  Principal Problem:   COPD exacerbation (HCC) Active Problems:   Hypertension   CAD (coronary artery disease)   Primary hyperparathyroidism (HCC)   COPD (chronic obstructive pulmonary disease) (HCC)   History of CVA (cerebrovascular accident) without residual deficits   Essential thrombocytosis (HCC)   Polypharmacy   MDD (major depressive disorder), recurrent episode, moderate (HCC)   Hyperlipidemia  * COPD exacerbation (HCC) Secondary to oxygen concentrator broken by her dog Wheezing resolved.   Back to 2 L at baseline rate oxygen Plan: Discontinue IV Solu-Medrol .  Start p.o. prednisone .  40 mg daily x 5 days.  Resume home bronchodilator regimen.  Oxygen reordered via [employer].  Stable for DC.  Follow-up outpatient PCP.      Discharge Instructions  Discharge Instructions     Diet - low sodium heart healthy   Complete by: As directed    Increase activity slowly   Complete by: As directed       Allergies as of 12/29/2023       Reactions   Advair Diskus [fluticasone -salmeterol] Shortness Of Breath   As of 09/01/2014. Pt can take Breo   Bee Pollen Other (See Comments)   Budesonide -formoterol Fumarate Other (See Comments)   Hard time breathing as of 10/31/2014.   Levofloxacin Other (See Comments)   Nausea, brain fog, depression, as of 12/12/14   Pollen Extract Cough   Qvar [beclomethasone] Other (See Comments)   Lungs closing-as of 05/03/2015   Singulair [montelukast] Shortness Of Breath        Medication List     STOP taking these medications    Breo Ellipta  200-25 MCG/ACT Aepb Generic drug: fluticasone  furoate-vilanterol   ipratropium-albuterol  0.5-2.5 (3) MG/3ML Soln Commonly known as: DUONEB   mupirocin  ointment 2 % Commonly known as: BACTROBAN    Spiriva  Respimat 2.5 MCG/ACT Aers Generic drug: Tiotropium Bromide  Monohydrate       TAKE these medications    acetaminophen  500 MG tablet Commonly known as: TYLENOL  Take 1 tablet (500 mg total) by mouth 3 (three) times daily as needed for moderate pain.   albuterol  108 (90 Base) MCG/ACT inhaler Commonly known as: VENTOLIN  HFA INHALE 2 PUFFS INTO THE  LUNGS EVERY 4 HOURS AS NEEDED FOR WHEEZING OR SHORTNESS OF BREATH. What changed: Another medication with the same name was removed. Continue taking this medication, and follow the directions you see here.   arformoterol  15 MCG/2ML Nebu Commonly known as: BROVANA  Take 2 mLs (15 mcg total) by nebulization 2 (two) times daily.   aspirin  81 MG chewable  tablet Chew 1 tablet (81 mg total) by mouth daily.   budesonide  0.5 MG/2ML nebulizer solution Commonly known as: PULMICORT  Take 2 mLs (0.5 mg total) by nebulization 2 (two) times daily.   carvedilol  6.25 MG tablet Commonly known as: COREG  TAKE 1 TABLET BY MOUTH 2 TIMES DAILY WITH A MEAL.   clopidogrel  75 MG tablet Commonly known as: PLAVIX  Take 1 tablet (75 mg total) by mouth daily with breakfast.   doxycycline  100 MG tablet Commonly known as: VIBRA -TABS Take 1 tablet (100 mg total) by mouth 2 (two) times daily.   Entresto  24-26 MG Generic drug: sacubitril -valsartan  Take 2 tablets by mouth 2 (two) times daily.   ezetimibe  10 MG tablet Commonly known as: ZETIA  TAKE 1 TABLET BY MOUTH EVERY DAY   fluticasone  50 MCG/ACT nasal spray Commonly known as: FLONASE  Place 2 sprays into both nostrils daily.   hydroxyurea  500 MG capsule Commonly known as: HYDREA  Take 3 capsules (1,500 mg total) by mouth daily. May take with food to minimize GI side effects.   loratadine  10 MG tablet Commonly known as: CLARITIN  Take 10 mg by mouth daily.   multivitamin tablet Take 1 tablet by mouth daily.   nitroGLYCERIN  0.4 MG SL tablet Commonly known as: NITROSTAT  Place 1 tablet (0.4 mg total) under the tongue every 5 (five) minutes as needed for chest pain.   predniSONE  20 MG tablet Commonly known as: DELTASONE  Take 2 tablets (40 mg total) by mouth daily with breakfast for 4 days. Start taking on: December 30, 2023 What changed:  how much to take additional instructions   PreserVision AREDS 2 Caps Take 2 each by mouth daily at 12 noon.   roflumilast  500 MCG Tabs tablet Commonly known as: DALIRESP  Take 1 tablet (500 mcg total) by mouth daily.   rosuvastatin  20 MG tablet Commonly known as: CRESTOR  Take 1 tablet (20 mg total) by mouth at bedtime.   sertraline  100 MG tablet Commonly known as: ZOLOFT  Take 1 tablet (100 mg total) by mouth daily.   Vitamin D3 25 MCG (1000 UT)  Caps Take 2 capsules (2,000 Units total) by mouth daily.               Durable Medical Equipment  (From admission, onward)           Start     Ordered   12/28/23 1202  For home use only DME oxygen  Once       Question Answer Comment  Length of Need Lifetime   Mode or (Route) Nasal cannula   Liters per Minute 2   Frequency Continuous (stationary and portable oxygen unit needed)   Oxygen conserving device Yes   Oxygen delivery system Gas      12/28/23 1201            Allergies  Allergen Reactions   Advair Diskus [Fluticasone -Salmeterol] Shortness Of Breath    As of 09/01/2014. Pt can take Breo   Bee Pollen Other (See Comments)   Budesonide -Formoterol Fumarate Other (See Comments)    Hard time breathing as of 10/31/2014.   Levofloxacin Other (See Comments)    Nausea, brain fog,  depression, as of 12/12/14   Pollen Extract Cough   Qvar [Beclomethasone] Other (See Comments)    Lungs closing-as of 05/03/2015   Singulair [Montelukast] Shortness Of Breath    Consultations: None   Procedures/Studies: DG Chest 2 View Result Date: 12/27/2023 EXAM: 2 VIEW XRAY OF THE CHEST 12/27/2023 11:31:00 AM COMPARISON: 2 view chest x-ray 08/27/2023. CLINICAL HISTORY: Patient with history of CHF and COPD presents with shortness of breath. Oxygen concentrator malfunctioned, and patient usually requires 2L oxygen. Family is working on obtaining another oxygen concentrator for home. FINDINGS: LUNGS AND PLEURA: No focal pulmonary opacity. No pulmonary edema. No pleural effusion. No pneumothorax. Changes of COPD are stable. HEART AND MEDIASTINUM: No acute abnormality of the cardiac and mediastinal silhouettes. 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 BONES AND SOFT TISSUES: No acute osseous abnormality. Mild rightward curvature is again noted in the thoracic spine. IMPRESSION: 1. Stable changes of COPD. Electronically signed by: Lonni Necessary MD 12/27/2023 11:41 AM EDT  RP Workstation: [id number]      Subjective: Seen and examined on the day of discharge.  Stable no distress.  Appropriate for discharge home.  Discharge Exam: Vitals:   12/29/23 0724 12/29/23 0755  BP:  127/68  Pulse:  78  Resp:  14  Temp:  98 F (36.7 C)  SpO2: 97% 99%   Vitals:   12/28/23 1942 12/29/23 0333 12/29/23 0724 12/29/23 0755  BP:  (!) 130/55  127/68  Pulse:  72  78  Resp:  16  14  Temp:  98.4 F (36.9 C)  98 F (36.7 C)  TempSrc:      SpO2: 95% 98% 97% 99%  Weight:      Height:        General: Pt is alert, awake, not in acute distress Cardiovascular: RRR, S1/S2 +, no rubs, no gallops Respiratory: CTA bilaterally, no wheezing, no rhonchi Abdominal: Soft, NT, ND, bowel sounds + Extremities: no edema, no cyanosis    The results of significant diagnostics from this hospitalization (including imaging, microbiology, ancillary and laboratory) are listed below for reference.     Microbiology: No results found for this or any previous visit (from the past 240 hours).   Labs: BNP (last 3 results) Recent Labs    05/26/23 2310 08/27/23 1030 12/27/23 1118  BNP 50.4 79.4 99.0   Basic Metabolic Panel: Recent Labs  Lab 12/27/23 1118 12/28/23 0414 12/29/23 0825  NA 139 139 138  K 3.1* 3.2* 3.4*  CL 104 107 104  CO2 28 27 26   GLUCOSE 146* 124* 125*  BUN 19 16 18   CREATININE 0.61 0.53 0.53  CALCIUM  9.9 9.6 9.8  MG 2.0  --   --    Liver Function Tests: No results for input(s): AST, ALT, ALKPHOS, BILITOT, PROT, ALBUMIN in the last 168 hours. No results for input(s): LIPASE, AMYLASE in the last 168 hours. No results for input(s): AMMONIA in the last 168 hours. CBC: Recent Labs  Lab 12/27/23 1118 12/28/23 0414  WBC 6.3 6.0  HGB 12.2 11.8*  HCT 36.2 35.6*  MCV 104.0* 105.3*  PLT 703* 752*   Cardiac Enzymes: No results for input(s): CKTOTAL, CKMB, CKMBINDEX, TROPONINI in the last 168 hours. BNP: Invalid input(s):  POCBNP CBG: No results for input(s): GLUCAP in the last 168 hours. D-Dimer No results for input(s): DDIMER in the last 72 hours. Hgb A1c No results for input(s): HGBA1C in the last 72 hours. Lipid Profile No results for input(s): CHOL, HDL, LDLCALC,  TRIG, CHOLHDL, LDLDIRECT in the last 72 hours. Thyroid  function studies No results for input(s): TSH, T4TOTAL, T3FREE, THYROIDAB in the last 72 hours.  Invalid input(s): FREET3 Anemia work up No results for input(s): VITAMINB12, FOLATE, FERRITIN, TIBC, IRON, RETICCTPCT in the last 72 hours. Urinalysis    Component Value Date/Time   COLORURINE YELLOW 05/26/2023 1817   APPEARANCEUR HAZY (A) 05/26/2023 1817   LABSPEC 1.023 05/26/2023 1817   PHURINE 5.0 05/26/2023 1817   GLUCOSEU NEGATIVE 05/26/2023 1817   HGBUR NEGATIVE 05/26/2023 1817   BILIRUBINUR NEGATIVE 05/26/2023 1817   BILIRUBINUR negative 05/26/2023 1418   BILIRUBINUR Negative 06/14/2020 1457   KETONESUR NEGATIVE 05/26/2023 1817   KETONESUR negative 05/26/2023 1418   PROTEINUR NEGATIVE 05/26/2023 1817   PROTEINUR trace (A) 05/26/2023 1418   UROBILINOGEN 0.2 05/26/2023 1418   NITRITE NEGATIVE 05/26/2023 1817   NITRITE Negative 05/26/2023 1418   LEUKOCYTESUR NEGATIVE 05/26/2023 1817   LEUKOCYTESUR Negative 05/26/2023 1418   Sepsis Labs Recent Labs  Lab 12/27/23 1118 12/28/23 0414  WBC 6.3 6.0   Microbiology No results found for this or any previous visit (from the past 240 hours).   Time coordinating discharge: 40 minutes  SIGNED:   Calvin KATHEE Robson, MD  [facility] 12/29/2023, 11:57 AM Pager   If 7PM-7AM, please contact night-coverage

## 2023-12-29 NOTE — Progress Notes (Signed)
 Mobility Specialist - Progress Note  Pre-mobility: SpO2 96% During mobility: HR 83, SpO2 95% Post-mobility: HR 86, SPO2 95%   12/29/23 1156  Mobility  Activity Ambulated with assistance  Level of Assistance Standby assist, set-up cues, supervision of patient - no hands on  Assistive Device Front wheel walker  Distance Ambulated (ft) 160 ft  Activity Response Tolerated well  Mobility visit 1 Mobility  Mobility Specialist Start Time (ACUTE ONLY) 1137  Mobility Specialist Stop Time (ACUTE ONLY) 1149  Mobility Specialist Time Calculation (min) (ACUTE ONLY) 12 min   Pt supine upon entry, utilizing RA. Pt motivated and agreeable to OOB amb this date. Pt completed bed mob indep, STS to RW and amb one lap around the NS with supervision--- min SOB and fatigue upon return to the room. Pt returned to the room, left seated EOB with alarm set and needs within reach.   America Silvan Mobility Specialist 12/29/23 12:04 PM

## 2023-12-29 NOTE — TOC Transition Note (Signed)
 Transition of Care [facility]) - Discharge Note   Patient Details  Name: Nancy Merritt MRN: [id number] Date of Birth: December 21, 1940  Transition of Care [facility]) CM/SW Contact:  Corean ONEIDA Haddock, RN Phone Number: 12/29/2023, 11:47 AM   Clinical Narrative:    Patient to dc today No PT OT follow up indicated Referral made to Mitch with [employer] for O2.  Portable tank to be delivered to room prior to dc          Patient Goals and CMS Choice            Discharge Placement                       Discharge Plan and Services Additional resources added to the After Visit Summary for                                       Social Drivers of Health (SDOH) Interventions SDOH Screenings   Food Insecurity: No Food Insecurity (12/27/2023)  Housing: Low Risk  (12/27/2023)  Transportation Needs: No Transportation Needs (12/27/2023)  Utilities: Not At Risk (12/27/2023)  Alcohol Screen: Low Risk  (07/01/2023)  Depression (PHQ2-9): Low Risk  (07/01/2023)  Recent Concern: Depression (PHQ2-9) - High Risk (04/29/2023)  Financial Resource Strain: Low Risk  (07/01/2023)  Physical Activity: Inactive (07/01/2023)  Social Connections: Moderately Isolated (12/27/2023)  Stress: No Stress Concern Present (07/01/2023)  Tobacco Use: Medium Risk (12/27/2023)  Health Literacy: Adequate Health Literacy (07/01/2023)     Readmission Risk Interventions     No data to display

## 2023-12-29 NOTE — Plan of Care (Signed)

## 2023-12-31 ENCOUNTER — Telehealth: Payer: Self-pay

## 2023-12-31 NOTE — Transitions of Care (Post Inpatient/ED Visit) (Signed)
 12/31/2023  Name: Nancy Merritt MRN: [id number] DOB: [birth year]  Today's TOC FU Call Status: Today's TOC FU Call Status:: Successful TOC FU Call Completed TOC FU Call Complete Date: 12/31/23 Patient's Name and Date of Birth confirmed.  Transition Care Management Follow-up Telephone Call Date of Discharge: 12/29/23 Discharge Facility: [facility]) Type of Discharge: Inpatient Admission Primary Inpatient Discharge Diagnosis:: COPD How have you been since you were released from the hospital?: Better Any questions or concerns?: No  Items Reviewed: Did you receive and understand the discharge instructions provided?: Yes Medications obtained,verified, and reconciled?: Yes (Medications Reviewed) Any new allergies since your discharge?: No Dietary orders reviewed?: Yes Do you have support at home?: Yes People in Home [RPT]: grandchild(ren)  Medications Reviewed Today: Medications Reviewed Today     Reviewed by Emmitt Pan, LPN (Licensed Practical Nurse) on 12/31/23 at 1415  Med List Status: <None>   Medication Order Taking? Sig Documenting Provider Last Dose Status Informant  acetaminophen  (TYLENOL ) 500 MG tablet [id number] Yes Take 1 tablet (500 mg total) by mouth 3 (three) times daily as needed for moderate pain. Rilla Baller, MD  Active Self, Child, Pharmacy Records           Med Note NIKKI, [place]   Dju Dec 27, 2023  2:44 PM) prn  albuterol  (VENTOLIN  HFA) 108 [phone] Base) MCG/ACT inhaler [id number] Yes INHALE 2 PUFFS INTO THE LUNGS EVERY 4 HOURS AS NEEDED FOR WHEEZING OR SHORTNESS OF BREATH. Rilla Baller, MD  Active Self, Child, Pharmacy Records           Med Note NIKKI, [place]   Dju Dec 27, 2023  2:45 PM) prn  arformoterol  (BROVANA ) 15 MCG/2ML NEBU [id number] Yes Take 2 mLs (15 mcg total) by nebulization 2 (two) times daily. Isaiah Scrivener, MD  Active Self, Child, Pharmacy Records  aspirin  81 MG chewable tablet [id number] Yes Chew 1 tablet  (81 mg total) by mouth daily. Janene Boer, [place]  Active Self, Child, Pharmacy Records  budesonide  (PULMICORT ) 0.5 MG/2ML nebulizer solution [id number] Yes Take 2 mLs (0.5 mg total) by nebulization 2 (two) times daily. Kasa, Kurian, MD  Active Self, Child, Pharmacy Records  carvedilol  (COREG ) 6.25 MG tablet [id number] Yes TAKE 1 TABLET BY MOUTH 2 TIMES DAILY WITH A MEAL. Janene Boer, PA  Active Self, Child, Pharmacy Records  Cholecalciferol  (VITAMIN D3) 25 MCG (1000 UT) capsule [id number] Yes Take 2 capsules (2,000 Units total) by mouth daily. Rilla Baller, MD  Active Self, Child, Pharmacy Records  clopidogrel  (PLAVIX ) 75 MG tablet [id number] Yes Take 1 tablet (75 mg total) by mouth daily with breakfast. Okey Vina GAILS, MD  Active Self, Child, Pharmacy Records  doxycycline  (VIBRA -TABS) 100 MG tablet [id number] Yes Take 1 tablet (100 mg total) by mouth 2 (two) times daily. Tamea Dedra CROME, MD  Active Self, Child, Pharmacy Records  ezetimibe  (ZETIA ) 10 MG tablet [id number] Yes TAKE 1 TABLET BY MOUTH EVERY DAY Janene Boer, [place]  Active Self, Child, Pharmacy Records  fluticasone  (FLONASE ) 50 MCG/ACT nasal spray [id number] Yes Place 2 sprays into both nostrils daily. Rilla Baller, MD  Active Self, Child, Pharmacy Records  hydroxyurea  (HYDREA ) 500 MG capsule [id number] Yes Take 3 capsules (1,500 mg total) by mouth daily. May take with food to minimize GI side effects. Jacobo Evalene PARAS, MD  Active Self, Child, Pharmacy Records  loratadine  (CLARITIN ) 10 MG tablet [id number] Yes Take 10 mg by mouth daily. [provider]  Active Self, Child, Pharmacy Records  Multiple Vitamin (MULTIVITAMIN)  tablet [id number] Yes Take 1 tablet by mouth daily. [provider]  Active Self, Child, Pharmacy Records  Multiple Vitamins-Minerals (PRESERVISION AREDS 2) CAPS [id number] Yes Take 2 each by mouth daily at 12 noon. [provider]  Active Self, Child, Pharmacy Records  nitroGLYCERIN  (NITROSTAT ) 0.4 MG SL  tablet [id number] Yes Place 1 tablet (0.4 mg total) under the tongue every 5 (five) minutes as needed for chest pain. Janene Boer, PA  Active Self, Child, Pharmacy Records           Med Note NIKKI, [place]   Dju Dec 27, 2023  2:45 PM) prn  predniSONE  (DELTASONE ) 20 MG tablet [id number] Yes Take 2 tablets (40 mg total) by mouth daily with breakfast for 4 days. Jhonny Calvin NOVAK, MD  Active   roflumilast  (DALIRESP ) 500 MCG TABS tablet [id number] Yes Take 1 tablet (500 mcg total) by mouth daily. Kasa, Kurian, MD  Active Self, Child, Pharmacy Records  rosuvastatin  (CRESTOR ) 20 MG tablet [id number] Yes Take 1 tablet (20 mg total) by mouth at bedtime. Janene Boer, [place]  Active Self, Child, Pharmacy Records  sacubitril -valsartan  (ENTRESTO ) 24-26 MG [id number] Yes Take 2 tablets by mouth 2 (two) times daily. Okey Vina GAILS, MD  Active Self, Child, Pharmacy Records           Med Note [facility], [facility]   Tue Jul 01, 2023  8:57 AM) Pt says taking 1 tablet bid  sertraline  (ZOLOFT ) 100 MG tablet [id number] Yes Take 1 tablet (100 mg total) by mouth daily. Rilla Baller, MD  Active Self, Child, Pharmacy Records            Home Care and Equipment/Supplies: Were Home Health Services Ordered?: NA Any new equipment or medical supplies ordered?: NA  Functional Questionnaire: Do you need assistance with bathing/showering or dressing?: No Do you need assistance with meal preparation?: No Do you need assistance with eating?: No Do you have difficulty maintaining continence: No Do you need assistance with getting out of bed/getting out of a chair/moving?: No Do you have difficulty managing or taking your medications?: No  Follow up appointments reviewed: PCP Follow-up appointment confirmed?: No (declined appt,,daughter will call back) MD Provider Line Number:[phone] Given: No Specialist Hospital Follow-up appointment confirmed?: NA Do you need transportation to your follow-up appointment?: No Do you  understand care options if your condition(s) worsen?: Yes-patient verbalized understanding    SIGNATURE Julian Lemmings, LPN [facility] Nurse Health Advisor Direct Dial [phone]

## 2024-01-07 DIAGNOSIS — J449 Chronic obstructive pulmonary disease, unspecified: Secondary | ICD-10-CM | POA: Diagnosis not present

## 2024-01-07 DIAGNOSIS — J9611 Chronic respiratory failure with hypoxia: Secondary | ICD-10-CM | POA: Diagnosis not present

## 2024-01-21 DIAGNOSIS — J479 Bronchiectasis, uncomplicated: Secondary | ICD-10-CM | POA: Diagnosis not present

## 2024-01-29 DIAGNOSIS — Z9622 Myringotomy tube(s) status: Secondary | ICD-10-CM | POA: Diagnosis not present

## 2024-01-29 DIAGNOSIS — H93293 Other abnormal auditory perceptions, bilateral: Secondary | ICD-10-CM | POA: Diagnosis not present

## 2024-01-29 DIAGNOSIS — H90A32 Mixed conductive and sensorineural hearing loss, unilateral, left ear with restricted hearing on the contralateral side: Secondary | ICD-10-CM | POA: Diagnosis not present

## 2024-01-29 DIAGNOSIS — H9222 Otorrhagia, left ear: Secondary | ICD-10-CM | POA: Diagnosis not present

## 2024-02-05 ENCOUNTER — Other Ambulatory Visit: Payer: Self-pay | Admitting: Internal Medicine

## 2024-02-05 DIAGNOSIS — J9611 Chronic respiratory failure with hypoxia: Secondary | ICD-10-CM

## 2024-02-05 DIAGNOSIS — J449 Chronic obstructive pulmonary disease, unspecified: Secondary | ICD-10-CM

## 2024-02-05 DIAGNOSIS — J479 Bronchiectasis, uncomplicated: Secondary | ICD-10-CM

## 2024-02-05 DIAGNOSIS — R0602 Shortness of breath: Secondary | ICD-10-CM

## 2024-02-07 DIAGNOSIS — J449 Chronic obstructive pulmonary disease, unspecified: Secondary | ICD-10-CM | POA: Diagnosis not present

## 2024-02-07 DIAGNOSIS — J9611 Chronic respiratory failure with hypoxia: Secondary | ICD-10-CM | POA: Diagnosis not present

## 2024-02-09 ENCOUNTER — Telehealth: Payer: Self-pay | Admitting: Oncology

## 2024-02-09 NOTE — Telephone Encounter (Signed)
 Voicemail left in regards to pt appts on 10/31. Pt daughter would like to be called to r/s appt as they will be out of town.  Callback number [phone]

## 2024-02-12 ENCOUNTER — Encounter: Payer: Self-pay | Admitting: Pharmacist

## 2024-02-12 NOTE — Progress Notes (Signed)
 Brief Telephone Documentation Reason for Call: Patient's daughter left message regarding question for pharmacist   Summary of Call: Voicemail related to concerns with prescription costs (enrolled in PAP for 2025). Returned call and left voicemail with direct call-back number   Follow Up: Patient given direct line for further questions/concerns.  Manuelita FABIENE Kobs, PharmD Clinical Pharmacist [facility] [phone]

## 2024-02-13 DIAGNOSIS — H6993 Unspecified Eustachian tube disorder, bilateral: Secondary | ICD-10-CM | POA: Diagnosis not present

## 2024-02-13 DIAGNOSIS — H9222 Otorrhagia, left ear: Secondary | ICD-10-CM | POA: Diagnosis not present

## 2024-02-13 DIAGNOSIS — H93293 Other abnormal auditory perceptions, bilateral: Secondary | ICD-10-CM | POA: Diagnosis not present

## 2024-02-13 DIAGNOSIS — H90A32 Mixed conductive and sensorineural hearing loss, unilateral, left ear with restricted hearing on the contralateral side: Secondary | ICD-10-CM | POA: Diagnosis not present

## 2024-02-13 DIAGNOSIS — H6991 Unspecified Eustachian tube disorder, right ear: Secondary | ICD-10-CM | POA: Diagnosis not present

## 2024-02-13 DIAGNOSIS — Z9622 Myringotomy tube(s) status: Secondary | ICD-10-CM | POA: Diagnosis not present

## 2024-02-16 ENCOUNTER — Telehealth: Payer: Self-pay | Admitting: Pharmacist

## 2024-02-16 NOTE — Progress Notes (Signed)
 Brief Telephone Documentation Reason for Call: Patient's daughter left message regarding question for pharmacist   Summary of Call: Spoke with patient this afternoon. She reports her mother had questions regarding medication access. She believes this is related to re-enrollment for next year, though may be related to other medications. Also noted question about HTA though cannot recall what her mother's question was. Would like to schedule appointment to discuss.    Follow Up: Scheduled for phone visit fir med access Patient given direct line for further questions/concerns.   Manuelita FABIENE Kobs, PharmD Clinical Pharmacist [facility] [phone]

## 2024-02-17 ENCOUNTER — Encounter: Payer: Self-pay | Admitting: Dermatology

## 2024-02-17 ENCOUNTER — Ambulatory Visit: Admitting: Dermatology

## 2024-02-17 DIAGNOSIS — L570 Actinic keratosis: Secondary | ICD-10-CM | POA: Diagnosis not present

## 2024-02-17 DIAGNOSIS — Z1283 Encounter for screening for malignant neoplasm of skin: Secondary | ICD-10-CM | POA: Diagnosis not present

## 2024-02-17 DIAGNOSIS — L814 Other melanin hyperpigmentation: Secondary | ICD-10-CM

## 2024-02-17 DIAGNOSIS — D229 Melanocytic nevi, unspecified: Secondary | ICD-10-CM

## 2024-02-17 DIAGNOSIS — L578 Other skin changes due to chronic exposure to nonionizing radiation: Secondary | ICD-10-CM

## 2024-02-17 DIAGNOSIS — L821 Other seborrheic keratosis: Secondary | ICD-10-CM | POA: Diagnosis not present

## 2024-02-17 DIAGNOSIS — D692 Other nonthrombocytopenic purpura: Secondary | ICD-10-CM

## 2024-02-17 DIAGNOSIS — Z85828 Personal history of other malignant neoplasm of skin: Secondary | ICD-10-CM

## 2024-02-17 DIAGNOSIS — D1801 Hemangioma of skin and subcutaneous tissue: Secondary | ICD-10-CM

## 2024-02-17 DIAGNOSIS — W908XXA Exposure to other nonionizing radiation, initial encounter: Secondary | ICD-10-CM

## 2024-02-17 NOTE — Patient Instructions (Addendum)

## 2024-02-17 NOTE — Progress Notes (Unsigned)
 Follow-Up Visit   Subjective  Nancy Merritt is a 83 y.o. female who presents for the following: Skin Cancer Screening and Full Body Skin Exam  The patient presents for Total-Body Skin Exam (TBSE) for skin cancer screening and mole check. The patient has spots, moles and lesions to be evaluated, some may be new or changing and the patient may have concern these could be cancer.  The following portions of the chart were reviewed this encounter and updated as appropriate: medications, allergies, medical history  Review of Systems:  No other skin or systemic complaints except as noted in HPI or Assessment and Plan.  Objective  Well appearing patient in no apparent distress; mood and affect are within normal limits.  A full examination was performed including scalp, head, eyes, ears, nose, lips, neck, chest, axillae, abdomen, back, buttocks, bilateral upper extremities, bilateral lower extremities, hands, feet, fingers, toes, fingernails, and toenails. All findings within normal limits unless otherwise noted below.   Relevant physical exam findings are noted in the Assessment and Plan.  L sup ear helix x 1 Pink scaly macules  Assessment & Plan   SKIN CANCER SCREENING PERFORMED TODAY.  ACTINIC DAMAGE - Chronic condition, secondary to cumulative UV/sun exposure - diffuse scaly erythematous macules with underlying dyspigmentation - Recommend daily broad spectrum sunscreen SPF 30+ to sun-exposed areas, reapply every 2 hours as needed.  - Staying in the shade or wearing long sleeves, sun glasses (UVA+UVB protection) and wide brim hats (4-inch brim around the entire circumference of the hat) are also recommended for sun protection.  - Call for new or changing lesions.  LENTIGINES, SEBORRHEIC KERATOSES, HEMANGIOMAS - Benign normal skin lesions - Benign-appearing - Call for any changes  MELANOCYTIC NEVI - Tan-brown and/or pink-flesh-colored symmetric macules and papules - Benign  appearing on exam today - Observation - Call clinic for new or changing moles - Recommend daily use of broad spectrum spf 30+ sunscreen to sun-exposed areas.   HISTORY OF BASAL CELL CARCINOMA OF THE SKIN - No evidence of recurrence today - Recommend regular full body skin exams - Recommend daily broad spectrum sunscreen SPF 30+ to sun-exposed areas, reapply every 2 hours as needed.  - Call if any new or changing lesions are noted between office visits  - R medial pretibial 08/18/23  Purpura - Chronic; persistent and recurrent.  Treatable, but not curable. arms - Violaceous macules and patches - Benign - Related to trauma, age, sun damage and/or use of blood thinners, chronic use of topical and/or oral steroids - Observe - Can use OTC arnica containing moisturizer such as Dermend Bruise Formula if desired - Call for worsening or other concerns  HEMANGIOMA By dermatoscope L medial thigh Exam: red papule L medial thigh Treatment: Discussed benign nature. Recommend observation. Call for changes.   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 They are chronic with expected duration over 1 year. A portion of actinic keratoses will progress to squamous cell carcinoma of the skin. It is not possible to reliably predict which spots will progress to skin cancer and so treatment is recommended to prevent development of skin cancer.  Recommend daily broad spectrum sunscreen SPF 30+ to sun-exposed areas, reapply every 2 hours as needed.  Recommend staying in the shade or wearing long sleeves, sun glasses (UVA+UVB protection) and wide brim hats (4-inch brim around the  entire circumference of the hat). Call for new or changing lesions. Destruction of lesion - L sup ear helix x 1 Complexity: simple   Destruction method: cryotherapy   Informed  consent: discussed and consent obtained   Timeout:  patient name, date of birth, surgical site, and procedure verified Lesion destroyed using liquid nitrogen: Yes   Region frozen until ice ball extended beyond lesion: Yes   Outcome: patient tolerated procedure well with no complications   Post-procedure details: wound care instructions given    Return in about 1 year (around 02/16/2025) for TBSE, Hx of BCC, Hx of AKs.  I, Grayce Saunas, RMA, am acting as scribe for Alm Rhyme, MD .   Documentation: I have reviewed the above documentation for accuracy and completeness, and I agree with the above.  Alm Rhyme, MD

## 2024-02-19 ENCOUNTER — Encounter: Payer: Self-pay | Admitting: Dermatology

## 2024-02-20 DIAGNOSIS — J479 Bronchiectasis, uncomplicated: Secondary | ICD-10-CM | POA: Diagnosis not present

## 2024-02-23 ENCOUNTER — Telehealth: Payer: Self-pay

## 2024-02-23 NOTE — Telephone Encounter (Addendum)
 Will forward attn Dr Ross/cardiology who prescribes Entresto  as FYI.

## 2024-02-23 NOTE — Telephone Encounter (Signed)
 SABRA

## 2024-02-25 NOTE — Telephone Encounter (Signed)
 Patient will need to go to generic Entresto  after Dec 31

## 2024-03-02 DIAGNOSIS — Z974 Presence of external hearing-aid: Secondary | ICD-10-CM | POA: Diagnosis not present

## 2024-03-02 DIAGNOSIS — Z9622 Myringotomy tube(s) status: Secondary | ICD-10-CM | POA: Diagnosis not present

## 2024-03-04 ENCOUNTER — Encounter: Payer: Self-pay | Admitting: Internal Medicine

## 2024-03-04 ENCOUNTER — Ambulatory Visit: Admitting: Internal Medicine

## 2024-03-04 VITALS — BP 110/70 | HR 68 | Temp 97.9°F | Ht 59.0 in | Wt 134.4 lb

## 2024-03-04 DIAGNOSIS — J9611 Chronic respiratory failure with hypoxia: Secondary | ICD-10-CM | POA: Diagnosis not present

## 2024-03-04 DIAGNOSIS — J479 Bronchiectasis, uncomplicated: Secondary | ICD-10-CM

## 2024-03-04 DIAGNOSIS — R0602 Shortness of breath: Secondary | ICD-10-CM

## 2024-03-04 MED ORDER — IPRATROPIUM-ALBUTEROL 0.5-2.5 (3) MG/3ML IN SOLN: 3.0000 mL | RESPIRATORY_TRACT | 10 refills | Status: DC | PRN

## 2024-03-04 NOTE — Patient Instructions (Signed)
 Continue vest therapy 15 minutes twice a day Continue Pulmicort  nebulizers twice a day Continue Brovana  nebulizers twice a day Continue oxygen as prescribed Recommend using DuoNebs(ipratropium albuterol ) nebulizers every 4 hours as needed Continue with Daliresp   Avoid Allergens and Irritants Avoid secondhand smoke Avoid SICK contacts Recommend  Masking  when appropriate Recommend Keep up-to-date with vaccinations

## 2024-03-04 NOTE — Progress Notes (Signed)
 [facility] [place] Pulmonary Medicine Consultation      Date: 03/04/2024,   MRN# [id number] Nancy Merritt May 04, 1941  TESTS Pulmonary function testing 2017 Postbronchodilator FEV1 FVC ratio was 63% predicted FEV1 was 73% predicted Positive bronchodilator response at 21% FVC is 86% predicted TLC was 98% predicted RV 107% predicted Expiratory limb on flow volume loops show significant obstructive pattern Findings are concerning for moderate obstructive lung disease   Ambulatory pulse ox shows progressive dyspnea exertion with hypoxia in the office at 83%, placed on oxygen therapy   Chest x-ray August 27, 2023 No evidence of pneumonia No evidence of edema No effusions Consider hyperexpanded lung possible flattened diaphragms   CHIEF COMPLAINT:   Follow-up assessment for shortness of breath Assessment of chronic bronchitis wheezing bronchiectasis bilaterally Follow-up assessment for chronic hypoxic respiratory failure  HISTORY OF PRESENT ILLNESS   Assessment of chronic bronchitis wheezing and bronchiectasis Symptoms have improved with Daliresp  500 mg daily GI side effects seems to be tolerable Mucus production is significantly improved We will continue current dose  Patient was placed on oxygen therapy at our last office visit Patient continues to wear oxygen as prescribed  No exacerbation at this time No evidence of heart failure at this time No evidence or signs of infection at this time No respiratory distress No fevers, chills, nausea, vomiting, diarrhea No evidence of lower extremity edema No evidence hemoptysis  Smart vest therapy 15 minutes twice a day This has helped her mucus production clearance Patient tolerating well   PAST MEDICAL HISTORY   Past Medical History:  Diagnosis Date   Actinic keratosis    Arthritis    Asthma    Basal cell carcinoma 08/18/2023   right medial pretibial, EDC   Cancer (HCC) 1990   CHF (congestive heart failure) (HCC)     COPD (chronic obstructive pulmonary disease) (HCC)    Coronary artery disease    COVID-19 06/14/2020   Depression    Hx of breast cancer 04/07/2018   1990. No screening in last 25 years   Hypertension      SURGICAL HISTORY   Past Surgical History:  Procedure Laterality Date   BREAST SURGERY Left 1990   CORONARY ARTERY BYPASS GRAFT     JOINT REPLACEMENT Right    Hip   LEFT HEART CATH AND CORS/GRAFTS ANGIOGRAPHY N/A 06/05/2022   Procedure: LEFT HEART CATH AND CORS/GRAFTS ANGIOGRAPHY;  Surgeon: Darron Deatrice LABOR, MD;  Location: [facility] INVASIVE CV LAB;  Service: Cardiovascular;  Laterality: N/A;   OVARIAN CYST REMOVAL  1970   TUBAL LIGATION  1970     FAMILY HISTORY   Family History  Problem Relation Age of Onset   Cancer Father        lung   Heart attack Father 32   Alcohol abuse Father    Arthritis Father    COPD Father    Diabetes Father    Hypertension Father    Breast cancer Sister 58   Arthritis Sister    Cancer Sister    COPD Sister    Drug abuse Sister    Hypertension Sister    Miscarriages / [place] Sister    Thyroid  disease Sister    Arthritis Mother    COPD Mother    Diabetes Mother    Hearing loss Mother    Hypertension Mother    Miscarriages / [place] Mother    Stroke Mother    Alcohol abuse Brother    Arthritis Brother    Hypertension Brother  Arthritis Daughter    Asthma Daughter    Depression Daughter    Hypertension Daughter    Miscarriages / [place] Daughter    Thyroid  disease Daughter    Arthritis Maternal Grandmother    Hypertension Maternal Grandmother    Stroke Maternal Grandmother    Thyroid  disease Maternal Grandmother    Alcohol abuse Maternal Grandfather    Arthritis Maternal Grandfather    Cancer Maternal Grandfather    Depression Maternal Grandfather    Hearing loss Maternal Grandfather    Hypertension Maternal Grandfather    Arthritis Sister    Thyroid  disease Sister    Thyroid  disease Daughter    Hypertension  Daughter    Depression Daughter    COPD Daughter    Alcohol abuse Daughter    Arthritis Daughter    Arthritis Daughter    COPD Daughter    Depression Daughter    Hearing loss Daughter    Hypertension Daughter    Thyroid  disease Daughter      SOCIAL HISTORY   Social History   Tobacco Use   Smoking status: Former    Current packs/day: 0.00    Average packs/day: 1 pack/day for 40.0 years (40.0 ttl pk-yrs)    Types: Cigarettes    Start date: 98    Quit date: 2002    Years since quitting: 23.8   Smokeless tobacco: Never  Vaping Use   Vaping status: Never Used  Substance Use Topics   Alcohol use: Not Currently   Drug use: Never     MEDICATIONS    Home Medication:  Current Outpatient Rx   Order #: [id number] Class: Historical Med   Order #: [id number] Class: OTC   Order #: [id number] Class: Normal   Order #: [id number] Class: Normal   Order #: [id number] Class: OTC   Order #: [id number] Class: Normal   Order #: [id number] Class: Normal   Order #: [id number] Class: OTC   Order #: [id number] Class: Normal   Order #: [id number] Class: Normal   Order #: [id number] Class: Normal   Order #: [id number] Class: Normal   Order #: [id number] Class: Normal   Order #: [id number] Class: Historical Med   Order #: [id number] Class: Historical Med   Order #: [id number] Class: Historical Med   Order #: [id number] Class: Normal   Order #: [id number] Class: Normal   Order #: [id number] Class: Normal   Order #: [id number] Class: No Print   Order #: [id number] Class: Normal    Current Medication:  Current Outpatient Medications:    ipratropium-albuterol  (DUONEB) 0.5-2.5 (3) MG/3ML SOLN, Take 3 mLs by nebulization every 4 (four) hours as needed., Disp: , Rfl:    acetaminophen  (TYLENOL ) 500 MG tablet, Take 1 tablet (500 mg total) by mouth 3 (three) times daily as needed for moderate pain., Disp: , Rfl:    albuterol  (VENTOLIN  HFA) 108 (90 Base) MCG/ACT inhaler, INHALE 2 PUFFS INTO THE LUNGS EVERY 4 HOURS AS NEEDED FOR  WHEEZING OR SHORTNESS OF BREATH., Disp: 6.7 each, Rfl: 3   arformoterol  (BROVANA ) 15 MCG/2ML NEBU, Take 2 mLs (15 mcg total) by nebulization 2 (two) times daily., Disp: 120 mL, Rfl: 12   aspirin  81 MG chewable tablet, Chew 1 tablet (81 mg total) by mouth daily., Disp: , Rfl:    budesonide  (PULMICORT ) 0.5 MG/2ML nebulizer solution, Take 2 mLs (0.5 mg total) by nebulization 2 (two) times daily., Disp: 1440 mL, Rfl: 0   carvedilol  (COREG ) 6.25 MG tablet, TAKE 1 TABLET BY MOUTH 2 TIMES DAILY WITH A MEAL., Disp: 180 tablet, Rfl: 3   Cholecalciferol  (VITAMIN D3) 25 MCG (1000  UT) capsule, Take 2 capsules (2,000 Units total) by mouth daily., Disp: 30 capsule, Rfl:    clopidogrel  (PLAVIX ) 75 MG tablet, Take 1 tablet (75 mg total) by mouth daily with breakfast., Disp: 90 tablet, Rfl: 3   doxycycline  (VIBRA -TABS) 100 MG tablet, Take 1 tablet (100 mg total) by mouth 2 (two) times daily., Disp: 20 tablet, Rfl: 0   ezetimibe  (ZETIA ) 10 MG tablet, TAKE 1 TABLET BY MOUTH EVERY DAY, Disp: 90 tablet, Rfl: 3   fluticasone  (FLONASE ) 50 MCG/ACT nasal spray, Place 2 sprays into both nostrils daily., Disp: 16 g, Rfl: 12   hydroxyurea  (HYDREA ) 500 MG capsule, Take 3 capsules (1,500 mg total) by mouth daily. May take with food to minimize GI side effects., Disp: 270 capsule, Rfl: 2   loratadine  (CLARITIN ) 10 MG tablet, Take 10 mg by mouth daily., Disp: , Rfl:    Multiple Vitamin (MULTIVITAMIN) tablet, Take 1 tablet by mouth daily., Disp: , Rfl:    Multiple Vitamins-Minerals (PRESERVISION AREDS 2) CAPS, Take 2 each by mouth daily at 12 noon., Disp: , Rfl:    nitroGLYCERIN  (NITROSTAT ) 0.4 MG SL tablet, Place 1 tablet (0.4 mg total) under the tongue every 5 (five) minutes as needed for chest pain., Disp: 25 tablet, Rfl: 3   roflumilast  (DALIRESP ) 500 MCG TABS tablet, TAKE 1 TABLET BY MOUTH DAILY, Disp: 90 tablet, Rfl: 3   rosuvastatin  (CRESTOR ) 20 MG tablet, Take 1 tablet (20 mg total) by mouth at bedtime., Disp: 90 tablet,  Rfl: 3   sacubitril -valsartan  (ENTRESTO ) 24-26 MG, Take 2 tablets by mouth 2 (two) times daily., Disp: , Rfl:    sertraline  (ZOLOFT ) 100 MG tablet, Take 1 tablet (100 mg total) by mouth daily., Disp: 90 tablet, Rfl: 4    ALLERGIES   Advair diskus [fluticasone -salmeterol], Bee pollen, Budesonide -formoterol fumarate, Levofloxacin, Pollen extract, Qvar [beclomethasone], and Singulair [montelukast]   BP 110/70   Pulse 68   Temp 97.9 F (36.6 C)   Ht 4' 11 (1.499 m)   Wt 134 lb 6.4 oz (61 kg)   SpO2 96% Comment: on 2 L of o2  BMI 27.15 kg/m    Physical Examination:  General Appearance: No distress  EYES EOM intact.   NECK Supple, No JVD Pulmonary: normal breath sounds, +wheezing.  CardiovascularNormal S1,S2.  No m/r/g.   Ext pulses intact, cap refill intact  ALL OTHER ROS ARE NEGATIVE     CT chest January 2025 reviewed in detail There is evidence of bilateral bronchiectasis This could be contributing to her symptoms      ASSESSMENT/PLAN   83 year old pleasant white female seen today for follow-up assessment for COPD with moderate obstructive pulmonary disease recurrent bouts of COPD exacerbation requiring multiple doses of prednisone  antibiotics also with hypoxic respiratory failure continuing to show signs of chronic bronchitis and underlying bilateral bronchiectasis with a diminished inspiratory capacity with respiratory insufficiency    Assessment COPD Progression of moderate COPD Respiratory insufficiency with inability to complete pulmonary function testing Poor respiratory effort and inspiratory capacity Patient unable to tolerate traditional inhaler therapy Continue Pulmicort  and Brovana  nebulizers DuoNebs every 6 hours  Respiratory insufficiency chronic mucus production Flutter valve 10-15 times per day No evidence of infection Continue Daliresp  500 mg Continue vest therapy 15 minutes twice a day Patient is tolerating therapy Patient states that her  breathing is much improved and her mucus production and clearance has improved   Chronic respiratory sufficiency chronic bronchitis and bronchiectasis Patient with ongoing symptoms Continue chest therapy  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 -Patient benefits from oxygen therapy 2L [place]  -recommend using oxygen as prescribed -patient needs this for survival   MEDICATION ADJUSTMENTS/LABS AND TESTS ORDERED: Avoid Allergens and Irritants Avoid secondhand smoke Avoid SICK contacts Recommend  Masking  when appropriate Recommend Keep up-to-date with vaccinations Continue PULMICORT  NEBS TWICE DAILY CONTINUE BROVANA  NEBS TWICE DAILY Continue Daliresp  FLUTTER VALVE USE 10 times per day Continue oxygen SMART VEST therapy pending   CURRENT MEDICATIONS REVIEWED AT LENGTH WITH PATIENT TODAY   Patient  satisfied with Plan of action and management. All questions answered   Follow up 6 months   I spent a total of 42 minutes dedicated to the care of this patient on the date of this encounter to include pre-visit review of records, face-to-face time with the patient discussing conditions above, post visit ordering of testing, clinical documentation with the electronic health record, making appropriate referrals as documented, and communicating necessary information to the patient's healthcare team.    The Patient requires high complexity decision making for assessment and support, frequent evaluation and titration of therapies, application of advanced monitoring technologies and extensive interpretation of multiple databases.  Patient satisfied with Plan of action and management. All questions answered    Nickolas Alm Cellar, M.D.  [facility] Pulmonary & Critical Care Medicine  Medical Director [facility] 

## 2024-03-05 ENCOUNTER — Other Ambulatory Visit

## 2024-03-08 DIAGNOSIS — J9611 Chronic respiratory failure with hypoxia: Secondary | ICD-10-CM | POA: Diagnosis not present

## 2024-03-19 ENCOUNTER — Other Ambulatory Visit

## 2024-03-19 ENCOUNTER — Ambulatory Visit: Admitting: Oncology

## 2024-03-22 DIAGNOSIS — J479 Bronchiectasis, uncomplicated: Secondary | ICD-10-CM | POA: Diagnosis not present

## 2024-03-25 ENCOUNTER — Inpatient Hospital Stay: Attending: Oncology

## 2024-03-25 ENCOUNTER — Encounter: Payer: Self-pay | Admitting: Oncology

## 2024-03-25 ENCOUNTER — Inpatient Hospital Stay: Admitting: Oncology

## 2024-03-25 VITALS — BP 154/79 | HR 62 | Temp 98.6°F | Resp 18 | Wt 136.7 lb

## 2024-03-25 DIAGNOSIS — D473 Essential (hemorrhagic) thrombocythemia: Secondary | ICD-10-CM | POA: Diagnosis not present

## 2024-03-25 DIAGNOSIS — Z853 Personal history of malignant neoplasm of breast: Secondary | ICD-10-CM | POA: Insufficient documentation

## 2024-03-25 DIAGNOSIS — Z79899 Other long term (current) drug therapy: Secondary | ICD-10-CM | POA: Diagnosis not present

## 2024-03-25 DIAGNOSIS — H6993 Unspecified Eustachian tube disorder, bilateral: Secondary | ICD-10-CM | POA: Diagnosis not present

## 2024-03-25 DIAGNOSIS — I519 Heart disease, unspecified: Secondary | ICD-10-CM | POA: Diagnosis not present

## 2024-03-25 DIAGNOSIS — Z7902 Long term (current) use of antithrombotics/antiplatelets: Secondary | ICD-10-CM | POA: Insufficient documentation

## 2024-03-25 DIAGNOSIS — I10 Essential (primary) hypertension: Secondary | ICD-10-CM | POA: Insufficient documentation

## 2024-03-25 DIAGNOSIS — R0602 Shortness of breath: Secondary | ICD-10-CM | POA: Diagnosis not present

## 2024-03-25 DIAGNOSIS — Z85828 Personal history of other malignant neoplasm of skin: Secondary | ICD-10-CM | POA: Insufficient documentation

## 2024-03-25 DIAGNOSIS — Z87891 Personal history of nicotine dependence: Secondary | ICD-10-CM | POA: Insufficient documentation

## 2024-03-25 DIAGNOSIS — Z7982 Long term (current) use of aspirin: Secondary | ICD-10-CM | POA: Insufficient documentation

## 2024-03-25 DIAGNOSIS — D75839 Thrombocytosis, unspecified: Secondary | ICD-10-CM | POA: Insufficient documentation

## 2024-03-25 DIAGNOSIS — Z803 Family history of malignant neoplasm of breast: Secondary | ICD-10-CM | POA: Diagnosis not present

## 2024-03-25 LAB — CBC WITH DIFFERENTIAL/PLATELET
Abs Immature Granulocytes: 0.02 K/uL (ref 0.00–0.07)
Basophils Absolute: 0 K/uL (ref 0.0–0.1)
Basophils Relative: 1 %
Eosinophils Absolute: 0.1 K/uL (ref 0.0–0.5)
Eosinophils Relative: 1 %
HCT: 37.3 % (ref 36.0–46.0)
Hemoglobin: 12.4 g/dL (ref 12.0–15.0)
Immature Granulocytes: 0 %
Lymphocytes Relative: 29 %
Lymphs Abs: 1.6 K/uL (ref 0.7–4.0)
MCH: 34.6 pg — ABNORMAL HIGH (ref 26.0–34.0)
MCHC: 33.2 g/dL (ref 30.0–36.0)
MCV: 104.2 fL — ABNORMAL HIGH (ref 80.0–100.0)
Monocytes Absolute: 0.4 K/uL (ref 0.1–1.0)
Monocytes Relative: 6 %
Neutro Abs: 3.6 K/uL (ref 1.7–7.7)
Neutrophils Relative %: 63 %
Platelets: 657 K/uL — ABNORMAL HIGH (ref 150–400)
RBC: 3.58 MIL/uL — ABNORMAL LOW (ref 3.87–5.11)
RDW: 14.3 % (ref 11.5–15.5)
WBC: 5.7 K/uL (ref 4.0–10.5)
nRBC: 0 % (ref 0.0–0.2)

## 2024-03-25 NOTE — Progress Notes (Signed)
 [facility]  [phone] [phone]  ID: Divina Neale OB: 1940/12/09  MR#: [id number]  RDW#:[id number]  Patient Care Team: Rilla Baller, MD as PCP - General (Family Medicine) Hobart Powell BRAVO, MD (Inactive) as PCP - Cardiology (Cardiology) Kassie Acquanetta Bradley, MD as Consulting Physician (Pulmonary Disease) Fate Morna SAILOR, [facility] (Inactive) as Pharmacist (Pharmacist) Jacobo Evalene PARAS, MD as Consulting Physician (Oncology) Portia Fireman, OD (Optometry) Alvia Norleen BIRCH, MD as Consulting Physician (Ophthalmology) Isaiah Scrivener, MD as Consulting Physician (Pulmonary Disease)  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  INTERVAL HISTORY: Patient returns to clinic today for repeat laboratory work and further evaluation.  She currently feels well.  She is tolerating Hydrea  without significant side effects.  She has chronic shortness of breath and requires oxygen.  She does not complain of any weakness or fatigue.  She has no neurologic complaints.  She denies fevers.  She has a good appetite and denies weight loss.  She has no chest pain, cough, or hemoptysis.  She denies any nausea, vomiting, constipation, or diarrhea.  She has no urinary complaints.  Patient offers no further specific complaints today.  REVIEW OF SYSTEMS:   Review of Systems  Constitutional: Negative.  Negative for fever, malaise/fatigue and weight loss.  Respiratory:  Positive for shortness of breath. Negative for cough and hemoptysis.   Cardiovascular: Negative.  Negative for chest pain and leg swelling.  Gastrointestinal: Negative.  Negative for abdominal pain.  Genitourinary: Negative.  Negative for dysuria.  Musculoskeletal: Negative.  Negative for back pain and myalgias.  Skin: Negative.  Negative for rash.  Neurological: Negative.  Negative for dizziness, focal weakness, weakness and headaches.  Psychiatric/Behavioral: Negative.  The patient  is not nervous/anxious.     As per HPI. Otherwise, a complete review of systems is negative.  PAST MEDICAL HISTORY: Past Medical History:  Diagnosis Date   Actinic keratosis    Arthritis    Asthma    Basal cell carcinoma 08/18/2023   right medial pretibial, EDC   Cancer (HCC) 1990   CHF (congestive heart failure) (HCC)    COPD (chronic obstructive pulmonary disease) (HCC)    Coronary artery disease    COVID-19 06/14/2020   Depression    Hx of breast cancer 04/07/2018   1990. No screening in last 25 years   Hypertension     PAST SURGICAL HISTORY: Past Surgical History:  Procedure Laterality Date   BREAST SURGERY Left 1990   CORONARY ARTERY BYPASS GRAFT     JOINT REPLACEMENT Right    Hip   LEFT HEART CATH AND CORS/GRAFTS ANGIOGRAPHY N/A 06/05/2022   Procedure: LEFT HEART CATH AND CORS/GRAFTS ANGIOGRAPHY;  Surgeon: Darron Deatrice LABOR, MD;  Location: [facility] INVASIVE CV LAB;  Service: Cardiovascular;  Laterality: N/A;   OVARIAN CYST REMOVAL  1970   TUBAL LIGATION  1970    FAMILY HISTORY: Family History  Problem Relation Age of Onset   Cancer Father        lung   Heart attack Father 47   Alcohol abuse Father    Arthritis Father    COPD Father    Diabetes Father    Hypertension Father    Breast cancer Sister 26   Arthritis Sister    Cancer Sister    COPD Sister    Drug abuse Sister    Hypertension Sister    Miscarriages / Stillbirths Sister    Thyroid  disease Sister    Arthritis Mother    COPD Mother  Diabetes Mother    Hearing loss Mother    Hypertension Mother    Miscarriages / Stillbirths Mother    Stroke Mother    Alcohol abuse Brother    Arthritis Brother    Hypertension Brother    Arthritis Daughter    Asthma Daughter    Depression Daughter    Hypertension Daughter    Miscarriages / Stillbirths Daughter    Thyroid  disease Daughter    Arthritis Maternal Grandmother    Hypertension Maternal Grandmother    Stroke Maternal Grandmother    Thyroid   disease Maternal Grandmother    Alcohol abuse Maternal Grandfather    Arthritis Maternal Grandfather    Cancer Maternal Grandfather    Depression Maternal Grandfather    Hearing loss Maternal Grandfather    Hypertension Maternal Grandfather    Arthritis Sister    Thyroid  disease Sister    Thyroid  disease Daughter    Hypertension Daughter    Depression Daughter    COPD Daughter    Alcohol abuse Daughter    Arthritis Daughter    Arthritis Daughter    COPD Daughter    Depression Daughter    Hearing loss Daughter    Hypertension Daughter    Thyroid  disease Daughter     ADVANCED DIRECTIVES (Y/N):  N  HEALTH MAINTENANCE: Social History   Tobacco Use   Smoking status: Former    Current packs/day: 0.00    Average packs/day: 1 pack/day for 40.0 years (40.0 ttl pk-yrs)    Types: Cigarettes    Start date: 73    Quit date: 2002    Years since quitting: 23.8   Smokeless tobacco: Never  Vaping Use   Vaping status: Never Used  Substance Use Topics   Alcohol use: Not Currently   Drug use: Never     Colonoscopy:  PAP:  Bone density:  Lipid panel:  Allergies  Allergen Reactions   Advair Diskus [Fluticasone -Salmeterol] Shortness Of Breath    As of 09/01/2014. Pt can take Breo   Bee Pollen Other (See Comments)   Budesonide -Formoterol Fumarate Other (See Comments)    Hard time breathing as of 10/31/2014.   Levofloxacin Other (See Comments)    Nausea, brain fog, depression, as of 12/12/14   Pollen Extract Cough   Qvar [Beclomethasone] Other (See Comments)    Lungs closing-as of 05/03/2015   Singulair [Montelukast] Shortness Of Breath    Current Outpatient Medications  Medication Sig Dispense Refill   acetaminophen  (TYLENOL ) 500 MG tablet Take 1 tablet (500 mg total) by mouth 3 (three) times daily as needed for moderate pain.     albuterol  (VENTOLIN  HFA) 108 (90 Base) MCG/ACT inhaler INHALE 2 PUFFS INTO THE LUNGS EVERY 4 HOURS AS NEEDED FOR WHEEZING OR SHORTNESS OF BREATH.  6.7 each 3   arformoterol  (BROVANA ) 15 MCG/2ML NEBU Take 2 mLs (15 mcg total) by nebulization 2 (two) times daily. 120 mL 12   aspirin  81 MG chewable tablet Chew 1 tablet (81 mg total) by mouth daily.     budesonide  (PULMICORT ) 0.5 MG/2ML nebulizer solution Take 2 mLs (0.5 mg total) by nebulization 2 (two) times daily. 1440 mL 0   carvedilol  (COREG ) 6.25 MG tablet TAKE 1 TABLET BY MOUTH 2 TIMES DAILY WITH A MEAL. 180 tablet 3   Cholecalciferol  (VITAMIN D3) 25 MCG (1000 UT) capsule Take 2 capsules (2,000 Units total) by mouth daily. 30 capsule    clopidogrel  (PLAVIX ) 75 MG tablet Take 1 tablet (75 mg total) by mouth daily with  breakfast. 90 tablet 3   doxycycline  (VIBRA -TABS) 100 MG tablet Take 1 tablet (100 mg total) by mouth 2 (two) times daily. 20 tablet 0   ezetimibe  (ZETIA ) 10 MG tablet TAKE 1 TABLET BY MOUTH EVERY DAY 90 tablet 3   fluticasone  (FLONASE ) 50 MCG/ACT nasal spray Place 2 sprays into both nostrils daily. 16 g 12   hydroxyurea  (HYDREA ) 500 MG capsule Take 3 capsules (1,500 mg total) by mouth daily. May take with food to minimize GI side effects. 270 capsule 2   ipratropium-albuterol  (DUONEB) 0.5-2.5 (3) MG/3ML SOLN Take 3 mLs by nebulization every 4 (four) hours as needed. 360 mL 10   loratadine  (CLARITIN ) 10 MG tablet Take 10 mg by mouth daily.     Multiple Vitamin (MULTIVITAMIN) tablet Take 1 tablet by mouth daily.     Multiple Vitamins-Minerals (PRESERVISION AREDS 2) CAPS Take 2 each by mouth daily at 12 noon.     nitroGLYCERIN  (NITROSTAT ) 0.4 MG SL tablet Place 1 tablet (0.4 mg total) under the tongue every 5 (five) minutes as needed for chest pain. 25 tablet 3   roflumilast  (DALIRESP ) 500 MCG TABS tablet TAKE 1 TABLET BY MOUTH DAILY 90 tablet 3   rosuvastatin  (CRESTOR ) 20 MG tablet Take 1 tablet (20 mg total) by mouth at bedtime. 90 tablet 3   sacubitril -valsartan  (ENTRESTO ) 24-26 MG Take 2 tablets by mouth 2 (two) times daily.     sertraline  (ZOLOFT ) 100 MG tablet Take 1  tablet (100 mg total) by mouth daily. 90 tablet 4   No current facility-administered medications for this visit.    OBJECTIVE: Vitals:   03/25/24 1105  BP: (!) 154/79  Pulse: 62  Resp: 18  Temp: 98.6 F (37 C)  SpO2: 97%     Body mass index is 27.61 kg/m.    ECOG FS:1 - Symptomatic but completely ambulatory  General: Well-developed, well-nourished, no acute distress. Eyes: Pink conjunctiva, anicteric sclera. HEENT: Normocephalic, moist mucous membranes. Lungs: No audible wheezing or coughing. Heart: Regular rate and rhythm. Abdomen: Soft, nontender, no obvious distention. Musculoskeletal: No edema, cyanosis, or clubbing. Neuro: Alert, answering all questions appropriately. Cranial nerves grossly intact. Skin: No rashes or petechiae noted. Psych: Normal affect.  LAB RESULTS:  Lab Results  Component Value Date   NA 138 12/29/2023   K 3.4 (L) 12/29/2023   CL 104 12/29/2023   CO2 26 12/29/2023   GLUCOSE 125 (H) 12/29/2023   BUN 18 12/29/2023   CREATININE 0.53 12/29/2023   CALCIUM  9.8 12/29/2023   PROT 6.5 08/27/2023   ALBUMIN 3.3 (L) 08/27/2023   AST 18 08/27/2023   ALT 15 08/27/2023   ALKPHOS 79 08/27/2023   BILITOT 0.4 08/27/2023   GFRNONAA >60 12/29/2023   GFRAA >60 01/07/2020    Lab Results  Component Value Date   WBC 5.7 03/25/2024   NEUTROABS 3.6 03/25/2024   HGB 12.4 03/25/2024   HCT 37.3 03/25/2024   MCV 104.2 (H) 03/25/2024   PLT 657 (H) 03/25/2024     STUDIES: No results found.   ASSESSMENT: Essential thrombocytosis with CALR mutation.  PLAN:    Essential thrombocytosis with CALR mutation: Patient's platelet count remains elevated, but improved to 657.  Continue 1500 mg Hydrea  at this time.  We previously discussed possibly switching to anagrelide or increasing her dose of Hydrea , but this is not necessary at this point.  No further intervention is needed.   Continue 81 mg aspirin  daily.  Patient has requested less frequent follow-up,  therefore will  return to clinic in 6 months with repeat laboratory work and further evaluation.   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  Monitor. Cardiac disease: Being medically managed.  Patient currently on Plavix  and aspirin .  Follow-up with her cardiologist as indicated. Hypertension: Blood pressure mildly elevated today.  Continue monitoring treatment per primary care. Shortness of breath: Chronic and unchanged.  Continue oxygen as prescribed.   Patient expressed understanding and was in agreement with this plan. She also understands that She can call clinic at any time with any questions, concerns, or complaints.    Evalene JINNY Reusing, MD   03/25/2024 2:25 PM

## 2024-03-30 ENCOUNTER — Telehealth: Payer: Self-pay

## 2024-03-30 NOTE — Telephone Encounter (Signed)
 Gave pt a call pt is coming due for re-enrollment on [facility]ir[facility]  spoke with pt daughter, she wants pap to be mail out.

## 2024-04-01 DIAGNOSIS — H90A32 Mixed conductive and sensorineural hearing loss, unilateral, left ear with restricted hearing on the contralateral side: Secondary | ICD-10-CM | POA: Diagnosis not present

## 2024-04-01 DIAGNOSIS — H6993 Unspecified Eustachian tube disorder, bilateral: Secondary | ICD-10-CM | POA: Diagnosis not present

## 2024-04-05 ENCOUNTER — Encounter (INDEPENDENT_AMBULATORY_CARE_PROVIDER_SITE_OTHER): Payer: PPO | Admitting: Ophthalmology

## 2024-04-05 DIAGNOSIS — H43813 Vitreous degeneration, bilateral: Secondary | ICD-10-CM

## 2024-04-05 DIAGNOSIS — I10 Essential (primary) hypertension: Secondary | ICD-10-CM

## 2024-04-05 DIAGNOSIS — H35033 Hypertensive retinopathy, bilateral: Secondary | ICD-10-CM | POA: Diagnosis not present

## 2024-04-05 DIAGNOSIS — D3132 Benign neoplasm of left choroid: Secondary | ICD-10-CM

## 2024-04-05 DIAGNOSIS — H353132 Nonexudative age-related macular degeneration, bilateral, intermediate dry stage: Secondary | ICD-10-CM | POA: Diagnosis not present

## 2024-04-08 DIAGNOSIS — J9611 Chronic respiratory failure with hypoxia: Secondary | ICD-10-CM | POA: Diagnosis not present

## 2024-04-19 ENCOUNTER — Emergency Department

## 2024-04-19 ENCOUNTER — Other Ambulatory Visit: Payer: Self-pay

## 2024-04-19 ENCOUNTER — Inpatient Hospital Stay
Admission: EM | Admit: 2024-04-19 | Discharge: 2024-04-23 | DRG: 199 | Disposition: A | Attending: Internal Medicine | Admitting: Internal Medicine

## 2024-04-19 DIAGNOSIS — J9811 Atelectasis: Secondary | ICD-10-CM | POA: Diagnosis not present

## 2024-04-19 DIAGNOSIS — Z950 Presence of cardiac pacemaker: Secondary | ICD-10-CM | POA: Diagnosis not present

## 2024-04-19 DIAGNOSIS — I517 Cardiomegaly: Secondary | ICD-10-CM | POA: Diagnosis not present

## 2024-04-19 DIAGNOSIS — R918 Other nonspecific abnormal finding of lung field: Secondary | ICD-10-CM | POA: Diagnosis not present

## 2024-04-19 DIAGNOSIS — S270XXA Traumatic pneumothorax, initial encounter: Principal | ICD-10-CM

## 2024-04-19 DIAGNOSIS — J9621 Acute and chronic respiratory failure with hypoxia: Secondary | ICD-10-CM

## 2024-04-19 DIAGNOSIS — J939 Pneumothorax, unspecified: Secondary | ICD-10-CM | POA: Diagnosis present

## 2024-04-19 DIAGNOSIS — Z4682 Encounter for fitting and adjustment of non-vascular catheter: Secondary | ICD-10-CM | POA: Diagnosis not present

## 2024-04-19 DIAGNOSIS — S2241XA Multiple fractures of ribs, right side, initial encounter for closed fracture: Secondary | ICD-10-CM | POA: Diagnosis not present

## 2024-04-19 DIAGNOSIS — J479 Bronchiectasis, uncomplicated: Secondary | ICD-10-CM | POA: Diagnosis not present

## 2024-04-19 DIAGNOSIS — J441 Chronic obstructive pulmonary disease with (acute) exacerbation: Secondary | ICD-10-CM | POA: Diagnosis present

## 2024-04-19 DIAGNOSIS — I700 Atherosclerosis of aorta: Secondary | ICD-10-CM | POA: Diagnosis not present

## 2024-04-19 LAB — BASIC METABOLIC PANEL WITH GFR
Anion gap: 9 (ref 5–15)
BUN: 12 mg/dL (ref 8–23)
CO2: 26 mmol/L (ref 22–32)
Calcium: 10.6 mg/dL — ABNORMAL HIGH (ref 8.9–10.3)
Chloride: 106 mmol/L (ref 98–111)
Creatinine, Ser: 0.63 mg/dL (ref 0.44–1.00)
GFR, Estimated: >60 mL/min (ref >60)
Glucose, Bld: 134 mg/dL — ABNORMAL HIGH (ref 70–99)
Potassium: 3.9 mmol/L (ref 3.5–5.1)
Sodium: 141 mmol/L (ref 135–145)

## 2024-04-19 LAB — CBC
HCT: 37 % (ref 36.0–46.0)
Hemoglobin: 12.2 g/dL (ref 12.0–15.0)
MCH: 34.9 pg — ABNORMAL HIGH (ref 26.0–34.0)
MCHC: 33 g/dL (ref 30.0–36.0)
MCV: 105.7 fL — ABNORMAL HIGH (ref 80.0–100.0)
Platelets: 500 K/uL — ABNORMAL HIGH (ref 150–400)
RBC: 3.5 MIL/uL — ABNORMAL LOW (ref 3.87–5.11)
RDW: 14.7 % (ref 11.5–15.5)
WBC: 6.4 K/uL (ref 4.0–10.5)
nRBC: 0 % (ref 0.0–0.2)

## 2024-04-19 LAB — CBG MONITORING, ED: Glucose-Capillary: 152 mg/dL — ABNORMAL HIGH (ref 70–99)

## 2024-04-19 MED ORDER — ACETAMINOPHEN 650 MG RE SUPP: 650.0000 mg | Freq: Four times a day (QID) | RECTAL | Status: DC | PRN

## 2024-04-19 MED ORDER — ONDANSETRON HCL 4 MG PO TABS: 4.0000 mg | ORAL_TABLET | Freq: Four times a day (QID) | ORAL | Status: DC | PRN

## 2024-04-19 MED ORDER — CARVEDILOL 6.25 MG PO TABS
6.2500 mg | ORAL_TABLET | Freq: Two times a day (BID) | ORAL | Status: DC
  Administered 2024-04-19 – 2024-04-23 (×9): 6.25 mg via ORAL
  Filled 2024-04-19 (×9): qty 1

## 2024-04-19 MED ORDER — MIDAZOLAM HCL (PF) 2 MG/2ML IJ SOLN
1.0000 mg | Freq: Once | INTRAMUSCULAR | Status: AC
  Administered 2024-04-19: 1 mg via INTRAVENOUS
  Filled 2024-04-19: qty 2

## 2024-04-19 MED ORDER — LIDOCAINE HCL (PF) 1 % IJ SOLN
30.0000 mL | Freq: Once | INTRAMUSCULAR | Status: AC
  Administered 2024-04-19: 5 mL
  Filled 2024-04-19: qty 30

## 2024-04-19 MED ORDER — ACETAMINOPHEN 325 MG PO TABS: 650.0000 mg | ORAL_TABLET | Freq: Four times a day (QID) | ORAL | Status: DC | PRN

## 2024-04-19 MED ORDER — ONDANSETRON HCL 4 MG/2ML IJ SOLN: 4.0000 mg | Freq: Four times a day (QID) | INTRAMUSCULAR | Status: DC | PRN

## 2024-04-19 MED ORDER — BUDESONIDE 0.5 MG/2ML IN SUSP
0.5000 mg | Freq: Two times a day (BID) | RESPIRATORY_TRACT | Status: DC
  Administered 2024-04-19 – 2024-04-23 (×8): 0.5 mg via RESPIRATORY_TRACT
  Filled 2024-04-19 (×8): qty 2

## 2024-04-19 MED ORDER — MORPHINE SULFATE (PF) 2 MG/ML IV SOLN
2.0000 mg | INTRAVENOUS | Status: DC | PRN
  Administered 2024-04-20 – 2024-04-23 (×7): 2 mg via INTRAVENOUS
  Filled 2024-04-19 (×7): qty 1

## 2024-04-19 MED ORDER — OXYCODONE-ACETAMINOPHEN 5-325 MG PO TABS
1.0000 | ORAL_TABLET | Freq: Once | ORAL | Status: AC
  Administered 2024-04-19: 1 via ORAL
  Filled 2024-04-19: qty 1

## 2024-04-19 MED ORDER — SENNOSIDES-DOCUSATE SODIUM 8.6-50 MG PO TABS: 1.0000 | ORAL_TABLET | Freq: Every evening | ORAL | Status: DC | PRN

## 2024-04-19 MED ORDER — HYDROXYUREA 500 MG PO CAPS
1500.0000 mg | ORAL_CAPSULE | Freq: Every day | ORAL | Status: DC
  Administered 2024-04-20 – 2024-04-23 (×4): 1500 mg via ORAL
  Filled 2024-04-19 (×4): qty 3

## 2024-04-19 MED ORDER — OXYCODONE HCL 5 MG PO TABS
5.0000 mg | ORAL_TABLET | ORAL | Status: DC | PRN
  Administered 2024-04-19 – 2024-04-23 (×10): 5 mg via ORAL
  Filled 2024-04-19 (×10): qty 1

## 2024-04-19 MED ORDER — ARFORMOTEROL TARTRATE 15 MCG/2ML IN NEBU
15.0000 ug | INHALATION_SOLUTION | Freq: Two times a day (BID) | RESPIRATORY_TRACT | Status: DC
  Administered 2024-04-19 – 2024-04-23 (×6): 15 ug via RESPIRATORY_TRACT
  Filled 2024-04-19 (×10): qty 2

## 2024-04-19 MED ORDER — IPRATROPIUM-ALBUTEROL 0.5-2.5 (3) MG/3ML IN SOLN
3.0000 mL | RESPIRATORY_TRACT | Status: DC | PRN
  Administered 2024-04-20: 3 mL via RESPIRATORY_TRACT
  Filled 2024-04-19: qty 3

## 2024-04-19 NOTE — ED Provider Notes (Addendum)
 Care assumed of patient from outgoing provider.  See their note for initial history, exam and plan.  Patient presented to the following a fall.  Found to have right sided rib fractures with pneumothorax.  Plan for admission after chest tube placement.  Patient consented for right sided pigtail chest tube.  Plan to admit for multiple rib fractures and pneumothorax.  Chest tube in place and repeat chest x-ray with improvement in pneumothorax.  Patient states that she is doing well.  Consulted hospitalist for admission  CHEST TUBE INSERTION  Date/Time: 04/19/2024 1:41 PM  Performed by: Suzanne Kirsch, MD Authorized by: Suzanne Kirsch, MD   Consent:    Consent obtained:  Verbal and written   Consent given by:  Parent   Risks discussed:  Bleeding, incomplete drainage, nerve damage, pain, infection and damage to surrounding structures   Alternatives discussed:  No treatment and delayed treatment Universal protocol:    Procedure explained and questions answered to patient or proxy's satisfaction: yes     Relevant documents present and verified: yes     Test results available: yes     Imaging studies available: yes     Required blood products, implants, devices, and special equipment available: yes     Site/side marked: yes     Immediately prior to procedure, a time out was called: yes     Patient identity confirmed:  Verbally with patient, hospital-assigned identification number and arm band Pre-procedure details:    Skin preparation:  Chlorhexidine Sedation:    Sedation type:  Anxiolysis Anesthesia:    Anesthesia method:  Local infiltration   Local anesthetic:  Lidocaine  1% w/o epi Procedure details:    Placement location:  R lateral   Scalpel size:  11   Tube size (French): 14.   Tension pneumothorax: no     Tube connected to:  Suction   Drainage characteristics:  Air only   Suture material:  2-0 silk   Dressing:  4x4 sterile gauze Post-procedure details:    Post-insertion x-ray  findings: tube in good position     Procedure completion:  [place] Suzanne Kirsch, MD 04/19/24 1342    Suzanne Kirsch, MD 04/19/24 1401

## 2024-04-19 NOTE — ED Notes (Signed)
Pharmacy at bedside

## 2024-04-19 NOTE — ED Provider Notes (Signed)
 [facility] Provider Note    Event Date/Time   First MD Initiated Contact with Patient 04/19/24 1026     (approximate)   History   Fall   HPI  Nancy Merritt is a 83 y.o. female who presents after a fall.  Patient reports she fell last night, struck her right lateral chest/back against the nightstand.  Complains of pain in that area, no other injuries reported     Physical Exam   Triage Vital Signs: ED Triage Vitals  Encounter Vitals Group     BP 04/19/24 1013 (!) 156/84     Girls Systolic BP Percentile --      Girls Diastolic BP Percentile --      Boys Systolic BP Percentile --      Boys Diastolic BP Percentile --      Pulse Rate 04/19/24 1013 76     Resp 04/19/24 1013 20     Temp 04/19/24 1013 97.9 F (36.6 C)     Temp src --      SpO2 04/19/24 1013 98 %     Weight 04/19/24 1014 62 kg (136 lb 11 oz)     Height --      Head Circumference --      Peak Flow --      Pain Score 04/19/24 1014 6     Pain Loc --      Pain Education --      Exclude from Growth Chart --     Most recent vital signs: Vitals:   04/19/24 1013  BP: (!) 156/84  Pulse: 76  Resp: 20  Temp: 97.9 F (36.6 C)  SpO2: 98%     General: Awake, no distress.  CV:  Good peripheral perfusion.  Tenderness to the right lateral and posterior chest at approximately T4 T3, no bony abnormalities palpated Resp:  Normal effort.  Abd:  No distention.  Other:     ED Results / Procedures / Treatments   Labs (all labs ordered are listed, but only abnormal results are displayed) Labs Reviewed  CBC - Abnormal; Notable for the following components:      Result Value   RBC 3.50 (*)    MCV 105.7 (*)    MCH 34.9 (*)    Platelets 500 (*)    All other components within normal limits  BASIC METABOLIC PANEL WITH GFR     EKG     RADIOLOGY CT chest    PROCEDURES:  Critical Care performed: yes  CRITICAL CARE Performed by: Lamar Price   Total critical care  time: 30 minutes  Critical care time was exclusive of separately billable procedures and treating other patients.  Critical care was necessary to treat or prevent imminent or life-threatening deterioration.  Critical care was time spent personally by me on the following activities: development of treatment plan with patient and/or surrogate as well as nursing, discussions with consultants, evaluation of patient's response to treatment, examination of patient, obtaining history from patient or surrogate, ordering and performing treatments and interventions, ordering and review of laboratory studies, ordering and review of radiographic studies, pulse oximetry and re-evaluation of patient's condition.   Procedures   MEDICATIONS ORDERED IN ED: Medications  oxyCODONE-acetaminophen  (PERCOCET/ROXICET) 5-325 MG per tablet 1 tablet (1 tablet Oral Given 04/19/24 1044)     IMPRESSION / MDM / ASSESSMENT AND PLAN / ED COURSE  I reviewed the triage vital signs and the nursing notes. Patient's presentation is most consistent with acute  presentation with potential threat to life or bodily function.  Patient presents after a fall as described above, she is on baseline 2 L nasal cannula, differential includes rib fracture, pneumothorax, chest wall contusion.  Will treat with oxycodone, send for CT chest  Contacted by radiologist and notified of rib fractures and moderate right sided pneumothorax  Patient will require chest tube insertion, my colleague Dr. Suzanne will place chest tube.     FINAL CLINICAL IMPRESSION(S) / ED DIAGNOSES   Final diagnoses:  Traumatic pneumothorax, initial encounter     Rx / DC Orders   ED Discharge Orders     None        Note:  This document was prepared using Dragon voice recognition software and may include unintentional dictation errors.   Arlander Charleston, MD 04/19/24 [phone]

## 2024-04-19 NOTE — ED Triage Notes (Signed)
 C/O fall several days ago. Pt has a step to get up into bed and mis stepped and fell hitting night stand.with right mid back. C/O back pain and also pain with inspiration and now feeling more SOB.  Wears home oxygen  2l/ [place]  AAOx3. Skin warm and dry. No SOB/ DOE. NAD

## 2024-04-19 NOTE — ED Notes (Signed)
 This RN helped patient reposition in the bed; patient appears to be in a lot of pain with any  movement.  Reports 7/10; dose of Morphine  offered at this time, patient states, I'd rather not, lets do with as little medication as possible.  Patient does agree to another Oxycodone at next due time.

## 2024-04-19 NOTE — ED Notes (Signed)
 Patient provided dinner tray

## 2024-04-19 NOTE — ED Notes (Signed)
 CCMD notifed of cardiac monitoring.

## 2024-04-19 NOTE — ED Notes (Signed)
 Hospitalist at bedside

## 2024-04-19 NOTE — H&P (Addendum)
 History and Physical    Patient: Nancy Merritt FMW:[id number] DOB: 18-Jan-1941 DOA: 04/19/2024 DOS: the patient was seen and examined on 04/19/2024 PCP: Rilla Baller, MD  Patient coming from: Home  Chief Complaint:  Chief Complaint  Patient presents with   Fall   HPI: Nancy Merritt is a 83 y.o. 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  Patient says she missed stepped in her bedroom, fell onto her right side onto the nightstand.  She did not lose consciousness or sustain any additional injuries.  Prior to the fall she was at her baseline health. She lives at home with family. Denies frequent falls.    In the emergency department, she was hemodynamically stable, saturating appropriately on her baseline O2.  CT scan of the chest revealed right sided rib fractures with a moderate right hydropneumothorax.  Chest tube was inserted by the ED provider. Hospitalist consulted for admission.  Review of Systems: Review of Systems  Constitutional:  Negative for chills, fever and weight loss.  Respiratory:  Positive for shortness of breath. Negative for cough and wheezing.   Cardiovascular:  Negative for chest pain, palpitations and orthopnea.  Gastrointestinal:  Negative for abdominal pain, blood in stool, constipation, diarrhea, nausea and vomiting.  Genitourinary:  Negative for dysuria and frequency.  Musculoskeletal:  Positive for back pain and falls. Negative for joint pain.  Neurological:  Negative for dizziness, tremors, focal weakness, weakness and headaches.    Past Medical History:  Diagnosis Date   Actinic keratosis    Arthritis    Asthma    Basal cell carcinoma 08/18/2023   right medial pretibial, [facility]   Cancer (HCC) 1990   CHF (congestive heart failure) (HCC)    COPD  (chronic obstructive pulmonary disease) (HCC)    Coronary artery disease    COVID-19 06/14/2020   Depression    Hx of breast cancer 04/07/2018   1990. No screening in last 25 years   Hypertension    Past Surgical History:  Procedure Laterality Date   BREAST SURGERY Left 1990   CORONARY ARTERY BYPASS GRAFT     JOINT REPLACEMENT Right    Hip   LEFT HEART CATH AND CORS/GRAFTS ANGIOGRAPHY N/A 06/05/2022   Procedure: LEFT HEART CATH AND CORS/GRAFTS ANGIOGRAPHY;  Surgeon: Darron Deatrice LABOR, MD;  Location: [facility];  Service: Cardiovascular;  Laterality: N/A;   OVARIAN CYST REMOVAL  1970   TUBAL LIGATION  1970   Social History:  reports that she quit smoking about 23 years ago. Her smoking use included cigarettes. She started smoking about 63 years ago. She has a 40 pack-year smoking history. She has never used smokeless tobacco. She reports that she does not currently use alcohol. She reports that she does not use drugs.  Allergies  Allergen Reactions   Advair Diskus [Fluticasone -Salmeterol] Shortness Of Breath    As of 09/01/2014. Pt can take Breo   Bee Pollen Other (See Comments)   Budesonide -Formoterol Fumarate Other (See Comments)    Hard time breathing as of 10/31/2014.   Levofloxacin Other (See Comments)    Nausea, brain fog, depression, as of 12/12/14   Pollen Extract Cough   Qvar [Beclomethasone] Other (See Comments)    Lungs closing-as of 05/03/2015   Singulair [Montelukast] Shortness Of Breath    Family History  Problem Relation Age of  Onset   Cancer Father        lung   Heart attack Father 57   Alcohol abuse Father    Arthritis Father    COPD Father    Diabetes Father    Hypertension Father    Breast cancer Sister 44   Arthritis Sister    Cancer Sister    COPD Sister    Drug abuse Sister    Hypertension Sister    Miscarriages / Stillbirths Sister    Thyroid  disease Sister    Arthritis Mother    COPD Mother    Diabetes Mother    Hearing loss Mother     Hypertension Mother    Miscarriages / Stillbirths Mother    Stroke Mother    Alcohol abuse Brother    Arthritis Brother    Hypertension Brother    Arthritis Daughter    Asthma Daughter    Depression Daughter    Hypertension Daughter    Miscarriages / Stillbirths Daughter    Thyroid  disease Daughter    Arthritis Maternal Grandmother    Hypertension Maternal Grandmother    Stroke Maternal Grandmother    Thyroid  disease Maternal Grandmother    Alcohol abuse Maternal Grandfather    Arthritis Maternal Grandfather    Cancer Maternal Grandfather    Depression Maternal Grandfather    Hearing loss Maternal Grandfather    Hypertension Maternal Grandfather    Arthritis Sister    Thyroid  disease Sister    Thyroid  disease Daughter    Hypertension Daughter    Depression Daughter    COPD Daughter    Alcohol abuse Daughter    Arthritis Daughter    Arthritis Daughter    COPD Daughter    Depression Daughter    Hearing loss Daughter    Hypertension Daughter    Thyroid  disease Daughter     Prior to Admission medications   Medication Sig Start Date End Date Taking? Authorizing Provider  acetaminophen  (TYLENOL ) 500 MG tablet Take 1 tablet (500 mg total) by mouth 3 (three) times daily as needed for moderate pain. 06/11/22   Rilla Baller, MD  albuterol  (VENTOLIN  HFA) 108 (90 Base) MCG/ACT inhaler INHALE 2 PUFFS INTO THE LUNGS EVERY 4 HOURS AS NEEDED FOR WHEEZING OR SHORTNESS OF BREATH. 11/04/23   Rilla Baller, MD  arformoterol  (BROVANA ) 15 MCG/2ML NEBU Take 2 mLs (15 mcg total) by nebulization 2 (two) times daily. 10/15/23   Kasa, Kurian, MD  aspirin  81 MG chewable tablet Chew 1 tablet (81 mg total) by mouth daily. 06/07/22   Meng, Hao, PA  budesonide  (PULMICORT ) 0.5 MG/2ML nebulizer solution Take 2 mLs (0.5 mg total) by nebulization 2 (two) times daily. 10/15/23 10/14/24  Kasa, Kurian, MD  carvedilol  (COREG ) 6.25 MG tablet TAKE 1 TABLET BY MOUTH 2 TIMES DAILY WITH A MEAL. 09/29/23   Meng,  Hao, PA  Cholecalciferol  (VITAMIN D3) 25 MCG (1000 UT) capsule Take 2 capsules (2,000 Units total) by mouth daily. 08/15/22   Rilla Baller, MD  clopidogrel  (PLAVIX ) 75 MG tablet Take 1 tablet (75 mg total) by mouth daily with breakfast. 10/17/23   Okey Vina GAILS, MD  doxycycline  (VIBRA -TABS) 100 MG tablet Take 1 tablet (100 mg total) by mouth 2 (two) times daily. 12/10/23   Tamea Dedra CROME, MD  ezetimibe  (ZETIA ) 10 MG tablet TAKE 1 TABLET BY MOUTH EVERY DAY 06/17/23   Meng, Hao, PA  fluticasone  (FLONASE ) 50 MCG/ACT nasal spray Place 2 sprays into both nostrils daily. 08/15/23   Rilla Baller,  MD  hydroxyurea  (HYDREA ) 500 MG capsule Take 3 capsules (1,500 mg total) by mouth daily. May take with food to minimize GI side effects. 06/19/23   Finnegan, Timothy J, MD  ipratropium-albuterol  (DUONEB) 0.5-2.5 (3) MG/3ML SOLN Take 3 mLs by nebulization every 4 (four) hours as needed. 03/04/24   Kasa, Kurian, MD  loratadine  (CLARITIN ) 10 MG tablet Take 10 mg by mouth daily.    [provider]  Multiple Vitamin (MULTIVITAMIN) tablet Take 1 tablet by mouth daily.    [provider]  Multiple Vitamins-Minerals (PRESERVISION AREDS 2) CAPS Take 2 each by mouth daily at 12 noon.    [provider]  nitroGLYCERIN  (NITROSTAT ) 0.4 MG SL tablet Place 1 tablet (0.4 mg total) under the tongue every 5 (five) minutes as needed for chest pain. 06/06/22   Meng, Hao, PA  roflumilast  (DALIRESP ) 500 MCG TABS tablet TAKE 1 TABLET BY MOUTH DAILY 02/05/24   Isaiah Scrivener, MD  rosuvastatin  (CRESTOR ) 20 MG tablet Take 1 tablet (20 mg total) by mouth at bedtime. 06/06/22   Meng, Hao, PA  sacubitril -valsartan  (ENTRESTO ) 24-26 MG Take 2 tablets by mouth 2 (two) times daily. 06/24/23   Okey Vina GAILS, MD  sertraline  (ZOLOFT ) 100 MG tablet Take 1 tablet (100 mg total) by mouth daily. 08/15/23   Rilla Baller, MD    Physical Exam: Vitals:   04/19/24 1325 04/19/24 1330 04/19/24 1345 04/19/24 1400  BP: 127/88  (!) 168/99 (!) 142/67 136/69  Pulse: 75 64 62 63  Resp: (!) 24 18 (!) 24 20  Temp:      SpO2: 100% 100%  100%  Weight:      Physical Exam Vitals and nursing note reviewed.  Constitutional:      General: She is not in acute distress.    Appearance: She is normal weight. She is not ill-appearing or toxic-appearing.  HENT:     Head: Normocephalic and atraumatic.     Mouth/Throat:     Mouth: Mucous membranes are moist.     Pharynx: Oropharynx is clear.  Eyes:     Extraocular Movements: Extraocular movements intact.     Pupils: Pupils are equal, round, and reactive to light.  Cardiovascular:     Rate and Rhythm: Normal rate and regular rhythm.  Pulmonary:     Effort: Pulmonary effort is normal. No respiratory distress.     Breath sounds: Rhonchi present.     Comments: Pigtail catheter in lower right ribs, dressing CDI  Chest:     Chest wall: Tenderness present.  Abdominal:     General: Bowel sounds are normal. There is no distension.     Tenderness: There is no abdominal tenderness. There is no guarding.  Musculoskeletal:     Cervical back: Normal range of motion and neck supple.     Right lower leg: No edema.     Left lower leg: No edema.  Skin:    General: Skin is warm and dry.  Neurological:     General: No focal deficit present.     Mental Status: She is alert and oriented to person, place, and time. Mental status is at baseline.     Cranial Nerves: No cranial nerve deficit.     Motor: No weakness.  Psychiatric:        Mood and Affect: Mood normal.        Behavior: Behavior normal.     Data Reviewed:   Labs on Admission: I have personally reviewed following labs and imaging  studies  CBC: Recent Labs  Lab 04/19/24 1205  WBC 6.4  HGB 12.2  HCT 37.0  MCV 105.7*  PLT 500*   Basic Metabolic Panel: Recent Labs  Lab 04/19/24 1205  NA 141  K 3.9  CL 106  CO2 26  GLUCOSE 134*  BUN 12  CREATININE 0.63  CALCIUM  10.6*   GFR: Estimated Creatinine  Clearance: 41.1 mL/min (by C-G formula based on SCr of 0.63 mg/dL). Liver Function Tests: No results for input(s): AST, ALT, ALKPHOS, BILITOT, PROT, ALBUMIN in the last 168 hours. No results for input(s): LIPASE, AMYLASE in the last 168 hours. No results for input(s): AMMONIA in the last 168 hours. Coagulation Profile: No results for input(s): INR, PROTIME in the last 168 hours. Cardiac Enzymes: No results for input(s): CKTOTAL, CKMB, CKMBINDEX, TROPONINI in the last 168 hours. BNP (last 3 results) No results for input(s): PROBNP in the last 8760 hours. HbA1C: No results for input(s): HGBA1C in the last 72 hours. CBG: No results for input(s): GLUCAP in the last 168 hours. Lipid Profile: No results for input(s): CHOL, HDL, LDLCALC, TRIG, CHOLHDL, LDLDIRECT in the last 72 hours. Thyroid  Function Tests: No results for input(s): TSH, T4TOTAL, FREET4, T3FREE, THYROIDAB in the last 72 hours. Anemia Panel: No results for input(s): VITAMINB12, FOLATE, FERRITIN, TIBC, IRON, RETICCTPCT in the last 72 hours. Urine analysis:    Component Value Date/Time   COLORURINE YELLOW 05/26/2023 1817   APPEARANCEUR HAZY (A) 05/26/2023 1817   LABSPEC 1.023 05/26/2023 1817   PHURINE 5.0 05/26/2023 1817   GLUCOSEU NEGATIVE 05/26/2023 1817   HGBUR NEGATIVE 05/26/2023 1817   BILIRUBINUR NEGATIVE 05/26/2023 1817   BILIRUBINUR negative 05/26/2023 1418   BILIRUBINUR Negative 06/14/2020 1457   KETONESUR NEGATIVE 05/26/2023 1817   KETONESUR negative 05/26/2023 1418   PROTEINUR NEGATIVE 05/26/2023 1817   PROTEINUR trace (A) 05/26/2023 1418   UROBILINOGEN 0.2 05/26/2023 1418   NITRITE NEGATIVE 05/26/2023 1817   NITRITE Negative 05/26/2023 1418   LEUKOCYTESUR NEGATIVE 05/26/2023 1817   LEUKOCYTESUR Negative 05/26/2023 1418    Radiological Exams on Admission: DG Chest Portable 1 View Result Date: 04/19/2024 EXAM: 1 VIEW XRAY OF THE CHEST  04/19/2024 01:54:00 PM COMPARISON: Chest radiograph of 12/27/23, compared to chest CT of earlier today. CLINICAL HISTORY: S/P chest tube FINDINGS: LINES, TUBES AND DEVICES: Right sided pleural pigtail catheter placement. LUNGS AND PLEURA: Bibasilar scarring. Suspected residual less than 5 percent right apical pneumothorax. HEART AND MEDIASTINUM: Pacer/defibrillator projects over the left hemithorax. BONES AND SOFT TISSUES: Fracture of the 3rd sternal wire, median sternotomy. IMPRESSION: 1. Right-sided pleural pigtail catheter placement with suspicion of a less than 5% residual right apical pneumothorax. Electronically signed by: Rockey Kilts MD 04/19/2024 02:41 PM EST RP Workstation: [id number]   CT Chest Wo Contrast Result Date: 04/19/2024 CLINICAL DATA:  Chest trauma, fall, pain. EXAM: CT CHEST WITHOUT CONTRAST TECHNIQUE: Multidetector CT imaging of the chest was performed following the standard protocol without IV contrast. RADIATION DOSE REDUCTION: This exam was performed according to the departmental dose-optimization program which includes automated exposure control, adjustment of the mA and/or kV according to patient size and/or use of iterative reconstruction technique. COMPARISON:  05/26/2023. FINDINGS: Cardiovascular: Atherosclerotic calcification of the aorta, aortic valve and coronary arteries. Enlarged pulmonic trunk and heart. No pericardial effusion. Mediastinum/Nodes: No pathologically enlarged mediastinal or axillary lymph nodes. Hilar regions are difficult to definitively evaluate without IV contrast. 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 Air in the esophagus can be seen with dysmotility.  Lungs/Pleura: Moderate right pneumothorax with a small right pleural effusion. Centrilobular emphysema. Focal scarring and consolidation in the medial segment right middle lobe. Dependent atelectasis in the right lower lobe. Postinfectious peribronchovascular nodularity in the left upper and left  lower lobes. Scattered mucoid impaction and peribronchial thickening. No pleural fluid. Debris in the airway. Upper Abdomen: Small hiatal hernia. Fluid density mass in the left upper quadrant measures 2.9 x 5.6 cm, as on 05/26/2023 favoring a benign etiology such as a lymphangioma. Visualized portions of the liver, gallbladder, adrenal glands, kidneys, spleen, pancreas, stomach and bowel are otherwise grossly unremarkable. No upper abdominal adenopathy. Musculoskeletal: Degenerative changes in the spine. Osteopenia. There are fractures of the right sixth and seventh posterolateral ribs. IMPRESSION: 1. Right sixth and seventh posterior rib fractures with a moderate right hydropneumothorax. Critical Value/emergent results were called by telephone at the time of interpretation on 04/19/2024 at 11:53 am to provider LAMAR PRICE , who verbally acknowledged these results. 2. Probable scarring and compressive atelectasis in the medial segment right middle lobe. Consider follow-up CT chest without contrast in 4-6 weeks in further evaluation, as malignancy cannot be excluded. 3. Aortic atherosclerosis (ICD10-I70.0). Coronary artery calcification. 4. Enlarged pulmonic trunk, indicative of pulmonary arterial hypertension. 5.  Emphysema (ICD10-J43.9). Electronically Signed   By: Newell Eke M.D.   On: 04/19/2024 11:53       Assessment and Plan: 83 y.o. 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  Chest tube placed in the emergency department.   Traumatic right sided rib fractures  Traumatic Pneumothorax, right  - Chest tube placed in the ED with less than 5% residual right ptx  - continue supplemental O2 - incentive spirometer - pain control   - hold asa and plavix  for now  - Repeat CXR in the morning, consult Pulmonology in  the am  - PT evaluation   COPD  - stable, on 2L [place] with activity at home  - continue budesonide /formoterol, PRN duonebs   CAD  HTN - hold asa and plavix  for now - resume GDMT when reconciled   Essential thrombocytosis - Continue hydroxyurea   SCD's  Regular diet  No ivf  Monitor/replace electrolytes   Advance Care Planning:   Code Status: Prior discussed with patient at time of admission     Severity of Illness: The appropriate patient status for this patient is INPATIENT. Inpatient status is judged to be reasonable and necessary in order to provide the required intensity of service to ensure the patient's safety. The patient's presenting symptoms, physical exam findings, and initial radiographic and laboratory data in the context of their chronic comorbidities is felt to place them at high risk for further clinical deterioration. Furthermore, it is not anticipated that the patient will be medically stable for discharge from the hospital within 2 midnights of admission.   * I certify that at the point of admission it is my clinical judgment that the patient will require inpatient hospital care spanning beyond 2 midnights from the point of admission due to high intensity of service, high risk for further deterioration and high frequency of surveillance required.*  Author: Daved JAYSON Pump, DO 04/19/2024 2:42 PM  For on call review [email or web address].

## 2024-04-19 NOTE — ED Notes (Signed)
 Radiology at bedside

## 2024-04-19 NOTE — ED Notes (Signed)
 Report received from RN. Pt moved to 31. Pt ambulatory to bed. Pt in lowest, locked position, pt with non skid socks on, on CCM and pulse ox. Provided with warm blanket per request, denies any additional needs at this time, NAD noted, call bell within reach

## 2024-04-20 ENCOUNTER — Inpatient Hospital Stay

## 2024-04-20 DIAGNOSIS — I700 Atherosclerosis of aorta: Secondary | ICD-10-CM | POA: Diagnosis not present

## 2024-04-20 DIAGNOSIS — J939 Pneumothorax, unspecified: Secondary | ICD-10-CM | POA: Diagnosis not present

## 2024-04-20 DIAGNOSIS — Z4682 Encounter for fitting and adjustment of non-vascular catheter: Secondary | ICD-10-CM | POA: Diagnosis not present

## 2024-04-20 DIAGNOSIS — J9621 Acute and chronic respiratory failure with hypoxia: Secondary | ICD-10-CM

## 2024-04-20 LAB — BASIC METABOLIC PANEL WITH GFR
Anion gap: 11 (ref 5–15)
BUN: 13 mg/dL (ref 8–23)
CO2: 25 mmol/L (ref 22–32)
Calcium: 9.9 mg/dL (ref 8.9–10.3)
Chloride: 104 mmol/L (ref 98–111)
Creatinine, Ser: 0.54 mg/dL (ref 0.44–1.00)
GFR, Estimated: >60 mL/min (ref >60)
Glucose, Bld: 118 mg/dL — ABNORMAL HIGH (ref 70–99)
Potassium: 3.7 mmol/L (ref 3.5–5.1)
Sodium: 140 mmol/L (ref 135–145)

## 2024-04-20 LAB — CBC
HCT: 37.7 % (ref 36.0–46.0)
Hemoglobin: 12.5 g/dL (ref 12.0–15.0)
MCH: 34.7 pg — ABNORMAL HIGH (ref 26.0–34.0)
MCHC: 33.2 g/dL (ref 30.0–36.0)
MCV: 104.7 fL — ABNORMAL HIGH (ref 80.0–100.0)
Platelets: 524 K/uL — ABNORMAL HIGH (ref 150–400)
RBC: 3.6 MIL/uL — ABNORMAL LOW (ref 3.87–5.11)
RDW: 14.5 % (ref 11.5–15.5)
WBC: 7.2 K/uL (ref 4.0–10.5)
nRBC: 0 % (ref 0.0–0.2)

## 2024-04-20 MED ORDER — ALBUTEROL SULFATE (2.5 MG/3ML) 0.083% IN NEBU: 2.5000 mg | INHALATION_SOLUTION | RESPIRATORY_TRACT | Status: DC | PRN

## 2024-04-20 MED ORDER — EZETIMIBE 10 MG PO TABS
10.0000 mg | ORAL_TABLET | Freq: Every day | ORAL | Status: DC
  Administered 2024-04-20 – 2024-04-23 (×4): 10 mg via ORAL
  Filled 2024-04-20 (×4): qty 1

## 2024-04-20 MED ORDER — ROFLUMILAST 500 MCG PO TABS
500.0000 ug | ORAL_TABLET | Freq: Every day | ORAL | Status: DC
  Administered 2024-04-20 – 2024-04-23 (×4): 500 ug via ORAL
  Filled 2024-04-20 (×4): qty 1

## 2024-04-20 MED ORDER — REVEFENACIN 175 MCG/3ML IN SOLN
175.0000 ug | Freq: Every day | RESPIRATORY_TRACT | Status: DC
  Administered 2024-04-20 – 2024-04-23 (×4): 175 ug via RESPIRATORY_TRACT
  Filled 2024-04-20 (×4): qty 3

## 2024-04-20 MED ORDER — GUAIFENESIN ER 600 MG PO TB12
600.0000 mg | ORAL_TABLET | Freq: Two times a day (BID) | ORAL | Status: DC
  Administered 2024-04-20 – 2024-04-23 (×7): 600 mg via ORAL
  Filled 2024-04-20 (×7): qty 1

## 2024-04-20 MED ORDER — SERTRALINE HCL 50 MG PO TABS
100.0000 mg | ORAL_TABLET | Freq: Every day | ORAL | Status: DC
  Administered 2024-04-20 – 2024-04-23 (×4): 100 mg via ORAL
  Filled 2024-04-20 (×4): qty 2

## 2024-04-20 MED ORDER — ROSUVASTATIN CALCIUM 10 MG PO TABS
20.0000 mg | ORAL_TABLET | Freq: Every day | ORAL | Status: DC
  Administered 2024-04-20 – 2024-04-22 (×3): 20 mg via ORAL
  Filled 2024-04-20: qty 1
  Filled 2024-04-20 (×3): qty 2

## 2024-04-20 MED ORDER — LORATADINE 10 MG PO TABS
10.0000 mg | ORAL_TABLET | Freq: Every day | ORAL | Status: DC
  Administered 2024-04-20 – 2024-04-23 (×4): 10 mg via ORAL
  Filled 2024-04-20 (×4): qty 1

## 2024-04-20 NOTE — Progress Notes (Addendum)
 Pt arrived to room. AAOx4 VS stable pt on 5L [place] , pt placed on tele monitor. Chest tube, right side dressing clean dry and intact, hooked up to wall suction. Call bell in reach

## 2024-04-20 NOTE — ED Notes (Signed)
 NURSE CAMILLA RN INFORMED OF ASSIGNED BED

## 2024-04-20 NOTE — Progress Notes (Addendum)
 PROGRESS NOTE    Nancy Merritt  FMW:[id number] DOB: 1941-01-10 DOA: 04/19/2024 PCP: Rilla Baller, MD  Chief Complaint  Patient presents with   [facility] Course:  Nancy Merritt is a 83 y.o. 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  Patient says she missed stepped in her bedroom, fell onto her right side onto the nightstand.  She did not lose consciousness or sustain any additional injuries.  Prior to the fall she was at her baseline health. She lives at home with family.  Workup revealed right-sided rib fractures with moderate right hydropneumothorax. Chest tube was inserted by the ED provider.  Hospital course as below  Subjective: Patient was examined at the bedside, new to me today. Continues to have right-sided chest pain, otherwise denies any other complaints   Objective: Vitals:   04/19/24 2230 04/20/24 0200 04/20/24 0532 04/20/24 0730  BP: (!) 144/84 (!) 149/93 (!) 154/74 130/68  Pulse: 98 93 88 94  Resp: (!) 25 (!) 21 (!) 21 19  Temp: 98.1 F (36.7 C) 98 F (36.7 C) 97.8 F (36.6 C) 98.2 F (36.8 C)  TempSrc: Oral Oral Oral Oral  SpO2: 100% 100% 100% 100%  Weight:        Intake/Output Summary (Last 24 hours) at 04/20/2024 0913 Last data filed at 04/20/2024 9349 Gross per 24 hour  Intake 0 ml  Output 68 ml  Net -68 ml   Filed Weights   04/19/24 1014  Weight: 62 kg    Examination: Constitutional:      General: She is not in acute distress. Cardiovascular:     Rate and Rhythm: Normal rate and regular rhythm.  Pulmonary:     Effort: Pulmonary effort is normal. No respiratory distress.     Breath sounds: Rhonchi present.     Comments: Pigtail catheter in lower right ribs, dressing CDI  Chest:     Chest wall: Tenderness present.  Abdominal:      General: Bowel sounds are normal. There is no distension.     Tenderness: There is no abdominal tenderness. There is no guarding.  Skin:    General: Skin is warm and dry.  Neurological:     General: No focal deficit present.     Mental Status: She is alert and oriented to person, place, and time. Mental status is at baseline.     Cranial Nerves: No cranial nerve deficit.     Motor: No weakness.   Assessment & Plan:  Traumatic right sided rib fractures  Traumatic Hydropneumothorax, right  - Chest tube placed in the ED on 12/01 with less than 5% residual right ptx  - CXR AM today with trace right apical pneumothorax. - Pulmonary consult for chest tube management - Pain control, incentive spirometer, supplemental oxygen  - PT eval rec SNF  COPD exacerbation - stable, on 2L [place] with activity at home  - DuoNebs and Yupelri  - Flutter valve for mucociliary clearance   CAD  HTN - hold asa and plavix  for now - Continue Coreg , resume Entresto  as blood pressure tolerates  HLD - Zetia , statin   Essential thrombocytosis - Continue hydroxyurea    DVT prophylaxis: SCD's   Code Status: Do not attempt resuscitation (DNR) PRE-ARREST INTERVENTIONS DESIRED Disposition:  SNF  Consultants:  Pulmonary  Procedures:  Chest tube placement 12/01  Antimicrobials:  Anti-infectives (From admission, onward)    None       Data Reviewed: I have personally reviewed following labs and imaging studies CBC: Recent Labs  Lab 04/19/24 1205 04/20/24 0437  WBC 6.4 7.2  HGB 12.2 12.5  HCT 37.0 37.7  MCV 105.7* 104.7*  PLT 500* 524*   Basic Metabolic Panel: Recent Labs  Lab 04/19/24 1205 04/20/24 0437  NA 141 140  K 3.9 3.7  CL 106 104  CO2 26 25  GLUCOSE 134* 118*  BUN 12 13  CREATININE 0.63 0.54  CALCIUM  10.6* 9.9   GFR: Estimated Creatinine Clearance: 41.1 mL/min (by C-G formula based on SCr of 0.54 mg/dL). Liver Function Tests: No results for input(s): AST, ALT,  ALKPHOS, BILITOT, PROT, ALBUMIN in the last 168 hours. CBG: Recent Labs  Lab 04/19/24 2232  GLUCAP 152*    No results found for this or any previous visit (from the past 240 hours).   Radiology Studies: Portable chest 1 View Result Date: 04/20/2024 EXAM: 1 VIEW XRAY OF THE CHEST 04/20/2024 06:10:00 AM COMPARISON: 04/19/2024 CLINICAL HISTORY: Pneumothorax. FINDINGS: LINES, TUBES AND DEVICES: Right chest tube stable in position. LUNGS AND PLEURA: Linear opacity at right lung base consistent with atelectasis. Trace right apical pneumothorax. No pleural effusion. HEART AND MEDIASTINUM: Aortic arch atherosclerosis. Median sternotomy wires noted. No acute abnormality of the cardiac silhouettes. BONES AND SOFT TISSUES: Surgical clips in left axilla. No acute osseous abnormality. IMPRESSION: 1. Trace right apical pneumothorax. Unchanged from previous exam . 2. Right chest tube stable in position. Electronically signed by: Waddell Calk MD 04/20/2024 06:20 AM EST RP Workstation: [id number]   DG Chest Portable 1 View Result Date: 04/19/2024 EXAM: 1 VIEW XRAY OF THE CHEST 04/19/2024 01:54:00 PM COMPARISON: Chest radiograph of 12/27/23, compared to chest CT of earlier today. CLINICAL HISTORY: S/P chest tube FINDINGS: LINES, TUBES AND DEVICES: Right sided pleural pigtail catheter placement. LUNGS AND PLEURA: Bibasilar scarring. Suspected residual less than 5 percent right apical pneumothorax. HEART AND MEDIASTINUM: Pacer/defibrillator projects over the left hemithorax. BONES AND SOFT TISSUES: Fracture of the 3rd sternal wire, median sternotomy. IMPRESSION: 1. Right-sided pleural pigtail catheter placement with suspicion of a less than 5% residual right apical pneumothorax. Electronically signed by: Rockey Kilts MD 04/19/2024 02:41 PM EST RP Workstation: [id number]   CT Chest Wo Contrast Result Date: 04/19/2024 CLINICAL DATA:  Chest trauma, fall, pain. EXAM: CT CHEST WITHOUT CONTRAST TECHNIQUE:  Multidetector CT imaging of the chest was performed following the standard protocol without IV contrast. RADIATION DOSE REDUCTION: This exam was performed according to the departmental dose-optimization program which includes automated exposure control, adjustment of the mA and/or kV according to patient size and/or use of iterative reconstruction technique. COMPARISON:  05/26/2023. FINDINGS: Cardiovascular: Atherosclerotic calcification of the aorta, aortic valve and coronary arteries. Enlarged pulmonic trunk and heart. No pericardial effusion. Mediastinum/Nodes: No pathologically enlarged mediastinal or axillary lymph nodes. Hilar regions are difficult to definitively evaluate without IV contrast. 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 Air in the esophagus can be seen with dysmotility. Lungs/Pleura: Moderate right pneumothorax with a small right pleural effusion. Centrilobular emphysema. Focal scarring and consolidation in the medial segment right middle lobe. Dependent atelectasis in the right lower lobe. Postinfectious peribronchovascular nodularity in the left upper and left lower lobes. Scattered mucoid impaction and peribronchial thickening. No pleural fluid. Debris in the airway. Upper Abdomen: Small hiatal hernia. Fluid density mass in the left upper quadrant measures 2.9 x 5.6 cm, as on 05/26/2023  favoring a benign etiology such as a lymphangioma. Visualized portions of the liver, gallbladder, adrenal glands, kidneys, spleen, pancreas, stomach and bowel are otherwise grossly unremarkable. No upper abdominal adenopathy. Musculoskeletal: Degenerative changes in the spine. Osteopenia. There are fractures of the right sixth and seventh posterolateral ribs. IMPRESSION: 1. Right sixth and seventh posterior rib fractures with a moderate right hydropneumothorax. Critical Value/emergent results were called by telephone at the time of interpretation on 04/19/2024 at 11:53 am to provider LAMAR PRICE  , who verbally acknowledged these results. 2. Probable scarring and compressive atelectasis in the medial segment right middle lobe. Consider follow-up CT chest without contrast in 4-6 weeks in further evaluation, as malignancy cannot be excluded. 3. Aortic atherosclerosis (ICD10-I70.0). Coronary artery calcification. 4. Enlarged pulmonic trunk, indicative of pulmonary arterial hypertension. 5.  Emphysema (ICD10-J43.9). Electronically Signed   By: Newell Eke M.D.   On: 04/19/2024 11:53    Scheduled Meds:  arformoterol   15 mcg Nebulization BID   budesonide   0.5 mg Nebulization BID   carvedilol   6.25 mg Oral BID WC   hydroxyurea   1,500 mg Oral Daily   Continuous Infusions:   LOS: 1 day  MDM: Patient is high risk for one or more organ failure.  They necessitate ongoing hospitalization for continued IV therapies and subsequent lab monitoring. Total time spent interpreting labs and vitals, reviewing the medical record, coordinating care amongst consultants and care team members, directly assessing and discussing care with the patient and/or family: 55 min Laree Lock, MD [facility]  To contact the attending physician between 7A-7P please use Epic Chat. To contact the covering physician during after hours 7P-7A, please review Amion.  04/20/2024, 9:13 AM   *This document has been created with the assistance of dictation software. Please excuse typographical errors. *

## 2024-04-20 NOTE — NC FL2 (Signed)
 [place]  MEDICAID FL2 LEVEL OF CARE FORM     IDENTIFICATION  Patient Name: Nancy Merritt Birthdate: 27-Sep-1940 Sex: female Admission Date (Current Location): 04/19/2024  [facility] and [place] Number:  Chiropodist and Address:  [facility], [address] [place], [place] [address]      Provider Number: [id number]  Attending Physician Name and Address:  Jerelene Critchley, MD  Relative Name and Phone Number:  [name], daughter [phone]    Current Level of Care: Hospital Recommended Level of Care: Skilled Nursing Facility Prior Approval Number:    Date Approved/Denied:   PASRR Number:    Discharge Plan: SNF    Current Diagnoses: Patient Active Problem List   Diagnosis Date Noted   Pneumothorax 04/19/2024   COPD exacerbation (HCC) 12/27/2023   Hyperlipidemia 12/27/2023   Thoracic ascending aortic aneurysm 08/18/2023   MDD (major depressive disorder), recurrent episode, moderate (HCC) 03/14/2023   Palmar nodule, right 02/15/2023   Polypharmacy 02/14/2023   Allergic rhinitis 08/17/2022   Earlobe lesion, left 08/17/2022   Skin lesion of right leg 08/17/2022   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/2022   Carotid bruit 08/17/2022   Encounter for general adult medical examination with abnormal findings 08/13/2022   Chronic combined systolic and diastolic heart failure (HCC) 06/05/2022   NSTEMI (non-ST elevated myocardial infarction) (HCC) 06/04/2022   Prediabetes 01/31/2022   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   Hearing loss of both ears due to cerumen impaction 07/11/2020   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/18/2020   History of CVA (cerebrovascular accident) without residual deficits 05/18/2020   COPD (chronic obstructive pulmonary disease) (HCC) 08/19/2019   Advance directive discussed with patient 11/09/2018   Primary  hyperparathyroidism 04/13/2018   Mild persistent asthma 04/07/2018   Hypertension 04/07/2018   Hx of breast cancer 04/07/2018   CAD (coronary artery disease) 04/07/2018    Orientation RESPIRATION BLADDER Height & Weight     Self, Time, Situation, Place  O2 (Chronic 2L) Continent Weight: 62 kg Height:     BEHAVIORAL SYMPTOMS/MOOD NEUROLOGICAL BOWEL NUTRITION STATUS      Continent Diet (Regular)  AMBULATORY STATUS COMMUNICATION OF NEEDS Skin   Extensive Assist Verbally Normal                       Personal Care Assistance Level of Assistance  Bathing, Feeding, Dressing Bathing Assistance: Limited assistance Feeding assistance: Independent Dressing Assistance: Maximum assistance     Functional Limitations Info  Sight, Hearing, Speech Sight Info: Impaired (glasses) Hearing Info: Impaired (hearing aids) Speech Info: Adequate    SPECIAL CARE FACTORS FREQUENCY  PT (By licensed PT), OT (By licensed OT)     PT Frequency: 5 days per week OT Frequency: 5 days per week            Contractures Contractures Info: Not present    Additional Factors Info  Code Status, Allergies Code Status Info: If pulseless and not breathing No CPR or chest compressions.  In Pre-Arrest Conditions (Patient Has Pulse and Is Breathing) May intubate, use advanced airway interventions and cardioversion/ACLS medications if appropriate or indicated. May transfer to ICU.  Consent: Discussion documented in EHR or advanced directives reviewed   DNR limited interventions Allergies Info: Advair Diskus (fluticasone -salmeterol) High Allergy Shortness Of Breath As of 09/01/2014. Pt can take Breo  Bee Pollen High  Other (See Comments)   Budesonide -formoterol Fumarate High  Allergy Other (See Comments) Hard time breathing as of 10/31/2014.  Levofloxacin High Allergy Other (See Comments) Nausea, brain fog, depression, as of 12/12/14  Pollen Extract High Allergy Cough   Qvar (beclomethasone) High Allergy Other (See  Comments) Lungs closing-as of 05/03/2015  Singulair (montelukast) High Allergy Shortness Of Breath           Current Medications (04/20/2024):  This is the current hospital active medication list Current Facility-Administered Medications  Medication Dose Route Frequency Provider Last Rate Last Admin   acetaminophen  (TYLENOL ) tablet 650 mg  650 mg Oral Q6H PRN Krugh, Marissa C, DO       Or   acetaminophen  (TYLENOL ) suppository 650 mg  650 mg Rectal Q6H PRN Krugh, Marissa C, DO       albuterol  (PROVENTIL ) (2.5 MG/3ML) 0.083% nebulizer solution 2.5 mg  2.5 mg Nebulization Q4H PRN Tamea Dedra CROME, MD       arformoterol  (BROVANA ) nebulizer solution 15 mcg  15 mcg Nebulization BID Krugh, Marissa C, DO   15 mcg at 04/20/24 9040   budesonide  (PULMICORT ) nebulizer solution 0.5 mg  0.5 mg Nebulization BID Krugh, Marissa C, DO   0.5 mg at 04/20/24 9040   carvedilol  (COREG ) tablet 6.25 mg  6.25 mg Oral BID WC Krugh, Marissa C, DO   6.25 mg at 04/20/24 9255   ezetimibe  (ZETIA ) tablet 10 mg  10 mg Oral Daily Ponnala, Shruthi, MD   10 mg at 04/20/24 9041   guaiFENesin  (MUCINEX ) 12 hr tablet 600 mg  600 mg Oral BID Ponnala, Shruthi, MD   600 mg at 04/20/24 1534   hydroxyurea  (HYDREA ) capsule 1,500 mg  1,500 mg Oral Daily Krugh, Marissa C, DO   1,500 mg at 04/20/24 9041   loratadine  (CLARITIN ) tablet 10 mg  10 mg Oral Daily Ponnala, Shruthi, MD   10 mg at 04/20/24 9041   morphine  (PF) 2 MG/ML injection 2 mg  2 mg Intravenous Q2H PRN Krugh, Marissa C, DO       ondansetron  (ZOFRAN ) tablet 4 mg  4 mg Oral Q6H PRN Krugh, Marissa C, DO       Or   ondansetron  (ZOFRAN ) injection 4 mg  4 mg Intravenous Q6H PRN Krugh, Marissa C, DO       oxyCODONE (Oxy IR/ROXICODONE) immediate release tablet 5 mg  5 mg Oral Q4H PRN Krugh, Marissa C, DO   5 mg at 04/20/24 9041   revefenacin (YUPELRI) nebulizer solution 175 mcg  175 mcg Nebulization Daily Tamea Dedra CROME, MD   175 mcg at 04/20/24 1333   roflumilast  (DALIRESP )  tablet 500 mcg  500 mcg Oral Daily Ponnala, Shruthi, MD   500 mcg at 04/20/24 1056   rosuvastatin  (CRESTOR ) tablet 20 mg  20 mg Oral QHS Ponnala, Shruthi, MD       senna-docusate (Senokot-S) tablet 1 tablet  1 tablet Oral QHS PRN Krugh, Marissa C, DO       sertraline  (ZOLOFT ) tablet 100 mg  100 mg Oral Daily Ponnala, Shruthi, MD   100 mg at 04/20/24 9041   Current Outpatient Medications  Medication Sig Dispense Refill   albuterol  (VENTOLIN  HFA) 108 (90 Base) MCG/ACT inhaler INHALE 2 PUFFS INTO THE LUNGS EVERY 4 HOURS AS NEEDED FOR WHEEZING OR SHORTNESS OF BREATH. 6.7 each 3   arformoterol  (BROVANA ) 15 MCG/2ML NEBU Take 2 mLs (15 mcg total) by nebulization 2 (two) times daily. 120 mL 12   aspirin  81 MG chewable tablet Chew 1 tablet (81 mg total) by  mouth daily.     budesonide  (PULMICORT ) 0.5 MG/2ML nebulizer solution Take 2 mLs (0.5 mg total) by nebulization 2 (two) times daily. 1440 mL 0   carvedilol  (COREG ) 6.25 MG tablet TAKE 1 TABLET BY MOUTH 2 TIMES DAILY WITH A MEAL. 180 tablet 3   Cholecalciferol  (VITAMIN D3) 25 MCG (1000 UT) capsule Take 2 capsules (2,000 Units total) by mouth daily. 30 capsule    clopidogrel  (PLAVIX ) 75 MG tablet Take 1 tablet (75 mg total) by mouth daily with breakfast. 90 tablet 3   ezetimibe  (ZETIA ) 10 MG tablet TAKE 1 TABLET BY MOUTH EVERY DAY 90 tablet 3   fluticasone  (FLONASE ) 50 MCG/ACT nasal spray Place 2 sprays into both nostrils daily. 16 g 12   hydroxyurea  (HYDREA ) 500 MG capsule Take 3 capsules (1,500 mg total) by mouth daily. May take with food to minimize GI side effects. 270 capsule 2   loratadine  (CLARITIN ) 10 MG tablet Take 10 mg by mouth daily.     Multiple Vitamin (MULTIVITAMIN) tablet Take 1 tablet by mouth daily.     Multiple Vitamins-Minerals (PRESERVISION AREDS 2) CAPS Take 2 each by mouth daily at 12 noon.     nitroGLYCERIN  (NITROSTAT ) 0.4 MG SL tablet Place 1 tablet (0.4 mg total) under the tongue every 5 (five) minutes as needed for chest pain.  25 tablet 3   roflumilast  (DALIRESP ) 500 MCG TABS tablet TAKE 1 TABLET BY MOUTH DAILY 90 tablet 3   rosuvastatin  (CRESTOR ) 20 MG tablet Take 1 tablet (20 mg total) by mouth at bedtime. 90 tablet 3   sacubitril -valsartan  (ENTRESTO ) 24-26 MG Take 2 tablets by mouth 2 (two) times daily. (Patient taking differently: Take 1 tablet by mouth 2 (two) times daily.)     sertraline  (ZOLOFT ) 100 MG tablet Take 1 tablet (100 mg total) by mouth daily. 90 tablet 4   acetaminophen  (TYLENOL ) 500 MG tablet Take 1 tablet (500 mg total) by mouth 3 (three) times daily as needed for moderate pain.     doxycycline  (VIBRA -TABS) 100 MG tablet Take 1 tablet (100 mg total) by mouth 2 (two) times daily. (Patient not taking: Reported on 04/19/2024) 20 tablet 0   ipratropium-albuterol  (DUONEB) 0.5-2.5 (3) MG/3ML SOLN Take 3 mLs by nebulization every 4 (four) hours as needed. (Patient not taking: Reported on 04/19/2024) 360 mL 10     Discharge Medications: Please see discharge summary for a list of discharge medications.  Relevant Imaging Results:  Relevant Lab Results:   Additional Information SS# [id number]  Nathanael CHRISTELLA Ring, RN

## 2024-04-20 NOTE — TOC Initial Note (Signed)
 Transition of Care [facility]) - Initial/Assessment Note    Patient Details  Name: Nancy Merritt MRN: [id number] Date of Birth: 1940/11/07  [facility] CM/SW Contact:    Nathanael CHRISTELLA Ring, RN Phone Number: 04/20/2024, 4:31 PM  Clinical Narrative:                 Met with participant at the bedside, she is alert and oriented, HOH, wearing hearing aids.  She is on chronic oxygen  at home, lives with her daughter and son in social worker.  At baseline independent in ADL's.  She currently has a chest tube in.  She did work with PT and current recommendation for SNF but she said that was related to the chest tube.  She will agree to whatever recommendations are made.  Family is available for transportation, she does do some driving but not much due to her vision loss.  Will start on SNF workup.   Expected Discharge Plan: Skilled Nursing Facility Barriers to Discharge: Continued Medical Work up   Patient Goals and CMS Choice Patient states their goals for this hospitalization and ongoing recovery are:: Wants to live to 120- also wants to get back to where she was before she fell, back to doing the things she likes to do. CMS Medicare.gov Compare Post Acute Care list provided to:: Patient Choice offered to / list presented to : Patient      Expected Discharge Plan and Services   Discharge Planning Services: CM Consult Post Acute Care Choice: Skilled Nursing Facility Living arrangements for the past 2 months: Single Family Home                 DME Arranged: N/A                    Prior Living Arrangements/Services Living arrangements for the past 2 months: Single Family Home Lives with:: Adult Children Patient language and need for interpreter reviewed:: Yes Do you feel safe going back to the place where you live?: Yes      Need for Family Participation in Patient Care: Yes (Comment) Care giver support system in place?: Yes (comment) Current home services: DME Elene, Walker,  oxygen ) Criminal Activity/Legal Involvement Pertinent to Current Situation/Hospitalization: No - Comment as needed  Activities of Daily Living      Permission Sought/Granted Permission sought to share information with : Facility Industrial/product Designer granted to share information with : Yes, Verbal Permission Granted  Share Information with NAME: [name]  Permission granted to share info w AGENCY: SNF's  Permission granted to share info w Relationship: daughter  Permission granted to share info w Contact Information: [phone]  Emotional Assessment Appearance:: Appears stated age Attitude/Demeanor/Rapport: Engaged Affect (typically observed): Accepting Orientation: : Oriented to Self, Oriented to Place, Oriented to  Time, Oriented to Situation Alcohol / Substance Use: Not Applicable Psych Involvement: No (comment)  Admission diagnosis:  Pneumothorax [J93.9] Patient Active Problem List   Diagnosis Date Noted   Pneumothorax 04/19/2024   COPD exacerbation (HCC) 12/27/2023   Hyperlipidemia 12/27/2023   Thoracic ascending aortic aneurysm 08/18/2023   MDD (major depressive disorder), recurrent episode, moderate (HCC) 03/14/2023   Palmar nodule, right 02/15/2023   Polypharmacy 02/14/2023   Allergic rhinitis 08/17/2022   Earlobe lesion, left 08/17/2022   Skin lesion of right leg 08/17/2022   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/2022   Carotid bruit 08/17/2022   Encounter for general adult medical examination with abnormal findings 08/13/2022   Chronic combined systolic  and diastolic heart failure (HCC) 06/05/2022   NSTEMI (non-ST elevated myocardial infarction) (HCC) 06/04/2022   Prediabetes 01/31/2022   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   Hearing loss of both ears due to cerumen impaction 07/11/2020   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 [place] [address] Phone: [phone] Fax: [phone]     Social Drivers of Health (SDOH) Social History: SDOH Screenings   Food Insecurity: Low Risk  (03/25/2024)   Received from [facility]  Housing: Low Risk  (03/25/2024)   Received from [facility]  Transportation Needs: No Transportation Needs (03/25/2024)   Received from [facility]  Utilities: Low Risk  (03/25/2024)   Received from [facility]  Alcohol Screen: Low Risk  (07/01/2023)  Depression (PHQ2-9): Low Risk  (07/01/2023)  Recent Concern: Depression (PHQ2-9) - High Risk (04/29/2023)  Financial Resource Strain: Low Risk  (07/01/2023)  Physical Activity: Inactive (07/01/2023)  Social Connections: Moderately Isolated (12/27/2023)  Stress: No Stress Concern Present (07/01/2023)  Tobacco Use: Medium Risk (04/01/2024)   Received from [facility]  Health Literacy: Adequate Health Literacy (07/01/2023)   SDOH Interventions:     Readmission Risk Interventions     No data to display

## 2024-04-20 NOTE — Evaluation (Addendum)
 Physical Therapy Evaluation Patient Details Name: Nancy Merritt MRN: [id number] DOB: 13-Aug-1940 Today's Date: 04/20/2024  History of Present Illness  Patient is a 83 year old female with fall and right side rib pain. CT scan of the chest revealed right sided rib fractures with a moderate  right hydropneumothorax. 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  Clinical Impression  Patient is agreeable to PT evaluation. She reports history of multiple falls. She usually ambulates without assistive device in the home but has a cane and a rollator if needed. She lives with her daughter.  Today the patient reports mild right flank pain with movement. She required physical assistance with mobility and feels unsteady with standing. Unable to progress walking due to chest tube with wall suction. She is fatigued with activity with dyspnea with exertion. The patient is hopeful to return home at discharge but will likely require caregiver support. Consider rehabilitation < 3 hours/day pending progress with functional independence and activity tolerance. PT will continue to follow.       If plan is discharge home, recommend the following: A little help with walking and/or transfers;A little help with bathing/dressing/bathroom;Assistance with cooking/housework;Help with stairs or ramp for entrance   Can travel by private vehicle   Yes    Equipment Recommendations None recommended by PT  Recommendations for Other Services    OT consult    Functional Status Assessment Patient has had a recent decline in their functional status and demonstrates the ability to make significant improvements in function in a reasonable and predictable amount of time.     Precautions / Restrictions Precautions Precautions: Fall Recall of Precautions/Restrictions: Intact Restrictions Weight Bearing Restrictions Per Provider Order: No      Mobility   Bed Mobility Overal bed mobility: Needs Assistance Bed Mobility: Supine to Sit, Sit to Supine     Supine to sit: Min assist Sit to supine: Min assist   General bed mobility comments: assistance for LE and trunk support. cues for technique    Transfers Overall transfer level: Needs assistance Equipment used: 1 person hand held assist Transfers: Bed to chair/wheelchair/BSC, Sit to/from Stand Sit to Stand: Min assist Stand pivot transfers: Min assist         General transfer comment: steadying assistance provided for completing multiple transfers including to and from bed side commode. cues for safety and hand placement    Ambulation/Gait               General Gait Details: not attempted due to limitation of chest tube to wall suction  Stairs            Wheelchair Mobility     Tilt Bed    Modified Rankin (Stroke Patients Only)       Balance Overall balance assessment: Needs assistance Sitting-balance support: Feet supported Sitting balance-Leahy Scale: Fair     Standing balance support: Single extremity supported Standing balance-Leahy Scale: Poor Standing balance comment: external support required                             Pertinent Vitals/Pain Pain Assessment Pain Assessment: Faces Faces Pain Scale: Hurts little more Pain Location: right flank Pain Descriptors / Indicators: Discomfort Pain Intervention(s): Limited activity within patient's tolerance, Monitored during session, Repositioned    Home Living Family/patient expects to be discharged to:: Private residence Living Arrangements: Children Available Help at Discharge:  Family Type of Home: House Home Access: Stairs to enter Entrance Stairs-Rails: Right Entrance Stairs-Number of Steps: 5   Home Layout: One level Home Equipment: Rollator (4 wheels);Cane - single point Additional Comments: PRN oxygen     Prior Function Prior Level of Function : Independent/Modified  Independent             Mobility Comments: Mod I for ambulating around the home without device. History of multiple falls. Has a cane (PRN inside the house) and rollator (PRN in the community) ADLs Comments: independent per patient reports. she states she still cooks some. does not drive     Extremity/Trunk Assessment   Upper Extremity Assessment Upper Extremity Assessment: Overall WFL for tasks assessed    Lower Extremity Assessment Lower Extremity Assessment: Generalized weakness       Communication   Communication Communication: Impaired Factors Affecting Communication: Hearing impaired    Cognition Arousal: Alert Behavior During Therapy: WFL for tasks assessed/performed   PT - Cognitive impairments: No apparent impairments                         Following commands: Intact       Cueing Cueing Techniques: Verbal cues     General Comments General comments (skin integrity, edema, etc.): patient mildly short of breath with activity and requires frequent rest breaks    Exercises     Assessment/Plan    PT Assessment Patient needs continued PT services  PT Problem List Decreased strength;Decreased range of motion;Decreased activity tolerance;Decreased balance;Decreased mobility;Decreased safety awareness       PT Treatment Interventions DME instruction;Gait training;Stair training;Functional mobility training;Therapeutic activities;Therapeutic exercise;Balance training;Neuromuscular re-education;Patient/family education;Cognitive remediation    PT Goals (Current goals can be found in the Care Plan section)  Acute Rehab PT Goals Patient Stated Goal: to go home PT Goal Formulation: With patient Time For Goal Achievement: 05/04/24 Potential to Achieve Goals: Fair    Frequency Min 2X/week     Co-evaluation               AM-PAC PT 6 Clicks Mobility  Outcome Measure Help needed turning from your back to your side while in a flat bed  without using bedrails?: A Lot Help needed moving from lying on your back to sitting on the side of a flat bed without using bedrails?: A Lot Help needed moving to and from a bed to a chair (including a wheelchair)?: A Little Help needed standing up from a chair using your arms (e.g., wheelchair or bedside chair)?: A Little Help needed to walk in hospital room?: A Little Help needed climbing 3-5 steps with a railing? : A Lot 6 Click Score: 15    End of Session   Activity Tolerance: Patient tolerated treatment well Patient left: in bed;with call bell/phone within reach;with bed alarm set Nurse Communication: Mobility status PT Visit Diagnosis: Muscle weakness (generalized) (M62.81);Unsteadiness on feet (R26.81)    Time: 9088-9065 PT Time Calculation (min) (ACUTE ONLY): 23 min   Charges:   PT Evaluation $PT Eval Moderate Complexity: 1 Mod PT Treatments $Therapeutic Activity: 8-22 mins PT General Charges $$ ACUTE PT VISIT: 1 Visit         Randine Essex, PT, MPT  Randine LULLA Essex 04/20/2024, 11:04 AM

## 2024-04-20 NOTE — Consult Note (Signed)
 NAME:  Nancy Merritt, MRN:  [id number], DOB:  August 02, 1940, LOS: 1 ADMISSION DATE:  04/19/2024, CONSULTATION DATE: 04/20/2024 REFERRING MD: Laree Ponnala,MD, CHIEF COMPLAINT: Pneumothorax, chest tube management requested.  History of Present Illness:  Patient is an 83 year old former smoker with a history of asthma COPD overlap syndrome, chronic respiratory failure and bronchiectasis who presented on 19 April 2024 after sustaining a fall at home striking the right side of her chest on her nightstand.  She did not have loss of consciousness.  She states she missed a step in her bedroom as the cause of the fall.  She had persistent rib cage pain so she presented to the emergency room.  A chest CT performed showed a moderate right pneumothorax, small right pleural effusion, emphysema and fractures of the right 6th and 7th posterolateral ribs.  She had a chest tube inserted in the emergency room.  We are asked to of chest tube management.  The patient has noted some increased congestion and cough which preceded the aforementioned trauma.  She has not had any fevers or chills.  Currently she is having difficulty bringing up secretions due to chest pain when coughing due to her rib fractures.  She does not endorse any fevers, chills or sweats.  Since the chest tube was placed she feels her dyspnea is at baseline.  Significant Hospital Events: Including procedures, antibiotic start and stop dates in addition to other pertinent events   04/19/2024 right traumatic pneumothorax, chest tube placed 04/20/2024 PCCM consulted  Interim History / Subjective:  Reports shortness of breath at baseline after chest tube placement.  Congested cough but difficult to expectorate sputum.  Objective    Blood pressure 121/73, pulse 74, temperature 98.8 F (37.1 C), resp. rate 20, weight 62 kg, SpO2 100%.    SpO2: 100 % O2 Flow Rate (L/min): 4 L/min     Intake/Output Summary (Last 24 hours) at 04/20/2024  1251 Last data filed at 04/20/2024 9349 Gross per 24 hour  Intake 0 ml  Output 68 ml  Net -68 ml   Filed Weights   04/19/24 1014  Weight: 62 kg    Examination: General: Frail appearing elderly woman, sitting up in bed.  Chronic use of accessory use of respiration.  Mild conversational dyspnea.  Awake and alert. HEENT: Poor dentition, chipped teeth.  Oral mucosa moist.  No thrush. Lungs: Rhonchi throughout.  No wheezes.  Right pigtail 14 French chest tube in place.   Cardiovascular: Regular rate and rhythm, no rubs murmurs gallops heard. Abdomen: Benign. Extremities: Trace edema bilaterally.  No joint deformities. Neuro: Grossly intact. GU: Pure wick in place.  Chest tube: Low leak 2/7, continuous  Imaging   Representative image from CT performed 19 April 2024 showing pneumothorax on the right:   Chest x-ray today showing chest tube in place with trace right apical pneumothorax:   Assessment and Plan  Acute on chronic respiratory failure due to traumatic right pneumothorax Hx: COPD/asthma/bronchiectasis Has low-grade persistent leak on chest tube 2/7,continuous Continue chest tube to -20 cm H2O suction Reassess with chest x-ray in the a.m. Continue supplemental O2 to maintain oxygen  saturations at 90% or better  COPD exacerbation due to the above Optimize nebulization treatments Add Yupelri to therapy Acapella flutter valve for mucociliary clearance   Labs   CBC: Recent Labs  Lab 04/19/24 1205 04/20/24 0437  WBC 6.4 7.2  HGB 12.2 12.5  HCT 37.0 37.7  MCV 105.7* 104.7*  PLT 500* 524*    Basic Metabolic  Panel: Recent Labs  Lab 04/19/24 1205 04/20/24 0437  NA 141 140  K 3.9 3.7  CL 106 104  CO2 26 25  GLUCOSE 134* 118*  BUN 12 13  CREATININE 0.63 0.54  CALCIUM  10.6* 9.9   GFR: Estimated Creatinine Clearance: 41.1 mL/min (by C-G formula based on SCr of 0.54 mg/dL). Recent Labs  Lab 04/19/24 1205 04/20/24 0437  WBC 6.4 7.2    Liver Function  Tests: No results for input(s): AST, ALT, ALKPHOS, BILITOT, PROT, ALBUMIN in the last 168 hours. No results for input(s): LIPASE, AMYLASE in the last 168 hours. No results for input(s): AMMONIA in the last 168 hours.  ABG    Component Value Date/Time   HCO3 24.4 05/08/2019 1743   TCO2 26 05/08/2019 1743   O2SAT 92.0 05/08/2019 1743     Coagulation Profile: No results for input(s): INR, PROTIME in the last 168 hours.  Cardiac Enzymes: No results for input(s): CKTOTAL, CKMB, CKMBINDEX, TROPONINI in the last 168 hours.  HbA1C: Hgb A1c MFr Bld  Date/Time Value Ref Range Status  08/08/2023 10:41 AM 5.9 4.6 - 6.5 % Final    Comment:    Glycemic Control Guidelines for People with Diabetes:Non Diabetic:  <6%Goal of Therapy: <7%Additional Action Suggested:  >8%   08/13/2022 01:38 PM 6.5 4.6 - 6.5 % Final    Comment:    Glycemic Control Guidelines for People with Diabetes:Non Diabetic:  <6%Goal of Therapy: <7%Additional Action Suggested:  >8%     CBG: Recent Labs  Lab 04/19/24 2232  GLUCAP 152*    Review of Systems:   A 10 point review of systems was performed and it is as noted above otherwise negative.  Past Medical History:  She,  has a past medical history of Actinic keratosis, Arthritis, Asthma, Basal cell carcinoma (08/18/2023), Cancer (HCC) (1990), CHF (congestive heart failure) (HCC), COPD (chronic obstructive pulmonary disease) (HCC), Coronary artery disease, COVID-19 (06/14/2020), Depression, breast cancer (04/07/2018), and Hypertension.   Surgical History:   Past Surgical History:  Procedure Laterality Date   BREAST SURGERY Left 1990   CORONARY ARTERY BYPASS GRAFT     JOINT REPLACEMENT Right    Hip   LEFT HEART CATH AND CORS/GRAFTS ANGIOGRAPHY N/A 06/05/2022   Procedure: LEFT HEART CATH AND CORS/GRAFTS ANGIOGRAPHY;  Surgeon: Darron Deatrice LABOR, MD;  Location: [facility];  Service: Cardiovascular;  Laterality: N/A;   OVARIAN  CYST REMOVAL  1970   TUBAL LIGATION  1970     Social History:   reports that she quit smoking about 23 years ago. Her smoking use included cigarettes. She started smoking about 63 years ago. She has a 40 pack-year smoking history. She has never used smokeless tobacco. She reports that she does not currently use alcohol. She reports that she does not use drugs.   Family History:  Her family history includes Alcohol abuse in her brother, daughter, father, and maternal grandfather; Arthritis in her brother, daughter, daughter, daughter, father, maternal grandfather, maternal grandmother, mother, sister, and sister; Asthma in her daughter; Breast cancer (age of onset: 51) in her sister; COPD in her daughter, daughter, father, mother, and sister; Cancer in her father, maternal grandfather, and sister; Depression in her daughter, daughter, daughter, and maternal grandfather; Diabetes in her father and mother; Drug abuse in her sister; Hearing loss in her daughter, maternal grandfather, and mother; Heart attack (age of onset: 73) in her father; Hypertension in her brother, daughter, daughter, daughter, father, maternal grandfather, maternal grandmother, mother, and  sister; Miscarriages / Stillbirths in her daughter, mother, and sister; Stroke in her maternal grandmother and mother; Thyroid  disease in her daughter, daughter, daughter, maternal grandmother, sister, and sister.   Allergies Allergies  Allergen Reactions   Advair Diskus [Fluticasone -Salmeterol] Shortness Of Breath    As of 09/01/2014. Pt can take Breo   Bee Pollen Other (See Comments)   Budesonide -Formoterol Fumarate Other (See Comments)    Hard time breathing as of 10/31/2014.   Levofloxacin Other (See Comments)    Nausea, brain fog, depression, as of 12/12/14   Pollen Extract Cough   Qvar [Beclomethasone] Other (See Comments)    Lungs closing-as of 05/03/2015   Singulair [Montelukast] Shortness Of Breath     Home Medications  Prior to  Admission medications   Medication Sig Start Date End Date Taking? Authorizing Provider  albuterol  (VENTOLIN  HFA) 108 (90 Base) MCG/ACT inhaler INHALE 2 PUFFS INTO THE LUNGS EVERY 4 HOURS AS NEEDED FOR WHEEZING OR SHORTNESS OF BREATH. 11/04/23  Yes Rilla Baller, MD  arformoterol  (BROVANA ) 15 MCG/2ML NEBU Take 2 mLs (15 mcg total) by nebulization 2 (two) times daily. 10/15/23  Yes Kasa, Kurian, MD  aspirin  81 MG chewable tablet Chew 1 tablet (81 mg total) by mouth daily. 06/07/22  Yes Meng, Hao, PA  budesonide  (PULMICORT ) 0.5 MG/2ML nebulizer solution Take 2 mLs (0.5 mg total) by nebulization 2 (two) times daily. 10/15/23 10/14/24 Yes Kasa, Kurian, MD  carvedilol  (COREG ) 6.25 MG tablet TAKE 1 TABLET BY MOUTH 2 TIMES DAILY WITH A MEAL. 09/29/23  Yes Meng, Hao, PA  Cholecalciferol  (VITAMIN D3) 25 MCG (1000 UT) capsule Take 2 capsules (2,000 Units total) by mouth daily. 08/15/22  Yes Rilla Baller, MD  clopidogrel  (PLAVIX ) 75 MG tablet Take 1 tablet (75 mg total) by mouth daily with breakfast. 10/17/23  Yes Okey Vina GAILS, MD  ezetimibe  (ZETIA ) 10 MG tablet TAKE 1 TABLET BY MOUTH EVERY DAY 06/17/23  Yes Meng, Hao, PA  fluticasone  (FLONASE ) 50 MCG/ACT nasal spray Place 2 sprays into both nostrils daily. 08/15/23  Yes Rilla Baller, MD  hydroxyurea  (HYDREA ) 500 MG capsule Take 3 capsules (1,500 mg total) by mouth daily. May take with food to minimize GI side effects. 06/19/23  Yes Jacobo Evalene PARAS, MD  loratadine  (CLARITIN ) 10 MG tablet Take 10 mg by mouth daily.   Yes [provider]  Multiple Vitamin (MULTIVITAMIN) tablet Take 1 tablet by mouth daily.   Yes [provider]  Multiple Vitamins-Minerals (PRESERVISION AREDS 2) CAPS Take 2 each by mouth daily at 12 noon.   Yes [provider]  nitroGLYCERIN  (NITROSTAT ) 0.4 MG SL tablet Place 1 tablet (0.4 mg total) under the tongue every 5 (five) minutes as needed for chest pain. 06/06/22  Yes Meng, Hao, PA  roflumilast   (DALIRESP ) 500 MCG TABS tablet TAKE 1 TABLET BY MOUTH DAILY 02/05/24  Yes Kasa, Kurian, MD  rosuvastatin  (CRESTOR ) 20 MG tablet Take 1 tablet (20 mg total) by mouth at bedtime. 06/06/22  Yes Meng, Hao, PA  sacubitril -valsartan  (ENTRESTO ) 24-26 MG Take 2 tablets by mouth 2 (two) times daily. Patient taking differently: Take 1 tablet by mouth 2 (two) times daily. 06/24/23  Yes Okey Vina GAILS, MD  sertraline  (ZOLOFT ) 100 MG tablet Take 1 tablet (100 mg total) by mouth daily. 08/15/23  Yes Rilla Baller, MD  acetaminophen  (TYLENOL ) 500 MG tablet Take 1 tablet (500 mg total) by mouth 3 (three) times daily as needed for moderate pain. 06/11/22   Rilla Baller, MD  doxycycline  (  VIBRA -TABS) 100 MG tablet Take 1 tablet (100 mg total) by mouth 2 (two) times daily. Patient not taking: Reported on 04/19/2024 12/10/23   Tamea Dedra CROME, MD  ipratropium-albuterol  (DUONEB) 0.5-2.5 (3) MG/3ML SOLN Take 3 mLs by nebulization every 4 (four) hours as needed. Patient not taking: Reported on 04/19/2024 03/04/24   Kasa, Kurian, MD    Scheduled Meds:  arformoterol   15 mcg Nebulization BID   budesonide   0.5 mg Nebulization BID   carvedilol   6.25 mg Oral BID WC   ezetimibe   10 mg Oral Daily   hydroxyurea   1,500 mg Oral Daily   loratadine   10 mg Oral Daily   roflumilast   500 mcg Oral Daily   rosuvastatin   20 mg Oral QHS   sertraline   100 mg Oral Daily   Continuous Infusions: PRN Meds:.acetaminophen  **OR** acetaminophen , ipratropium-albuterol , morphine  injection, ondansetron  **OR** ondansetron  (ZOFRAN ) IV, oxyCODONE , senna-docusate  Consult level: 4   Discussed with Dr. Jerelene via secure chat.   I spent 60 minutes of dedicated to the care of this patient on the date of this encounter to include pre-visit review of records, face-to-face time with the patient discussing conditions above, post visit ordering of testing, clinical documentation with the electronic health record, making appropriate referrals as  documented, and communicating necessary findings to members of the patients care team.   C. Leita Tamea, MD Advanced Bronchoscopy PCCM [facility]    *This note was generated using voice recognition software/Dragon and/or AI transcription program.  Despite best efforts to proofread, errors can occur which can change the meaning. Any transcriptional errors that result from this process are unintentional and may not be fully corrected at the time of dictation.

## 2024-04-21 ENCOUNTER — Inpatient Hospital Stay

## 2024-04-21 DIAGNOSIS — J939 Pneumothorax, unspecified: Secondary | ICD-10-CM | POA: Diagnosis not present

## 2024-04-21 DIAGNOSIS — J9811 Atelectasis: Secondary | ICD-10-CM | POA: Diagnosis not present

## 2024-04-21 DIAGNOSIS — Z4682 Encounter for fitting and adjustment of non-vascular catheter: Secondary | ICD-10-CM | POA: Diagnosis not present

## 2024-04-21 DIAGNOSIS — I700 Atherosclerosis of aorta: Secondary | ICD-10-CM | POA: Diagnosis not present

## 2024-04-21 DIAGNOSIS — S270XXA Traumatic pneumothorax, initial encounter: Secondary | ICD-10-CM | POA: Diagnosis not present

## 2024-04-21 LAB — CBC
HCT: 35.5 % — ABNORMAL LOW (ref 36.0–46.0)
Hemoglobin: 11.5 g/dL — ABNORMAL LOW (ref 12.0–15.0)
MCH: 34.2 pg — ABNORMAL HIGH (ref 26.0–34.0)
MCHC: 32.4 g/dL (ref 30.0–36.0)
MCV: 105.7 fL — ABNORMAL HIGH (ref 80.0–100.0)
Platelets: 523 K/uL — ABNORMAL HIGH (ref 150–400)
RBC: 3.36 MIL/uL — ABNORMAL LOW (ref 3.87–5.11)
RDW: 14.5 % (ref 11.5–15.5)
WBC: 5.7 K/uL (ref 4.0–10.5)
nRBC: 0 % (ref 0.0–0.2)

## 2024-04-21 NOTE — Progress Notes (Signed)
 Progress Note    Nancy Merritt  FMW:[id number] DOB: 1940/07/11  DOA: 04/19/2024 PCP: Rilla Baller, MD      Brief Narrative:    Medical records reviewed and are as summarized below:  Nancy Merritt is a 83 y.o. female with medical history significant of COPD, chronic hypoxic respiratory failure on 2 L of nasal cannula with activity, hypertension, CAD status post CABG, essential thrombocytosis, who presented to the hospital with right-sided rib pain following a fall at home.  She missed a step and fell in her bedroom.  She said if she fell on her right side against the nightstand.   She was found to have right-sided rib fractures with moderate right hydropneumothorax.  Chest tube was placed in the right pleural space by ED physician.  She was admitted to the hospital for further management.      Assessment/Plan:   Principal Problem:   Pneumothorax Active Problems:   COPD with acute exacerbation (HCC)   Acute on chronic respiratory failure with hypoxia (HCC)     Body mass index is 27.61 kg/m.   Traumatic pneumothorax: S/p chest tube placement by ED physician on 04/19/2024. Follow-up chest x-ray shows some improvement in pneumothorax. Case discussed with Dr. Tamea, pulmonologist.  She will place chest tube to waterseal today.  Patient will remain on waterseal overnight if she tolerates this and follow-up chest x-ray looks okay   Right 6th and 7th posterior rib fractures, s/p fall at home: Analgesics as needed for pain.  PT recommended discharge to SNF.   COPD exacerbation: Continue bronchodilators Chronic hypoxic respiratory failure: She uses 2 L oxygen  at home as needed.   Comorbidities include CAD, hypertension, hyperlipidemia, essential thrombocytosis on hydroxyurea .   Resume aspirin  for CAD.  Hold Plavix  for now.   Case discussed with Dr. Tamea, pulmonologist.   Diet Order             Diet regular Room service appropriate? Yes; Fluid  consistency: Thin  Diet effective now                                  Consultants: Pulmonologist  Procedures: None    Medications:    arformoterol   15 mcg Nebulization BID   budesonide   0.5 mg Nebulization BID   carvedilol   6.25 mg Oral BID WC   ezetimibe   10 mg Oral Daily   guaiFENesin   600 mg Oral BID   hydroxyurea   1,500 mg Oral Daily   loratadine   10 mg Oral Daily   revefenacin   175 mcg Nebulization Daily   roflumilast   500 mcg Oral Daily   rosuvastatin   20 mg Oral QHS   sertraline   100 mg Oral Daily   Continuous Infusions:   Anti-infectives (From admission, onward)    None              Family Communication/Anticipated D/C date and plan/Code Status   DVT prophylaxis: SCDs Start: 04/19/24 1532     Code Status: Do not attempt resuscitation (DNR) PRE-ARREST INTERVENTIONS DESIRED  Family Communication: None Disposition Plan: Plan to discharge to resume   Status is: Inpatient Remains inpatient appropriate because: Pneumothorax       Subjective:   Interval events noted.  She complains of cough and pain in the right chest wall.  Objective:    Vitals:   04/20/24 2042 04/21/24 0028 04/21/24 0344 04/21/24 0749  BP: 126/78 132/72 (!) 140/89 136/71  Pulse: 81 77 82 83  Resp: 20 18 20 18   Temp: 98.1 F (36.7 C) 98.3 F (36.8 C) 98.2 F (36.8 C) 97.8 F (36.6 C)  TempSrc:      SpO2: 95% 98% 98% 98%  Weight:       No data found.   Intake/Output Summary (Last 24 hours) at 04/21/2024 1100 Last data filed at 04/21/2024 1033 Gross per 24 hour  Intake 840 ml  Output 25 ml  Net 815 ml   Filed Weights   04/19/24 1014  Weight: 62 kg    Exam:  GEN: NAD, sitting up in the chair SKIN: Warm and dry EYES: No pallor or icterus ENT: MMM CV: RRR PULM: Decreased air entry bilaterally, bilateral expiratory wheezing.  Chest tube in right lateral chest wall ABD: soft, ND, NT, +BS CNS: AAO x 3, non focal EXT: No edema or  tenderness        Data Reviewed:   I have personally reviewed following labs and imaging studies:  Labs: Labs show the following:   Basic Metabolic Panel: Recent Labs  Lab 04/19/24 1205 04/20/24 0437  NA 141 140  K 3.9 3.7  CL 106 104  CO2 26 25  GLUCOSE 134* 118*  BUN 12 13  CREATININE 0.63 0.54  CALCIUM  10.6* 9.9   GFR Estimated Creatinine Clearance: 41.1 mL/min (by C-G formula based on SCr of 0.54 mg/dL). Liver Function Tests: No results for input(s): AST, ALT, ALKPHOS, BILITOT, PROT, ALBUMIN in the last 168 hours. No results for input(s): LIPASE, AMYLASE in the last 168 hours. No results for input(s): AMMONIA in the last 168 hours. Coagulation profile No results for input(s): INR, PROTIME in the last 168 hours.  CBC: Recent Labs  Lab 04/19/24 1205 04/20/24 0437 04/21/24 0508  WBC 6.4 7.2 5.7  HGB 12.2 12.5 11.5*  HCT 37.0 37.7 35.5*  MCV 105.7* 104.7* 105.7*  PLT 500* 524* 523*   Cardiac Enzymes: No results for input(s): CKTOTAL, CKMB, CKMBINDEX, TROPONINI in the last 168 hours. BNP (last 3 results) No results for input(s): PROBNP in the last 8760 hours. CBG: Recent Labs  Lab 04/19/24 2232  GLUCAP 152*   D-Dimer: No results for input(s): DDIMER in the last 72 hours. Hgb A1c: No results for input(s): HGBA1C in the last 72 hours. Lipid Profile: No results for input(s): CHOL, HDL, LDLCALC, TRIG, CHOLHDL, LDLDIRECT in the last 72 hours. Thyroid  function studies: No results for input(s): TSH, T4TOTAL, T3FREE, THYROIDAB in the last 72 hours.  Invalid input(s): FREET3 Anemia work up: No results for input(s): VITAMINB12, FOLATE, FERRITIN, TIBC, IRON, RETICCTPCT in the last 72 hours. Sepsis Labs: Recent Labs  Lab 04/19/24 1205 04/20/24 0437 04/21/24 0508  WBC 6.4 7.2 5.7    Microbiology No results found for this or any previous visit (from the past 240  hours).  Procedures and diagnostic studies:  DG Chest Port 1 View Result Date: 04/21/2024 EXAM: 1 VIEW(S) XRAY OF THE CHEST 04/21/2024 07:07:00 AM COMPARISON: 04/20/2024 CLINICAL HISTORY: Pneumothorax on right FINDINGS: LINES, TUBES AND DEVICES: Right chest tube is stable in position. LUNGS AND PLEURA: Right basilar atelectasis is present. Chronic coarsened interstitial markings are present without pulmonary edema. A trace right apical pneumothorax is present. No pleural effusion. HEART AND MEDIASTINUM: Aortic calcification is present. Status post median sternotomy and coronary artery bypass grafting (CABG). No acute abnormality of the cardiac and mediastinal silhouettes. BONES AND SOFT TISSUES: Left axillary surgical clips are noted. No acute osseous abnormality. IMPRESSION: 1.  Trace right apical pneumothorax. 2. Right basilar atelectasis. 3. Chronic coarsened interstitial markings without pulmonary edema. Electronically signed by: Waddell Calk MD 04/21/2024 07:12 AM EST RP Workstation: [id number]   Portable chest 1 View Result Date: 04/20/2024 EXAM: 1 VIEW XRAY OF THE CHEST 04/20/2024 06:10:00 AM COMPARISON: 04/19/2024 CLINICAL HISTORY: Pneumothorax. FINDINGS: LINES, TUBES AND DEVICES: Right chest tube stable in position. LUNGS AND PLEURA: Linear opacity at right lung base consistent with atelectasis. Trace right apical pneumothorax. No pleural effusion. HEART AND MEDIASTINUM: Aortic arch atherosclerosis. Median sternotomy wires noted. No acute abnormality of the cardiac silhouettes. BONES AND SOFT TISSUES: Surgical clips in left axilla. No acute osseous abnormality. IMPRESSION: 1. Trace right apical pneumothorax. Unchanged from previous exam . 2. Right chest tube stable in position. Electronically signed by: Waddell Calk MD 04/20/2024 06:20 AM EST RP Workstation: [id number]   DG Chest Portable 1 View Result Date: 04/19/2024 EXAM: 1 VIEW XRAY OF THE CHEST 04/19/2024 01:54:00 PM COMPARISON: Chest  radiograph of 12/27/23, compared to chest CT of earlier today. CLINICAL HISTORY: S/P chest tube FINDINGS: LINES, TUBES AND DEVICES: Right sided pleural pigtail catheter placement. LUNGS AND PLEURA: Bibasilar scarring. Suspected residual less than 5 percent right apical pneumothorax. HEART AND MEDIASTINUM: Pacer/defibrillator projects over the left hemithorax. BONES AND SOFT TISSUES: Fracture of the 3rd sternal wire, median sternotomy. IMPRESSION: 1. Right-sided pleural pigtail catheter placement with suspicion of a less than 5% residual right apical pneumothorax. Electronically signed by: Rockey Kilts MD 04/19/2024 02:41 PM EST RP Workstation: [id number]   CT Chest Wo Contrast Result Date: 04/19/2024 CLINICAL DATA:  Chest trauma, fall, pain. EXAM: CT CHEST WITHOUT CONTRAST TECHNIQUE: Multidetector CT imaging of the chest was performed following the standard protocol without IV contrast. RADIATION DOSE REDUCTION: This exam was performed according to the departmental dose-optimization program which includes automated exposure control, adjustment of the mA and/or kV according to patient size and/or use of iterative reconstruction technique. COMPARISON:  05/26/2023. FINDINGS: Cardiovascular: Atherosclerotic calcification of the aorta, aortic valve and coronary arteries. Enlarged pulmonic trunk and heart. No pericardial effusion. Mediastinum/Nodes: No pathologically enlarged mediastinal or axillary lymph nodes. Hilar regions are difficult to definitively evaluate without IV contrast. 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 Air in the esophagus can be seen with dysmotility. Lungs/Pleura: Moderate right pneumothorax with a small right pleural effusion. Centrilobular emphysema. Focal scarring and consolidation in the medial segment right middle lobe. Dependent atelectasis in the right lower lobe. Postinfectious peribronchovascular nodularity in the left upper and left lower lobes. Scattered mucoid impaction  and peribronchial thickening. No pleural fluid. Debris in the airway. Upper Abdomen: Small hiatal hernia. Fluid density mass in the left upper quadrant measures 2.9 x 5.6 cm, as on 05/26/2023 favoring a benign etiology such as a lymphangioma. Visualized portions of the liver, gallbladder, adrenal glands, kidneys, spleen, pancreas, stomach and bowel are otherwise grossly unremarkable. No upper abdominal adenopathy. Musculoskeletal: Degenerative changes in the spine. Osteopenia. There are fractures of the right sixth and seventh posterolateral ribs. IMPRESSION: 1. Right sixth and seventh posterior rib fractures with a moderate right hydropneumothorax. Critical Value/emergent results were called by telephone at the time of interpretation on 04/19/2024 at 11:53 am to provider LAMAR PRICE , who verbally acknowledged these results. 2. Probable scarring and compressive atelectasis in the medial segment right middle lobe. Consider follow-up CT chest without contrast in 4-6 weeks in further evaluation, as malignancy cannot be excluded. 3. Aortic atherosclerosis (ICD10-I70.0). Coronary artery calcification. 4. Enlarged pulmonic trunk, indicative of pulmonary arterial  hypertension. 5.  Emphysema (ICD10-J43.9). Electronically Signed   By: Newell Eke M.D.   On: 04/19/2024 11:53               LOS: 2 days   Samaira Holzworth  [facility]   Pager on [email or web address]. If 7PM-7AM, please contact night-coverage at [email or web address]     04/21/2024, 11:00 AM

## 2024-04-21 NOTE — Progress Notes (Signed)
 Report called and given to 2c nurse. Pt transferred by bed.PT has belongings

## 2024-04-21 NOTE — Progress Notes (Addendum)
 NAME:  Nancy Merritt, MRN:  [id number], DOB:  [birth year], LOS: 2 ADMISSION DATE:  04/19/2024, CONSULTATION DATE: 04/20/2024 REFERRING MD: Laree Ponnala,MD, CHIEF COMPLAINT: Pneumothorax, chest tube management requested.  History of Present Illness:  Patient is an 83 year old former smoker with a history of asthma COPD overlap syndrome, chronic respiratory failure and bronchiectasis who presented on 19 April 2024 after sustaining a fall at home striking the right side of her chest on her nightstand.  She did not have loss of consciousness.  She states she missed a step in her bedroom as the cause of the fall.  She had persistent rib cage pain so she presented to the emergency room.  A chest CT performed showed a moderate right pneumothorax, small right pleural effusion, emphysema and fractures of the right 6th and 7th posterolateral ribs.  She had a chest tube inserted in the emergency room.  We are asked to of chest tube management.  The patient has noted some increased congestion and cough which preceded the aforementioned trauma.  She has not had any fevers or chills.  Currently she is having difficulty bringing up secretions due to chest pain when coughing due to her rib fractures.  She does not endorse any fevers, chills or sweats.  Since the chest tube was placed she feels her dyspnea is at baseline.  Significant Hospital Events: Including procedures, antibiotic start and stop dates in addition to other pertinent events   04/19/2024 right traumatic pneumothorax, chest tube placed 04/20/2024 PCCM consulted 04/21/2024 chest tube placed to waterseal  Interim History / Subjective:  Reports shortness of breath at baseline.  Pain from chest tube better controlled.  Chest feels less congested today.  Doing well with nebulizers.  Objective    Blood pressure 136/71, pulse 83, temperature 97.8 F (36.6 C), resp. rate 18, weight 62 kg, SpO2 98%.    SpO2: 98 % O2 Flow Rate (L/min): 2  L/min FiO2 (%): 36 % FiO2 (%):  [36 %] 36 %   Intake/Output Summary (Last 24 hours) at 04/21/2024 1007 Last data filed at 04/21/2024 0400 Gross per 24 hour  Intake 720 ml  Output 25 ml  Net 695 ml   Filed Weights   04/19/24 1014  Weight: 62 kg    Examination: General: Frail appearing elderly woman, sitting up in chair.  Chronic use of accessory use of respiration.  No conversational dyspnea.  Awake and alert. HEENT: Poor dentition, chipped teeth.  Oral mucosa moist.  No thrush. Lungs: Few rhonchi throughout.  No wheezes.  Right pigtail 14 French chest tube in place.   Cardiovascular: Regular rate and rhythm, no rubs murmurs gallops heard. Abdomen: Benign. Extremities: Trace edema bilaterally.  No joint deformities. Neuro: Grossly intact. GU: Pure wick in place.  Chest tube: NO leak noted, chest tube placed to waterseal.  Imaging   Representative image from CT performed 19 April 2024 showing pneumothorax on the right:   Chest x-ray today 12/03 showing chest tube in place with trace right apical pneumothorax, no change from yesterday:   Assessment and Plan  Acute on chronic respiratory failure due to traumatic right pneumothorax Hx: COPD/asthma/bronchiectasis No persistent leak on chest tube today Place chest tube to waterseal Check chest x-ray 2 hours after waterseal sure pneumothorax not expanding  If tolerates waterseal, continue overnight Reassess with chest x-ray in the a.m. if no reaccumulation of pneumothorax DC chest tube Continue supplemental O2 to maintain oxygen  saturations at 90% or better  COPD exacerbation due to the above  Optimize nebulization treatments Added Yupelri to therapy yesterday, notes benefit Acapella flutter valve for mucociliary clearance   Labs   CBC: Recent Labs  Lab 04/19/24 1205 04/20/24 0437 04/21/24 0508  WBC 6.4 7.2 5.7  HGB 12.2 12.5 11.5*  HCT 37.0 37.7 35.5*  MCV 105.7* 104.7* 105.7*  PLT 500* 524* 523*    Basic  Metabolic Panel: Recent Labs  Lab 04/19/24 1205 04/20/24 0437  NA 141 140  K 3.9 3.7  CL 106 104  CO2 26 25  GLUCOSE 134* 118*  BUN 12 13  CREATININE 0.63 0.54  CALCIUM  10.6* 9.9   GFR: Estimated Creatinine Clearance: 41.1 mL/min (by C-G formula based on SCr of 0.54 mg/dL). Recent Labs  Lab 04/19/24 1205 04/20/24 0437 04/21/24 0508  WBC 6.4 7.2 5.7    Liver Function Tests: No results for input(s): AST, ALT, ALKPHOS, BILITOT, PROT, ALBUMIN in the last 168 hours. No results for input(s): LIPASE, AMYLASE in the last 168 hours. No results for input(s): AMMONIA in the last 168 hours.  ABG    Component Value Date/Time   HCO3 24.4 05/08/2019 1743   TCO2 26 05/08/2019 1743   O2SAT 92.0 05/08/2019 1743     Coagulation Profile: No results for input(s): INR, PROTIME in the last 168 hours.  Cardiac Enzymes: No results for input(s): CKTOTAL, CKMB, CKMBINDEX, TROPONINI in the last 168 hours.  HbA1C: Hgb A1c MFr Bld  Date/Time Value Ref Range Status  08/08/2023 10:41 AM 5.9 4.6 - 6.5 % Final    Comment:    Glycemic Control Guidelines for People with Diabetes:Non Diabetic:  <6%Goal of Therapy: <7%Additional Action Suggested:  >8%   08/13/2022 01:38 PM 6.5 4.6 - 6.5 % Final    Comment:    Glycemic Control Guidelines for People with Diabetes:Non Diabetic:  <6%Goal of Therapy: <7%Additional Action Suggested:  >8%     CBG: Recent Labs  Lab 04/19/24 2232  GLUCAP 152*    Review of Systems:   A 10 point review of systems was performed and it is as noted above otherwise negative.  Past Medical History:  She,  has a past medical history of Actinic keratosis, Arthritis, Asthma, Basal cell carcinoma (08/18/2023), Cancer (HCC) (1990), CHF (congestive heart failure) (HCC), COPD (chronic obstructive pulmonary disease) (HCC), Coronary artery disease, COVID-19 (06/14/2020), Depression, breast cancer (04/07/2018), and Hypertension.   Surgical History:    Past Surgical History:  Procedure Laterality Date   BREAST SURGERY Left 1990   CORONARY ARTERY BYPASS GRAFT     JOINT REPLACEMENT Right    Hip   LEFT HEART CATH AND CORS/GRAFTS ANGIOGRAPHY N/A 06/05/2022   Procedure: LEFT HEART CATH AND CORS/GRAFTS ANGIOGRAPHY;  Surgeon: Darron Deatrice LABOR, MD;  Location: [facility];  Service: Cardiovascular;  Laterality: N/A;   OVARIAN CYST REMOVAL  1970   TUBAL LIGATION  1970     Social History:   reports that she quit smoking about 23 years ago. Her smoking use included cigarettes. She started smoking about 63 years ago. She has a 40 pack-year smoking history. She has never used smokeless tobacco. She reports that she does not currently use alcohol. She reports that she does not use drugs.   Family History:  Her family history includes Alcohol abuse in her brother, daughter, father, and maternal grandfather; Arthritis in her brother, daughter, daughter, daughter, father, maternal grandfather, maternal grandmother, mother, sister, and sister; Asthma in her daughter; Breast cancer (age of onset: 8) in her sister; COPD in her daughter,  daughter, father, mother, and sister; Cancer in her father, maternal grandfather, and sister; Depression in her daughter, daughter, daughter, and maternal grandfather; Diabetes in her father and mother; Drug abuse in her sister; Hearing loss in her daughter, maternal grandfather, and mother; Heart attack (age of onset: 33) in her father; Hypertension in her brother, daughter, daughter, daughter, father, maternal grandfather, maternal grandmother, mother, and sister; Miscarriages / Stillbirths in her daughter, mother, and sister; Stroke in her maternal grandmother and mother; Thyroid  disease in her daughter, daughter, daughter, maternal grandmother, sister, and sister.   Allergies Allergies  Allergen Reactions   Advair Diskus [Fluticasone -Salmeterol] Shortness Of Breath    As of 09/01/2014. Pt can take Breo   Bee Pollen  Other (See Comments)   Budesonide -Formoterol Fumarate Other (See Comments)    Hard time breathing as of 10/31/2014.   Levofloxacin Other (See Comments)    Nausea, brain fog, depression, as of 12/12/14   Pollen Extract Cough   Qvar [Beclomethasone] Other (See Comments)    Lungs closing-as of 05/03/2015   Singulair [Montelukast] Shortness Of Breath     Home Medications  Prior to Admission medications   Medication Sig Start Date End Date Taking? Authorizing Provider  albuterol  (VENTOLIN  HFA) 108 (90 Base) MCG/ACT inhaler INHALE 2 PUFFS INTO THE LUNGS EVERY 4 HOURS AS NEEDED FOR WHEEZING OR SHORTNESS OF BREATH. 11/04/23  Yes Rilla Baller, MD  arformoterol  (BROVANA ) 15 MCG/2ML NEBU Take 2 mLs (15 mcg total) by nebulization 2 (two) times daily. 10/15/23  Yes Kasa, Kurian, MD  aspirin  81 MG chewable tablet Chew 1 tablet (81 mg total) by mouth daily. 06/07/22  Yes Meng, Hao, PA  budesonide  (PULMICORT ) 0.5 MG/2ML nebulizer solution Take 2 mLs (0.5 mg total) by nebulization 2 (two) times daily. 10/15/23 10/14/24 Yes Kasa, Kurian, MD  carvedilol  (COREG ) 6.25 MG tablet TAKE 1 TABLET BY MOUTH 2 TIMES DAILY WITH A MEAL. 09/29/23  Yes Meng, Hao, PA  Cholecalciferol  (VITAMIN D3) 25 MCG (1000 UT) capsule Take 2 capsules (2,000 Units total) by mouth daily. 08/15/22  Yes Rilla Baller, MD  clopidogrel  (PLAVIX ) 75 MG tablet Take 1 tablet (75 mg total) by mouth daily with breakfast. 10/17/23  Yes Okey Vina GAILS, MD  ezetimibe  (ZETIA ) 10 MG tablet TAKE 1 TABLET BY MOUTH EVERY DAY 06/17/23  Yes Meng, Hao, PA  fluticasone  (FLONASE ) 50 MCG/ACT nasal spray Place 2 sprays into both nostrils daily. 08/15/23  Yes Rilla Baller, MD  hydroxyurea  (HYDREA ) 500 MG capsule Take 3 capsules (1,500 mg total) by mouth daily. May take with food to minimize GI side effects. 06/19/23  Yes Jacobo Evalene PARAS, MD  loratadine  (CLARITIN ) 10 MG tablet Take 10 mg by mouth daily.   Yes [provider]  Multiple Vitamin  (MULTIVITAMIN) tablet Take 1 tablet by mouth daily.   Yes [provider]  Multiple Vitamins-Minerals (PRESERVISION AREDS 2) CAPS Take 2 each by mouth daily at 12 noon.   Yes [provider]  nitroGLYCERIN  (NITROSTAT ) 0.4 MG SL tablet Place 1 tablet (0.4 mg total) under the tongue every 5 (five) minutes as needed for chest pain. 06/06/22  Yes Meng, Hao, PA  roflumilast  (DALIRESP ) 500 MCG TABS tablet TAKE 1 TABLET BY MOUTH DAILY 02/05/24  Yes Kasa, Kurian, MD  rosuvastatin  (CRESTOR ) 20 MG tablet Take 1 tablet (20 mg total) by mouth at bedtime. 06/06/22  Yes Meng, Hao, PA  sacubitril -valsartan  (ENTRESTO ) 24-26 MG Take 2 tablets by mouth 2 (two) times daily. Patient taking differently: Take 1 tablet  by mouth 2 (two) times daily. 06/24/23  Yes Okey Vina GAILS, MD  sertraline  (ZOLOFT ) 100 MG tablet Take 1 tablet (100 mg total) by mouth daily. 08/15/23  Yes Rilla Baller, MD  acetaminophen  (TYLENOL ) 500 MG tablet Take 1 tablet (500 mg total) by mouth 3 (three) times daily as needed for moderate pain. 06/11/22   Rilla Baller, MD  doxycycline  (VIBRA -TABS) 100 MG tablet Take 1 tablet (100 mg total) by mouth 2 (two) times daily. Patient not taking: Reported on 04/19/2024 12/10/23   Tamea Dedra CROME, MD  ipratropium-albuterol  (DUONEB) 0.5-2.5 (3) MG/3ML SOLN Take 3 mLs by nebulization every 4 (four) hours as needed. Patient not taking: Reported on 04/19/2024 03/04/24   Kasa, Kurian, MD    Scheduled Meds:  arformoterol   15 mcg Nebulization BID   budesonide   0.5 mg Nebulization BID   carvedilol   6.25 mg Oral BID WC   ezetimibe   10 mg Oral Daily   guaiFENesin   600 mg Oral BID   hydroxyurea   1,500 mg Oral Daily   loratadine   10 mg Oral Daily   revefenacin   175 mcg Nebulization Daily   roflumilast   500 mcg Oral Daily   rosuvastatin   20 mg Oral QHS   sertraline   100 mg Oral Daily   Continuous Infusions: PRN Meds:.acetaminophen  **OR** acetaminophen , albuterol , morphine  injection,  ondansetron  **OR** ondansetron  (ZOFRAN ) IV, oxyCODONE , senna-docusate  Level 2 follow-up   Discussed with Dr. Jens via secure chat.   I spent 35 minutes of dedicated to the care of this patient on the date of this encounter to include pre-visit review of records, face-to-face time with the patient discussing conditions above, post visit ordering of testing, clinical documentation with the electronic health record, making appropriate referrals as documented, and communicating necessary findings to members of the patients care team.   C. Leita Tamea, MD Advanced Bronchoscopy PCCM [facility]    *This note was generated using voice recognition software/Dragon and/or AI transcription program.  Despite best efforts to proofread, errors can occur which can change the meaning. Any transcriptional errors that result from this process are unintentional and may not be fully corrected at the time of dictation.

## 2024-04-21 NOTE — Plan of Care (Signed)
   Problem: Education: Goal: Knowledge of General Education information will improve Description: Including pain rating scale, medication(s)/side effects and non-pharmacologic comfort measures Outcome: Progressing   Problem: Clinical Measurements: Goal: Ability to maintain clinical measurements within normal limits will improve Outcome: Progressing Goal: Will remain free from infection Outcome: Progressing

## 2024-04-21 NOTE — Plan of Care (Signed)

## 2024-04-22 ENCOUNTER — Inpatient Hospital Stay

## 2024-04-22 DIAGNOSIS — S270XXA Traumatic pneumothorax, initial encounter: Secondary | ICD-10-CM | POA: Diagnosis not present

## 2024-04-22 DIAGNOSIS — Z4682 Encounter for fitting and adjustment of non-vascular catheter: Secondary | ICD-10-CM | POA: Diagnosis not present

## 2024-04-22 DIAGNOSIS — J939 Pneumothorax, unspecified: Secondary | ICD-10-CM | POA: Diagnosis not present

## 2024-04-22 DIAGNOSIS — I517 Cardiomegaly: Secondary | ICD-10-CM | POA: Diagnosis not present

## 2024-04-22 MED ORDER — PREDNISONE 20 MG PO TABS
20.0000 mg | ORAL_TABLET | Freq: Every day | ORAL | Status: DC
  Administered 2024-04-22 – 2024-04-23 (×2): 20 mg via ORAL
  Filled 2024-04-22 (×2): qty 1

## 2024-04-22 MED ORDER — AZITHROMYCIN 250 MG PO TABS
500.0000 mg | ORAL_TABLET | Freq: Every day | ORAL | Status: DC
  Administered 2024-04-22 – 2024-04-23 (×2): 500 mg via ORAL
  Filled 2024-04-22 (×2): qty 2

## 2024-04-22 MED ORDER — CLOPIDOGREL BISULFATE 75 MG PO TABS
75.0000 mg | ORAL_TABLET | Freq: Every day | ORAL | Status: DC
  Administered 2024-04-22 – 2024-04-23 (×2): 75 mg via ORAL
  Filled 2024-04-22 (×2): qty 1

## 2024-04-22 NOTE — Care Management Important Message (Signed)
 Important Message  Patient Details  Name: Nancy Merritt MRN: [id number] Date of Birth: 1940/08/14   Important Message Given:  Yes - Medicare IM     Genie Wenke W, CMA 04/22/2024, 11:04 AM

## 2024-04-22 NOTE — Procedures (Signed)
 Procedure: Chest tube removal.  Indication: Traumatic right pneumothorax, resolved, chest tube no longer needed  Description of procedure:   Chest tube was to waterseal, chest x-ray reviewed previously showed no pneumothorax.  There was no noted from the chest tube.  Patient was placed on Semi-Fowler's position.  The right chest was exposed and chest tube dressing was removed.  Sutures anchoring the chest tube were removed.  Occlusive dressing was prepared.  Patient was asked to make inspiratory effort and hold breath.  Chest tube was removed without difficulty.  Occlusive dressing and sterile dressing applied.  Patient tolerated the procedure well.  Impression: Right traumatic pneumothorax, resolved Status post removal of chest 14 French tube without sequela   C. [name], MD Advanced Bronchoscopy PCCM [facility]

## 2024-04-22 NOTE — Progress Notes (Addendum)
 Physical Therapy Treatment Patient Details Name: Nancy Merritt MRN: [id number] DOB: Dec 11, 1940 Today's Date: 04/22/2024   History of Present Illness Patient is a 83 year old female with fall and right side rib pain. CT scan of the chest revealed right sided rib fractures with a moderate  right hydropneumothorax. 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    PT Comments  Pt was short sitting in recliner upon arrival. On 2 L o2 with sao2 98%. Sao2 maintained > 97% throughout session on 2L. Pt requested to go to BR. MD cleared for participation with PT with chest tube waterseal versus suction. Pt is A and O x 4. Wants to Dc  directly home at DC. Pt was able to stand, ambulate, and tolerate activity well. Increased wheezing noted but vitals remained stable. Confirmed available assist at DC at home. DC recs updated to home with HHPT. Acute PT will continue to follow and progress per current POC. Chest tube scheduled to be removed today if x ray clear.    If plan is discharge home, recommend the following: A little help with walking and/or transfers;A little help with bathing/dressing/bathroom;Assistance with cooking/housework;Help with stairs or ramp for entrance     Equipment Recommendations  None recommended by PT       Precautions / Restrictions Precautions Precautions: Fall Recall of Precautions/Restrictions: Intact Restrictions Weight Bearing Restrictions Per Provider Order: No     Mobility  Bed Mobility  General bed mobility comments: Pt was in recliner pre/post session    Transfers Overall transfer level: Needs assistance Equipment used: Rolling walker (2 wheels) Transfers: Sit to/from Stand Sit to Stand: Supervision  General transfer comment: Pt was able to stand from recliner > RW without physical assistance. ON 2 L o2 thorughout session with sao2 >97%. MD cleared pt to have chest tube placed on waterseal  versus suction.    Ambulation/Gait Ambulation/Gait assistance: Contact guard assist, Supervision Gait Distance (Feet): 40 Feet Assistive device: Rolling walker (2 wheels) Gait Pattern/deviations: Step-through pattern Gait velocity: decreased  General Gait Details: Pt was able to ambulate with RW ~ 40 ft with CGA -supervision. no LOB. distance limited by fatigue however vitals remained stable on 2 L    Balance Overall balance assessment: Needs assistance Sitting-balance support: Feet supported Sitting balance-Leahy Scale: Good     Standing balance support: Bilateral upper extremity supported, During functional activity, Reliant on assistive device for balance Standing balance-Leahy Scale: Fair     Hotel Manager: No apparent difficulties  Cognition Arousal: Alert Behavior During Therapy: WFL for tasks assessed/performed   PT - Cognitive impairments: No apparent impairments    PT - Cognition Comments: Pt is A and O x 4 Following commands: Intact      Cueing Cueing Techniques: Verbal cues, Tactile cues         Pertinent Vitals/Pain Pain Assessment Pain Assessment: 0-10 Pain Score: 5  Pain Location: right flank Pain Descriptors / Indicators: Discomfort Pain Intervention(s): Limited activity within patient's tolerance, Monitored during session, Premedicated before session, Repositioned     PT Goals (current goals can now be found in the care plan section) Acute Rehab PT Goals Patient Stated Goal: to go home Progress towards PT goals: Progressing toward goals    Frequency    Min 2X/week       AM-PAC PT 6 Clicks Mobility   Outcome Measure  Help needed turning from your back to your side while in  a flat bed without using bedrails?: A Little Help needed moving from lying on your back to sitting on the side of a flat bed without using bedrails?: A Little Help needed moving to and from a bed to a chair (including a wheelchair)?: A  Little Help needed standing up from a chair using your arms (e.g., wheelchair or bedside chair)?: A Little Help needed to walk in hospital room?: A Little Help needed climbing 3-5 steps with a railing? : A Lot 6 Click Score: 17    End of Session Equipment Utilized During Treatment: Oxygen  (2L O2 [place]) Activity Tolerance: Patient tolerated treatment well;Patient limited by fatigue Patient left: in chair;with call bell/phone within reach;with chair alarm set Nurse Communication: Mobility status PT Visit Diagnosis: Muscle weakness (generalized) (M62.81);Unsteadiness on feet (R26.81)     Time: 9177-9154 PT Time Calculation (min) (ACUTE ONLY): 23 min  Charges:    $Gait Training: 8-22 mins $Therapeutic Activity: 8-22 mins PT General Charges $$ ACUTE PT VISIT: 1 Visit                    Rankin Essex PTA 04/22/24, 2:24 PM

## 2024-04-22 NOTE — Progress Notes (Signed)
 NAME:  Nancy Merritt, MRN:  [id number], DOB:  1940-12-20, LOS: 3 ADMISSION DATE:  04/19/2024, CONSULTATION DATE: 04/20/2024 REFERRING MD: Laree Ponnala,MD, CHIEF COMPLAINT: Pneumothorax, chest tube management requested.  History of Present Illness:  Patient is an 83 year old former smoker with a history of asthma COPD overlap syndrome, chronic respiratory failure and bronchiectasis who presented on 19 April 2024 after sustaining a fall at home striking the right side of her chest on her nightstand.  She did not have loss of consciousness.  She states she missed a step in her bedroom as the cause of the fall.  She had persistent rib cage pain so she presented to the emergency room.  A chest CT performed showed a moderate right pneumothorax, small right pleural effusion, emphysema and fractures of the right 6th and 7th posterolateral ribs.  She had a chest tube inserted in the emergency room.  We are asked to of chest tube management.  The patient has noted some increased congestion and cough which preceded the aforementioned trauma.  She has not had any fevers or chills.  Currently she is having difficulty bringing up secretions due to chest pain when coughing due to her rib fractures.  She does not endorse any fevers, chills or sweats.  Since the chest tube was placed she feels her dyspnea is at baseline.  Significant Hospital Events: Including procedures, antibiotic start and stop dates in addition to other pertinent events   04/19/2024 right traumatic pneumothorax, chest tube placed 04/20/2024 PCCM consulted 04/21/2024 chest tube placed to waterseal 04/22/2024 chest tube removed  Interim History / Subjective:  Episode of shortness of breath this morning with some pleuritic pain.  Still with congested cough Doing well with nebulizers.  Oxygen  supplementation down to 2 L/min which is baseline for the patient  Objective    Blood pressure (!) 165/66, pulse 86, temperature 98.7 F (37.1  C), temperature source Oral, resp. rate 20, weight 62 kg, SpO2 96%.    SpO2: 96 % O2 Flow Rate (L/min): 2 L/min FiO2 (%): 28 % FiO2 (%):  [28 %] 28 %   Intake/Output Summary (Last 24 hours) at 04/22/2024 1301 Last data filed at 04/21/2024 1710 Gross per 24 hour  Intake 240 ml  Output 0 ml  Net 240 ml   Filed Weights   04/19/24 1014  Weight: 62 kg    Examination: General: Frail appearing elderly woman, laying down in bed comfortably.  Chronic use of accessories of respiration.  No conversational dyspnea.  Awake and alert. HEENT: Poor dentition, chipped teeth.  Oral mucosa moist.  No thrush. Lungs: Scattered rhonchi throughout.  Few wheezes.  Right pigtail 14 French chest tube in place.   Cardiovascular: Regular rate and rhythm, no rubs murmurs gallops heard. Abdomen: Benign. Extremities: Trace edema bilaterally.  No joint deformities. Neuro: Grossly intact. GU: Pure wick in place.  Chest tube: NO leak noted, chest tube to waterseal.  Imaging  Chest x-ray today 12/04 showing chest tube in place with no pneumothorax evident, pneumothorax resolved:   Assessment and Plan  Acute on chronic respiratory failure due to traumatic right pneumothorax Hx: COPD/asthma/bronchiectasis No leak on chest tube today, chest tube is to waterseal No pneumothorax on chest x-ray Plan to remove chest tube today Reassess with chest x-ray in the a.m.  Continue supplemental O2 to maintain oxygen  saturations at 90% or better  COPD exacerbation due to the above Optimize nebulization treatments Added Yupelri to therapy, notes benefit Acapella flutter valve for mucociliary clearance Prednisone  20  mg daily x 5 days Azithromycin  250 mg 2 tablets x 1 today then 1 tablet daily x 5 days   Labs   CBC: Recent Labs  Lab 04/19/24 1205 04/20/24 0437 04/21/24 0508  WBC 6.4 7.2 5.7  HGB 12.2 12.5 11.5*  HCT 37.0 37.7 35.5*  MCV 105.7* 104.7* 105.7*  PLT 500* 524* 523*    Basic Metabolic  Panel: Recent Labs  Lab 04/19/24 1205 04/20/24 0437  NA 141 140  K 3.9 3.7  CL 106 104  CO2 26 25  GLUCOSE 134* 118*  BUN 12 13  CREATININE 0.63 0.54  CALCIUM  10.6* 9.9   GFR: Estimated Creatinine Clearance: 41.1 mL/min (by C-G formula based on SCr of 0.54 mg/dL). Recent Labs  Lab 04/19/24 1205 04/20/24 0437 04/21/24 0508  WBC 6.4 7.2 5.7    Liver Function Tests: No results for input(s): AST, ALT, ALKPHOS, BILITOT, PROT, ALBUMIN in the last 168 hours. No results for input(s): LIPASE, AMYLASE in the last 168 hours. No results for input(s): AMMONIA in the last 168 hours.  ABG    Component Value Date/Time   HCO3 24.4 05/08/2019 1743   TCO2 26 05/08/2019 1743   O2SAT 92.0 05/08/2019 1743     Coagulation Profile: No results for input(s): INR, PROTIME in the last 168 hours.  Cardiac Enzymes: No results for input(s): CKTOTAL, CKMB, CKMBINDEX, TROPONINI in the last 168 hours.  HbA1C: Hgb A1c MFr Bld  Date/Time Value Ref Range Status  08/08/2023 10:41 AM 5.9 4.6 - 6.5 % Final    Comment:    Glycemic Control Guidelines for People with Diabetes:Non Diabetic:  <6%Goal of Therapy: <7%Additional Action Suggested:  >8%   08/13/2022 01:38 PM 6.5 4.6 - 6.5 % Final    Comment:    Glycemic Control Guidelines for People with Diabetes:Non Diabetic:  <6%Goal of Therapy: <7%Additional Action Suggested:  >8%     CBG: Recent Labs  Lab 04/19/24 2232  GLUCAP 152*    Review of Systems:   A 10 point review of systems was performed and it is as noted above otherwise negative.  Past Medical History:  She,  has a past medical history of Actinic keratosis, Arthritis, Asthma, Basal cell carcinoma (08/18/2023), Cancer (HCC) (1990), CHF (congestive heart failure) (HCC), COPD (chronic obstructive pulmonary disease) (HCC), Coronary artery disease, COVID-19 (06/14/2020), Depression, breast cancer (04/07/2018), and Hypertension.   Surgical History:   Past  Surgical History:  Procedure Laterality Date   BREAST SURGERY Left 1990   CORONARY ARTERY BYPASS GRAFT     JOINT REPLACEMENT Right    Hip   LEFT HEART CATH AND CORS/GRAFTS ANGIOGRAPHY N/A 06/05/2022   Procedure: LEFT HEART CATH AND CORS/GRAFTS ANGIOGRAPHY;  Surgeon: Darron Deatrice LABOR, MD;  Location: [facility];  Service: Cardiovascular;  Laterality: N/A;   OVARIAN CYST REMOVAL  1970   TUBAL LIGATION  1970     Social History:   reports that she quit smoking about 23 years ago. Her smoking use included cigarettes. She started smoking about 63 years ago. She has a 40 pack-year smoking history. She has never used smokeless tobacco. She reports that she does not currently use alcohol. She reports that she does not use drugs.   Family History:  Her family history includes Alcohol abuse in her brother, daughter, father, and maternal grandfather; Arthritis in her brother, daughter, daughter, daughter, father, maternal grandfather, maternal grandmother, mother, sister, and sister; Asthma in her daughter; Breast cancer (age of onset: 28) in her sister;  COPD in her daughter, daughter, father, mother, and sister; Cancer in her father, maternal grandfather, and sister; Depression in her daughter, daughter, daughter, and maternal grandfather; Diabetes in her father and mother; Drug abuse in her sister; Hearing loss in her daughter, maternal grandfather, and mother; Heart attack (age of onset: 27) in her father; Hypertension in her brother, daughter, daughter, daughter, father, maternal grandfather, maternal grandmother, mother, and sister; Miscarriages / Stillbirths in her daughter, mother, and sister; Stroke in her maternal grandmother and mother; Thyroid  disease in her daughter, daughter, daughter, maternal grandmother, sister, and sister.   Allergies Allergies  Allergen Reactions   Advair Diskus [Fluticasone -Salmeterol] Shortness Of Breath    As of 09/01/2014. Pt can take Breo   Bee Pollen Other  (See Comments)   Budesonide -Formoterol Fumarate Other (See Comments)    Hard time breathing as of 10/31/2014.   Levofloxacin Other (See Comments)    Nausea, brain fog, depression, as of 12/12/14   Pollen Extract Cough   Qvar [Beclomethasone] Other (See Comments)    Lungs closing-as of 05/03/2015   Singulair [Montelukast] Shortness Of Breath     Home Medications  Prior to Admission medications   Medication Sig Start Date End Date Taking? Authorizing Provider  albuterol  (VENTOLIN  HFA) 108 (90 Base) MCG/ACT inhaler INHALE 2 PUFFS INTO THE LUNGS EVERY 4 HOURS AS NEEDED FOR WHEEZING OR SHORTNESS OF BREATH. 11/04/23  Yes Rilla Baller, MD  arformoterol  (BROVANA ) 15 MCG/2ML NEBU Take 2 mLs (15 mcg total) by nebulization 2 (two) times daily. 10/15/23  Yes Kasa, Kurian, MD  aspirin  81 MG chewable tablet Chew 1 tablet (81 mg total) by mouth daily. 06/07/22  Yes Meng, Hao, PA  budesonide  (PULMICORT ) 0.5 MG/2ML nebulizer solution Take 2 mLs (0.5 mg total) by nebulization 2 (two) times daily. 10/15/23 10/14/24 Yes Kasa, Kurian, MD  carvedilol  (COREG ) 6.25 MG tablet TAKE 1 TABLET BY MOUTH 2 TIMES DAILY WITH A MEAL. 09/29/23  Yes Meng, Hao, PA  Cholecalciferol  (VITAMIN D3) 25 MCG (1000 UT) capsule Take 2 capsules (2,000 Units total) by mouth daily. 08/15/22  Yes Rilla Baller, MD  clopidogrel  (PLAVIX ) 75 MG tablet Take 1 tablet (75 mg total) by mouth daily with breakfast. 10/17/23  Yes Okey Vina GAILS, MD  ezetimibe  (ZETIA ) 10 MG tablet TAKE 1 TABLET BY MOUTH EVERY DAY 06/17/23  Yes Meng, Hao, PA  fluticasone  (FLONASE ) 50 MCG/ACT nasal spray Place 2 sprays into both nostrils daily. 08/15/23  Yes Rilla Baller, MD  hydroxyurea  (HYDREA ) 500 MG capsule Take 3 capsules (1,500 mg total) by mouth daily. May take with food to minimize GI side effects. 06/19/23  Yes Jacobo Evalene PARAS, MD  loratadine  (CLARITIN ) 10 MG tablet Take 10 mg by mouth daily.   Yes [provider]  Multiple Vitamin (MULTIVITAMIN)  tablet Take 1 tablet by mouth daily.   Yes [provider]  Multiple Vitamins-Minerals (PRESERVISION AREDS 2) CAPS Take 2 each by mouth daily at 12 noon.   Yes [provider]  nitroGLYCERIN  (NITROSTAT ) 0.4 MG SL tablet Place 1 tablet (0.4 mg total) under the tongue every 5 (five) minutes as needed for chest pain. 06/06/22  Yes Meng, Hao, PA  roflumilast  (DALIRESP ) 500 MCG TABS tablet TAKE 1 TABLET BY MOUTH DAILY 02/05/24  Yes Kasa, Kurian, MD  rosuvastatin  (CRESTOR ) 20 MG tablet Take 1 tablet (20 mg total) by mouth at bedtime. 06/06/22  Yes Meng, Hao, PA  sacubitril -valsartan  (ENTRESTO ) 24-26 MG Take 2 tablets by mouth 2 (two) times daily. Patient taking  differently: Take 1 tablet by mouth 2 (two) times daily. 06/24/23  Yes Okey Vina GAILS, MD  sertraline  (ZOLOFT ) 100 MG tablet Take 1 tablet (100 mg total) by mouth daily. 08/15/23  Yes Rilla Baller, MD  acetaminophen  (TYLENOL ) 500 MG tablet Take 1 tablet (500 mg total) by mouth 3 (three) times daily as needed for moderate pain. 06/11/22   Rilla Baller, MD  doxycycline  (VIBRA -TABS) 100 MG tablet Take 1 tablet (100 mg total) by mouth 2 (two) times daily. Patient not taking: Reported on 04/19/2024 12/10/23   Tamea Dedra CROME, MD  ipratropium-albuterol  (DUONEB) 0.5-2.5 (3) MG/3ML SOLN Take 3 mLs by nebulization every 4 (four) hours as needed. Patient not taking: Reported on 04/19/2024 03/04/24   Kasa, Kurian, MD    Scheduled Meds:  arformoterol   15 mcg Nebulization BID   budesonide   0.5 mg Nebulization BID   carvedilol   6.25 mg Oral BID WC   ezetimibe   10 mg Oral Daily   guaiFENesin   600 mg Oral BID   hydroxyurea   1,500 mg Oral Daily   loratadine   10 mg Oral Daily   predniSONE   20 mg Oral Q breakfast   revefenacin  175 mcg Nebulization Daily   roflumilast   500 mcg Oral Daily   rosuvastatin   20 mg Oral QHS   sertraline   100 mg Oral Daily   Continuous Infusions: PRN Meds:.acetaminophen  **OR** acetaminophen , albuterol ,  morphine  injection, ondansetron  **OR** ondansetron  (ZOFRAN ) IV, oxyCODONE, senna-docusate  Level 2 follow-up   Discussed with Dr. Jens in person.   I spent 35 minutes of dedicated to the care of this patient on the date of this encounter to include pre-visit review of records, face-to-face time with the patient discussing conditions above, post visit ordering of testing, clinical documentation with the electronic health record, making appropriate referrals as documented, and communicating necessary findings to members of the patients care team.   C. Leita Tamea, MD Advanced Bronchoscopy PCCM [facility]    *This note was generated using voice recognition software/Dragon and/or AI transcription program.  Despite best efforts to proofread, errors can occur which can change the meaning. Any transcriptional errors that result from this process are unintentional and may not be fully corrected at the time of dictation.

## 2024-04-22 NOTE — Progress Notes (Signed)
 Progress Note    Nancy Merritt  FMW:[id number] DOB: 1940-10-31  DOA: 04/19/2024 PCP: Rilla Baller, MD      Brief Narrative:    Medical records reviewed and are as summarized below:  Nancy Merritt is a 83 y.o. female with medical history significant of COPD, chronic hypoxic respiratory failure on 2 L of nasal cannula with activity, hypertension, CAD status post CABG, essential thrombocytosis, who presented to the hospital with right-sided rib pain following a fall at home.  She missed a step and fell in her bedroom.  She said if she fell on her right side against the nightstand.   She was found to have right-sided rib fractures with moderate right hydropneumothorax.  Chest tube was placed in the right pleural space by ED physician.  She was admitted to the hospital for further management.      Assessment/Plan:   Principal Problem:   Pneumothorax Active Problems:   COPD with acute exacerbation (HCC)   Acute on chronic respiratory failure with hypoxia (HCC)     Body mass index is 27.61 kg/m.   Traumatic pneumothorax: S/p chest tube placement by ED physician on 04/19/2024. Repeat chest x-ray on 04/22/2024 did not show any evidence of residual right pneumothorax. Right chest wall tube was removed by Dr. Tamea, pulmonologist. Patient had chest pain prior to removal of chest tube.  However, chest pain resolved after removal of chest tube.   Right 6th and 7th posterior rib fractures, s/p fall at home: Analgesics as needed for pain.  PT recommended discharge to SNF.   COPD exacerbation: Continue bronchodilators.  Add prednisone  and azithromycin .   Chronic hypoxic respiratory failure: She uses 2 L oxygen  at home as needed.   Comorbidities include CAD, hypertension, hyperlipidemia, essential thrombocytosis on hydroxyurea .   Continue aspirin .  Resume Plavix .   Case discussed with Dr. Tamea, pulmonologist,   Diet Order             Diet regular  Room service appropriate? Yes; Fluid consistency: Thin  Diet effective now                                  Consultants: Pulmonologist  Procedures: None    Medications:    arformoterol   15 mcg Nebulization BID   azithromycin   500 mg Oral Daily   budesonide   0.5 mg Nebulization BID   carvedilol   6.25 mg Oral BID WC   clopidogrel   75 mg Oral Daily   ezetimibe   10 mg Oral Daily   guaiFENesin   600 mg Oral BID   hydroxyurea   1,500 mg Oral Daily   loratadine   10 mg Oral Daily   predniSONE   20 mg Oral Q breakfast   revefenacin  175 mcg Nebulization Daily   roflumilast   500 mcg Oral Daily   rosuvastatin   20 mg Oral QHS   sertraline   100 mg Oral Daily   Continuous Infusions:   Anti-infectives (From admission, onward)    Start     Dose/Rate Route Frequency Ordered Stop   04/22/24 1530  azithromycin  (ZITHROMAX ) tablet 500 mg        500 mg Oral Daily 04/22/24 1442 04/25/24 0959              Family Communication/Anticipated D/C date and plan/Code Status   DVT prophylaxis: SCDs Start: 04/19/24 1532     Code Status: Do not attempt resuscitation (DNR) PRE-ARREST INTERVENTIONS DESIRED  Family  Communication: None Disposition Plan: Plan to discharge to resume   Status is: Inpatient Remains inpatient appropriate because: Pneumothorax       Subjective:   Interval events noted.  She complains of cough, pleuritic chest pain with cough and deep breathing.  No shortness of breath.  Objective:    Vitals:   04/21/24 1937 04/21/24 2029 04/21/24 2225 04/22/24 0355  BP:  (!) 136/50 (!) 158/63 (!) 165/66  Pulse:  84 93 86  Resp:  18 (!) 22 20  Temp:  99 F (37.2 C)  98.7 F (37.1 C)  TempSrc:  Oral  Oral  SpO2: 94% 96% 97% 96%  Weight:       No data found.   Intake/Output Summary (Last 24 hours) at 04/22/2024 1442 Last data filed at 04/21/2024 1710 Gross per 24 hour  Intake 240 ml  Output 0 ml  Net 240 ml   Filed Weights   04/19/24  1014  Weight: 62 kg    Exam:   GEN: NAD SKIN: Warm and dry EYES: No pallor or icterus ENT: MMM CV: RRR PULM: Bilateral rhonchi.  Chest tube in right lateral chest wall without any evidence of air leak ABD: soft, ND, NT, +BS CNS: AAO x 3, non focal EXT: No edema or tenderness         Data Reviewed:   I have personally reviewed following labs and imaging studies:  Labs: Labs show the following:   Basic Metabolic Panel: Recent Labs  Lab 04/19/24 1205 04/20/24 0437  NA 141 140  K 3.9 3.7  CL 106 104  CO2 26 25  GLUCOSE 134* 118*  BUN 12 13  CREATININE 0.63 0.54  CALCIUM  10.6* 9.9   GFR Estimated Creatinine Clearance: 41.1 mL/min (by C-G formula based on SCr of 0.54 mg/dL). Liver Function Tests: No results for input(s): AST, ALT, ALKPHOS, BILITOT, PROT, ALBUMIN in the last 168 hours. No results for input(s): LIPASE, AMYLASE in the last 168 hours. No results for input(s): AMMONIA in the last 168 hours. Coagulation profile No results for input(s): INR, PROTIME in the last 168 hours.  CBC: Recent Labs  Lab 04/19/24 1205 04/20/24 0437 04/21/24 0508  WBC 6.4 7.2 5.7  HGB 12.2 12.5 11.5*  HCT 37.0 37.7 35.5*  MCV 105.7* 104.7* 105.7*  PLT 500* 524* 523*   Cardiac Enzymes: No results for input(s): CKTOTAL, CKMB, CKMBINDEX, TROPONINI in the last 168 hours. BNP (last 3 results) No results for input(s): PROBNP in the last 8760 hours. CBG: Recent Labs  Lab 04/19/24 2232  GLUCAP 152*   D-Dimer: No results for input(s): DDIMER in the last 72 hours. Hgb A1c: No results for input(s): HGBA1C in the last 72 hours. Lipid Profile: No results for input(s): CHOL, HDL, LDLCALC, TRIG, CHOLHDL, LDLDIRECT in the last 72 hours. Thyroid  function studies: No results for input(s): TSH, T4TOTAL, T3FREE, THYROIDAB in the last 72 hours.  Invalid input(s): FREET3 Anemia work up: No results for input(s):  VITAMINB12, FOLATE, FERRITIN, TIBC, IRON, RETICCTPCT in the last 72 hours. Sepsis Labs: Recent Labs  Lab 04/19/24 1205 04/20/24 0437 04/21/24 0508  WBC 6.4 7.2 5.7    Microbiology No results found for this or any previous visit (from the past 240 hours).  Procedures and diagnostic studies:  DG Chest Port 1 View Result Date: 04/22/2024 EXAM: 1 VIEW XRAY OF THE CHEST 04/22/2024 07:28:50 AM COMPARISON: Comparison to yesterday. CLINICAL HISTORY: Pneumothorax on right FINDINGS: LINES, TUBES AND DEVICES: Right-sided pleural pigtail catheter has either  been repositioned or replaced, now more superiorly positioned. Median sternotomy. Left axillary node dissection. LUNGS AND PLEURA: No residual pneumothorax identified. Nonspecific interstitial coarsening is identified. HEART AND MEDIASTINUM: Mild cardiomegaly. BONES AND SOFT TISSUES: No acute osseous abnormality. IMPRESSION: 1. No residual right pneumothorax identified. 2. Right pleural catheter in place. 3. Mild cardiomegaly. Electronically signed by: Rockey Kilts MD 04/22/2024 11:33 AM EST RP Workstation: [id number]   DG Chest Port 1 View Result Date: 04/21/2024 EXAM: 1 VIEW(S) XRAY OF THE CHEST 04/21/2024 12:30:00 PM COMPARISON: 04/21/2024. CLINICAL HISTORY: [id number] Pneumothorax on right [id number] Pneumothorax on right FINDINGS: LINES, TUBES AND DEVICES: Right chest tube remains in place, unchanged. LUNGS AND PLEURA: Small right apical pneumothorax, increased in size since prior study. Right base atelectasis. No pleural effusion. HEART AND MEDIASTINUM: No acute abnormality of the cardiac and mediastinal silhouettes. BONES AND SOFT TISSUES: Prior median sternotomy. No acute osseous abnormality. IMPRESSION: 1. Small right apical pneumothorax, increased in size since prior study. 2. Right basilar atelectasis. Electronically signed by: Franky Crease MD 04/21/2024 04:48 PM EST RP Workstation: [id number]   DG Chest Port 1 View Result Date:  04/21/2024 EXAM: 1 VIEW(S) XRAY OF THE CHEST 04/21/2024 07:07:00 AM COMPARISON: 04/20/2024 CLINICAL HISTORY: Pneumothorax on right FINDINGS: LINES, TUBES AND DEVICES: Right chest tube is stable in position. LUNGS AND PLEURA: Right basilar atelectasis is present. Chronic coarsened interstitial markings are present without pulmonary edema. A trace right apical pneumothorax is present. No pleural effusion. HEART AND MEDIASTINUM: Aortic calcification is present. Status post median sternotomy and coronary artery bypass grafting (CABG). No acute abnormality of the cardiac and mediastinal silhouettes. BONES AND SOFT TISSUES: Left axillary surgical clips are noted. No acute osseous abnormality. IMPRESSION: 1. Trace right apical pneumothorax. 2. Right basilar atelectasis. 3. Chronic coarsened interstitial markings without pulmonary edema. Electronically signed by: Waddell Calk MD 04/21/2024 07:12 AM EST RP Workstation: [id number]               LOS: 3 days   Remona Boom  [facility]   Pager on [email or web address]. If 7PM-7AM, please contact night-coverage at [email or web address]     04/22/2024, 2:42 PM

## 2024-04-22 NOTE — Plan of Care (Signed)
   Problem: Clinical Measurements: Goal: Respiratory complications will improve Outcome: Progressing Goal: Cardiovascular complication will be avoided Outcome: Progressing

## 2024-04-23 ENCOUNTER — Inpatient Hospital Stay

## 2024-04-23 DIAGNOSIS — J939 Pneumothorax, unspecified: Secondary | ICD-10-CM | POA: Diagnosis not present

## 2024-04-23 DIAGNOSIS — I517 Cardiomegaly: Secondary | ICD-10-CM | POA: Diagnosis not present

## 2024-04-23 MED ORDER — SACUBITRIL-VALSARTAN 24-26 MG PO TABS: 1.0000 | ORAL_TABLET | Freq: Two times a day (BID) | ORAL | Status: AC

## 2024-04-23 MED ORDER — OXYCODONE HCL 5 MG PO TABS: 5.0000 mg | ORAL_TABLET | Freq: Two times a day (BID) | ORAL | 0 refills | Status: DC | PRN

## 2024-04-23 MED ORDER — AZITHROMYCIN 500 MG PO TABS: 500.0000 mg | ORAL_TABLET | Freq: Every day | ORAL | 0 refills | Status: DC

## 2024-04-23 MED ORDER — PREDNISONE 20 MG PO TABS: 20.0000 mg | ORAL_TABLET | Freq: Every day | ORAL | 0 refills | Status: AC

## 2024-04-23 MED ORDER — REVEFENACIN 175 MCG/3ML IN SOLN: 175.0000 ug | Freq: Every day | RESPIRATORY_TRACT | 0 refills | Status: AC

## 2024-04-23 NOTE — Discharge Summary (Addendum)
 Physician Discharge Summary   Patient: Nancy Merritt MRN: [id number] DOB: 09-14-1940  Admit date:     04/19/2024  Discharge date: 04/23/24  Discharge Physician: AIDA CHO   PCP: Rilla Baller, MD   Recommendations at discharge:   Follow-up with Dr. Isaiah, pulmonologist on 04/29/2024. Follow-up with PCP in 1 to 2 weeks  Discharge Diagnoses: Principal Problem:   Pneumothorax Active Problems:   COPD with acute exacerbation (HCC)   Acute on chronic respiratory failure with hypoxia (HCC)  Resolved Problems:   * No resolved hospital problems. *  Hospital Course:  Nancy Merritt is a 83 y.o. female with medical history significant of COPD, chronic hypoxic respiratory failure on 2 L of nasal cannula with activity, hypertension, CAD status post CABG, essential thrombocytosis, who presented to the hospital with right-sided rib pain following a fall at home.  She missed a step and fell in her bedroom.  She said if she fell on her right side against the nightstand.     She was found to have right-sided rib fractures with moderate right hydropneumothorax.  Chest tube was placed in the right pleural space by ED physician.  She was admitted to the hospital for further management.     Assessment and Plan:  Traumatic pneumothorax: S/p chest tube placement by ED physician on 04/19/2024. Repeat chest x-ray on 04/22/2024 did not show any evidence of residual right pneumothorax. Right chest wall tube was removed by Dr. Tamea, pulmonologist, on 04/22/2024. Follow-up chest x-ray was unremarkable. Chest pain and shortness of breath are resolved.     Right 6th and 7th posterior rib fractures, s/p fall at home: Analgesics as needed for pain.  On reevaluation, PT recommended discharge to home with home health therapy.     COPD exacerbation: Continue bronchodilators.  Yupelri  has been added to home bronchodilator regimen.  Continue prednisone  and azithromycin  for 3 more days.   Chronic  hypoxic respiratory failure: She uses 2 L oxygen  at home as needed.     Comorbidities include CAD on aspirin  and Plavix , hypertension, hyperlipidemia, essential thrombocytosis on hydroxyurea .     Her condition has improved and she is deemed stable for discharge to home today.  Discharge plan was discussed in person with Dr. Tamea, pulmonologist.  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 Discharge plan discussed with the patient and she is agreeable with the plan.  She is deemed stable for discharge to home with home health therapy.       Consultants: Pulmonologist Procedures performed: Right chest wall tube placement Disposition: Home health Diet recommendation:  Discharge Diet Orders (From admission, onward)     Start     Ordered   04/23/24 0000  Diet - low sodium heart healthy        04/23/24 1523           Cardiac diet DISCHARGE MEDICATION: Allergies as of 04/23/2024       Reactions   Advair Diskus [fluticasone -salmeterol] Shortness Of Breath   As of 09/01/2014. Pt can take Breo   Bee Pollen Other (See Comments)   Budesonide -formoterol Fumarate Other (See Comments)   Hard time breathing as of 10/31/2014.   Levofloxacin Other (See Comments)   Nausea, brain fog, depression, as of 12/12/14   Pollen Extract Cough   Qvar [beclomethasone] Other (See Comments)   Lungs closing-as of 05/03/2015   Singulair [montelukast] Shortness Of Breath        Medication List     STOP taking these medications  doxycycline  100 MG tablet Commonly known as: VIBRA -TABS   ipratropium-albuterol  0.5-2.5 (3) MG/3ML Soln Commonly known as: DUONEB       TAKE these medications    acetaminophen  500 MG tablet Commonly known as: TYLENOL  Take 1 tablet (500 mg total) by mouth 3 (three) times daily as needed for moderate pain.   albuterol  108 (90 Base) MCG/ACT inhaler Commonly known as: VENTOLIN  HFA INHALE 2 PUFFS INTO THE LUNGS EVERY 4 HOURS AS NEEDED FOR WHEEZING  OR SHORTNESS OF BREATH.   arformoterol  15 MCG/2ML Nebu Commonly known as: BROVANA  Take 2 mLs (15 mcg total) by nebulization 2 (two) times daily.   aspirin  81 MG chewable tablet Chew 1 tablet (81 mg total) by mouth daily.   azithromycin  500 MG tablet Commonly known as: ZITHROMAX  Take 1 tablet (500 mg total) by mouth daily. Start taking on: April 24, 2024   budesonide  0.5 MG/2ML nebulizer solution Commonly known as: PULMICORT  Take 2 mLs (0.5 mg total) by nebulization 2 (two) times daily.   carvedilol  6.25 MG tablet Commonly known as: COREG  TAKE 1 TABLET BY MOUTH 2 TIMES DAILY WITH A MEAL.   clopidogrel  75 MG tablet Commonly known as: PLAVIX  Take 1 tablet (75 mg total) by mouth daily with breakfast.   ezetimibe  10 MG tablet Commonly known as: ZETIA  TAKE 1 TABLET BY MOUTH EVERY DAY   fluticasone  50 MCG/ACT nasal spray Commonly known as: FLONASE  Place 2 sprays into both nostrils daily.   hydroxyurea  500 MG capsule Commonly known as: HYDREA  Take 3 capsules (1,500 mg total) by mouth daily. May take with food to minimize GI side effects.   loratadine  10 MG tablet Commonly known as: CLARITIN  Take 10 mg by mouth daily.   multivitamin tablet Take 1 tablet by mouth daily.   nitroGLYCERIN  0.4 MG SL tablet Commonly known as: NITROSTAT  Place 1 tablet (0.4 mg total) under the tongue every 5 (five) minutes as needed for chest pain.   oxyCODONE  5 MG immediate release tablet Commonly known as: Oxy IR/ROXICODONE  Take 1 tablet (5 mg total) by mouth every 12 (twelve) hours as needed for moderate pain (pain score 4-6).   predniSONE  20 MG tablet Commonly known as: DELTASONE  Take 1 tablet (20 mg total) by mouth daily with breakfast for 3 days. Start taking on: April 24, 2024   PreserVision AREDS 2 Caps Take 2 each by mouth daily at 12 noon.   revefenacin  175 MCG/3ML nebulizer solution Commonly known as: YUPELRI  Take 3 mLs (175 mcg total) by nebulization daily. Start taking  on: April 24, 2024   roflumilast  500 MCG Tabs tablet Commonly known as: DALIRESP  TAKE 1 TABLET BY MOUTH DAILY   rosuvastatin  20 MG tablet Commonly known as: CRESTOR  Take 1 tablet (20 mg total) by mouth at bedtime.   sacubitril -valsartan  24-26 MG Commonly known as: Entresto  Take 1 tablet by mouth 2 (two) times daily.   sertraline  100 MG tablet Commonly known as: ZOLOFT  Take 1 tablet (100 mg total) by mouth daily.   Vitamin D3 25 MCG (1000 UT) Caps Take 2 capsules (2,000 Units total) by mouth daily.        Contact information for follow-up providers     Isaiah Scrivener, MD Follow up on 04/29/2024.   Specialties: Pulmonary Disease, Cardiology Why: At 1:30 PM Contact information: [address] [place] [address] [phone]              Contact information for after-discharge care     Home Medical Care  [facility]) .   Service: Home Health Services Contact information: [address] [place]  [address] [phone]                    Discharge Exam: Fredricka Weights   04/19/24 1014  Weight: 62 kg   GEN: NAD SKIN: Warm and dry EYES: No pallor or icterus ENT: MMM CV: RRR PULM: Bilateral rhonchi  ABD: soft, ND, NT, +BS CNS: AAO x 3, non focal EXT: No edema or tenderness   Condition at discharge: stable  The results of significant diagnostics from this hospitalization (including imaging, microbiology, ancillary and laboratory) are listed below for reference.   Imaging Studies: DG Chest Port 1 View Result Date: 04/23/2024 EXAM: 1 VIEW(S) XRAY OF THE CHEST 04/23/2024 10:21:00 AM COMPARISON: 04/22/2024 CLINICAL HISTORY: Pneumothorax on right FINDINGS: LUNGS AND PLEURA: A trace right pneumothorax is present. Aeration is improved. No pleural effusion. HEART AND MEDIASTINUM: Stable cardiomegaly without failure. BONES AND SOFT TISSUES: Left axillary surgical clips are noted.  No acute osseous abnormality. IMPRESSION: 1. Trace right pneumothorax. 2. Stable cardiomegaly without failure. Electronically signed by: Lonni Necessary MD 04/23/2024 03:33 PM EST RP Workstation: [id number]   DG Chest Port 1 View Result Date: 04/22/2024 EXAM: 1 VIEW XRAY OF THE CHEST 04/22/2024 07:28:50 AM COMPARISON: Comparison to yesterday. CLINICAL HISTORY: Pneumothorax on right FINDINGS: LINES, TUBES AND DEVICES: Right-sided pleural pigtail catheter has either been repositioned or replaced, now more superiorly positioned. Median sternotomy. Left axillary node dissection. LUNGS AND PLEURA: No residual pneumothorax identified. Nonspecific interstitial coarsening is identified. HEART AND MEDIASTINUM: Mild cardiomegaly. BONES AND SOFT TISSUES: No acute osseous abnormality. IMPRESSION: 1. No residual right pneumothorax identified. 2. Right pleural catheter in place. 3. Mild cardiomegaly. Electronically signed by: Rockey Kilts MD 04/22/2024 11:33 AM EST RP Workstation: [id number]   DG Chest Port 1 View Result Date: 04/21/2024 EXAM: 1 VIEW(S) XRAY OF THE CHEST 04/21/2024 12:30:00 PM COMPARISON: 04/21/2024. CLINICAL HISTORY: [id number] Pneumothorax on right [id number] Pneumothorax on right FINDINGS: LINES, TUBES AND DEVICES: Right chest tube remains in place, unchanged. LUNGS AND PLEURA: Small right apical pneumothorax, increased in size since prior study. Right base atelectasis. No pleural effusion. HEART AND MEDIASTINUM: No acute abnormality of the cardiac and mediastinal silhouettes. BONES AND SOFT TISSUES: Prior median sternotomy. No acute osseous abnormality. IMPRESSION: 1. Small right apical pneumothorax, increased in size since prior study. 2. Right basilar atelectasis. Electronically signed by: Franky Crease MD 04/21/2024 04:48 PM EST RP Workstation: [id number]   DG Chest Port 1 View Result Date: 04/21/2024 EXAM: 1 VIEW(S) XRAY OF THE CHEST 04/21/2024 07:07:00 AM COMPARISON: 04/20/2024 CLINICAL HISTORY:  Pneumothorax on right FINDINGS: LINES, TUBES AND DEVICES: Right chest tube is stable in position. LUNGS AND PLEURA: Right basilar atelectasis is present. Chronic coarsened interstitial markings are present without pulmonary edema. A trace right apical pneumothorax is present. No pleural effusion. HEART AND MEDIASTINUM: Aortic calcification is present. Status post median sternotomy and coronary artery bypass grafting (CABG). No acute abnormality of the cardiac and mediastinal silhouettes. BONES AND SOFT TISSUES: Left axillary surgical clips are noted. No acute osseous abnormality. IMPRESSION: 1. Trace right apical pneumothorax. 2. Right basilar atelectasis. 3. Chronic coarsened interstitial markings without pulmonary edema. Electronically signed by: Waddell Calk MD 04/21/2024 07:12 AM EST RP Workstation: [id number]   Portable chest 1 View Result Date: 04/20/2024 EXAM: 1 VIEW XRAY OF THE CHEST 04/20/2024 06:10:00 AM COMPARISON: 04/19/2024 CLINICAL HISTORY: Pneumothorax. FINDINGS: LINES, TUBES AND  DEVICES: Right chest tube stable in position. LUNGS AND PLEURA: Linear opacity at right lung base consistent with atelectasis. Trace right apical pneumothorax. No pleural effusion. HEART AND MEDIASTINUM: Aortic arch atherosclerosis. Median sternotomy wires noted. No acute abnormality of the cardiac silhouettes. BONES AND SOFT TISSUES: Surgical clips in left axilla. No acute osseous abnormality. IMPRESSION: 1. Trace right apical pneumothorax. Unchanged from previous exam . 2. Right chest tube stable in position. Electronically signed by: Waddell Calk MD 04/20/2024 06:20 AM EST RP Workstation: [id number]   DG Chest Portable 1 View Result Date: 04/19/2024 EXAM: 1 VIEW XRAY OF THE CHEST 04/19/2024 01:54:00 PM COMPARISON: Chest radiograph of 12/27/23, compared to chest CT of earlier today. CLINICAL HISTORY: S/P chest tube FINDINGS: LINES, TUBES AND DEVICES: Right sided pleural pigtail catheter placement. LUNGS AND PLEURA:  Bibasilar scarring. Suspected residual less than 5 percent right apical pneumothorax. HEART AND MEDIASTINUM: Pacer/defibrillator projects over the left hemithorax. BONES AND SOFT TISSUES: Fracture of the 3rd sternal wire, median sternotomy. IMPRESSION: 1. Right-sided pleural pigtail catheter placement with suspicion of a less than 5% residual right apical pneumothorax. Electronically signed by: Rockey Kilts MD 04/19/2024 02:41 PM EST RP Workstation: [id number]   CT Chest Wo Contrast Result Date: 04/19/2024 CLINICAL DATA:  Chest trauma, fall, pain. EXAM: CT CHEST WITHOUT CONTRAST TECHNIQUE: Multidetector CT imaging of the chest was performed following the standard protocol without IV contrast. RADIATION DOSE REDUCTION: This exam was performed according to the departmental dose-optimization program which includes automated exposure control, adjustment of the mA and/or kV according to patient size and/or use of iterative reconstruction technique. COMPARISON:  05/26/2023. FINDINGS: Cardiovascular: Atherosclerotic calcification of the aorta, aortic valve and coronary arteries. Enlarged pulmonic trunk and heart. No pericardial effusion. Mediastinum/Nodes: No pathologically enlarged mediastinal or axillary lymph nodes. Hilar regions are difficult to definitively evaluate without IV contrast. 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 Air in the esophagus can be seen with dysmotility. Lungs/Pleura: Moderate right pneumothorax with a small right pleural effusion. Centrilobular emphysema. Focal scarring and consolidation in the medial segment right middle lobe. Dependent atelectasis in the right lower lobe. Postinfectious peribronchovascular nodularity in the left upper and left lower lobes. Scattered mucoid impaction and peribronchial thickening. No pleural fluid. Debris in the airway. Upper Abdomen: Small hiatal hernia. Fluid density mass in the left upper quadrant measures 2.9 x 5.6 cm, as on 05/26/2023  favoring a benign etiology such as a lymphangioma. Visualized portions of the liver, gallbladder, adrenal glands, kidneys, spleen, pancreas, stomach and bowel are otherwise grossly unremarkable. No upper abdominal adenopathy. Musculoskeletal: Degenerative changes in the spine. Osteopenia. There are fractures of the right sixth and seventh posterolateral ribs. IMPRESSION: 1. Right sixth and seventh posterior rib fractures with a moderate right hydropneumothorax. Critical Value/emergent results were called by telephone at the time of interpretation on 04/19/2024 at 11:53 am to provider LAMAR PRICE , who verbally acknowledged these results. 2. Probable scarring and compressive atelectasis in the medial segment right middle lobe. Consider follow-up CT chest without contrast in 4-6 weeks in further evaluation, as malignancy cannot be excluded. 3. Aortic atherosclerosis (ICD10-I70.0). Coronary artery calcification. 4. Enlarged pulmonic trunk, indicative of pulmonary arterial hypertension. 5.  Emphysema (ICD10-J43.9). Electronically Signed   By: Newell Eke M.D.   On: 04/19/2024 11:53    Microbiology: Results for orders placed or performed during the hospital encounter of 05/26/23  Resp panel by RT-PCR (RSV, Flu A&B, Covid) Anterior Nasal Swab     Status: None   Collection Time:  05/27/23 12:09 AM   Specimen: Anterior Nasal Swab  Result Value Ref Range Status   SARS Coronavirus 2 by RT PCR NEGATIVE NEGATIVE Final   Influenza A by PCR NEGATIVE NEGATIVE Final   Influenza B by PCR NEGATIVE NEGATIVE Final    Comment: (NOTE) The Xpert Xpress SARS-CoV-2/FLU/RSV plus assay is intended as an aid in the diagnosis of influenza from Nasopharyngeal swab specimens and should not be used as a sole basis for treatment. Nasal washings and aspirates are unacceptable for Xpert Xpress SARS-CoV-2/FLU/RSV testing.  Fact Sheet for Patients: bloggercourse.com  Fact Sheet for Healthcare  Providers: seriousbroker.it  This test is not yet approved or cleared by the [place]  FDA and has been authorized for detection and/or diagnosis of SARS-CoV-2 by FDA under an Emergency Use Authorization (EUA). This EUA will remain in effect (meaning this test can be used) for the duration of the COVID-19 declaration under Section 564(b)(1) of the Act, 21 U.S.C. section 360bbb-3(b)(1), unless the authorization is terminated or revoked.     Resp Syncytial Virus by PCR NEGATIVE NEGATIVE Final    Comment: (NOTE) Fact Sheet for Patients: bloggercourse.com  Fact Sheet for Healthcare Providers: seriousbroker.it  This test is not yet approved or cleared by the [place]  FDA and has been authorized for detection and/or diagnosis of SARS-CoV-2 by FDA under an Emergency Use Authorization (EUA). This EUA will remain in effect (meaning this test can be used) for the duration of the COVID-19 declaration under Section 564(b)(1) of the Act, 21 U.S.C. section 360bbb-3(b)(1), unless the authorization is terminated or revoked.  Performed at [facility], [address], [place], [place] [address]     Labs: CBC: Recent Labs  Lab 04/19/24 1205 04/20/24 0437 04/21/24 0508  WBC 6.4 7.2 5.7  HGB 12.2 12.5 11.5*  HCT 37.0 37.7 35.5*  MCV 105.7* 104.7* 105.7*  PLT 500* 524* 523*   Basic Metabolic Panel: Recent Labs  Lab 04/19/24 1205 04/20/24 0437  NA 141 140  K 3.9 3.7  CL 106 104  CO2 26 25  GLUCOSE 134* 118*  BUN 12 13  CREATININE 0.63 0.54  CALCIUM  10.6* 9.9   Liver Function Tests: No results for input(s): AST, ALT, ALKPHOS, BILITOT, PROT, ALBUMIN in the last 168 hours. CBG: Recent Labs  Lab 04/19/24 2232  GLUCAP 152*    Discharge time spent: greater than 30 minutes.  Signed: AIDA CHO, MD [facility] 04/23/2024

## 2024-04-23 NOTE — Plan of Care (Signed)

## 2024-04-23 NOTE — Progress Notes (Signed)
 NAME:  Nancy Merritt, MRN:  [id number], DOB:  [birth year], LOS: 4 ADMISSION DATE:  04/19/2024, CONSULTATION DATE: 04/20/2024 REFERRING MD: Laree Ponnala,MD, CHIEF COMPLAINT: Pneumothorax, chest tube management requested.  History of Present Illness:  Patient is an 83 year old former smoker with a history of asthma COPD overlap syndrome, chronic respiratory failure and bronchiectasis who presented on 19 April 2024 after sustaining a fall at home striking the right side of her chest on her nightstand.  She did not have loss of consciousness.  She states she missed a step in her bedroom as the cause of the fall.  She had persistent rib cage pain so she presented to the emergency room.  A chest CT performed showed a moderate right pneumothorax, small right pleural effusion, emphysema and fractures of the right 6th and 7th posterolateral ribs.  She had a chest tube inserted in the emergency room.  We are asked to of chest tube management.  The patient has noted some increased congestion and cough which preceded the aforementioned trauma.  She has not had any fevers or chills.  Currently she is having difficulty bringing up secretions due to chest pain when coughing due to her rib fractures.  She does not endorse any fevers, chills or sweats.  Since the chest tube was placed she feels her dyspnea is at baseline.  Significant Hospital Events: Including procedures, antibiotic start and stop dates in addition to other pertinent events   04/19/2024 right traumatic pneumothorax, chest tube placed 04/20/2024 PCCM consulted 04/21/2024 chest tube placed to waterseal 04/22/2024 chest tube removed   Interim History / Subjective:  Feels better today.  Less congested. Doing well with nebulizers.  Oxygen  supplementation down to 2 L/min which is baseline for the patient.  Feels relieved chest tube is out.  Objective    Blood pressure (!) 165/84, pulse 87, temperature 98.1 F (36.7 C), temperature source  Oral, resp. rate 20, weight 62 kg, SpO2 100%.    SpO2: 100 % O2 Flow Rate (L/min): 2 L/min FiO2 (%): 28 % FiO2 (%):  [28 %] 28 %   Intake/Output Summary (Last 24 hours) at 04/23/2024 1154 Last data filed at 04/23/2024 0900 Gross per 24 hour  Intake 360 ml  Output --  Net 360 ml   Filed Weights   04/19/24 1014  Weight: 62 kg    Examination: General: Frail appearing elderly woman, laying in bed comfortably.  Chronic use of accessories of respiration.  No conversational dyspnea.  Awake and alert. HEENT: Poor dentition, chipped teeth.  Oral mucosa moist.  No thrush. Lungs: Scattered rhonchi throughout.  No wheezes.  Chest tube out.   Cardiovascular: Regular rate and rhythm, no rubs murmurs gallops heard. Abdomen: Benign. Extremities: Trace edema bilaterally.  No joint deformities. Neuro: Grossly intact. GU: Pure wick in place.  Chest tube: NO leak noted, chest tube to waterseal.  Imaging  Chest x-ray today 12/05 showing chest tube out with no pneumothorax evident, pneumothorax resolved:   Assessment and Plan  Acute on chronic respiratory failure due to traumatic right pneumothorax Hx: COPD/asthma/bronchiectasis No pneumothorax on chest x-ray Chest tube out yesterday Continue supplemental O2 to maintain oxygen  saturations at 90% or better  COPD exacerbation due to the above, improving Continue nebulization treatments Continue Yupelri , notes benefit Acapella flutter valve for mucociliary clearance Prednisone  20 mg daily x 5 days (day 2 of 5 today) Azithromycin  250 mg 2 tablets x 1 today then 1 tablet daily x 5 days (day 2 of 5 today)  Labs   CBC: Recent Labs  Lab 04/19/24 1205 04/20/24 0437 04/21/24 0508  WBC 6.4 7.2 5.7  HGB 12.2 12.5 11.5*  HCT 37.0 37.7 35.5*  MCV 105.7* 104.7* 105.7*  PLT 500* 524* 523*    Basic Metabolic Panel: Recent Labs  Lab 04/19/24 1205 04/20/24 0437  NA 141 140  K 3.9 3.7  CL 106 104  CO2 26 25  GLUCOSE 134* 118*  BUN 12  13  CREATININE 0.63 0.54  CALCIUM  10.6* 9.9   GFR: Estimated Creatinine Clearance: 41.1 mL/min (by C-G formula based on SCr of 0.54 mg/dL). Recent Labs  Lab 04/19/24 1205 04/20/24 0437 04/21/24 0508  WBC 6.4 7.2 5.7    Liver Function Tests: No results for input(s): AST, ALT, ALKPHOS, BILITOT, PROT, ALBUMIN in the last 168 hours. No results for input(s): LIPASE, AMYLASE in the last 168 hours. No results for input(s): AMMONIA in the last 168 hours.  ABG    Component Value Date/Time   HCO3 24.4 05/08/2019 1743   TCO2 26 05/08/2019 1743   O2SAT 92.0 05/08/2019 1743     Coagulation Profile: No results for input(s): INR, PROTIME in the last 168 hours.  Cardiac Enzymes: No results for input(s): CKTOTAL, CKMB, CKMBINDEX, TROPONINI in the last 168 hours.  HbA1C: Hgb A1c MFr Bld  Date/Time Value Ref Range Status  08/08/2023 10:41 AM 5.9 4.6 - 6.5 % Final    Comment:    Glycemic Control Guidelines for People with Diabetes:Non Diabetic:  <6%Goal of Therapy: <7%Additional Action Suggested:  >8%   08/13/2022 01:38 PM 6.5 4.6 - 6.5 % Final    Comment:    Glycemic Control Guidelines for People with Diabetes:Non Diabetic:  <6%Goal of Therapy: <7%Additional Action Suggested:  >8%     CBG: Recent Labs  Lab 04/19/24 2232  GLUCAP 152*    Review of Systems:   A 10 point review of systems was performed and it is as noted above otherwise negative.  Past Medical History:  She,  has a past medical history of Actinic keratosis, Arthritis, Asthma, Basal cell carcinoma (08/18/2023), Cancer (HCC) (1990), CHF (congestive heart failure) (HCC), COPD (chronic obstructive pulmonary disease) (HCC), Coronary artery disease, COVID-19 (06/14/2020), Depression, breast cancer (04/07/2018), and Hypertension.   Surgical History:   Past Surgical History:  Procedure Laterality Date   BREAST SURGERY Left 1990   CORONARY ARTERY BYPASS GRAFT     JOINT REPLACEMENT Right     Hip   LEFT HEART CATH AND CORS/GRAFTS ANGIOGRAPHY N/A 06/05/2022   Procedure: LEFT HEART CATH AND CORS/GRAFTS ANGIOGRAPHY;  Surgeon: Darron Deatrice LABOR, MD;  Location: [facility];  Service: Cardiovascular;  Laterality: N/A;   OVARIAN CYST REMOVAL  1970   TUBAL LIGATION  1970     Social History:   reports that she quit smoking about 23 years ago. Her smoking use included cigarettes. She started smoking about 63 years ago. She has a 40 pack-year smoking history. She has never used smokeless tobacco. She reports that she does not currently use alcohol. She reports that she does not use drugs.   Family History:  Her family history includes Alcohol abuse in her brother, daughter, father, and maternal grandfather; Arthritis in her brother, daughter, daughter, daughter, father, maternal grandfather, maternal grandmother, mother, sister, and sister; Asthma in her daughter; Breast cancer (age of onset: 55) in her sister; COPD in her daughter, daughter, father, mother, and sister; Cancer in her father, maternal grandfather, and sister; Depression in her daughter, daughter,  daughter, and maternal grandfather; Diabetes in her father and mother; Drug abuse in her sister; Hearing loss in her daughter, maternal grandfather, and mother; Heart attack (age of onset: 76) in her father; Hypertension in her brother, daughter, daughter, daughter, father, maternal grandfather, maternal grandmother, mother, and sister; Miscarriages / Stillbirths in her daughter, mother, and sister; Stroke in her maternal grandmother and mother; Thyroid  disease in her daughter, daughter, daughter, maternal grandmother, sister, and sister.   Allergies Allergies  Allergen Reactions   Advair Diskus [Fluticasone -Salmeterol] Shortness Of Breath    As of 09/01/2014. Pt can take Breo   Bee Pollen Other (See Comments)   Budesonide -Formoterol Fumarate Other (See Comments)    Hard time breathing as of 10/31/2014.   Levofloxacin Other (See  Comments)    Nausea, brain fog, depression, as of 12/12/14   Pollen Extract Cough   Qvar [Beclomethasone] Other (See Comments)    Lungs closing-as of 05/03/2015   Singulair [Montelukast] Shortness Of Breath     Home Medications  Prior to Admission medications   Medication Sig Start Date End Date Taking? Authorizing Provider  albuterol  (VENTOLIN  HFA) 108 (90 Base) MCG/ACT inhaler INHALE 2 PUFFS INTO THE LUNGS EVERY 4 HOURS AS NEEDED FOR WHEEZING OR SHORTNESS OF BREATH. 11/04/23  Yes Rilla Baller, MD  arformoterol  (BROVANA ) 15 MCG/2ML NEBU Take 2 mLs (15 mcg total) by nebulization 2 (two) times daily. 10/15/23  Yes Kasa, Kurian, MD  aspirin  81 MG chewable tablet Chew 1 tablet (81 mg total) by mouth daily. 06/07/22  Yes Meng, Hao, PA  budesonide  (PULMICORT ) 0.5 MG/2ML nebulizer solution Take 2 mLs (0.5 mg total) by nebulization 2 (two) times daily. 10/15/23 10/14/24 Yes Kasa, Kurian, MD  carvedilol  (COREG ) 6.25 MG tablet TAKE 1 TABLET BY MOUTH 2 TIMES DAILY WITH A MEAL. 09/29/23  Yes Meng, Hao, PA  Cholecalciferol  (VITAMIN D3) 25 MCG (1000 UT) capsule Take 2 capsules (2,000 Units total) by mouth daily. 08/15/22  Yes Rilla Baller, MD  clopidogrel  (PLAVIX ) 75 MG tablet Take 1 tablet (75 mg total) by mouth daily with breakfast. 10/17/23  Yes Okey Vina GAILS, MD  ezetimibe  (ZETIA ) 10 MG tablet TAKE 1 TABLET BY MOUTH EVERY DAY 06/17/23  Yes Meng, Hao, PA  fluticasone  (FLONASE ) 50 MCG/ACT nasal spray Place 2 sprays into both nostrils daily. 08/15/23  Yes Rilla Baller, MD  hydroxyurea  (HYDREA ) 500 MG capsule Take 3 capsules (1,500 mg total) by mouth daily. May take with food to minimize GI side effects. 06/19/23  Yes Jacobo Evalene PARAS, MD  loratadine  (CLARITIN ) 10 MG tablet Take 10 mg by mouth daily.   Yes [provider]  Multiple Vitamin (MULTIVITAMIN) tablet Take 1 tablet by mouth daily.   Yes [provider]  Multiple Vitamins-Minerals (PRESERVISION AREDS 2) CAPS Take 2 each by  mouth daily at 12 noon.   Yes [provider]  nitroGLYCERIN  (NITROSTAT ) 0.4 MG SL tablet Place 1 tablet (0.4 mg total) under the tongue every 5 (five) minutes as needed for chest pain. 06/06/22  Yes Meng, Hao, PA  roflumilast  (DALIRESP ) 500 MCG TABS tablet TAKE 1 TABLET BY MOUTH DAILY 02/05/24  Yes Kasa, Kurian, MD  rosuvastatin  (CRESTOR ) 20 MG tablet Take 1 tablet (20 mg total) by mouth at bedtime. 06/06/22  Yes Meng, Hao, PA  sacubitril -valsartan  (ENTRESTO ) 24-26 MG Take 2 tablets by mouth 2 (two) times daily. Patient taking differently: Take 1 tablet by mouth 2 (two) times daily. 06/24/23  Yes Okey Vina GAILS, MD  sertraline  (ZOLOFT ) 100 MG  tablet Take 1 tablet (100 mg total) by mouth daily. 08/15/23  Yes Rilla Baller, MD  acetaminophen  (TYLENOL ) 500 MG tablet Take 1 tablet (500 mg total) by mouth 3 (three) times daily as needed for moderate pain. 06/11/22   Rilla Baller, MD  doxycycline  (VIBRA -TABS) 100 MG tablet Take 1 tablet (100 mg total) by mouth 2 (two) times daily. Patient not taking: Reported on 04/19/2024 12/10/23   Tamea Dedra CROME, MD  ipratropium-albuterol  (DUONEB) 0.5-2.5 (3) MG/3ML SOLN Take 3 mLs by nebulization every 4 (four) hours as needed. Patient not taking: Reported on 04/19/2024 03/04/24   Kasa, Kurian, MD    Scheduled Meds:  arformoterol   15 mcg Nebulization BID   azithromycin   500 mg Oral Daily   budesonide   0.5 mg Nebulization BID   carvedilol   6.25 mg Oral BID WC   clopidogrel   75 mg Oral Daily   ezetimibe   10 mg Oral Daily   guaiFENesin   600 mg Oral BID   hydroxyurea   1,500 mg Oral Daily   loratadine   10 mg Oral Daily   predniSONE   20 mg Oral Q breakfast   revefenacin   175 mcg Nebulization Daily   roflumilast   500 mcg Oral Daily   rosuvastatin   20 mg Oral QHS   sertraline   100 mg Oral Daily   Continuous Infusions: PRN Meds:.acetaminophen  **OR** acetaminophen , albuterol , morphine  injection, ondansetron  **OR** ondansetron  (ZOFRAN ) IV, oxyCODONE ,  senna-docusate  Level 2 follow-up   Discussed with Dr. Jens in person.  From our standpoint patient may be discharged to SNF as planned.  Has follow-up appointment with Dr. Alm Cellar, her primary pulmonologist, on 29 April 2024 at 1:30 PM.   I spent 35 minutes of dedicated to the care of this patient on the date of this encounter to include pre-visit review of records, face-to-face time with the patient discussing conditions above, post visit ordering of testing, clinical documentation with the electronic health record, making appropriate referrals as documented, and communicating necessary findings to members of the patients care team.   C. Leita Tamea, MD Advanced Bronchoscopy PCCM [facility]    *This note was generated using voice recognition software/Dragon and/or AI transcription program.  Despite best efforts to proofread, errors can occur which can change the meaning. Any transcriptional errors that result from this process are unintentional and may not be fully corrected at the time of dictation.

## 2024-04-23 NOTE — TOC Transition Note (Signed)
 Transition of Care [facility]) - Discharge Note   Patient Details  Name: Nancy Merritt MRN: [id number] Date of Birth: [birth year]  [facility] CM/SW Contact:  Corean ONEIDA Haddock, RN Phone Number: 04/23/2024, 3:38 PM   Clinical Narrative:     Therapy recs have been updated to [facility] Met with patient at bedside.  CMS Medicare.gov Compare Post Acute Care list reviewed with patient.  She accepts [facility].  Accepted in [place], and notified Kelsey with [facility].  Patient states her family will transport at discharge and bring portable O2 for transport    Barriers to Discharge: Continued Medical Work up   Patient Goals and CMS Choice Patient states their goals for this hospitalization and ongoing recovery are:: Wants to live to 120- also wants to get back to where she was before she fell, back to doing the things she likes to do. CMS Medicare.gov Compare Post Acute Care list provided to:: Patient Choice offered to / list presented to : Patient      Discharge Placement                       Discharge Plan and Services Additional resources added to the After Visit Summary for     Discharge Planning Services: CM Consult Post Acute Care Choice: Skilled Nursing Facility          DME Arranged: N/A                    Social Drivers of Health (SDOH) Interventions SDOH Screenings   Food Insecurity: No Food Insecurity (04/20/2024)  Housing: Low Risk  (04/20/2024)  Transportation Needs: No Transportation Needs (04/20/2024)  Utilities: Not At Risk (04/20/2024)  Alcohol Screen: Low Risk  (07/01/2023)  Depression (PHQ2-9): Low Risk  (07/01/2023)  Recent Concern: Depression (PHQ2-9) - High Risk (04/29/2023)  Financial Resource Strain: Low Risk  (07/01/2023)  Physical Activity: Inactive (07/01/2023)  Social Connections: Moderately Isolated (04/20/2024)  Stress: No Stress Concern Present (07/01/2023)  Tobacco Use: Medium Risk (04/01/2024)   Received from [facility]  Literacy: Adequate Health Literacy (07/01/2023)     Readmission Risk Interventions     No data to display

## 2024-04-23 NOTE — Progress Notes (Signed)
 Physical Therapy Treatment Patient Details Name: Nancy Merritt MRN: [id number] DOB: 18-Sep-1940 Today's Date: 04/23/2024   History of Present Illness Patient is a 83 year old female with fall and right side rib pain. CT scan of the chest revealed right sided rib fractures with a moderate  right hydropneumothorax. 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    PT Comments  Pt was seated EOB on 2 L o2 upon arrival. She remains A and O x 4. HOH but very pleasant. Continues to present with congestive cough but struggles to clear due to rib pain. Pt's sao2 > 91 % on 2 L o2 [place]. She tolerated standing and ambulating 2 x (60 ft),(172ft) with RW. No LOB or safety concerns. Seated rest between trial. Overall pt is progressing well. Does endorse some anxiety about Dcing home versus SNF however dino still feels DC recs are appropriate. Pt has 24/7 assistance available and confirm with daughter previous date that she also would prefer pt to DC directly home with [facility] to follow. Pt will benefit from continued skilled PT at DC to maximize independence and safety with all ADLs.    If plan is discharge home, recommend the following: A little help with walking and/or transfers;A little help with bathing/dressing/bathroom;Assistance with cooking/housework;Help with stairs or ramp for entrance     Equipment Recommendations  None recommended by PT       Precautions / Restrictions Precautions Precautions: Fall Recall of Precautions/Restrictions: Intact Restrictions Weight Bearing Restrictions Per Provider Order: No     Mobility  Bed Mobility  General bed mobility comments: Pt was seated EOB upon arrival. min assist to progress BLEs into bed for chest X ray after session.    Transfers Overall transfer level: Needs assistance Equipment used: Rolling walker (2 wheels) Transfers: Sit to/from Stand Sit to Stand: Supervision  General  transfer comment: no physical assistance required to stand from EOB or recliner surface    Ambulation/Gait Ambulation/Gait assistance: Supervision Gait Distance (Feet): 120 Feet Assistive device: Rolling walker (2 wheels) Gait Pattern/deviations: Step-through pattern  General Gait Details: Pt tolerated ambulation 1 x 50 then 1 x 120. sao2 > 91% on 2 L o2. continues to have wheezing and slight SOB but overall slightly improved SOB with much improved gait tolerance. cogestive cough noted however due to rib fx, unable to clear due to pain    Balance Overall balance assessment: Needs assistance Sitting-balance support: Feet supported Sitting balance-Leahy Scale: Good     Standing balance support: Bilateral upper extremity supported, During functional activity, Reliant on assistive device for balance Standing balance-Leahy Scale: Fair Standing balance comment: fair without UE support, good with use of RW during dynamic activity.      Communication Communication Communication: No apparent difficulties  Cognition Arousal: Alert Behavior During Therapy: WFL for tasks assessed/performed   PT - Cognitive impairments: No apparent impairments      PT - Cognition Comments: Pt is A and O x 4 Following commands: Intact      Cueing Cueing Techniques: Verbal cues, Tactile cues         Pertinent Vitals/Pain Pain Assessment Pain Assessment: 0-10 Pain Score: 5  Pain Location: right flank Pain Descriptors / Indicators: Discomfort Pain Intervention(s): Limited activity within patient's tolerance, Monitored during session, Premedicated before session, Repositioned     PT Goals (current goals can now be found in the care plan section) Acute Rehab PT Goals Patient Stated Goal: to  go home Progress towards PT goals: Progressing toward goals    Frequency    Min 2X/week       AM-PAC PT 6 Clicks Mobility   Outcome Measure  Help needed turning from your back to your side while in a  flat bed without using bedrails?: A Little Help needed moving from lying on your back to sitting on the side of a flat bed without using bedrails?: A Little Help needed moving to and from a bed to a chair (including a wheelchair)?: A Little Help needed standing up from a chair using your arms (e.g., wheelchair or bedside chair)?: A Little Help needed to walk in hospital room?: A Little Help needed climbing 3-5 steps with a railing? : A Little 6 Click Score: 18    End of Session Equipment Utilized During Treatment: Oxygen  (2L o2 throughout session.) Activity Tolerance: Patient tolerated treatment well;Patient limited by fatigue Patient left: in bed (x ray techs in room to do chest xray) Nurse Communication: Mobility status PT Visit Diagnosis: Muscle weakness (generalized) (M62.81);Unsteadiness on feet (R26.81)     Time: 9055-8987 PT Time Calculation (min) (ACUTE ONLY): 28 min  Charges:    $Gait Training: 8-22 mins $Therapeutic Activity: 8-22 mins PT General Charges $$ ACUTE PT VISIT: 1 Visit                    Rankin Essex PTA 04/23/24, 10:27 AM

## 2024-04-23 NOTE — Plan of Care (Signed)

## 2024-04-26 ENCOUNTER — Telehealth: Payer: Self-pay

## 2024-04-26 NOTE — Transitions of Care (Post Inpatient/ED Visit) (Signed)
 04/26/2024  Name: Nancy Merritt MRN: [id number] DOB: 02-Jan-1941  Today's TOC FU Call Status: Today's TOC FU Call Status:: Successful TOC FU Call Completed TOC FU Call Complete Date: 04/26/24  Patient's Name and Date of Birth confirmed. Name, DOB (HIPAA verified by daughter/ dpr Nancy Merritt)-  Assessment completed with patients daughter Nancy Merritt.   Transition Care Management Follow-up Telephone Call Date of Discharge: 04/23/24 Discharge Facility: [facility]) Type of Discharge: Inpatient Admission Primary Inpatient Discharge Diagnosis:: Pneumothorax How have you been since you were released from the hospital?: Better Any questions or concerns?: Yes Patient Questions/Concerns:: Daughter states patient was told she would have physical therapy at home. She states the patient has not received a call regarding this service starting. Patient Questions/Concerns Addressed: Other: (Call and message sent to [facility] by this RN case manager requesting return call.)  Items Reviewed: Did you receive and understand the discharge instructions provided?: Yes Medications obtained,verified, and reconciled?: Yes (Medications Reviewed) Any new allergies since your discharge?: No Dietary orders reviewed?: Yes Type of Diet Ordered:: low salt heart healthy Do you have support at home?: Yes People in Home [RPT]: child(ren), adult Name of Support/Comfort Primary Source: Nancy Merritt  Medications Reviewed Today: Medications Reviewed Today     Reviewed by Masayoshi Couzens E, RN (Registered Nurse) on 04/26/24 at 1010  Med List Status: <None>   Medication Order Taking? Sig Documenting Provider Last Dose Status Informant  acetaminophen  (TYLENOL ) 500 MG tablet [id number] Yes Take 1 tablet (500 mg total) by mouth 3 (three) times daily as needed for moderate pain. Rilla Baller, MD  Active Self           Med Note NIKKI, MARIA   Sat Dec 27, 2023  2:44 PM) prn  albuterol  (VENTOLIN   HFA) 108 (90 Base) MCG/ACT inhaler [id number] Yes INHALE 2 PUFFS INTO THE LUNGS EVERY 4 HOURS AS NEEDED FOR WHEEZING OR SHORTNESS OF BREATH. Rilla Baller, MD  Active Self           Med Note NIKKI, MARIA   Sat Dec 27, 2023  2:45 PM) prn  arformoterol  (BROVANA ) 15 MCG/2ML NEBU [id number] Yes Take 2 mLs (15 mcg total) by nebulization 2 (two) times daily. Isaiah Scrivener, MD  Active Self  aspirin  81 MG chewable tablet [id number] Yes Chew 1 tablet (81 mg total) by mouth daily. Meng, Hao, [place]  Active Self  azithromycin  (ZITHROMAX ) 500 MG tablet [id number] Yes Take 1 tablet (500 mg total) by mouth daily. Jens Durand, MD  Active   budesonide  (PULMICORT ) 0.5 MG/2ML nebulizer solution [id number] Yes Take 2 mLs (0.5 mg total) by nebulization 2 (two) times daily. Kasa, Kurian, MD  Active Self  carvedilol  (COREG ) 6.25 MG tablet [id number] Yes TAKE 1 TABLET BY MOUTH 2 TIMES DAILY WITH A MEAL. Meng, Hao, [place]  Active Self  Cholecalciferol  (VITAMIN D3) 25 MCG (1000 UT) capsule [id number] Yes Take 2 capsules (2,000 Units total) by mouth daily. Rilla Baller, MD  Active Self  clopidogrel  (PLAVIX ) 75 MG tablet [id number] Yes Take 1 tablet (75 mg total) by mouth daily with breakfast. Okey Vina GAILS, MD  Active Self  ezetimibe  (ZETIA ) 10 MG tablet [id number] Yes TAKE 1 TABLET BY MOUTH EVERY DAY Meng, Hao, [place]  Active Self  fluticasone  (FLONASE ) 50 MCG/ACT nasal spray [id number] Yes Place 2 sprays into both nostrils daily. Rilla Baller, MD  Active Self  hydroxyurea  (HYDREA ) 500 MG capsule [id number] Yes Take 3 capsules (1,500 mg total) by mouth daily. May take with  food to minimize GI side effects. Jacobo Evalene PARAS, MD  Active Self  loratadine  (CLARITIN ) 10 MG tablet [id number] Yes Take 10 mg by mouth daily. [provider]  Active Self  Multiple Vitamin (MULTIVITAMIN) tablet [id number] Yes Take 1 tablet by mouth daily. [provider]  Active Self  Multiple Vitamins-Minerals (PRESERVISION AREDS 2) CAPS  [id number] Yes Take 2 each by mouth daily at 12 noon. [provider]  Active Self  nitroGLYCERIN  (NITROSTAT ) 0.4 MG SL tablet [id number] Yes Place 1 tablet (0.4 mg total) under the tongue every 5 (five) minutes as needed for chest pain. Janene Boer, [place]  Active Self           Med Note NIKKI, [place]   Dju Dec 27, 2023  2:45 PM) prn  oxyCODONE  (OXY IR/ROXICODONE ) 5 MG immediate release tablet [id number] Yes Take 1 tablet (5 mg total) by mouth every 12 (twelve) hours as needed for moderate pain (pain score 4-6). Jens Durand, MD  Active   predniSONE  (DELTASONE ) 20 MG tablet [id number] Yes Take 1 tablet (20 mg total) by mouth daily with breakfast for 3 days. Jens Durand, MD  Active   revefenacin  (YUPELRI ) 175 MCG/3ML nebulizer solution [id number]  Take 3 mLs (175 mcg total) by nebulization daily.  Patient not taking: Reported on 04/26/2024   Jens Durand, MD  Active   roflumilast  (DALIRESP ) 500 MCG TABS tablet [id number] Yes TAKE 1 TABLET BY MOUTH DAILY Kasa, Kurian, MD  Active Self  rosuvastatin  (CRESTOR ) 20 MG tablet [id number] Yes Take 1 tablet (20 mg total) by mouth at bedtime. Meng, Hao, [place]  Active Self  sacubitril -valsartan  (ENTRESTO ) 24-26 MG [id number] Yes Take 1 tablet by mouth 2 (two) times daily. Jens Durand, MD  Active   sertraline  (ZOLOFT ) 100 MG tablet [id number] Yes Take 1 tablet (100 mg total) by mouth daily. Rilla Baller, MD  Active Self            Home Care and Equipment/Supplies: Were Home Health Services Ordered?: Yes [facility] of [facility] Health Agency[facility] Has Agency set up a time to come to your home?: No EMR reviewed for Home Health Orders: Orders present/patient has not received call (refer to CM for follow-up) Any new equipment or medical supplies ordered?: No  Functional Questionnaire: Do you need assistance with bathing/showering or dressing?: No Do you need assistance with meal preparation?: No Do you need assistance with eating?: No Do  you have difficulty maintaining continence: No Do you need assistance with getting out of bed/getting out of a chair/moving?: No Do you have difficulty managing or taking your medications?: No  Follow up appointments reviewed: PCP Follow-up appointment confirmed?: No (daughter states patient will call an schedule a hospital follow up call with her primary care provider today.) Specialist Hospital Follow-up appointment confirmed?: Yes Date of Specialist follow-up appointment?: 04/29/24 Follow-Up Specialty Provider:: Dr. Isaiah Do you need transportation to your follow-up appointment?: No Do you understand care options if your condition(s) worsen?: Yes-patient verbalized understanding  SDOH Interventions Today    Flowsheet Row Most Recent Value  SDOH Interventions   Food Insecurity Interventions Intervention Not Indicated  Housing Interventions Intervention Not Indicated  Transportation Interventions Intervention Not Indicated  Utilities Interventions Intervention Not Indicated   Discussed and offered 30 day TOC program.  Patient's daughter  Nancy Merritt declined services for patient.  The patient has been provided with contact information for the care management team and has been advised to call with any health -related questions or  concerns.  The patient verbalized understanding with current plan of care.  The patient is directed to their insurance card regarding availability of benefits coverage.    Arvin Seip RN, BSN, CCM [employer], Population Health Case Manager Phone: [phone]

## 2024-04-26 NOTE — Patient Instructions (Signed)
 Visit Information  Thank you for taking time to visit with me today. Please don't hesitate to contact me if I can be of assistance to you   Patient instructions: eport signs of infection to the doctor as soon as noted as well as pain unrelieved by prescribed pain medication. .  take prescribed antibiotics to completion.   seek emergency medical services for severe symptoms such as SOB/ chest pain.  Notify provider for any new/ ongoing symptoms Call and scheduled hospital follow up visit with your primary care provider.    Patient verbalizes understanding of instructions and care plan provided today and agrees to view in MyChart. Active MyChart status and patient understanding of how to access instructions and care plan via MyChart confirmed with patient.     The patient has been provided with contact information for the care management team and has been advised to call with any health related questions or concerns.   Please call the care guide team at [phone] if you need to cancel or reschedule your appointment.   Please call the Suicide and Crisis Lifeline: 988 call the [place]  National Suicide Prevention Lifeline: 703-032-8551 or TTY: (425)544-5640 TTY 336-183-5554) to talk to a trained counselor call 1-800-273-TALK (toll free, 24 hour hotline) if you are experiencing a Mental Health or Behavioral Health Crisis or need someone to talk to.  Arvin Seip RN, BSN, CCM [employer], Population Health Case Manager Phone: [phone]

## 2024-04-27 ENCOUNTER — Telehealth: Payer: Self-pay

## 2024-04-27 ENCOUNTER — Telehealth: Payer: Self-pay | Admitting: Family Medicine

## 2024-04-27 MED ORDER — TRAMADOL HCL 50 MG PO TABS: 25.0000 mg | ORAL_TABLET | Freq: Two times a day (BID) | ORAL | 0 refills | Status: DC | PRN

## 2024-04-27 NOTE — Telephone Encounter (Signed)
 Plz notify I have sent in tramadol  for her to try 1/2-1 tablet at a time as needed for pain control. We can review at appt on Friday.

## 2024-04-27 NOTE — Telephone Encounter (Signed)
 Last OV: 08/15/23 Pending OV: 04/30/24 Medication: Oxycodone  5mg  Directions: 1 tablet q12h prn Last Refill: 04/23/24 - filled by [facility] Qty: #6 with 0 refills

## 2024-04-27 NOTE — Transitions of Care (Post Inpatient/ED Visit) (Signed)
 04/27/2024  [name], female   DOB: 04/17/1941, 83 y.o.   MRN: [id number]  Received a return call from daughter/ dpr [name].  Daughter states patient received call from the home health agency and they are scheduled for a start of care date on 04/30/24.  Daughter voiced appreciation for this follow up call.  Denies any further needs or concerns at this time for patient.   Arvin Seip RN, BSN, CCM [employer], Population Health Case Manager Phone: [phone]

## 2024-04-27 NOTE — Telephone Encounter (Signed)
 Copied from CRM #[id number]. Topic: Clinical - Medication Question >> Apr 27, 2024 12:13 PM Ashley R wrote: Reason for CRM: oxyCODONE  (OXY IR/ROXICODONE ) 5 MG immediate release tablet given at discharge from hosptial. Running out today , requesting something different for pain and was informed to contact PCP. Scheduled hospital F/U for Friday,  12/12

## 2024-04-27 NOTE — Telephone Encounter (Signed)
 Copied from CRM #[id number]. Topic: Clinical - Medical Advice >> Apr 27, 2024 10:20 AM Leila C wrote: Reason for CRM: Patient's child [name] [phone] states patient was in [facility] 04/19/24 is running out of Oxyocodone, patient does not want refill of Oxycodone  but needs a different type of pain medicine. Patient has an appointment with Dr. Isaiah 04/29/24. Please advise and call back.   [id number] [place], [place] - [address] [place] [address] Phone:[phone]:[phone]

## 2024-04-27 NOTE — Telephone Encounter (Signed)
 Per secure chat with Dr. Isaiah, she will need to reach out to her PCP for more pain medication.  I notified the patient's daughter (DPR).  Nothing further needed.

## 2024-04-27 NOTE — Telephone Encounter (Signed)
 Per chart review pulmonology advised she reach out to pcp.

## 2024-04-27 NOTE — Addendum Note (Signed)
 Addended by: RILLA BALLER on: 04/27/2024 05:22 PM   Modules accepted: Orders

## 2024-04-27 NOTE — Telephone Encounter (Signed)
Replied via other message.  

## 2024-04-28 NOTE — Telephone Encounter (Signed)
 Called patient daughter ( on dpr) reviewed all information and repeated back to me. Will call if any questions.

## 2024-04-29 ENCOUNTER — Encounter: Payer: Self-pay | Admitting: Internal Medicine

## 2024-04-29 ENCOUNTER — Ambulatory Visit: Admitting: Internal Medicine

## 2024-04-29 VITALS — BP 100/60 | HR 70 | Temp 97.7°F | Ht 59.0 in | Wt 136.4 lb

## 2024-04-29 DIAGNOSIS — J9611 Chronic respiratory failure with hypoxia: Secondary | ICD-10-CM | POA: Diagnosis not present

## 2024-04-29 DIAGNOSIS — Z87891 Personal history of nicotine dependence: Secondary | ICD-10-CM | POA: Diagnosis not present

## 2024-04-29 DIAGNOSIS — J479 Bronchiectasis, uncomplicated: Secondary | ICD-10-CM | POA: Diagnosis not present

## 2024-04-29 DIAGNOSIS — R0602 Shortness of breath: Secondary | ICD-10-CM

## 2024-04-29 DIAGNOSIS — J449 Chronic obstructive pulmonary disease, unspecified: Secondary | ICD-10-CM | POA: Diagnosis not present

## 2024-04-29 DIAGNOSIS — J471 Bronchiectasis with (acute) exacerbation: Secondary | ICD-10-CM

## 2024-04-29 MED ORDER — PREDNISONE 20 MG PO TABS: 20.0000 mg | ORAL_TABLET | Freq: Every morning | ORAL | 0 refills | Status: AC

## 2024-04-29 NOTE — Progress Notes (Signed)
 [facility] Pulmonary Medicine Consultation      Date: 04/29/2024,   MRN# [id number] Nancy Merritt 1940-10-17  TESTS Pulmonary function testing 2017 Postbronchodilator FEV1 FVC ratio was 63% predicted FEV1 was 73% predicted Positive bronchodilator response at 21% FVC is 86% predicted TLC was 98% predicted RV 107% predicted Expiratory limb on flow volume loops show significant obstructive pattern Findings are concerning for moderate obstructive lung disease  Ambulatory pulse ox shows progressive dyspnea exertion with hypoxia in the office at 83%, placed on oxygen  therapy   Chest x-ray August 27, 2023 No evidence of pneumonia No evidence of edema No effusions Consider hyperexpanded lung possible flattened diaphragms   CHIEF COMPLAINT:   Follow-up assessment for shortness of breath Assessment of chronic bronchitis wheezing bronchiectasis bilaterally Follow-up assessment for chronic hypoxic respiratory failure  Hospital follow-up recent admission for pneumothorax   HISTORY OF PRESENT ILLNESS   December 2025 right-sided rib pain following a fall at home.  She missed a step and fell in her bedroom.  She said if she fell on her right side against the nightstand.  She was found to have right-sided rib fractures with moderate right hydropneumothorax.  Chest tube was placed in the right pleural space by ED physician.  She was admitted to the hospital for further management. Chest tube removed without any difficulty  Patient was placed on oxygen  therapy at our last office visit Patient continues to wear oxygen  as prescribed  Progressive shortness of breath Symptoms have improved with Daliresp  500 mg daily GI side effects seems to be tolerable Due to her traumatic incident patient has increased cough Has not been using vest therapy Recommend restarting vest therapy as tolerated Patient using tramadol  for pain   I recommend continuing Smart vest therapy 15 minutes twice a  day This has helped her mucus production clearance Patient tolerating well, but on hold for now due top rib pain and coughing   PAST MEDICAL HISTORY   Past Medical History:  Diagnosis Date   Actinic keratosis    Arthritis    Asthma    Basal cell carcinoma 08/18/2023   right medial pretibial, [facility]   Cancer (HCC) 1990   CHF (congestive heart failure) (HCC)    COPD (chronic obstructive pulmonary disease) (HCC)    Coronary artery disease    COVID-19 06/14/2020   Depression    Hx of breast cancer 04/07/2018   1990. No screening in last 25 years   Hypertension      SURGICAL HISTORY   Past Surgical History:  Procedure Laterality Date   BREAST SURGERY Left 1990   CORONARY ARTERY BYPASS GRAFT     JOINT REPLACEMENT Right    Hip   LEFT HEART CATH AND CORS/GRAFTS ANGIOGRAPHY N/A 06/05/2022   Procedure: LEFT HEART CATH AND CORS/GRAFTS ANGIOGRAPHY;  Surgeon: Darron Deatrice LABOR, MD;  Location: [facility] INVASIVE CV LAB;  Service: Cardiovascular;  Laterality: N/A;   OVARIAN CYST REMOVAL  1970   TUBAL LIGATION  1970     FAMILY HISTORY   Family History  Problem Relation Age of Onset   Cancer Father        lung   Heart attack Father 76   Alcohol abuse Father    Arthritis Father    COPD Father    Diabetes Father    Hypertension Father    Breast cancer Sister 73   Arthritis Sister    Cancer Sister    COPD Sister    Drug abuse Sister    Hypertension  Sister    Miscarriages / Stillbirths Sister    Thyroid  disease Sister    Arthritis Mother    COPD Mother    Diabetes Mother    Hearing loss Mother    Hypertension Mother    Miscarriages / Stillbirths Mother    Stroke Mother    Alcohol abuse Brother    Arthritis Brother    Hypertension Brother    Arthritis Daughter    Asthma Daughter    Depression Daughter    Hypertension Daughter    Miscarriages / Stillbirths Daughter    Thyroid  disease Daughter    Arthritis Maternal Grandmother    Hypertension Maternal Grandmother    Stroke  Maternal Grandmother    Thyroid  disease Maternal Grandmother    Alcohol abuse Maternal Grandfather    Arthritis Maternal Grandfather    Cancer Maternal Grandfather    Depression Maternal Grandfather    Hearing loss Maternal Grandfather    Hypertension Maternal Grandfather    Arthritis Sister    Thyroid  disease Sister    Thyroid  disease Daughter    Hypertension Daughter    Depression Daughter    COPD Daughter    Alcohol abuse Daughter    Arthritis Daughter    Arthritis Daughter    COPD Daughter    Depression Daughter    Hearing loss Daughter    Hypertension Daughter    Thyroid  disease Daughter      SOCIAL HISTORY   Social History   Tobacco Use   Smoking status: Former    Current packs/day: 0.00    Average packs/day: 1 pack/day for 40.0 years (40.0 ttl pk-yrs)    Types: Cigarettes    Start date: 18    Quit date: 2002    Years since quitting: 23.9   Smokeless tobacco: Never  Vaping Use   Vaping status: Never Used  Substance Use Topics   Alcohol use: Not Currently   Drug use: Never     MEDICATIONS    Home Medication:  Current Outpatient Rx   Order #: [id number] Class: Historical Med   Order #: [id number] Class: OTC   Order #: [id number] Class: Normal   Order #: [id number] Class: Normal   Order #: [id number] Class: OTC   Order #: [id number] Class: Normal   Order #: [id number] Class: Normal   Order #: [id number] Class: Normal   Order #: [id number] Class: OTC   Order #: [id number] Class: Normal   Order #: [id number] Class: Normal   Order #: [id number] Class: Normal   Order #: [id number] Class: Normal   Order #: [id number] Class: Historical Med   Order #: [id number] Class: Historical Med   Order #: [id number] Class: Historical Med   Order #: [id number] Class: Normal   Order #: [id number] Class: Normal   Order #: [id number] Class: Normal   Order #: [id number] Class: Normal   Order #: [id number] Class: No Print   Order #: [id number] Class: Normal   Order #: [id number] Class: Normal    Current  Medication:  Current Outpatient Medications:    predniSONE  (DELTASONE ) 20 MG tablet, Take 20 mg by mouth every morning., Disp: , Rfl:    acetaminophen  (TYLENOL ) 500 MG tablet, Take 1 tablet (500 mg total) by mouth 3 (three) times daily as needed for moderate pain., Disp: , Rfl:    albuterol  (VENTOLIN  HFA) 108 (90 Base) MCG/ACT inhaler, INHALE 2 PUFFS INTO THE LUNGS EVERY 4 HOURS AS NEEDED FOR WHEEZING OR SHORTNESS OF BREATH., Disp: 6.7 each, Rfl: 3   arformoterol  (BROVANA ) 15 MCG/2ML NEBU, Take 2 mLs (15 mcg total) by nebulization 2 (two) times daily., Disp: 120 mL,  Rfl: 12   aspirin  81 MG chewable tablet, Chew 1 tablet (81 mg total) by mouth daily., Disp: , Rfl:    azithromycin  (ZITHROMAX ) 500 MG tablet, Take 1 tablet (500 mg total) by mouth daily., Disp: 3 tablet, Rfl: 0   budesonide  (PULMICORT ) 0.5 MG/2ML nebulizer solution, Take 2 mLs (0.5 mg total) by nebulization 2 (two) times daily., Disp: 1440 mL, Rfl: 0   carvedilol  (COREG ) 6.25 MG tablet, TAKE 1 TABLET BY MOUTH 2 TIMES DAILY WITH A MEAL., Disp: 180 tablet, Rfl: 3   Cholecalciferol  (VITAMIN D3) 25 MCG (1000 UT) capsule, Take 2 capsules (2,000 Units total) by mouth daily., Disp: 30 capsule, Rfl:    clopidogrel  (PLAVIX ) 75 MG tablet, Take 1 tablet (75 mg total) by mouth daily with breakfast., Disp: 90 tablet, Rfl: 3   ezetimibe  (ZETIA ) 10 MG tablet, TAKE 1 TABLET BY MOUTH EVERY DAY, Disp: 90 tablet, Rfl: 3   fluticasone  (FLONASE ) 50 MCG/ACT nasal spray, Place 2 sprays into both nostrils daily., Disp: 16 g, Rfl: 12   hydroxyurea  (HYDREA ) 500 MG capsule, Take 3 capsules (1,500 mg total) by mouth daily. May take with food to minimize GI side effects., Disp: 270 capsule, Rfl: 2   loratadine  (CLARITIN ) 10 MG tablet, Take 10 mg by mouth daily., Disp: , Rfl:    Multiple Vitamin (MULTIVITAMIN) tablet, Take 1 tablet by mouth daily., Disp: , Rfl:    Multiple Vitamins-Minerals (PRESERVISION AREDS 2) CAPS, Take 2 each by mouth daily at 12 noon., Disp: ,  Rfl:    nitroGLYCERIN  (NITROSTAT ) 0.4 MG SL tablet, Place 1 tablet (0.4 mg total) under the tongue every 5 (five) minutes as needed for chest pain., Disp: 25 tablet, Rfl: 3   revefenacin  (YUPELRI ) 175 MCG/3ML nebulizer solution, Take 3 mLs (175 mcg total) by nebulization daily. (Patient not taking: Reported on 04/26/2024), Disp: 90 mL, Rfl: 0   roflumilast  (DALIRESP ) 500 MCG TABS tablet, TAKE 1 TABLET BY MOUTH DAILY, Disp: 90 tablet, Rfl: 3   rosuvastatin  (CRESTOR ) 20 MG tablet, Take 1 tablet (20 mg total) by mouth at bedtime., Disp: 90 tablet, Rfl: 3   sacubitril -valsartan  (ENTRESTO ) 24-26 MG, Take 1 tablet by mouth 2 (two) times daily., Disp: , Rfl:    sertraline  (ZOLOFT ) 100 MG tablet, Take 1 tablet (100 mg total) by mouth daily., Disp: 90 tablet, Rfl: 4   traMADol  (ULTRAM ) 50 MG tablet, Take 0.5-1 tablets (25-50 mg total) by mouth every 12 (twelve) hours as needed for up to 5 days., Disp: 10 tablet, Rfl: 0    ALLERGIES   Advair diskus [fluticasone -salmeterol], Bee pollen, Budesonide -formoterol fumarate, Levofloxacin, Pollen extract, Qvar [beclomethasone], and Singulair [montelukast]  BP 100/60   Pulse 70   Temp 97.7 F (36.5 C)   Ht 4' 11 (1.499 m)   Wt 136 lb 6.4 oz (61.9 kg)   SpO2 95% Comment: on 2L of O2  BMI 27.55 kg/m   Physical Examination:  General Appearance: No distress  EYES EOM intact.   NECK Supple, No JVD Pulmonary: normal breath sounds, +wheezing.  CardiovascularNormal S1,S2.  No m/r/g.   Ext pulses intact, cap refill intact  ALL OTHER ROS ARE NEGATIVE     CT chest January 2025 reviewed in detail There is evidence of bilateral bronchiectasis This could be contributing to her symptoms      ASSESSMENT/PLAN   83 year old pleasant white female seen today for follow-up assessment for COPD with moderate obstructive pulmonary disease recurrent bouts of COPD exacerbation requiring multiple doses of prednisone  antibiotics  also with hypoxic respiratory failure  continuing to show signs of chronic bronchitis and underlying bilateral bronchiectasis with a diminished inspiratory capacity with respiratory insufficiency, recent admission for traumatic pneumothorax and rib fracture    Assessment COPD Progression of moderate COPD Respiratory insufficiency with inability to complete pulmonary function testing Poor respiratory effort and inspiratory capacity Patient unable to tolerate traditional inhaler therapy Continue Pulmicort  and Brovana  nebulizers, Yupelri  DuoNebs every 6 hours Recommend restarting neb therapy as tolerated Patient has been noncompliant due to rib pain   Respiratory insufficiency chronic mucus production Flutter valve 10-15 times per day No evidence of infection Continue Daliresp  500 mg Continue vest therapy 15 minutes twice a day as tolerated Patient states that her breathing is much improved and her mucus production and clearance has improved Patient has not been able to use her vest therapy due to rib pain Patient will try to use as tolerated   Chronic respiratory sufficiency chronic bronchitis and bronchiectasis Patient with ongoing symptoms Continue chest therapy May consider alternative therapy   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 All questions answered   Follow up 3 months   I spent a total of 45 minutes dedicated to the care of this patient on the date of this encounter to include pre-visit review of records,  face-to-face time with the patient discussing conditions above, post visit ordering of testing, clinical documentation with the electronic health record, making appropriate referrals as documented, and communicating necessary information to the patient's healthcare team.    The Patient requires high complexity decision making for assessment and support, frequent evaluation and titration of therapies, application of advanced monitoring technologies and extensive interpretation of multiple databases.  Patient satisfied with Plan of action and management. All questions answered    Nickolas Alm Cellar, M.D.  [facility] Pulmonary & Critical Care Medicine  Medical Director [facility]

## 2024-04-29 NOTE — Patient Instructions (Signed)
 Please restart nebulizers as prescribed Can use tramadol  as needed for pain Can try vest therapy as tolerated Continue oxygen  as prescribed Prednisone  20 mg daily for 7 days  Avoid Allergens and Irritants Avoid secondhand smoke Avoid SICK contacts Recommend  Masking  when appropriate Recommend Keep up-to-date with vaccinations

## 2024-04-30 ENCOUNTER — Ambulatory Visit: Admitting: Family Medicine

## 2024-04-30 ENCOUNTER — Encounter: Payer: Self-pay | Admitting: Family Medicine

## 2024-04-30 VITALS — BP 150/78 | HR 70 | Temp 97.6°F | Ht 59.0 in | Wt 138.0 lb

## 2024-04-30 DIAGNOSIS — I10 Essential (primary) hypertension: Secondary | ICD-10-CM

## 2024-04-30 DIAGNOSIS — I5042 Chronic combined systolic (congestive) and diastolic (congestive) heart failure: Secondary | ICD-10-CM

## 2024-04-30 DIAGNOSIS — J9621 Acute and chronic respiratory failure with hypoxia: Secondary | ICD-10-CM

## 2024-04-30 DIAGNOSIS — H6123 Impacted cerumen, bilateral: Secondary | ICD-10-CM

## 2024-04-30 DIAGNOSIS — J411 Mucopurulent chronic bronchitis: Secondary | ICD-10-CM

## 2024-04-30 DIAGNOSIS — Z23 Encounter for immunization: Secondary | ICD-10-CM

## 2024-04-30 DIAGNOSIS — S270XXA Traumatic pneumothorax, initial encounter: Secondary | ICD-10-CM

## 2024-04-30 DIAGNOSIS — J441 Chronic obstructive pulmonary disease with (acute) exacerbation: Secondary | ICD-10-CM

## 2024-04-30 MED ORDER — TRAMADOL HCL 50 MG PO TABS: 25.0000 mg | ORAL_TABLET | Freq: Three times a day (TID) | ORAL | 0 refills | Status: AC | PRN

## 2024-04-30 MED ORDER — TRAMADOL HCL 50 MG PO TABS: 25.0000 mg | ORAL_TABLET | Freq: Three times a day (TID) | ORAL | 0 refills | Status: DC | PRN

## 2024-04-30 NOTE — Progress Notes (Signed)
 Ph: [phone] Fax: [phone]   [name], female    DOB: [birth year], 83 y.o.   MRN: [id number]  This visit was conducted in person.  BP (!) 150/78 (BP Location: Right Arm, Cuff Size: Normal)   Pulse 70   Temp 97.6 F (36.4 C) (Oral)   Ht 4' 11 (1.499 m)   Wt 138 lb (62.6 kg)   SpO2 96%   PF (!) 2 L/min   BMI 27.87 kg/m   BP Readings from Last 3 Encounters:  04/30/24 (!) 150/78  04/29/24 100/60  04/23/24 136/69   CC: hospital f/u visit  Subjective:   HPI: Nancy Merritt is a 83 y.o. female presenting on 04/30/2024 for Hospitalization Follow-up ([facility] FU, pt admitted at  for Pneumothorax//Pt said she feel, broke ribs and punctured lung, pt is out of breath, breath shortens when walking//Pt has 7/10 pain and would like to discuss pain management/Nancy Merritt, son in law in room with pt//FYI: Insurance will no longer approve Entresto )   Nancy Merritt has a past medical history of Actinic keratosis, Arthritis, Asthma, Basal cell carcinoma (08/18/2023), Cancer (HCC) (1990), CHF (congestive heart failure) (HCC), COPD (chronic obstructive pulmonary disease) (HCC), Coronary artery disease, COVID-19 (06/14/2020), Depression, breast cancer (04/07/2018), and Hypertension.   Recent hospitalization for traumatic pneumothorax after fall at home - miss-stepped in her bedroom, landed right chest onto nightstand s/p R posterior 6/7th rib fractures.  Hospital records reviewed. Med rec performed.  Treated with chest tube placement in ER.  Found to have COPD exacerbation treated with prednisone  and azithromycin . Yupelri  (revefenacin ) was added to COPD regimen in addition to daliresp , arformoterol  neb and pulmicort  neb. Continued 2L supplemental oxygen  for chronic hypoxic resp failure.   Oxycodone  was not tolerated well due to woozy, loopy feling. Most recently prescribed tramadol  #10 on 04/27/2024. Notes tramadol  is not as effective.  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  Continues seeing hematology Nancy Merritt) for ET.   HTN - on carvedilol  6.25mg  twice daily with breakfast - hasn't yet taken today. She also takes Entresto  BID - this will need to be changed to generic in the new year. Home BPs running overall well controlled.   [facility].  Other follow up appointments scheduled: saw Dr Isaiah pulmonology yesterday, note reviewed. Recommend smart vest therapy 15 min BID to help expectorate mucous (once rib pain better). Unable to complete f/u pulmonary function testing. Continue current regimen including duonebs Q6 hours. Prednisone  was extended. ______________________________________________________________________ Hospital admission: 04/19/2024 Hospital discharge: 04/23/2024 TCM f/u phone call:  performed on 04/26/2024  Recommendations at discharge:  Follow-up with Dr. Isaiah, pulmonologist on 04/29/2024. Follow-up with PCP in 1 to 2 weeks   Discharge Diagnoses: Principal Problem:   Pneumothorax Active Problems:   COPD with acute exacerbation (HCC)   Acute on chronic respiratory failure with hypoxia (HCC)     Relevant past medical, surgical, family and social history reviewed and updated as indicated. Interim medical history since our last visit reviewed. Allergies and medications reviewed and updated. Outpatient Medications Prior to Visit  Medication Sig Dispense Refill   acetaminophen  (TYLENOL ) 500 MG tablet Take 1 tablet (500 mg total) by mouth 3 (three) times daily as needed for moderate pain.     albuterol  (VENTOLIN  HFA) 108 (90 Base) MCG/ACT inhaler INHALE 2 PUFFS INTO THE LUNGS EVERY 4 HOURS AS NEEDED FOR WHEEZING OR SHORTNESS OF BREATH. 6.7 each 3   arformoterol  (BROVANA )  15 MCG/2ML NEBU Take 2 mLs (15 mcg total) by nebulization 2 (two) times daily. 120 mL 12   aspirin  81 MG chewable tablet Chew 1 tablet (81 mg total) by mouth daily.     azithromycin  (ZITHROMAX ) 500 MG  tablet Take 1 tablet (500 mg total) by mouth daily. 3 tablet 0   budesonide  (PULMICORT ) 0.5 MG/2ML nebulizer solution Take 2 mLs (0.5 mg total) by nebulization 2 (two) times daily. 1440 mL 0   carvedilol  (COREG ) 6.25 MG tablet TAKE 1 TABLET BY MOUTH 2 TIMES DAILY WITH A MEAL. 180 tablet 3   Cholecalciferol  (VITAMIN D3) 25 MCG (1000 UT) capsule Take 2 capsules (2,000 Units total) by mouth daily. 30 capsule    clopidogrel  (PLAVIX ) 75 MG tablet Take 1 tablet (75 mg total) by mouth daily with breakfast. 90 tablet 3   ezetimibe  (ZETIA ) 10 MG tablet TAKE 1 TABLET BY MOUTH EVERY DAY 90 tablet 3   fluticasone  (FLONASE ) 50 MCG/ACT nasal spray Place 2 sprays into both nostrils daily. 16 g 12   hydroxyurea  (HYDREA ) 500 MG capsule Take 3 capsules (1,500 mg total) by mouth daily. May take with food to minimize GI side effects. 270 capsule 2   loratadine  (CLARITIN ) 10 MG tablet Take 10 mg by mouth daily.     Multiple Vitamin (MULTIVITAMIN) tablet Take 1 tablet by mouth daily.     Multiple Vitamins-Minerals (PRESERVISION AREDS 2) CAPS Take 2 each by mouth daily at 12 noon.     nitroGLYCERIN  (NITROSTAT ) 0.4 MG SL tablet Place 1 tablet (0.4 mg total) under the tongue every 5 (five) minutes as needed for chest pain. 25 tablet 3   predniSONE  (DELTASONE ) 20 MG tablet Take 1 tablet (20 mg total) by mouth every morning for 7 days. 7 tablet 0   revefenacin  (YUPELRI ) 175 MCG/3ML nebulizer solution Take 3 mLs (175 mcg total) by nebulization daily. 90 mL 0   roflumilast  (DALIRESP ) 500 MCG TABS tablet TAKE 1 TABLET BY MOUTH DAILY 90 tablet 3   rosuvastatin  (CRESTOR ) 20 MG tablet Take 1 tablet (20 mg total) by mouth at bedtime. 90 tablet 3   sacubitril -valsartan  (ENTRESTO ) 24-26 MG Take 1 tablet by mouth 2 (two) times daily.     sertraline  (ZOLOFT ) 100 MG tablet Take 1 tablet (100 mg total) by mouth daily. 90 tablet 4   traMADol  (ULTRAM ) 50 MG tablet Take 0.5-1 tablets (25-50 mg total) by mouth every 12 (twelve) hours as  needed for up to 5 days. 10 tablet 0   No facility-administered medications prior to visit.     Per HPI unless specifically indicated in ROS section below Review of Systems  Objective:  BP (!) 150/78 (BP Location: Right Arm, Cuff Size: Normal)   Pulse 70   Temp 97.6 F (36.4 C) (Oral)   Ht 4' 11 (1.499 m)   Wt 138 lb (62.6 kg)   SpO2 96%   PF (!) 2 L/min   BMI 27.87 kg/m   Wt Readings from Last 3 Encounters:  04/30/24 138 lb (62.6 kg)  04/29/24 136 lb 6.4 oz (61.9 kg)  04/19/24 136 lb 11 oz (62 kg)      Physical Exam Vitals and nursing note reviewed.  Constitutional:      Appearance: Normal appearance. She is not ill-appearing.  Cardiovascular:     Rate and Rhythm: Normal rate and regular rhythm.     Pulses: Normal pulses.     Heart sounds: Normal heart sounds. No murmur heard. Pulmonary:  Effort: Pulmonary effort is normal. No respiratory distress.     Breath sounds: Wheezing (diffusely) present. No rhonchi.     Comments: R>L coarse crackles Musculoskeletal:     Right lower leg: No edema.     Left lower leg: No edema.  Skin:    General: Skin is warm and dry.     Findings: No rash.  Neurological:     Mental Status: She is alert.  Psychiatric:        Mood and Affect: Mood normal.        Behavior: Behavior normal.       Lab Results  Component Value Date   NA 140 04/20/2024   CL 104 04/20/2024   K 3.7 04/20/2024   CO2 25 04/20/2024   BUN 13 04/20/2024   CREATININE 0.54 04/20/2024   GFRNONAA >60 04/20/2024   CALCIUM  9.9 04/20/2024   PHOS 3.1 06/20/2020   ALBUMIN 3.3 (L) 08/27/2023   GLUCOSE 118 (H) 04/20/2024    Lab Results  Component Value Date   ALT 15 08/27/2023   AST 18 08/27/2023   ALKPHOS 79 08/27/2023   BILITOT 0.4 08/27/2023   Lab Results  Component Value Date   WBC 5.7 04/21/2024   HGB 11.5 (L) 04/21/2024   HCT 35.5 (L) 04/21/2024   MCV 105.7 (H) 04/21/2024   PLT 523 (H) 04/21/2024   Assessment & Plan:   Problem List Items  Addressed This Visit     Hypertension   Chronic, above goal today however in h/o hypotension and has not taken BP meds yet this morning, no changes indicated at this time. I did ask her to monitoring BP at home and let me know if consistently elevated.       COPD (chronic obstructive pulmonary disease) (HCC)   Continue baseline supplemental oxygenation with daily daliresp , arfomoterol and pulmicort  nebs, and recent addition of Revefenacin  anticholinergic. Appreciate pulm care.       Hearing loss of both ears due to cerumen impaction   Received R myringotomy with improved hearing noted.  Appreciate ENT care.       Chronic combined systolic and diastolic heart failure [facility])   Appreciate cardiology care, on Entresto  and carvedilol        COPD with acute exacerbation (HCC)   Currently being treated for this with extension of prednisone   She received azithromycin  in the hospital.       Traumatic fracture of ribs of right side with pneumothorax - Primary   Reviewed hospitalization records.  Traumatic pneumothorax after right posterior 6/7th rib fractures.  Pain control suboptimal - recommend scheduled tylenol  500mg  TID followed by PRN tramadol  for breakthrough pain - update with effect.  Reviewed anticipated course of improvement, recommend cushion use to chest wall with cough/sneeze etc.       Acute on chronic respiratory failure with hypoxia (HCC)   Continue supplemental oxygen  use.       Other Visit Diagnoses       Encounter for immunization       Relevant Orders   Flu vaccine HIGH DOSE PF(Fluzone Trivalent) (Completed)        Meds ordered this encounter  Medications   DISCONTD: traMADol  (ULTRAM ) 50 MG tablet    Sig: Take 0.5-1 tablets (25-50 mg total) by mouth 3 (three) times daily as needed for up to 7 days for moderate pain (pain score 4-6) (breakthrough pain).    Dispense:  20 tablet    Refill:  0   traMADol  (ULTRAM ) 50  MG tablet    Sig: Take 0.5-1 tablets (25-50  mg total) by mouth 3 (three) times daily as needed for up to 7 days for moderate pain (pain score 4-6) (breakthrough pain).    Dispense:  20 tablet    Refill:  0    Orders Placed This Encounter  Procedures   Flu vaccine HIGH DOSE PF(Fluzone Trivalent)    Patient Instructions  Flu shot today  Regarding Entresto  - Dr Okey is aware - you will need to go to generic equivalent on 05/20/2024.  For pain control - start Extra Strength tylenol  500mg  3 times a day with meals. Then use tramadol  50mg  twice daily as needed for breakthrough pain. Update me with effect. Tramadol  prescription printed out today.  BP was elevated today - take BP medicines when you get home, continue monitoring blood pressures at home and let me know if consistently >140/90.   Schedule physical after 08/14/2024.   Follow up plan: Return in about 3 months (around 08/14/2024), or if symptoms worsen or fail to improve, for annual exam, prior fasting for blood work.  Anton Blas, MD

## 2024-04-30 NOTE — Patient Instructions (Addendum)
 Flu shot today  Regarding Entresto  - Dr Okey is aware - you will need to go to generic equivalent on 05/20/2024.  For pain control - start Extra Strength tylenol  500mg  3 times a day with meals. Then use tramadol  50mg  twice daily as needed for breakthrough pain. Update me with effect. Tramadol  prescription printed out today.  BP was elevated today - take BP medicines when you get home, continue monitoring blood pressures at home and let me know if consistently >140/90.   Schedule physical after 08/14/2024.

## 2024-05-01 NOTE — Assessment & Plan Note (Signed)
Continue supplemental oxygen use.

## 2024-05-01 NOTE — Assessment & Plan Note (Signed)
 Received R myringotomy with improved hearing noted.  Appreciate ENT care.

## 2024-05-01 NOTE — Assessment & Plan Note (Signed)
 Continue baseline supplemental oxygenation with daily daliresp , arfomoterol and pulmicort  nebs, and recent addition of Revefenacin  anticholinergic. Appreciate pulm care.

## 2024-05-01 NOTE — Assessment & Plan Note (Signed)
 Reviewed hospitalization records.  Traumatic pneumothorax after right posterior 6/7th rib fractures.  Pain control suboptimal - recommend scheduled tylenol  500mg  TID followed by PRN tramadol  for breakthrough pain - update with effect.  Reviewed anticipated course of improvement, recommend cushion use to chest wall with cough/sneeze etc.

## 2024-05-01 NOTE — Assessment & Plan Note (Signed)
 Appreciate cardiology care, on Entresto  and carvedilol 

## 2024-05-01 NOTE — Assessment & Plan Note (Signed)
 Chronic, above goal today however in h/o hypotension and has not taken BP meds yet this morning, no changes indicated at this time. I did ask her to monitoring BP at home and let me know if consistently elevated.

## 2024-05-01 NOTE — Assessment & Plan Note (Signed)
 Currently being treated for this with extension of prednisone   She received azithromycin  in the hospital.

## 2024-05-07 DIAGNOSIS — S2241XD Multiple fractures of ribs, right side, subsequent encounter for fracture with routine healing: Secondary | ICD-10-CM | POA: Diagnosis not present

## 2024-05-07 DIAGNOSIS — J4489 Other specified chronic obstructive pulmonary disease: Secondary | ICD-10-CM | POA: Diagnosis not present

## 2024-05-07 DIAGNOSIS — I2510 Atherosclerotic heart disease of native coronary artery without angina pectoris: Secondary | ICD-10-CM | POA: Diagnosis not present

## 2024-05-07 DIAGNOSIS — F32A Depression, unspecified: Secondary | ICD-10-CM | POA: Diagnosis not present

## 2024-05-07 DIAGNOSIS — J439 Emphysema, unspecified: Secondary | ICD-10-CM | POA: Diagnosis not present

## 2024-05-07 DIAGNOSIS — H259 Unspecified age-related cataract: Secondary | ICD-10-CM | POA: Diagnosis not present

## 2024-05-07 DIAGNOSIS — D75839 Thrombocytosis, unspecified: Secondary | ICD-10-CM | POA: Diagnosis not present

## 2024-05-07 DIAGNOSIS — I110 Hypertensive heart disease with heart failure: Secondary | ICD-10-CM | POA: Diagnosis not present

## 2024-05-07 DIAGNOSIS — I272 Pulmonary hypertension, unspecified: Secondary | ICD-10-CM | POA: Diagnosis not present

## 2024-05-07 DIAGNOSIS — J9610 Chronic respiratory failure, unspecified whether with hypoxia or hypercapnia: Secondary | ICD-10-CM | POA: Diagnosis not present

## 2024-05-07 DIAGNOSIS — I700 Atherosclerosis of aorta: Secondary | ICD-10-CM | POA: Diagnosis not present

## 2024-05-07 DIAGNOSIS — I509 Heart failure, unspecified: Secondary | ICD-10-CM | POA: Diagnosis not present

## 2024-05-10 ENCOUNTER — Telehealth: Payer: Self-pay

## 2024-05-10 NOTE — Telephone Encounter (Signed)
 Received home health plan of care from [facility] order # [phone]. Placed in box for review with charge sheet.

## 2024-05-10 NOTE — Telephone Encounter (Signed)
 Signed and returned to Joellen.

## 2024-05-11 NOTE — Telephone Encounter (Signed)
 Fax as requested and sent to charge and scan

## 2024-05-18 ENCOUNTER — Other Ambulatory Visit: Payer: Self-pay | Admitting: Physician Assistant

## 2024-05-24 ENCOUNTER — Ambulatory Visit: Payer: Self-pay

## 2024-05-24 NOTE — Telephone Encounter (Signed)
 FYI Only or Action Required?: FYI only for provider: appointment scheduled on 1/6.  Patient was last seen in primary care on 04/30/2024 by Rilla Baller, MD.  Called Nurse Triage reporting Back Pain.  Symptoms began several weeks ago.  Interventions attempted: OTC medications: advil, tylenol , ICYHOT.  Symptoms are: gradually worsening.  Triage Disposition: See PCP When Office is Open (Within 3 Days)  Patient/caregiver understands and will follow disposition?: Yes   Copied from CRM [id number]. Topic: Clinical - Red Word Triage >> May 24, 2024 11:40 AM Antony RAMAN wrote: Red Word that prompted transfer to Nurse Triage: twisted back on 12/20 still in pain  Reason for Disposition  [1] MODERATE back pain (e.g., interferes with normal activities) AND [2] present > 3 days  Answer Assessment - Initial Assessment Questions On 12/20- patient was starting to fall and she caught herself but tweaked her back. She was not evaluated but has been working with home PT that has also been helping out with her ribs.  Pain is getting worse. Low back - daughter not sure which side.  Denies referred pain, paresthesias, new incontinence. Denies abdominal pain, CP, SOB, dizziness or fever.   Heating pad/ICYHOT/ Tylenol  and advil with mild relief.   Appt with UC made for tomorrow to assess. ED precautions advised.   FYI to Dr KANDICE:-- ribs are doing much better!   1. ONSET: When did the pain begin? (e.g., minutes, hours, days)     12/20 2. LOCATION: Where does it hurt? (upper, mid or lower back)     Lower back primarily on one side but not sure which  3. SEVERITY: How bad is the pain?  (e.g., Scale 1-10; mild, moderate, or severe)     7/10 4. PATTERN: Is the pain constant? (e.g., yes, no; constant, intermittent)      Constant  5. RADIATION: Does the pain shoot into your legs or somewhere else?     Denies  6. CAUSE:  What do you think is causing the back pain?      Tweaked it when she  caught herself from falling  7. BACK OVERUSE:  Any recent lifting of heavy objects, strenuous work or exercise?     Denies  8. MEDICINES: What have you taken so far for the pain? (e.g., nothing, acetaminophen , NSAIDS)     Ibuprofen, tyleon, icyhot, heating pd 9. NEUROLOGIC SYMPTOMS: Do you have any weakness, numbness, or problems with bowel/bladder control?     Denies  10. OTHER SYMPTOMS: Do you have any other symptoms? (e.g., fever, abdomen pain, burning with urination, blood in urine)       Denies  Protocols used: Back Pain-A-AH

## 2024-05-25 ENCOUNTER — Ambulatory Visit (INDEPENDENT_AMBULATORY_CARE_PROVIDER_SITE_OTHER)

## 2024-05-25 ENCOUNTER — Ambulatory Visit (HOSPITAL_COMMUNITY)
Admission: RE | Admit: 2024-05-25 | Discharge: 2024-05-25 | Disposition: A | Discharge status: Home / Self Care | Attending: Emergency Medicine | Admitting: Emergency Medicine

## 2024-05-25 ENCOUNTER — Encounter (HOSPITAL_COMMUNITY): Payer: Self-pay

## 2024-05-25 VITALS — BP 171/96 | HR 77 | Temp 97.6°F | Resp 18

## 2024-05-25 DIAGNOSIS — M545 Low back pain, unspecified: Secondary | ICD-10-CM | POA: Diagnosis not present

## 2024-05-25 DIAGNOSIS — M419 Scoliosis, unspecified: Secondary | ICD-10-CM | POA: Diagnosis not present

## 2024-05-25 DIAGNOSIS — M51362 Other intervertebral disc degeneration, lumbar region with discogenic back pain and lower extremity pain: Secondary | ICD-10-CM | POA: Diagnosis not present

## 2024-05-25 MED ORDER — METHOCARBAMOL 500 MG PO TABS: 500.0000 mg | ORAL_TABLET | Freq: Two times a day (BID) | ORAL | 0 refills | Status: DC

## 2024-05-25 NOTE — ED Provider Notes (Signed)
 " [facility]    CSN: [id number] Arrival date & time: 05/25/24  1041      History   Chief Complaint Chief Complaint  Patient presents with   Back Pain    Entered by patient    HPI Nancy Merritt is a 84 y.o. female.   Patient presents to clinic with her granddaughter over concern of lower back pain.  She was attempting to save herself from falling when she caught herself and tweaked her back on 05/08/24.  She is unable to describe how she saved herself or if she had anything.   Since then patient has been using Tylenol  and IcyHot patches.  The back continues to hurt with any range of motion.  The only time it does not hurt is when she is laying down in bed without movement.  Does use a rolling walker with long distances, does not use this in the house.  Is on oxygen  via nasal cannula at baseline.  History of CHF, COPD, hypertension, arthritis, asthma, NSTEMI and a traumatic fracture of ribs on the right side from a recent fall.  Has been working with PT for this.  Granddaughter asking about muscle relaxer and thinks this may help.  The history is provided by the patient, medical records and a caregiver.  Back Pain   Past Medical History:  Diagnosis Date   Actinic keratosis    Arthritis    Asthma    Basal cell carcinoma 08/18/2023   right medial pretibial, [facility]   Cancer (HCC) 1990   CHF (congestive heart failure) (HCC)    COPD (chronic obstructive pulmonary disease) (HCC)    Coronary artery disease    COVID-19 06/14/2020   Depression    Hx of breast cancer 04/07/2018   1990. No screening in last 25 years   Hypertension     Patient Active Problem List   Diagnosis Date Noted   Acute on chronic respiratory failure with hypoxia (HCC) 04/20/2024   Traumatic fracture of ribs of right side with pneumothorax 04/19/2024   COPD with acute exacerbation (HCC) 12/27/2023   Hyperlipidemia 12/27/2023   Thoracic ascending aortic aneurysm 08/18/2023   MDD (major  depressive disorder), recurrent episode, moderate (HCC) 03/14/2023   Palmar nodule, right 02/15/2023   Polypharmacy 02/14/2023   Allergic rhinitis 08/17/2022   Earlobe lesion, left 08/17/2022   Skin lesion of right leg 08/17/2022   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/2022   Carotid bruit 08/17/2022   Encounter for general adult medical examination with abnormal findings 08/13/2022   Chronic combined systolic and diastolic heart failure (HCC) 06/05/2022   NSTEMI (non-ST elevated myocardial infarction) (HCC) 06/04/2022   Prediabetes 01/31/2022   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   Hearing loss of both ears due to cerumen impaction 07/11/2020   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/18/2020   History of CVA (cerebrovascular accident) without residual deficits 05/18/2020   COPD (chronic obstructive pulmonary disease) (HCC) 08/19/2019   Advance directive discussed with patient 11/09/2018   Primary hyperparathyroidism 04/13/2018   Mild persistent asthma 04/07/2018   Hypertension 04/07/2018   Hx of breast cancer 04/07/2018   CAD (coronary artery disease) 04/07/2018    Past Surgical History:  Procedure Laterality Date   BREAST SURGERY Left 1990   CORONARY ARTERY BYPASS GRAFT     JOINT REPLACEMENT Right    Hip   LEFT HEART CATH AND CORS/GRAFTS ANGIOGRAPHY N/A 06/05/2022  Procedure: LEFT HEART CATH AND CORS/GRAFTS ANGIOGRAPHY;  Surgeon: Darron Deatrice LABOR, MD;  Location: [facility];  Service: Cardiovascular;  Laterality: N/A;   OVARIAN CYST REMOVAL  1970   TUBAL LIGATION  1970    OB History   No obstetric history on file.      Home Medications    Prior to Admission medications  Medication Sig Start Date End Date Taking? Authorizing Provider  aspirin  81 MG chewable tablet Chew 1 tablet (81 mg total) by mouth daily. 06/07/22  Yes Meng, Hao, PA  carvedilol  (COREG ) 6.25 MG tablet  TAKE 1 TABLET BY MOUTH 2 TIMES DAILY WITH A MEAL. 09/29/23  Yes Meng, Hao, PA  Cholecalciferol  (VITAMIN D3) 25 MCG (1000 UT) capsule Take 2 capsules (2,000 Units total) by mouth daily. 08/15/22  Yes Rilla Baller, MD  clopidogrel  (PLAVIX ) 75 MG tablet Take 1 tablet (75 mg total) by mouth daily with breakfast. 10/17/23  Yes Okey Vina GAILS, MD  ezetimibe  (ZETIA ) 10 MG tablet TAKE 1 TABLET BY MOUTH EVERY DAY 05/19/24  Yes Okey Vina GAILS, MD  hydroxyurea  (HYDREA ) 500 MG capsule Take 3 capsules (1,500 mg total) by mouth daily. May take with food to minimize GI side effects. 06/19/23  Yes Jacobo Evalene PARAS, MD  loratadine  (CLARITIN ) 10 MG tablet Take 10 mg by mouth daily.   Yes [provider]  methocarbamol  (ROBAXIN ) 500 MG tablet Take 1 tablet (500 mg total) by mouth 2 (two) times daily. 05/25/24  Yes Donneisha Beane  N, FNP  Multiple Vitamin (MULTIVITAMIN) tablet Take 1 tablet by mouth daily.   Yes [provider]  Multiple Vitamins-Minerals (PRESERVISION AREDS 2) CAPS Take 2 each by mouth daily at 12 noon.   Yes [provider]  roflumilast  (DALIRESP ) 500 MCG TABS tablet TAKE 1 TABLET BY MOUTH DAILY 02/05/24  Yes Kasa, Kurian, MD  rosuvastatin  (CRESTOR ) 20 MG tablet Take 1 tablet (20 mg total) by mouth at bedtime. 06/06/22  Yes Meng, Hao, PA  sacubitril -valsartan  (ENTRESTO ) 24-26 MG Take 1 tablet by mouth 2 (two) times daily. 04/23/24  Yes Jens Durand, MD  sertraline  (ZOLOFT ) 100 MG tablet Take 1 tablet (100 mg total) by mouth daily. 08/15/23  Yes Rilla Baller, MD  acetaminophen  (TYLENOL ) 500 MG tablet Take 1 tablet (500 mg total) by mouth 3 (three) times daily as needed for moderate pain. 06/11/22   Rilla Baller, MD  albuterol  (VENTOLIN  HFA) 108 [phone] Base) MCG/ACT inhaler INHALE 2 PUFFS INTO THE LUNGS EVERY 4 HOURS AS NEEDED FOR WHEEZING OR SHORTNESS OF BREATH. 11/04/23   Rilla Baller, MD  arformoterol  (BROVANA ) 15 MCG/2ML NEBU Take 2 mLs (15 mcg total) by  nebulization 2 (two) times daily. 10/15/23   Kasa, Kurian, MD  azithromycin  (ZITHROMAX ) 500 MG tablet Take 1 tablet (500 mg total) by mouth daily. 04/24/24   Jens Durand, MD  budesonide  (PULMICORT ) 0.5 MG/2ML nebulizer solution Take 2 mLs (0.5 mg total) by nebulization 2 (two) times daily. 10/15/23 10/14/24  Kasa, Kurian, MD  fluticasone  (FLONASE ) 50 MCG/ACT nasal spray Place 2 sprays into both nostrils daily. 08/15/23   Rilla Baller, MD  nitroGLYCERIN  (NITROSTAT ) 0.4 MG SL tablet Place 1 tablet (0.4 mg total) under the tongue every 5 (five) minutes as needed for chest pain. 06/06/22   Meng, Hao, PA  revefenacin  (YUPELRI ) 175 MCG/3ML nebulizer solution Take 3 mLs (175 mcg total) by nebulization daily. 04/24/24   Jens Durand, MD    Family History Family History  Problem Relation Age of Onset  Cancer Father        lung   Heart attack Father 71   Alcohol abuse Father    Arthritis Father    COPD Father    Diabetes Father    Hypertension Father    Breast cancer Sister 35   Arthritis Sister    Cancer Sister    COPD Sister    Drug abuse Sister    Hypertension Sister    Miscarriages / Stillbirths Sister    Thyroid  disease Sister    Arthritis Mother    COPD Mother    Diabetes Mother    Hearing loss Mother    Hypertension Mother    Miscarriages / Stillbirths Mother    Stroke Mother    Alcohol abuse Brother    Arthritis Brother    Hypertension Brother    Arthritis Daughter    Asthma Daughter    Depression Daughter    Hypertension Daughter    Miscarriages / Stillbirths Daughter    Thyroid  disease Daughter    Arthritis Maternal Grandmother    Hypertension Maternal Grandmother    Stroke Maternal Grandmother    Thyroid  disease Maternal Grandmother    Alcohol abuse Maternal Grandfather    Arthritis Maternal Grandfather    Cancer Maternal Grandfather    Depression Maternal Grandfather    Hearing loss Maternal Grandfather    Hypertension Maternal Grandfather    Arthritis  Sister    Thyroid  disease Sister    Thyroid  disease Daughter    Hypertension Daughter    Depression Daughter    COPD Daughter    Alcohol abuse Daughter    Arthritis Daughter    Arthritis Daughter    COPD Daughter    Depression Daughter    Hearing loss Daughter    Hypertension Daughter    Thyroid  disease Daughter     Social History Social History[1]   Allergies   Advair diskus [fluticasone -salmeterol], Bee pollen, Budesonide -formoterol fumarate, Levofloxacin, Pollen extract, Qvar [beclomethasone], and Singulair [montelukast]   Review of Systems Review of Systems  Per HPI  Physical Exam Triage Vital Signs ED Triage Vitals  Encounter Vitals Group     BP 05/25/24 1113 (!) 171/96     Girls Systolic BP Percentile --      Girls Diastolic BP Percentile --      Boys Systolic BP Percentile --      Boys Diastolic BP Percentile --      Pulse Rate 05/25/24 1113 77     Resp 05/25/24 1113 18     Temp 05/25/24 1113 97.6 F (36.4 C)     Temp Source 05/25/24 1113 Oral     SpO2 05/25/24 1113 97 %     Weight --      Height --      Head Circumference --      Peak Flow --      Pain Score 05/25/24 1111 4     Pain Loc --      Pain Education --      Exclude from Growth Chart --    No data found.  Updated Vital Signs BP (!) 171/96 (BP Location: Right Arm)   Pulse 77   Temp 97.6 F (36.4 C) (Oral)   Resp 18   SpO2 97%   Visual Acuity Right Eye Distance:   Left Eye Distance:   Bilateral Distance:    Right Eye Near:   Left Eye Near:    Bilateral Near:     Physical Exam Vitals and  nursing note reviewed.  Constitutional:      Appearance: Normal appearance.  HENT:     Head: Normocephalic and atraumatic.     Right Ear: External ear normal.     Left Ear: External ear normal.     Nose: Nose normal.     Mouth/Throat:     Mouth: Mucous membranes are moist.  Eyes:     Conjunctiva/sclera: Conjunctivae normal.  Cardiovascular:     Rate and Rhythm: Normal rate.   Pulmonary:     Effort: Pulmonary effort is normal. No respiratory distress.  Musculoskeletal:        General: Tenderness present. No swelling or signs of injury.       Back:     Comments: Diffuse low back pain that is tender to palpation.  Lumbar spine is tender to palpation as well, without step-off, crepitus, deformity, bruising or rashes.  Pain induced with range of motion.  Skin:    General: Skin is warm and dry.     Findings: No rash.  Neurological:     Mental Status: She is alert.  Psychiatric:        Mood and Affect: Mood normal.        Behavior: Behavior is cooperative.      UC Treatments / Results  Labs (all labs ordered are listed, but only abnormal results are displayed) Labs Reviewed - No data to display  EKG   Radiology DG Lumbar Spine Complete Result Date: 05/25/2024 CLINICAL DATA:  Persistent back pain after falling on 05/08/2024 EXAM: LUMBAR SPINE - COMPLETE 4 VIEW COMPARISON:  None Available. FINDINGS: Levoconvex scoliosis of the lumbar spine with the apex at L1. Advanced multilevel degenerative disc disease and facet arthropathy throughout the lumbar spine. Evaluation for fracture is very limited by the underlying scoliosis. No convincing evidence of acute fracture by plain radiography. Extensive calcification throughout the abdominal aorta and iliac vessels. Partially imaged right hip arthroplasty. IMPRESSION: 1. No convincing evidence of acute fracture by plain radiography although evaluation is significantly limited by the patient's underlying scoliosis and degenerative changes. If clinical concern persists, MRI of the lumbar spine could further evaluate. 2. Dextroconvex scoliosis with the apex of curvature at L1. 3. Multilevel degenerative disc disease and mid and lower lumbar facet arthropathy. 4. Extensive aortic and iliac arterial vascular calcifications. Electronically Signed   By: Wilkie Lent M.D.   On: 05/25/2024 13:22    Procedures Procedures  (including critical care time)  Medications Ordered in UC Medications - No data to display  Initial Impression / Assessment and Plan / UC Course  I have reviewed the triage vital signs and the nursing notes.  Pertinent labs & imaging results that were available during my care of the patient were reviewed by me and considered in my medical decision making (see chart for details).  Vitals and triage reviewed, patient is hemodynamically stable.  Diffuse lumbar tenderness to palpation, along spine as well.  Without step-off or deformity.  Imaging by my interpretation shows significant arthritis and lumbar scoliosis, no acute bony abnormality.  Confirmed with radiology overread.  Encourage orthopedic follow-up if symptoms persist for further advanced evaluation.  Encouraged lidocaine  patches and symptomatic management.  Can try muscle relaxers with extreme caution, risk and side effects discussed.  Plan of care, follow-up care and return precautions given, no questions at this time.    Final Clinical Impressions(s) / UC Diagnoses   Final diagnoses:  Acute midline low back pain without sciatica  Degeneration of intervertebral  disc of lumbar region with discogenic back pain and lower extremity pain  Scoliosis of lumbar spine, unspecified scoliosis type     Discharge Instructions      Radiology does not see any acute bony abnormality is of the spine but does show degenerative disc disease, arthritis and lumbar scoliosis. Start using lidocaine  patches to the area to help provide pain relief and continue with Tylenol .  Try the muscle relaxer.  He this medication is known to cause sedation and drowsiness so do not drink alcohol or drive on it.  Use extreme caution when walking, as you are already at higher risk for falls.  Warm compresses and gentle stretching can continue to help loosen up the stiff muscles.  Follow-up with orthopedics as needed and continue with physical  therapy.      ED Prescriptions     Medication Sig Dispense Auth. Provider   methocarbamol  (ROBAXIN ) 500 MG tablet Take 1 tablet (500 mg total) by mouth 2 (two) times daily. 10 tablet Dreama, Ladaja Yusupov  N, FNP      PDMP not reviewed this encounter.     [1]  Social History Tobacco Use   Smoking status: Former    Current packs/day: 0.00    Average packs/day: 1 pack/day for 40.0 years (40.0 ttl pk-yrs)    Types: Cigarettes    Start date: 41    Quit date: 2002    Years since quitting: 24.0   Smokeless tobacco: Never  Vaping Use   Vaping status: Never Used  Substance Use Topics   Alcohol use: Not Currently   Drug use: Never     Dreama Israel SAILOR, FNP 05/25/24 1331  "

## 2024-05-25 NOTE — ED Triage Notes (Addendum)
 Pt states she started to fall 05/08/2024 and caught herself she was on pain meds for her previous fall but since pain meds ran out she noticed the back pain is worse. She is alt tylenol , IBU and icy hot patch.   She has been having PT come out they states she is tight in her back . Maybe a muscle relaxer would help  Pt here today with granddaughter

## 2024-05-25 NOTE — Discharge Instructions (Addendum)
 Radiology does not see any acute bony abnormality is of the spine but does show degenerative disc disease, arthritis and lumbar scoliosis. Start using lidocaine  patches to the area to help provide pain relief and continue with Tylenol .  Try the muscle relaxer.  He this medication is known to cause sedation and drowsiness so do not drink alcohol or drive on it.  Use extreme caution when walking, as you are already at higher risk for falls.  Warm compresses and gentle stretching can continue to help loosen up the stiff muscles.  Follow-up with orthopedics as needed and continue with physical therapy.

## 2024-05-29 ENCOUNTER — Emergency Department: Admission: EM | Admit: 2024-05-29 | Discharge: 2024-05-29 | Disposition: A | Discharge status: Home / Self Care

## 2024-05-29 ENCOUNTER — Emergency Department

## 2024-05-29 ENCOUNTER — Other Ambulatory Visit: Payer: Self-pay

## 2024-05-29 DIAGNOSIS — S32030A Wedge compression fracture of third lumbar vertebra, initial encounter for closed fracture: Secondary | ICD-10-CM

## 2024-05-29 DIAGNOSIS — I2510 Atherosclerotic heart disease of native coronary artery without angina pectoris: Secondary | ICD-10-CM | POA: Diagnosis not present

## 2024-05-29 DIAGNOSIS — M5416 Radiculopathy, lumbar region: Secondary | ICD-10-CM | POA: Diagnosis not present

## 2024-05-29 DIAGNOSIS — I10 Essential (primary) hypertension: Secondary | ICD-10-CM | POA: Insufficient documentation

## 2024-05-29 DIAGNOSIS — J449 Chronic obstructive pulmonary disease, unspecified: Secondary | ICD-10-CM | POA: Insufficient documentation

## 2024-05-29 DIAGNOSIS — S3210XA Unspecified fracture of sacrum, initial encounter for closed fracture: Secondary | ICD-10-CM | POA: Insufficient documentation

## 2024-05-29 DIAGNOSIS — S32038A Other fracture of third lumbar vertebra, initial encounter for closed fracture: Secondary | ICD-10-CM | POA: Diagnosis not present

## 2024-05-29 DIAGNOSIS — S3992XA Unspecified injury of lower back, initial encounter: Secondary | ICD-10-CM | POA: Diagnosis present

## 2024-05-29 DIAGNOSIS — Z8673 Personal history of transient ischemic attack (TIA), and cerebral infarction without residual deficits: Secondary | ICD-10-CM | POA: Insufficient documentation

## 2024-05-29 DIAGNOSIS — W1839XA Other fall on same level, initial encounter: Secondary | ICD-10-CM | POA: Diagnosis not present

## 2024-05-29 LAB — URINALYSIS, ROUTINE W REFLEX MICROSCOPIC
Bilirubin Urine: NEGATIVE
Glucose, UA: NEGATIVE mg/dL
Hgb urine dipstick: NEGATIVE
Ketones, ur: NEGATIVE mg/dL
Leukocytes,Ua: NEGATIVE
Nitrite: NEGATIVE
Protein, ur: NEGATIVE mg/dL
Specific Gravity, Urine: 1.004 — ABNORMAL LOW (ref 1.005–1.030)
pH: 6 (ref 5.0–8.0)

## 2024-05-29 MED ORDER — OXYCODONE HCL 5 MG PO TABS
5.0000 mg | ORAL_TABLET | Freq: Once | ORAL | Status: AC
  Administered 2024-05-29: 5 mg via ORAL
  Filled 2024-05-29: qty 1

## 2024-05-29 MED ORDER — OXYCODONE HCL 5 MG PO TABS: 5.0000 mg | ORAL_TABLET | Freq: Two times a day (BID) | ORAL | 0 refills | Status: DC | PRN

## 2024-05-29 MED ORDER — PREDNISONE 10 MG (21) PO TBPK: ORAL_TABLET | ORAL | 0 refills | Status: DC

## 2024-05-29 NOTE — ED Triage Notes (Signed)
 Pt ambulatory with walker to triage with daughter for continued lower back pain since 12/20 when she almost fell and caught herself. Seen at [facility] on 1/6 for same and had xray. Has been taking muscle relaxant, tylenol , lidocaine  patch, and motrin. Pt wears 2L chronic.  Pain is worse with certain movements.

## 2024-05-29 NOTE — ED Notes (Signed)
 Bladder scanned pt has 86ml in bladder, per daughter pt used the restroom to void about one hr ago

## 2024-05-29 NOTE — Progress Notes (Signed)
 Orthopedic Tech Progress Note Patient Details:  Nancy Merritt 01-Sep-1940 [id number] LSO back brace has been ordered from [facility].  [name], female   DOB: 25-Aug-1940, 84 y.o.   MRN: [id number]  Massie FORBES Bar 05/29/2024, 2:08 PM

## 2024-05-29 NOTE — ED Provider Notes (Signed)
 "  [facility] Provider Note    Event Date/Time   First MD Initiated Contact with Patient 05/29/24 1022     (approximate)   History   Back Pain   HPI  Nancy Merritt is a 84 y.o. female with history of hypertension, CAD, COPD, CVA, NSTEMI, thoracic ascending aortic aneurysm, O2 dependent and as listed in EMR presents to the emergency department for treatment and evaluation of lower back pain since 05/08/2024.  At that time she almost fell but caught herself.  She was evaluated at urgent care on 05/25/2024 for the same and x-rays were performed.  She has been taking muscle relaxer, Tylenol , using lidocaine  patches, and taking Motrin.  Pain is worse with any movement or ambulation.      Physical Exam    Vitals:   05/29/24 1010  BP: (!) 156/105  Pulse: 82  Resp: 16  Temp: 97.7 F (36.5 C)  SpO2: 98%    General: Awake, no distress.  CV:  Good peripheral perfusion.  Resp:  Normal effort.  Abd:  No distention.  Other:  Pain in lumbar area with any movement with radiculopathy into anterior thighs bilaterally.   ED Results / Procedures / Treatments   Labs (all labs ordered are listed, but only abnormal results are displayed)  Labs Reviewed  URINALYSIS, ROUTINE W REFLEX MICROSCOPIC - Abnormal; Notable for the following components:      Result Value   Color, Urine STRAW (*)    APPearance CLEAR (*)    Specific Gravity, Urine 1.004 (*)    All other components within normal limits     EKG  Not indicated.   RADIOLOGY  Image and radiology report reviewed and interpreted by me. Radiology report consistent with the same.  MR lumbar spine shows acute to subacute fractures of L3 with compression to 29% loss of height and S3 central sacrum, nondisplaced.  Marrow edema in the L3 posterior element with normal alignment and associated adjacent paraspinal muscle edema and contusions.  Moderate multifactor spinal stenosis at L2-L3 in part related to  retropulsion of L3.  Bulky posterior element degeneration there contributes.  Degenerative change grade 1 anterior listhesis of L4 on L5 with facet arthropathy and degenerative facet ankylosis suspected there and possibly also L5-S1.  Distended urinary bladder, query urinary retention.  PROCEDURES:  Critical Care performed: No  Procedures   MEDICATIONS ORDERED IN ED:  Medications  oxyCODONE  (Oxy IR/ROXICODONE ) immediate release tablet 5 mg (5 mg Oral Given 05/29/24 1111)  oxyCODONE  (Oxy IR/ROXICODONE ) immediate release tablet 5 mg (5 mg Oral Given 05/29/24 1517)     IMPRESSION / MDM / ASSESSMENT AND PLAN / ED COURSE   I have reviewed the triage note and vital signs. Vital signs--hypertensive   Differential diagnosis includes, but is not limited to, lumbar radiculopathy, vertebral injury, musculoskeletal strain, disc degeneration, herniated disc  Patient's presentation is most consistent with acute complicated illness / injury requiring diagnostic workup.  84 year old female presenting to the emergency department for treatment and evaluation of ongoing back pain.  On December 20 she started to fall and an attempt to keep herself from falling she twisted awkwardly and has had back pain since that time.  No new injury.  X-ray images were obtained and were nondiagnostic for acute fracture or concern.  She has had no relief with Tylenol , Advil, or muscle relaxer.  Pain is preventing her from being able to move around or sleep.  No focal neurological deficits on exam.  She does report radicular pain over the anterior thighs bilaterally with straight leg raise.  Plan will be to get an MRI of the lumbar spine today.  She is requesting pain medication.  Oxycodone  ordered and patient was advised not to get up without assistance.  MRI of the lumbar spine shows compression fracture of L3 up to 29% and is 3 (central sacrum, nondisplaced). Results discussed with patient and granddaughter. Also, bladder  appears distended.  Plan will be to place order for bladder scan.   Less than on bladder scan.  Clinical Course as of 05/29/24 1537  Sat May 29, 2024  1508 Consulted with Tiffany in pharmacy regarding patient's listed Advair allergy. Patient has taken prednisone  since this was listed and has had no adverse effect/reaction. Plan will be to discharge home on prednisone  taper and short course of oxycodone  IR. She was advised of the risk for increase risk of fall, syncope, and dizziness. She states that she has to have something stronger than Tylenol  and Advil because she can't take the pain any longer. She was advised to use her walker even in the house and change positions slowly. She is to call and schedule an appointment with neurosurgery. If symptoms change or worsen, she is to return to the ER. [CT]    Clinical Course User Index [CT] Keymiah Lyles B, FNP     FINAL CLINICAL IMPRESSION(S) / ED DIAGNOSES   Final diagnoses:  Compression fracture of L3 lumbar vertebra, closed, initial encounter (HCC)  Closed compression fracture of sacrum, initial encounter (HCC)  Lumbar radiculopathy     Rx / DC Orders   ED Discharge Orders          Ordered    oxyCODONE  (ROXICODONE ) 5 MG immediate release tablet  Every 12 hours PRN        05/29/24 1534    predniSONE  (STERAPRED UNI-PAK 21 TAB) 10 MG (21) TBPK tablet        05/29/24 1534             Note:  This document was prepared using Dragon voice recognition software and may include unintentional dictation errors.   Herlinda Kirk NOVAK, FNP 05/29/24 1537  "

## 2024-05-29 NOTE — Discharge Instructions (Signed)
 Please call and schedule follow-up appointment with the neurosurgeon.  Wear your back brace anytime you are out of bed.  Use your walker at all times.  The pain medication makes you at a higher risk for falling.  You may also feel dizzy or off balance.  Change positions and stand slowly.  You may also consider taking a stool softener if taking the pain medication because it may cause constipation.  Return to the emergency department for any symptoms that change or worsen if you are unable to see your primary care provider or the specialist.

## 2024-05-29 NOTE — ED Notes (Signed)
 Patient taken to imaging.

## 2024-05-31 ENCOUNTER — Telehealth: Payer: Self-pay

## 2024-05-31 NOTE — Telephone Encounter (Signed)
 Agree with this. Thanks.

## 2024-05-31 NOTE — Telephone Encounter (Signed)
 Copied from CRM [id number]. Topic: Clinical - Home Health Verbal Orders >> May 31, 2024 11:13 AM Avram MATSU wrote: Caller/Agency: [phone] secured VM  Callback Number: [facility] Service Requested: Occupational Therapy Frequency: 1*4 Any new concerns about the patient? No

## 2024-06-01 NOTE — Telephone Encounter (Signed)
 Lvm (on secured vm, per message below) informing [facility] that Dr KANDICE is giving verbal orders for services requested for pt.

## 2024-06-07 ENCOUNTER — Ambulatory Visit: Payer: Self-pay

## 2024-06-07 NOTE — Telephone Encounter (Signed)
 FYI Only or Action Required?: FYI only for provider: appointment scheduled on 06/09/24.  Patient was last seen in primary care on 04/30/2024 by Rilla Baller, MD.  Called Nurse Triage reporting Back Pain.  Symptoms began several weeks ago.  Interventions attempted: OTC medications: Tylenol , Ibuprofen, Prescription medications: Lidocaine  patches, Oxycodone , Rest, hydration, or home remedies, and Ice/heat application.  Symptoms are: stable.  Triage Disposition: See PCP When Office is Open (Within 3 Days)  Patient/caregiver understands and will follow disposition?: Yes  Reason for Disposition  [1] MODERATE back pain (e.g., interferes with normal activities) AND [2] present > 3 days  Answer Assessment - Initial Assessment Questions Pt's daughter Josetta calling in today. Seen in ED 05/29/24 for back pain, given oxycodone  and prednisone , has finished prescription. Found around 4 hours of relief with oxycodone . Pt has been advised to see Neurology, appointment not until February 10th.  1. ONSET: When did the pain begin? (e.g., minutes, hours, days)     05/08/24  2. LOCATION: Where does it hurt? (upper, mid or lower back)     Lower back  3. SEVERITY: How bad is the pain?  (e.g., Scale 1-10; mild, moderate, or severe)     Pt's daughter thinks around 6-7/10, worse with movement  4. PATTERN: Is the pain constant? (e.g., yes, no; constant, intermittent)      Constant  5. RADIATION: Does the pain shoot into your legs or somewhere else?     Sometimes goes down both legs  6. MEDICINES: What have you taken so far for the pain? (e.g., nothing, acetaminophen , NSAIDS)     Lidocaine  patches mild relief,  tylenol  / ibuprofen, not helpful, heat therapy.   7. NEUROLOGIC SYMPTOMS: Do you have any weakness, numbness, or problems with bowel/bladder control?     Denies  8. OTHER SYMPTOMS: Do you have any other symptoms? (e.g., fever, abdomen pain, burning with urination, blood in  urine)       Denies  Protocols used: Back Pain-A-AH  Message from Sunol J sent at 06/07/2024 10:38 AM EST  Reason for Triage: back pain,  twisted her back n dec 2025, lower back,

## 2024-06-08 NOTE — Telephone Encounter (Signed)
Will see then. 

## 2024-06-08 NOTE — Telephone Encounter (Signed)
 Per spreadsheet notes pt is no longer on this medication. No PAP needed at this time.

## 2024-06-09 ENCOUNTER — Ambulatory Visit: Admitting: Family Medicine

## 2024-06-09 ENCOUNTER — Encounter: Payer: Self-pay | Admitting: Family Medicine

## 2024-06-09 VITALS — BP 102/66 | HR 84 | Temp 98.0°F | Ht 59.0 in | Wt 130.6 lb

## 2024-06-09 DIAGNOSIS — J411 Mucopurulent chronic bronchitis: Secondary | ICD-10-CM

## 2024-06-09 DIAGNOSIS — I10 Essential (primary) hypertension: Secondary | ICD-10-CM

## 2024-06-09 DIAGNOSIS — E210 Primary hyperparathyroidism: Secondary | ICD-10-CM

## 2024-06-09 DIAGNOSIS — S32030G Wedge compression fracture of third lumbar vertebra, subsequent encounter for fracture with delayed healing: Secondary | ICD-10-CM | POA: Diagnosis not present

## 2024-06-09 DIAGNOSIS — I959 Hypotension, unspecified: Secondary | ICD-10-CM | POA: Diagnosis not present

## 2024-06-09 MED ORDER — GABAPENTIN 100 MG PO CAPS: ORAL_CAPSULE | ORAL | 1 refills | Status: AC

## 2024-06-09 NOTE — Progress Notes (Signed)
 " Ph: [phone] Fax: [phone]   [name], female    DOB: 1940-07-12, 84 y.o.   MRN: [id number]  This visit was conducted in person.  BP 102/66 (BP Location: Right Arm, Cuff Size: Normal)   Pulse 84   Temp 98 F (36.7 C) (Oral)   Ht 4' 11 (1.499 m)   Wt 130 lb 9.6 oz (59.2 kg)   SpO2 98%   BMI 26.38 kg/m   BP Readings from Last 3 Encounters:  06/09/24 102/66  05/29/24 (!) 196/90  05/25/24 (!) 171/96    CC: ER f/u visit  Subjective:   HPI: Nancy Merritt is a 84 y.o. female presenting on 06/09/2024 for Hospitalization Follow-up (ED FU 1/10/Compression fracture of L3 lumbar vertebra,/ Oxycodone  given for pain but has used it up already, pt is requesting more at this time as well as a stool softener as she states the medications constipate her/Pt is wearing back brace//7/10 pain score, pt states she is extremely sleep deprived, doing better since hsp but not where she needs to be//Michael, Pt's SIL is in room//)   Last seen 04/2024 - after fall at home with R posterior 6/7th rib fractures complicated by traumatic pneumothorax.   Recurrent near fall 05/08/2024 - twisted back. Progressive worsening of lower back pain so seen at [facility] 05/25/2024 with reassuring lumbar xrays at that time. Treated with robaxin  muscle relaxant.   Seen again at ER 05/29/2024 due to ongoing pain, MRI showed acute to subacute fractures at L3 and S3 (central, nondisplaced). Treated with prednisone  taper, and short oxycodone  course with neurosurgery f/u planned (06/29/2024). She has also started using lumbar support brace since ER. States oxycodone  hasn't really helped, only caused constipation. She also tried ES tylenol  1000mg , tylenol  PM, advil 800mg  at a time, tramadol  and robaxin  without benefit.  Currently pain 6/10, at its worse 9/10, worst at night.  Pain affecting sleep. Describes burning and tingling R>L legs (hip to ankle).  No hip pain, no planned dental work.   COPD - on  Yupelri  (revefenacin ), daliresp , arformoterol  nebulizer and pulmicort  nebulizer, and duonebs q6hr. She has not been using supplemental oxygen  regularly, just occ during the day. Very stable from respiratory standpoint. Sees pulm Dr Isaiah. Recommended smart vest therapy 15 min BID to help expectorate mucous.   Has had difficulty tolerating oxycodone  in the past.  Previous trial tramadol  was not as effect  Unsure if has had DEXA, previously declined.  Endorses good dietary calcium  intake. She also continues vitamin D3 2000 units daily.  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  Lab Results  Component Value Date   VD25OH 34.11 06/09/2024     Lab Results  Component Value Date   PTH 52 06/09/2024   CALCIUM  10.5 06/09/2024   CAION 1.31 05/08/2019   PHOS 3.1 06/20/2020       Relevant past medical, surgical, family and social history reviewed and updated as indicated. Interim medical history since our last visit reviewed. Allergies and medications reviewed and updated. Outpatient Medications Prior to Visit  Medication Sig Dispense Refill   acetaminophen  (TYLENOL ) 500 MG tablet Take 1 tablet (500 mg total) by mouth 3 (three) times daily as needed for moderate pain.     albuterol  (VENTOLIN  HFA) 108 (90 Base) MCG/ACT inhaler INHALE 2 PUFFS INTO THE LUNGS EVERY 4 HOURS AS NEEDED FOR WHEEZING OR SHORTNESS OF BREATH. 6.7 each 3   arformoterol  (BROVANA ) 15 MCG/2ML NEBU Take 2 mLs (15  mcg total) by nebulization 2 (two) times daily. 120 mL 12   aspirin  81 MG chewable tablet Chew 1 tablet (81 mg total) by mouth daily.     budesonide  (PULMICORT ) 0.5 MG/2ML nebulizer solution Take 2 mLs (0.5 mg total) by nebulization 2 (two) times daily. 1440 mL 0   carvedilol  (COREG ) 6.25 MG tablet TAKE 1 TABLET BY MOUTH 2 TIMES DAILY WITH A MEAL. 180 tablet 3   Cholecalciferol  (VITAMIN D3) 25 MCG (1000 UT) capsule Take 2 capsules (2,000 Units total) by mouth daily. 30 capsule    clopidogrel  (PLAVIX ) 75  MG tablet Take 1 tablet (75 mg total) by mouth daily with breakfast. 90 tablet 3   ezetimibe  (ZETIA ) 10 MG tablet TAKE 1 TABLET BY MOUTH EVERY DAY 90 tablet 0   fluticasone  (FLONASE ) 50 MCG/ACT nasal spray Place 2 sprays into both nostrils daily. 16 g 12   hydroxyurea  (HYDREA ) 500 MG capsule Take 3 capsules (1,500 mg total) by mouth daily. May take with food to minimize GI side effects. 270 capsule 2   lidocaine  (LIDODERM ) 5 % Place 1 patch onto the skin daily. Remove & Discard patch within 12 hours or as directed by MD     loratadine  (CLARITIN ) 10 MG tablet Take 10 mg by mouth daily.     Multiple Vitamin (MULTIVITAMIN) tablet Take 1 tablet by mouth daily.     Multiple Vitamins-Minerals (PRESERVISION AREDS 2) CAPS Take 2 each by mouth daily at 12 noon.     nitroGLYCERIN  (NITROSTAT ) 0.4 MG SL tablet Place 1 tablet (0.4 mg total) under the tongue every 5 (five) minutes as needed for chest pain. 25 tablet 3   roflumilast  (DALIRESP ) 500 MCG TABS tablet TAKE 1 TABLET BY MOUTH DAILY 90 tablet 3   rosuvastatin  (CRESTOR ) 20 MG tablet Take 1 tablet (20 mg total) by mouth at bedtime. 90 tablet 3   sacubitril -valsartan  (ENTRESTO ) 24-26 MG Take 1 tablet by mouth 2 (two) times daily.     sertraline  (ZOLOFT ) 100 MG tablet Take 1 tablet (100 mg total) by mouth daily. 90 tablet 4   azithromycin  (ZITHROMAX ) 500 MG tablet Take 1 tablet (500 mg total) by mouth daily. 3 tablet 0   revefenacin  (YUPELRI ) 175 MCG/3ML nebulizer solution Take 3 mLs (175 mcg total) by nebulization daily. (Patient not taking: Reported on 06/09/2024) 90 mL 0   methocarbamol  (ROBAXIN ) 500 MG tablet Take 1 tablet (500 mg total) by mouth 2 (two) times daily. (Patient not taking: Reported on 06/09/2024) 10 tablet 0   oxyCODONE  (ROXICODONE ) 5 MG immediate release tablet Take 1 tablet (5 mg total) by mouth every 12 (twelve) hours as needed. (Patient not taking: Reported on 06/09/2024) 12 tablet 0   predniSONE  (STERAPRED UNI-PAK 21 TAB) 10 MG (21) TBPK  tablet Take 6 tablets on the first day and decrease by 1 tablet each day until finished. (Patient not taking: Reported on 06/09/2024) 21 tablet 0   No facility-administered medications prior to visit.     Per HPI unless specifically indicated in ROS section below Review of Systems  Objective:  BP 102/66 (BP Location: Right Arm, Cuff Size: Normal)   Pulse 84   Temp 98 F (36.7 C) (Oral)   Ht 4' 11 (1.499 m)   Wt 130 lb 9.6 oz (59.2 kg)   SpO2 98%   BMI 26.38 kg/m   Wt Readings from Last 3 Encounters:  06/09/24 130 lb 9.6 oz (59.2 kg)  05/29/24 129 lb (58.5 kg)  04/30/24 138 lb (62.6  kg)      Physical Exam Vitals and nursing note reviewed.  Constitutional:      Appearance: Normal appearance. She is not ill-appearing.  HENT:     Head: Normocephalic and atraumatic.     Ears:     Comments: HOH - wearing hearing aides    Mouth/Throat:     Mouth: Mucous membranes are moist.     Pharynx: Oropharynx is clear. No oropharyngeal exudate or posterior oropharyngeal erythema.  Eyes:     Extraocular Movements: Extraocular movements intact.     Pupils: Pupils are equal, round, and reactive to light.  Cardiovascular:     Rate and Rhythm: Normal rate and regular rhythm.     Pulses: Normal pulses.     Heart sounds: Normal heart sounds. No murmur heard. Pulmonary:     Effort: Pulmonary effort is normal. No respiratory distress.     Breath sounds: Normal breath sounds. No wheezing, rhonchi or rales.  Musculoskeletal:     Right lower leg: No edema.     Left lower leg: No edema.     Comments: Wearing lumbar brace   Skin:    General: Skin is warm and dry.     Findings: No rash.  Neurological:     Mental Status: She is alert.       Results for orders placed or performed in visit on 06/09/24  Basic metabolic panel with GFR   Collection Time: 06/09/24  2:50 PM  Result Value Ref Range   Sodium 135 135 - 145 mEq/L   Potassium 4.1 3.5 - 5.1 mEq/L   Chloride 104 96 - 112 mEq/L   CO2  24 19 - 32 mEq/L   Glucose, Bld 114 (H) 70 - 99 mg/dL   BUN 23 6 - 23 mg/dL   Creatinine, Ser 9.12 0.40 - 1.20 mg/dL   GFR 39.20 >39.99 mL/min   Calcium  10.5 8.4 - 10.5 mg/dL  Parathyroid  hormone, intact (no Ca)   Collection Time: 06/09/24  2:50 PM  Result Value Ref Range   PTH 52 16 - 77 pg/mL  VITAMIN D  25 Hydroxy (Vit-D Deficiency, Fractures)   Collection Time: 06/09/24  2:50 PM  Result Value Ref Range   VITD 34.11 30.00 - 100.00 ng/mL    Lab Results  Component Value Date   TSH 2.21 01/31/2022    Lab Results  Component Value Date   WBC 5.7 04/21/2024   HGB 11.5 (L) 04/21/2024   HCT 35.5 (L) 04/21/2024   MCV 105.7 (H) 04/21/2024   PLT 523 (H) 04/21/2024    Assessment & Plan:   Problem List Items Addressed This Visit     Hypertension   Today BPs actually low - see below.       Primary hyperparathyroidism   H/o borderline elevated PTH and calcium , previously referred to endocrinology but unsure if ever seen  Avoid oral calcium  for this reason - encourage good dietary calcium  intake.  Update Ca, PTH, results will guide further OP treatment.  Doubt surgical candidate.       COPD (chronic obstructive pulmonary disease) (HCC)   Appreciate pulmonology care Overall stable period on yupelri , daliresp , arfomoterol nebulizer, pulmicort  nebulizer, duonebs       Closed compression fracture of third lumbar vertebra (HCC) - Primary   Fracture likely sustained mid December, now with trouble achieving pain relief despite several meds tried.  Opiate now effective and was causing constipation - will stay off at this time.  Will trial gabapentin  for  possible neuropathic component.  Avoiding prednisone .  Recommend baseline DEXA to assess degree of osteoporosis.  Consider bisphosphonate vs nasal calcitonin pending lab results and gabapentin  pain relief effect.       Relevant Orders   Basic metabolic panel with GFR (Completed)   Parathyroid  hormone, intact (no Ca) (Completed)    VITAMIN D  25 Hydroxy (Vit-D Deficiency, Fractures) (Completed)   DG Bone Density   Hypotension   Low BPs noted today.  She is on carvedilol  6.25mg  bid, and entresto  24/26 bid - recent decrease in dose.  Encouraged good hydration, rec monitor BP at home and if consistently low to let us  or cardiology know for med titration.         Meds ordered this encounter  Medications   gabapentin  (NEURONTIN ) 100 MG capsule    Sig: Take 1 capsule (100 mg total) by mouth at bedtime for 3 days, THEN 2 capsules (200 mg total) at bedtime for 3 days, THEN 3 capsules (300 mg total) at bedtime.    Dispense:  90 capsule    Refill:  1    Orders Placed This Encounter  Procedures   DG Bone Density    Standing Status:   Future    Expiration Date:   06/09/2025    Reason for Exam (SYMPTOM  OR DIAGNOSIS REQUIRED):   multiple fractures, L3 compression and sacral    Preferred imaging location?:   [facility]   Basic metabolic panel with GFR   Parathyroid  hormone, intact (no Ca)   VITAMIN D  25 Hydroxy (Vit-D Deficiency, Fractures)    Patient Instructions  Labs today  Call to to schedule bone density scan at [facility] [phone] Start gabapentin  100mg  nightly, if tolerated well, may increase to 200mg  after 3 days and then 300mg  at night after another 3 days if needed.  We may start bone strengthening medicine that may help pain control pending lab results.    Follow up plan: Return if symptoms worsen or fail to improve.  Anton Blas, MD   "

## 2024-06-09 NOTE — Patient Instructions (Addendum)
 Labs today  Call to to schedule bone density scan at [facility] [phone] Start gabapentin  100mg  nightly, if tolerated well, may increase to 200mg  after 3 days and then 300mg  at night after another 3 days if needed.  We may start bone strengthening medicine that may help pain control pending lab results.

## 2024-06-10 LAB — VITAMIN D 25 HYDROXY (VIT D DEFICIENCY, FRACTURES): VITD: 34.11 ng/mL (ref 30.00–100.00)

## 2024-06-10 LAB — BASIC METABOLIC PANEL WITH GFR
BUN: 23 mg/dL (ref 6–23)
CO2: 24 meq/L (ref 19–32)
Calcium: 10.5 mg/dL (ref 8.4–10.5)
Chloride: 104 meq/L (ref 96–112)
Creatinine, Ser: 0.87 mg/dL (ref 0.40–1.20)
GFR: 60.79 mL/min
Glucose, Bld: 114 mg/dL — ABNORMAL HIGH (ref 70–99)
Potassium: 4.1 meq/L (ref 3.5–5.1)
Sodium: 135 meq/L (ref 135–145)

## 2024-06-10 LAB — PARATHYROID HORMONE, INTACT (NO CA): PTH: 52 pg/mL (ref 16–77)

## 2024-06-14 ENCOUNTER — Ambulatory Visit: Payer: Self-pay | Admitting: Family Medicine

## 2024-06-14 DIAGNOSIS — S32030A Wedge compression fracture of third lumbar vertebra, initial encounter for closed fracture: Secondary | ICD-10-CM | POA: Insufficient documentation

## 2024-06-14 DIAGNOSIS — I959 Hypotension, unspecified: Secondary | ICD-10-CM | POA: Insufficient documentation

## 2024-06-14 NOTE — Assessment & Plan Note (Addendum)
 Appreciate pulmonology care Overall stable period on yupelri , daliresp , arfomoterol nebulizer, pulmicort  nebulizer, duonebs

## 2024-06-14 NOTE — Assessment & Plan Note (Signed)
 Low BPs noted today.  She is on carvedilol  6.25mg  bid, and entresto  24/26 bid - recent decrease in dose.  Encouraged good hydration, rec monitor BP at home and if consistently low to let us  or cardiology know for med titration.

## 2024-06-14 NOTE — Assessment & Plan Note (Signed)
 Today BPs actually low - see below.

## 2024-06-14 NOTE — Assessment & Plan Note (Addendum)
 H/o borderline elevated PTH and calcium , previously referred to endocrinology but unsure if ever seen  Avoid oral calcium  for this reason - encourage good dietary calcium  intake.  Update Ca, PTH, results will guide further OP treatment.  Doubt surgical candidate.

## 2024-06-14 NOTE — Assessment & Plan Note (Addendum)
 Fracture likely sustained mid December, now with trouble achieving pain relief despite several meds tried.  Opiate now effective and was causing constipation - will stay off at this time.  Will trial gabapentin  for possible neuropathic component.  Avoiding prednisone .  Recommend baseline DEXA to assess degree of osteoporosis.  Consider bisphosphonate vs nasal calcitonin pending lab results and gabapentin  pain relief effect.

## 2024-06-16 NOTE — Telephone Encounter (Signed)
 Copied from CRM [id number]. Topic: Clinical - Lab/Test Results >> Jun 16, 2024  3:04 PM Deleta RAMAN wrote: Reason for CRM: How did gabapentin  do for pain control? What dose is she currently on? Gabapentin  is currently working/helping. She is taking 3 pills daily If ongoing pain, would offer adding medicine called calcitonin (daily nasal spray) for a few months.  How have blood pressures been running? She is unsure of mothers blood pressure. The patient takes It everyday and does not complain of an issue.

## 2024-06-17 NOTE — Telephone Encounter (Unsigned)
 Copied from CRM [id number]. Topic: Clinical - Lab/Test Results >> Jun 16, 2024  3:04 PM Deleta RAMAN wrote: Reason for CRM: How did gabapentin  do for pain control? What dose is she currently on? Gabapentin  is currently working/helping. She is taking 3 pills daily If ongoing pain, would offer adding medicine called calcitonin (daily nasal spray) for a few months.  How have blood pressures been running? She is unsure of mothers blood pressure. The patient takes It everyday and does not complain of an issue. >> Jun 17, 2024 12:34 PM Robinson H wrote: Patients daughter Josetta returning call stating that she mis-spoke yesterday. States patient is still dealing with some pain and would like to try the medicine called calcitonin (daily nasal spray) that Dr. Rilla offered.  [place] [phone]

## 2024-06-18 ENCOUNTER — Ambulatory Visit: Payer: Self-pay | Admitting: Family Medicine

## 2024-06-18 DIAGNOSIS — S32030G Wedge compression fracture of third lumbar vertebra, subsequent encounter for fracture with delayed healing: Secondary | ICD-10-CM

## 2024-06-18 MED ORDER — CALCITONIN (SALMON) 200 UNIT/ACT NA SOLN: 1.0000 | Freq: Every day | NASAL | 2 refills | Status: AC

## 2024-06-18 NOTE — Telephone Encounter (Signed)
 See subsequent phone note

## 2024-06-18 NOTE — Addendum Note (Signed)
 Addended by: RILLA BALLER on: 06/18/2024 05:13 PM   Modules accepted: Orders

## 2024-06-18 NOTE — Telephone Encounter (Signed)
 FYI Only or Action Required?: Action required by provider: Medication Request.  Patient was last seen in primary care on 06/09/2024 by Rilla Baller, MD.  Called Nurse Triage reporting No chief complaint on file..   Triage Disposition: See PCP When Office is Open (Within 3 Days)  Patient/caregiver understands and will follow disposition?: No, wishes to speak with PCP   Message from Pojoaque G sent at 06/18/2024 11:49 AM EST  Reason for Triage: Pt daughter stated that mom is still experiencing pain in lower back. Pt daughter is requesting the medication that doctor recommended.   Reason for Disposition  Prescription request for new medicine (not a refill)  Answer Assessment - Initial Assessment Questions 1. NAME of MEDICINE: What medicine(s) are you calling about?     Pt's daughter states pt is still having lower back pain and was told by PCP there would be another medication prescribed if pain persists.   Pt's daughter states pt is still uncomfortable and is requesting calcitonin (daily nasal spray) for a few months as told by PCP.  Routing to clinic to assist with pt's daughter's request.  Protocols used: Medication Question Call-A-AH

## 2024-06-22 NOTE — Telephone Encounter (Signed)
 Duplicate encounter

## 2024-06-23 NOTE — Progress Notes (Unsigned)
 "  Referring Physician:  Rilla Baller, MD [address] [place],  [place] [address]  Primary Physician:  Nancy Baller, MD  History of Present Illness: 06/23/2024 [name] is here today with a chief complaint of ***  lower back pain  fracture  Duration: *** Location: *** Quality: *** Severity: ***  Precipitating: aggravated by *** Modifying factors: made better by *** Weakness: none Timing: *** Bowel/Bladder Dysfunction: none  Conservative measures:  Physical therapy: *** Has not participated in? Multimodal medical therapy including regular antiinflammatories: *** Tylenol , Lidocaine , Motrin, Prednisone , oxycodone   Injections: *** epidural steroid injections?  Past Surgery: ***  Nancy Merritt has ***no symptoms of cervical myelopathy.  The symptoms are causing a significant impact on the patient's life.   Review of Systems:  A 10 point review of systems is negative, except for the pertinent positives and negatives detailed in the HPI.  Past Medical History: Past Medical History:  Diagnosis Date   Actinic keratosis    Arthritis    Asthma    Basal cell carcinoma 08/18/2023   right medial pretibial, [facility]   Cancer (HCC) 1990   CHF (congestive heart failure) (HCC)    COPD (chronic obstructive pulmonary disease) (HCC)    Coronary artery disease    COVID-19 06/14/2020   Depression    Hx of breast cancer 04/07/2018   1990. No screening in last 25 years   Hypertension     Past Surgical History: Past Surgical History:  Procedure Laterality Date   BREAST SURGERY Left 1990   CORONARY ARTERY BYPASS GRAFT     JOINT REPLACEMENT Right    Hip   LEFT HEART CATH AND CORS/GRAFTS ANGIOGRAPHY N/A 06/05/2022   Procedure: LEFT HEART CATH AND CORS/GRAFTS ANGIOGRAPHY;  Surgeon: Darron Deatrice LABOR, MD;  Location: [facility];  Service: Cardiovascular;  Laterality: N/A;   OVARIAN CYST REMOVAL  1970   TUBAL LIGATION  1970     Allergies: Allergies as of 06/29/2024 - Review Complete 06/09/2024  Allergen Reaction Noted   Advair diskus [fluticasone -salmeterol] Shortness Of Breath 05/26/2018   Bee pollen Other (See Comments) 09/16/2023   Budesonide -formoterol fumarate Other (See Comments) 05/27/2018   Levofloxacin Other (See Comments) 05/27/2018   Pollen extract Cough 09/16/2023   Qvar [beclomethasone] Other (See Comments) 05/27/2018   Singulair [montelukast] Shortness Of Breath 05/26/2018    Medications: Outpatient Encounter Medications as of 06/29/2024  Medication Sig   acetaminophen  (TYLENOL ) 500 MG tablet Take 1 tablet (500 mg total) by mouth 3 (three) times daily as needed for moderate pain.   albuterol  (VENTOLIN  HFA) 108 (90 Base) MCG/ACT inhaler INHALE 2 PUFFS INTO THE LUNGS EVERY 4 HOURS AS NEEDED FOR WHEEZING OR SHORTNESS OF BREATH.   arformoterol  (BROVANA ) 15 MCG/2ML NEBU Take 2 mLs (15 mcg total) by nebulization 2 (two) times daily.   aspirin  81 MG chewable tablet Chew 1 tablet (81 mg total) by mouth daily.   budesonide  (PULMICORT ) 0.5 MG/2ML nebulizer solution Take 2 mLs (0.5 mg total) by nebulization 2 (two) times daily.   calcitonin, salmon, (MIACALCIN/FORTICAL) 200 UNIT/ACT nasal spray Place 1 spray into alternate nostrils daily.   carvedilol  (COREG ) 6.25 MG tablet TAKE 1 TABLET BY MOUTH 2 TIMES DAILY WITH A MEAL.   Cholecalciferol  (VITAMIN D3) 25 MCG (1000 UT) capsule Take 2 capsules (2,000 Units total) by mouth daily.   clopidogrel  (PLAVIX ) 75 MG tablet Take 1 tablet (75 mg total) by mouth daily with breakfast.   ezetimibe  (ZETIA ) 10 MG tablet TAKE  1 TABLET BY MOUTH EVERY DAY   fluticasone  (FLONASE ) 50 MCG/ACT nasal spray Place 2 sprays into both nostrils daily.   gabapentin  (NEURONTIN ) 100 MG capsule Take 1 capsule (100 mg total) by mouth at bedtime for 3 days, THEN 2 capsules (200 mg total) at bedtime for 3 days, THEN 3 capsules (300 mg total) at bedtime.   hydroxyurea  (HYDREA ) 500 MG capsule  Take 3 capsules (1,500 mg total) by mouth daily. May take with food to minimize GI side effects.   lidocaine  (LIDODERM ) 5 % Place 1 patch onto the skin daily. Remove & Discard patch within 12 hours or as directed by MD   loratadine  (CLARITIN ) 10 MG tablet Take 10 mg by mouth daily.   Multiple Vitamin (MULTIVITAMIN) tablet Take 1 tablet by mouth daily.   Multiple Vitamins-Minerals (PRESERVISION AREDS 2) CAPS Take 2 each by mouth daily at 12 noon.   nitroGLYCERIN  (NITROSTAT ) 0.4 MG SL tablet Place 1 tablet (0.4 mg total) under the tongue every 5 (five) minutes as needed for chest pain.   revefenacin  (YUPELRI ) 175 MCG/3ML nebulizer solution Take 3 mLs (175 mcg total) by nebulization daily. (Patient not taking: Reported on 06/09/2024)   roflumilast  (DALIRESP ) 500 MCG TABS tablet TAKE 1 TABLET BY MOUTH DAILY   rosuvastatin  (CRESTOR ) 20 MG tablet Take 1 tablet (20 mg total) by mouth at bedtime.   sacubitril -valsartan  (ENTRESTO ) 24-26 MG Take 1 tablet by mouth 2 (two) times daily.   sertraline  (ZOLOFT ) 100 MG tablet Take 1 tablet (100 mg total) by mouth daily.   No facility-administered encounter medications on file as of 06/29/2024.    Social History: Social History[1]  Family Medical History: Family History  Problem Relation Age of Onset   Cancer Father        lung   Heart attack Father 28   Alcohol abuse Father    Arthritis Father    COPD Father    Diabetes Father    Hypertension Father    Breast cancer Sister 74   Arthritis Sister    Cancer Sister    COPD Sister    Drug abuse Sister    Hypertension Sister    Miscarriages / Stillbirths Sister    Thyroid  disease Sister    Arthritis Mother    COPD Mother    Diabetes Mother    Hearing loss Mother    Hypertension Mother    Miscarriages / Stillbirths Mother    Stroke Mother    Alcohol abuse Brother    Arthritis Brother    Hypertension Brother    Arthritis Daughter    Asthma Daughter    Depression Daughter    Hypertension  Daughter    Miscarriages / Stillbirths Daughter    Thyroid  disease Daughter    Arthritis Maternal Grandmother    Hypertension Maternal Grandmother    Stroke Maternal Grandmother    Thyroid  disease Maternal Grandmother    Alcohol abuse Maternal Grandfather    Arthritis Maternal Grandfather    Cancer Maternal Grandfather    Depression Maternal Grandfather    Hearing loss Maternal Grandfather    Hypertension Maternal Grandfather    Arthritis Sister    Thyroid  disease Sister    Thyroid  disease Daughter    Hypertension Daughter    Depression Daughter    COPD Daughter    Alcohol abuse Daughter    Arthritis Daughter    Arthritis Daughter    COPD Daughter    Depression Daughter    Hearing loss Daughter  Hypertension Daughter    Thyroid  disease Daughter     Physical Examination: @VITALWITHPAIN @  General: Patient is well developed, well nourished, calm, collected, and in no apparent distress. Attention to examination is appropriate.  Psychiatric: Patient is non-anxious.  Head:  Pupils equal, round, and reactive to light.  ENT:  Oral mucosa appears well hydrated.  Neck:   Supple.  ***Full range of motion.  Respiratory: Patient is breathing without any difficulty.  Extremities: No edema.  Vascular: Palpable dorsal pedal pulses.  Skin:   On exposed skin, there are no abnormal skin lesions.  NEUROLOGICAL:     Awake, alert, oriented to person, place, and time.  Speech is clear and fluent. Fund of knowledge is appropriate.   Cranial Nerves: Pupils equal round and reactive to light.  Facial tone is symmetric.  Facial sensation is symmetric.  ROM of spine: ***full.  Palpation of spine: ***non tender.    Strength: Side Biceps Triceps Deltoid Interossei Grip Wrist Ext. Wrist Flex.  R 5 5 5 5 5 5 5   L 5 5 5 5 5 5 5    Side Iliopsoas Quads Hamstring PF DF EHL  R 5 5 5 5 5 5   L 5 5 5 5 5 5    Reflexes are ***2+ and symmetric at the biceps, triceps, brachioradialis, patella  and achilles.   Hoffman's is absent.  Clonus is not present.  Toes are down-going.  Bilateral upper and lower extremity sensation is intact to light touch.    Gait is normal.   No difficulty with tandem gait.   No evidence of dysmetria noted.  Medical Decision Making  Imaging: ***  I have personally reviewed the images and agree with the above interpretation.  Assessment and Plan: Ms. Ewald is a pleasant 84 y.o. female with ***    Thank you for involving me in the care of this patient.   I spent a total of *** minutes in both face-to-face and non-face-to-face activities for this visit on the date of this encounter.   Lyle Decamp, PA-C Dept. of Neurosurgery     [1]  Social History Tobacco Use   Smoking status: Former    Current packs/day: 0.00    Average packs/day: 1 pack/day for 40.0 years (40.0 ttl pk-yrs)    Types: Cigarettes    Start date: 23    Quit date: 2002    Years since quitting: 24.1   Smokeless tobacco: Never  Vaping Use   Vaping status: Never Used  Substance Use Topics   Alcohol use: Not Currently   Drug use: Never   "

## 2024-06-25 ENCOUNTER — Other Ambulatory Visit: Payer: Self-pay | Admitting: Family Medicine

## 2024-06-29 ENCOUNTER — Other Ambulatory Visit

## 2024-06-29 ENCOUNTER — Ambulatory Visit: Admitting: Physician Assistant

## 2024-07-01 ENCOUNTER — Other Ambulatory Visit

## 2024-07-01 ENCOUNTER — Ambulatory Visit: Admitting: Physician Assistant

## 2024-07-01 ENCOUNTER — Ambulatory Visit: Payer: PPO

## 2024-07-02 ENCOUNTER — Ambulatory Visit

## 2024-07-29 ENCOUNTER — Other Ambulatory Visit

## 2024-08-09 ENCOUNTER — Other Ambulatory Visit

## 2024-08-16 ENCOUNTER — Encounter: Admitting: Family Medicine

## 2024-09-29 ENCOUNTER — Inpatient Hospital Stay: Admitting: Oncology

## 2024-09-29 ENCOUNTER — Inpatient Hospital Stay

## 2024-09-30 ENCOUNTER — Inpatient Hospital Stay: Admitting: Oncology

## 2024-09-30 ENCOUNTER — Inpatient Hospital Stay

## 2024-10-07 ENCOUNTER — Ambulatory Visit

## 2025-01-07 ENCOUNTER — Encounter (INDEPENDENT_AMBULATORY_CARE_PROVIDER_SITE_OTHER): Admitting: Ophthalmology

## 2025-02-16 ENCOUNTER — Ambulatory Visit: Admitting: Dermatology
